# Patient Record
Sex: Female | Born: 1950
Health system: Southern US, Community
[De-identification: ages and names within clinical notes are randomized; demographics above are authoritative.]

## PROBLEM LIST (undated history)

## (undated) DIAGNOSIS — K5730 Diverticulosis of large intestine without perforation or abscess without bleeding: Secondary | ICD-10-CM

## (undated) DIAGNOSIS — K219 Gastro-esophageal reflux disease without esophagitis: Secondary | ICD-10-CM

## (undated) DIAGNOSIS — R739 Hyperglycemia, unspecified: Secondary | ICD-10-CM

## (undated) DIAGNOSIS — H269 Unspecified cataract: Secondary | ICD-10-CM

## (undated) DIAGNOSIS — M1990 Unspecified osteoarthritis, unspecified site: Secondary | ICD-10-CM

## (undated) DIAGNOSIS — G4730 Sleep apnea, unspecified: Secondary | ICD-10-CM

## (undated) DIAGNOSIS — J45991 Cough variant asthma: Secondary | ICD-10-CM

## (undated) DIAGNOSIS — M810 Age-related osteoporosis without current pathological fracture: Secondary | ICD-10-CM

## (undated) DIAGNOSIS — J302 Other seasonal allergic rhinitis: Secondary | ICD-10-CM

## (undated) DIAGNOSIS — I10 Essential (primary) hypertension: Secondary | ICD-10-CM

## (undated) DIAGNOSIS — E789 Disorder of lipoprotein metabolism, unspecified: Secondary | ICD-10-CM

## (undated) HISTORY — DX: Unspecified cataract: H26.9

## (undated) HISTORY — DX: Diverticulosis of large intestine without perforation or abscess without bleeding: K57.30

## (undated) HISTORY — DX: Disorder of lipoprotein metabolism, unspecified: E78.9

## (undated) HISTORY — PX: COLPOSCOPY: SHX161

## (undated) HISTORY — DX: Age-related osteoporosis without current pathological fracture: M81.0

## (undated) HISTORY — DX: Hyperglycemia, unspecified: R73.9

## (undated) HISTORY — DX: Other seasonal allergic rhinitis: J30.2

## (undated) HISTORY — DX: Gastro-esophageal reflux disease without esophagitis: K21.9

## (undated) HISTORY — DX: Unspecified osteoarthritis, unspecified site: M19.90

## (undated) HISTORY — DX: Sleep apnea, unspecified: G47.30

## (undated) HISTORY — PX: TUBAL LIGATION: SHX77

## (undated) HISTORY — PX: COLONOSCOPY: SHX174

## (undated) HISTORY — DX: Essential (primary) hypertension: I10

## (undated) HISTORY — PX: ROTATOR CUFF REPAIR: SHX139

## (undated) HISTORY — DX: Cough variant asthma: J45.991

---

## 1998-05-12 ENCOUNTER — Ambulatory Visit (HOSPITAL_BASED_OUTPATIENT_CLINIC_OR_DEPARTMENT_OTHER): Admission: RE | Admit: 1998-05-12 | Discharge: 1998-05-12 | Payer: Self-pay | Admitting: Orthopaedic Surgery

## 1998-09-02 ENCOUNTER — Other Ambulatory Visit: Admission: RE | Admit: 1998-09-02 | Discharge: 1998-09-02 | Payer: Self-pay | Admitting: Family Medicine

## 1999-10-13 ENCOUNTER — Other Ambulatory Visit: Admission: RE | Admit: 1999-10-13 | Discharge: 1999-10-13 | Payer: Self-pay | Admitting: Family Medicine

## 1999-10-19 ENCOUNTER — Encounter: Admission: RE | Admit: 1999-10-19 | Discharge: 1999-10-19 | Payer: Self-pay | Admitting: Family Medicine

## 1999-10-19 ENCOUNTER — Encounter: Payer: Self-pay | Admitting: Family Medicine

## 1999-11-15 HISTORY — PX: ESOPHAGOGASTRODUODENOSCOPY: SHX1529

## 1999-11-30 LAB — HM COLONOSCOPY

## 2001-01-09 ENCOUNTER — Encounter: Payer: Self-pay | Admitting: Family Medicine

## 2001-01-09 ENCOUNTER — Other Ambulatory Visit: Admission: RE | Admit: 2001-01-09 | Discharge: 2001-01-09 | Payer: Self-pay | Admitting: Family Medicine

## 2001-01-09 ENCOUNTER — Encounter: Admission: RE | Admit: 2001-01-09 | Discharge: 2001-01-09 | Payer: Self-pay | Admitting: Family Medicine

## 2001-06-02 ENCOUNTER — Emergency Department (HOSPITAL_COMMUNITY): Admission: EM | Admit: 2001-06-02 | Discharge: 2001-06-02 | Payer: Self-pay

## 2002-01-11 ENCOUNTER — Other Ambulatory Visit: Admission: RE | Admit: 2002-01-11 | Discharge: 2002-01-11 | Payer: Self-pay | Admitting: Family Medicine

## 2002-09-26 ENCOUNTER — Encounter: Admission: RE | Admit: 2002-09-26 | Discharge: 2002-09-26 | Payer: Self-pay | Admitting: Family Medicine

## 2002-09-26 ENCOUNTER — Encounter: Payer: Self-pay | Admitting: Family Medicine

## 2003-09-18 ENCOUNTER — Encounter: Payer: Self-pay | Admitting: Family Medicine

## 2003-09-18 ENCOUNTER — Other Ambulatory Visit: Admission: RE | Admit: 2003-09-18 | Discharge: 2003-09-18 | Payer: Self-pay | Admitting: Family Medicine

## 2004-02-23 ENCOUNTER — Ambulatory Visit: Payer: Self-pay | Admitting: Family Medicine

## 2005-02-14 LAB — HM COLONOSCOPY: HM Colonoscopy: NORMAL

## 2005-02-16 ENCOUNTER — Encounter: Admission: RE | Admit: 2005-02-16 | Discharge: 2005-02-16 | Payer: Self-pay | Admitting: Internal Medicine

## 2005-03-03 ENCOUNTER — Ambulatory Visit: Payer: Self-pay | Admitting: Gastroenterology

## 2005-03-10 ENCOUNTER — Encounter: Admission: RE | Admit: 2005-03-10 | Discharge: 2005-03-10 | Payer: Self-pay | Admitting: Internal Medicine

## 2005-03-15 ENCOUNTER — Ambulatory Visit: Payer: Self-pay | Admitting: Gastroenterology

## 2006-02-28 ENCOUNTER — Encounter: Admission: RE | Admit: 2006-02-28 | Discharge: 2006-02-28 | Payer: Self-pay | Admitting: Internal Medicine

## 2006-04-08 ENCOUNTER — Emergency Department (HOSPITAL_COMMUNITY): Admission: EM | Admit: 2006-04-08 | Discharge: 2006-04-08 | Payer: Self-pay | Admitting: Emergency Medicine

## 2006-05-04 ENCOUNTER — Encounter: Admission: RE | Admit: 2006-05-04 | Discharge: 2006-05-04 | Payer: Self-pay | Admitting: Hematology and Oncology

## 2006-05-09 ENCOUNTER — Emergency Department (HOSPITAL_COMMUNITY): Admission: EM | Admit: 2006-05-09 | Discharge: 2006-05-09 | Payer: Self-pay | Admitting: Emergency Medicine

## 2006-06-10 ENCOUNTER — Emergency Department (HOSPITAL_COMMUNITY): Admission: EM | Admit: 2006-06-10 | Discharge: 2006-06-10 | Payer: Self-pay | Admitting: Emergency Medicine

## 2006-12-12 ENCOUNTER — Emergency Department (HOSPITAL_COMMUNITY): Admission: EM | Admit: 2006-12-12 | Discharge: 2006-12-12 | Payer: Self-pay | Admitting: Emergency Medicine

## 2007-02-28 ENCOUNTER — Ambulatory Visit (HOSPITAL_BASED_OUTPATIENT_CLINIC_OR_DEPARTMENT_OTHER): Admission: RE | Admit: 2007-02-28 | Discharge: 2007-02-28 | Payer: Self-pay | Admitting: Otolaryngology

## 2007-03-09 ENCOUNTER — Ambulatory Visit: Payer: Self-pay | Admitting: Internal Medicine

## 2007-03-21 ENCOUNTER — Encounter: Payer: Self-pay | Admitting: Family Medicine

## 2007-03-21 DIAGNOSIS — R945 Abnormal results of liver function studies: Secondary | ICD-10-CM | POA: Insufficient documentation

## 2007-03-21 DIAGNOSIS — I10 Essential (primary) hypertension: Secondary | ICD-10-CM | POA: Insufficient documentation

## 2007-03-21 DIAGNOSIS — K5730 Diverticulosis of large intestine without perforation or abscess without bleeding: Secondary | ICD-10-CM | POA: Insufficient documentation

## 2007-03-21 DIAGNOSIS — E7800 Pure hypercholesterolemia, unspecified: Secondary | ICD-10-CM

## 2007-03-21 DIAGNOSIS — J309 Allergic rhinitis, unspecified: Secondary | ICD-10-CM

## 2007-03-21 DIAGNOSIS — J329 Chronic sinusitis, unspecified: Secondary | ICD-10-CM

## 2007-03-21 DIAGNOSIS — E1169 Type 2 diabetes mellitus with other specified complication: Secondary | ICD-10-CM | POA: Insufficient documentation

## 2007-03-22 ENCOUNTER — Ambulatory Visit: Payer: Self-pay | Admitting: Family Medicine

## 2007-03-23 ENCOUNTER — Encounter: Payer: Self-pay | Admitting: Family Medicine

## 2007-03-28 ENCOUNTER — Encounter: Admission: RE | Admit: 2007-03-28 | Discharge: 2007-03-28 | Payer: Self-pay | Admitting: Internal Medicine

## 2007-03-28 ENCOUNTER — Encounter: Payer: Self-pay | Admitting: Family Medicine

## 2007-04-03 ENCOUNTER — Encounter: Payer: Self-pay | Admitting: Family Medicine

## 2007-05-08 ENCOUNTER — Telehealth: Payer: Self-pay | Admitting: Family Medicine

## 2007-05-16 ENCOUNTER — Encounter: Payer: Self-pay | Admitting: Family Medicine

## 2007-07-03 ENCOUNTER — Ambulatory Visit: Payer: Self-pay | Admitting: Family Medicine

## 2007-07-16 ENCOUNTER — Ambulatory Visit: Payer: Self-pay | Admitting: Family Medicine

## 2007-07-18 LAB — CONVERTED CEMR LAB
Basophils Absolute: 0 10*3/uL (ref 0.0–0.1)
Basophils Relative: 0 % (ref 0.0–1.0)
HCT: 37.6 % (ref 36.0–46.0)
Monocytes Absolute: 0.3 10*3/uL (ref 0.1–1.0)
Neutro Abs: 2.8 10*3/uL (ref 1.4–7.7)
RBC: 3.98 M/uL (ref 3.87–5.11)

## 2007-07-26 ENCOUNTER — Encounter: Payer: Self-pay | Admitting: Family Medicine

## 2007-08-06 ENCOUNTER — Encounter: Payer: Self-pay | Admitting: Family Medicine

## 2007-08-20 ENCOUNTER — Telehealth: Payer: Self-pay | Admitting: Family Medicine

## 2007-11-27 ENCOUNTER — Encounter: Payer: Self-pay | Admitting: Family Medicine

## 2007-11-27 ENCOUNTER — Ambulatory Visit: Payer: Self-pay | Admitting: Family Medicine

## 2007-11-27 ENCOUNTER — Other Ambulatory Visit: Admission: RE | Admit: 2007-11-27 | Discharge: 2007-11-27 | Payer: Self-pay | Admitting: Family Medicine

## 2007-11-27 DIAGNOSIS — R7303 Prediabetes: Secondary | ICD-10-CM

## 2007-11-27 DIAGNOSIS — E119 Type 2 diabetes mellitus without complications: Secondary | ICD-10-CM | POA: Insufficient documentation

## 2007-11-28 LAB — CONVERTED CEMR LAB
Albumin: 4.3 g/dL (ref 3.5–5.2)
Alkaline Phosphatase: 70 units/L (ref 39–117)
BUN: 5 mg/dL — ABNORMAL LOW (ref 6–23)
Basophils Absolute: 0 10*3/uL (ref 0.0–0.1)
Basophils Relative: 0.6 % (ref 0.0–3.0)
Cholesterol: 203 mg/dL (ref 0–200)
Creatinine, Ser: 0.7 mg/dL (ref 0.4–1.2)
Direct LDL: 86.7 mg/dL
Eosinophils Absolute: 0.1 10*3/uL (ref 0.0–0.7)
Eosinophils Relative: 3.2 % (ref 0.0–5.0)
Glucose, Bld: 85 mg/dL (ref 70–99)
HDL: 82.7 mg/dL (ref >39.0)
Hemoglobin: 13.6 g/dL (ref 12.0–15.0)
MCHC: 33.9 g/dL (ref 30.0–36.0)
Monocytes Absolute: 0.3 10*3/uL (ref 0.1–1.0)
Platelets: 266 10*3/uL (ref 150–400)
RBC: 4.19 M/uL (ref 3.87–5.11)
RDW: 12.1 % (ref 11.5–14.6)
TSH: 2.78 microintl units/mL (ref 0.35–5.50)
Total Bilirubin: 1.3 mg/dL — ABNORMAL HIGH (ref 0.3–1.2)
Total Protein: 7.7 g/dL (ref 6.0–8.3)
VLDL: 19 mg/dL (ref 0–40)
WBC: 3.7 10*3/uL — ABNORMAL LOW (ref 4.5–10.5)

## 2007-12-27 ENCOUNTER — Telehealth: Payer: Self-pay | Admitting: Family Medicine

## 2008-01-22 ENCOUNTER — Emergency Department (HOSPITAL_COMMUNITY): Admission: EM | Admit: 2008-01-22 | Discharge: 2008-01-22 | Payer: Self-pay | Admitting: Emergency Medicine

## 2008-01-29 ENCOUNTER — Ambulatory Visit: Payer: Self-pay | Admitting: Family Medicine

## 2008-01-29 DIAGNOSIS — D72819 Decreased white blood cell count, unspecified: Secondary | ICD-10-CM

## 2008-02-01 ENCOUNTER — Encounter (INDEPENDENT_AMBULATORY_CARE_PROVIDER_SITE_OTHER): Payer: Self-pay | Admitting: *Deleted

## 2008-02-04 LAB — CONVERTED CEMR LAB
Basophils Relative: 0.1 % (ref 0.0–3.0)
Eosinophils Relative: 3.9 % (ref 0.0–5.0)
HCT: 38.9 % (ref 36.0–46.0)
Lymphocytes Relative: 47.4 % — ABNORMAL HIGH (ref 12.0–46.0)
MCHC: 34.7 g/dL (ref 30.0–36.0)
Monocytes Absolute: 0.3 10*3/uL (ref 0.1–1.0)
Neutrophils Relative %: 41.1 % — ABNORMAL LOW (ref 43.0–77.0)
Platelets: 232 10*3/uL (ref 150–400)
RBC: 4.13 M/uL (ref 3.87–5.11)
WBC: 3.5 10*3/uL — ABNORMAL LOW (ref 4.5–10.5)

## 2008-02-29 ENCOUNTER — Ambulatory Visit: Payer: Self-pay | Admitting: Family Medicine

## 2008-03-18 ENCOUNTER — Ambulatory Visit: Payer: Self-pay | Admitting: Family Medicine

## 2008-03-26 ENCOUNTER — Ambulatory Visit: Payer: Self-pay | Admitting: Family Medicine

## 2008-04-03 ENCOUNTER — Encounter: Admission: RE | Admit: 2008-04-03 | Discharge: 2008-04-03 | Payer: Self-pay | Admitting: Family Medicine

## 2008-04-08 ENCOUNTER — Encounter (INDEPENDENT_AMBULATORY_CARE_PROVIDER_SITE_OTHER): Payer: Self-pay | Admitting: *Deleted

## 2008-07-29 ENCOUNTER — Ambulatory Visit: Payer: Self-pay | Admitting: Family Medicine

## 2008-07-30 ENCOUNTER — Ambulatory Visit: Payer: Self-pay | Admitting: Family Medicine

## 2008-07-30 DIAGNOSIS — J45991 Cough variant asthma: Secondary | ICD-10-CM

## 2008-07-30 LAB — CONVERTED CEMR LAB
Bilirubin, Direct: 0.1 mg/dL (ref 0.0–0.3)
Total Bilirubin: 1.1 mg/dL (ref 0.3–1.2)

## 2008-08-08 ENCOUNTER — Ambulatory Visit: Payer: Self-pay | Admitting: Family Medicine

## 2008-08-10 ENCOUNTER — Emergency Department (HOSPITAL_COMMUNITY): Admission: EM | Admit: 2008-08-10 | Discharge: 2008-08-10 | Payer: Self-pay | Admitting: Family Medicine

## 2008-08-12 ENCOUNTER — Ambulatory Visit: Payer: Self-pay | Admitting: Family Medicine

## 2008-09-16 ENCOUNTER — Ambulatory Visit: Payer: Self-pay | Admitting: Family Medicine

## 2008-09-16 ENCOUNTER — Encounter: Admission: RE | Admit: 2008-09-16 | Discharge: 2008-09-16 | Payer: Self-pay | Admitting: Family Medicine

## 2008-09-16 DIAGNOSIS — R599 Enlarged lymph nodes, unspecified: Secondary | ICD-10-CM | POA: Insufficient documentation

## 2008-09-17 LAB — CONVERTED CEMR LAB
Basophils Absolute: 0 10*3/uL (ref 0.0–0.1)
Basophils Relative: 0.9 % (ref 0.0–3.0)
Eosinophils Absolute: 0.3 10*3/uL (ref 0.0–0.7)
Eosinophils Relative: 5.6 % — ABNORMAL HIGH (ref 0.0–5.0)
HCT: 41.2 % (ref 36.0–46.0)
Lymphs Abs: 1.4 10*3/uL (ref 0.7–4.0)
Monocytes Absolute: 0.6 10*3/uL (ref 0.1–1.0)
Monocytes Relative: 10.9 % (ref 3.0–12.0)
Neutrophils Relative %: 55.8 % (ref 43.0–77.0)
RBC: 4.35 M/uL (ref 3.87–5.11)
WBC: 5.1 10*3/uL (ref 4.5–10.5)

## 2008-09-24 ENCOUNTER — Ambulatory Visit: Payer: Self-pay | Admitting: Family Medicine

## 2008-09-29 ENCOUNTER — Telehealth: Payer: Self-pay | Admitting: Family Medicine

## 2008-10-03 ENCOUNTER — Telehealth: Payer: Self-pay | Admitting: Family Medicine

## 2008-10-13 ENCOUNTER — Telehealth: Payer: Self-pay | Admitting: Family Medicine

## 2008-10-13 ENCOUNTER — Telehealth (INDEPENDENT_AMBULATORY_CARE_PROVIDER_SITE_OTHER): Payer: Self-pay | Admitting: Internal Medicine

## 2008-10-17 ENCOUNTER — Ambulatory Visit: Payer: Self-pay | Admitting: Internal Medicine

## 2008-10-17 DIAGNOSIS — G4730 Sleep apnea, unspecified: Secondary | ICD-10-CM | POA: Insufficient documentation

## 2008-10-24 ENCOUNTER — Encounter: Payer: Self-pay | Admitting: Family Medicine

## 2008-11-05 ENCOUNTER — Ambulatory Visit: Payer: Self-pay | Admitting: Family Medicine

## 2008-11-10 ENCOUNTER — Telehealth: Payer: Self-pay | Admitting: Family Medicine

## 2008-11-12 ENCOUNTER — Ambulatory Visit: Payer: Self-pay | Admitting: Family Medicine

## 2008-11-20 ENCOUNTER — Ambulatory Visit: Payer: Self-pay | Admitting: Internal Medicine

## 2008-12-08 ENCOUNTER — Ambulatory Visit: Payer: Self-pay | Admitting: Internal Medicine

## 2009-01-26 ENCOUNTER — Ambulatory Visit: Payer: Self-pay | Admitting: Family Medicine

## 2009-01-27 LAB — CONVERTED CEMR LAB
Basophils Absolute: 0 10*3/uL (ref 0.0–0.1)
Basophils Relative: 1 % (ref 0.0–3.0)
Eosinophils Absolute: 0.2 10*3/uL (ref 0.0–0.7)
Eosinophils Relative: 5.7 % — ABNORMAL HIGH (ref 0.0–5.0)
HCT: 42.3 % (ref 36.0–46.0)
HDL: 73.1 mg/dL (ref >39.00)
Hemoglobin: 14.1 g/dL (ref 12.0–15.0)
Hgb A1c MFr Bld: 6.1 % (ref 4.6–6.5)
Lymphs Abs: 2.1 10*3/uL (ref 0.7–4.0)
MCHC: 33.4 g/dL (ref 30.0–36.0)
MCV: 96.9 fL (ref 78.0–100.0)
Monocytes Absolute: 0.3 10*3/uL (ref 0.1–1.0)
Monocytes Relative: 7.9 % (ref 3.0–12.0)
Neutro Abs: 1.1 10*3/uL — ABNORMAL LOW (ref 1.4–7.7)
Platelets: 267 10*3/uL (ref 150.0–400.0)
RDW: 11.7 % (ref 11.5–14.6)
TSH: 4.53 microintl units/mL (ref 0.35–5.50)
Triglycerides: 60 mg/dL (ref 0.0–149.0)
VLDL: 12 mg/dL (ref 0.0–40.0)

## 2009-02-02 ENCOUNTER — Ambulatory Visit: Payer: Self-pay | Admitting: Family Medicine

## 2009-02-02 DIAGNOSIS — M545 Low back pain: Secondary | ICD-10-CM

## 2009-02-03 ENCOUNTER — Encounter: Admission: RE | Admit: 2009-02-03 | Discharge: 2009-02-03 | Payer: Self-pay | Admitting: Family Medicine

## 2009-03-06 ENCOUNTER — Telehealth: Payer: Self-pay | Admitting: Family Medicine

## 2009-03-16 ENCOUNTER — Telehealth: Payer: Self-pay | Admitting: Family Medicine

## 2009-03-19 ENCOUNTER — Ambulatory Visit: Payer: Self-pay | Admitting: Family Medicine

## 2009-03-23 ENCOUNTER — Ambulatory Visit: Payer: Self-pay | Admitting: Family Medicine

## 2009-03-30 ENCOUNTER — Telehealth: Payer: Self-pay | Admitting: Family Medicine

## 2009-04-07 ENCOUNTER — Encounter: Admission: RE | Admit: 2009-04-07 | Discharge: 2009-04-07 | Payer: Self-pay | Admitting: Family Medicine

## 2009-04-07 LAB — HM MAMMOGRAPHY: HM Mammogram: NORMAL

## 2009-04-08 ENCOUNTER — Encounter (INDEPENDENT_AMBULATORY_CARE_PROVIDER_SITE_OTHER): Payer: Self-pay | Admitting: *Deleted

## 2009-04-14 ENCOUNTER — Encounter: Payer: Self-pay | Admitting: Family Medicine

## 2009-05-20 ENCOUNTER — Ambulatory Visit: Payer: Self-pay | Admitting: Family Medicine

## 2009-05-20 DIAGNOSIS — M810 Age-related osteoporosis without current pathological fracture: Secondary | ICD-10-CM

## 2009-06-03 ENCOUNTER — Encounter: Payer: Self-pay | Admitting: Family Medicine

## 2009-07-01 ENCOUNTER — Encounter: Payer: Self-pay | Admitting: Family Medicine

## 2009-11-05 ENCOUNTER — Telehealth: Payer: Self-pay | Admitting: Family Medicine

## 2009-11-05 ENCOUNTER — Ambulatory Visit: Payer: Self-pay | Admitting: Family Medicine

## 2010-03-16 NOTE — Assessment & Plan Note (Signed)
Summary: FLU VACCINE  Nurse Visit   Allergies: 1)  ! Sulfa  Immunizations Administered:  Influenza Vaccine # 1:    Vaccine Type: Fluvax 3+    Site: left deltoid    Mfr: GlaxoSmithKline    Dose: 0.5 ml    Route: IM    Given by: Mervin Hack CMA (AAMA)    Exp. Date: 08/14/2010    Lot #: [id number]    VIS given: 09/08/09 version given November 05, 2009.  Flu Vaccine Consent Questions:    Do you have a history of severe allergic reactions to this vaccine? no    Any prior history of allergic reactions to egg and/or gelatin? no    Do you have a sensitivity to the preservative Thimersol? no    Do you have a past history of Guillan-Barre Syndrome? no    Do you currently have an acute febrile illness? no    Have you ever had a severe reaction to latex? no    Vaccine information given and explained to patient? yes    Are you currently pregnant? no  Orders Added: 1)  Flu Vaccine 85yrs + [90658] 2)  Admin 1st Vaccine [09811]

## 2010-03-16 NOTE — Assessment & Plan Note (Signed)
Summary: CPX/MK   Vital Signs:  Patient profile:   60 year old female Height:      62.25 inches Weight:      131 pounds BMI:     23.85 Temp:     98.1 degrees F oral Pulse rate:   76 / minute Pulse rhythm:   regular BP sitting:   130 / 85  (left arm) Cuff size:   regular  Vitals Entered By: Lowella Petties CMA April 03, 2009 10:37 AM) CC: 30 minute check up   History of Present Illness: here for health mt exam  is feeling fair in general   back pain still bothers her on and off mobic did help  tried some glucosamine - is helping some / is better when she walks  not exercising as much because of her back    wt is down 2 lb with bmi 23  lipids good last check with trig 60/ HDL 73 and LDL 97  bp is running a bit high today- unusual -- had a little headache this am  was off bp med for a few days-that could be why   hyperglycemia stable wtih AIC 6.1 is doing very well with her diet - really watches her sugar - uses some art sweetners  no sweets   colonosc due 11/11  pap was 09-- no abn ones since she was  very young  no gyn symptoms   mam 2/10 self exam   Td 03  flu shot is up to date  never had pneumovax   is having trouble with gas  knows what foods make her worse - beano does not help some carbonated beverages   a little blood in nose in am in the winter   never had dexa  is petite  one sister with osteopenia  Allergies: 1)  ! Sulfa 2)  * Allegra  Past History:  Past Medical History: Last updated: 10/17/2008 Allergic rhinitis Diverticulosis, colon Hypertension ?cough variant asthma borderline chol hyperglycemia -borderline DM GERD allergist--- Dr. Sharyn Lull ENT--Dr.  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 Eyes:  Denies blurring and eye irritation. CV:  Denies chest pain or discomfort, palpitations, shortness of breath with exertion, and swelling of feet. Resp:  Denies cough and wheezing. GI:  Complains of gas; denies abdominal pain, change in bowel habits, and indigestion. GU:  Denies abnormal vaginal bleeding, discharge, dysuria, and hematuria. MS:  Complains of low back pain and stiffness; denies cramps and muscle weakness. Derm:  Denies lesion(s), poor wound  healing, and rash. Neuro:  Denies headaches, numbness, and tingling. Psych:  Denies anxiety and depression. Endo:  Denies cold intolerance, excessive thirst, excessive urination, and heat intolerance. Heme:  Denies abnormal bruising and bleeding.  Physical Exam  General:  Well-developed,well-nourished,in no acute distress; alert,appropriate and cooperative throughout examination Head:  normocephalic, atraumatic, and no abnormalities observed.   Eyes:  vision grossly intact, pupils equal, pupils round, and pupils reactive to light.  no conjunctival pallor, injection or  icterus  Ears:  R ear normal and L ear normal.   Nose:  no nasal discharge.   Mouth:  pharynx pink and moist.   Neck:  1 cm mobile nt LN palp R posterior / L submandibular nl rom/ no thyromegally or JVD or bruits  Chest Wall:  No deformities, masses, or tenderness noted. Breasts:  No mass, nodules, thickening, tenderness, bulging, retraction, inflamation, nipple discharge or skin changes noted.   Lungs:  Normal respiratory effort, chest expands symmetrically. Lungs are clear to auscultation, no crackles or wheezes. Heart:  Normal rate and regular rhythm. S1 and S2 normal without gallop, murmur, click, rub or other extra sounds. Abdomen:  Bowel sounds positive,abdomen soft and non-tender without masses, organomegaly or hernias noted. no renal bruits  Msk:  No deformity or scoliosis noted of thoracic or lumbar spine.  no spinal tenderness nl rom spine and hips Pulses:  R and L carotid,radial,femoral,dorsalis pedis and posterior tibial pulses are full and equal bilaterally Extremities:  No clubbing, cyanosis, edema, or deformity noted with normal full range of motion of all joints.   Neurologic:  sensation intact to light touch, gait normal, and DTRs symmetrical and normal.   Skin:  Intact without suspicious lesions or rashes Cervical Nodes:  few isolated LN- see neck exam Axillary Nodes:  No palpable lymphadenopathy Inguinal Nodes:  No significant adenopathy Psych:  normal affect, talkative and pleasant    Impression & Recommendations:  Problem # 1:  HEALTH MAINTENANCE EXAM (ICD-V70.0) Assessment Comment Only reviewed health habits including diet, exercise and skin cancer prevention reviewed health maintenance list and family history labs rev  Problem # 2:  BACK PAIN, LUMBAR (ICD-724.2) Assessment: Unchanged intermittent- pt req further eval  no neurol symptoms  ref to Dr Patsy Lager Her updated medication list for this problem includes:    Tylenol Extra Strength 500 Mg Tabs  (Acetaminophen) ..... Otc as directed    Mobic 7.5 Mg Tabs (Meloxicam) .Marland Kitchen... 1 by mouth with food for 1 month for back pain  Problem # 3:  CERVICAL LYMPHADENOPATHY (ICD-785.6) Assessment: Deteriorated cervical LN bigger today -- ref to ENT mildly low wbc is unchanged  no infx or other symptoms Orders: ENT Referral (ENT)  Problem # 4:  DIABETES MELLITUS, BORDERLINE (ICD-790.29) Assessment: Unchanged  AIC is good - no changes disc healthy diet (low simple sugar/ choose complex carbs/ low sat fat) diet and exercise in detail   Labs Reviewed: Creat: 0.7 (11/27/2007)     Problem # 5:  HYPERCHOLESTEROLEMIA (ICD-272.0) Assessment: Unchanged  good numbers today- disc low sat fat diet - is compliant  Labs Reviewed: SGOT: 25 (01/26/2009)   SGPT: 26 (01/26/2009)   HDL:73.10 (01/26/2009), 82.7 (11/27/2007)  LDL:97 (01/26/2009), DEL (85/46/2703)  Chol:182 (01/26/2009), 203 (11/27/2007)  Trig:60.0 (01/26/2009), 95 (11/27/2007)  Problem # 6:  HYPERTENSION (ICD-401.9) Assessment: Unchanged  stable control on benicar lab reviewed enc more exercise when able  Her updated medication list for this problem includes:    Benicar Hct 40-12.5 Mg Tabs (Olmesartan medoxomil-hctz) .Marland Kitchen... Take  one by mouth daily  BP today: 130/85 Prior BP: 132/84 (02/02/2009)  Labs Reviewed: K+: 3.9 (11/27/2007) Creat: : 0.7 (11/27/2007)   Chol: 182 (01/26/2009)   HDL: 73.10 (01/26/2009)   LDL: 97 (01/26/2009)   TG: 60.0 (01/26/2009)  Problem # 7:  COUGH VARIANT ASTHMA (ICD-493.82) Assessment: Unchanged stable overal pneumovax today Her updated medication list for this problem includes:    Proventil Hfa 108 (90 Base) Mcg/act Aers (Albuterol sulfate) .Marland Kitchen... 2 puffs up to every 4 hours as needed wheeze/cough    Asmanex 30 Metered Doses 110 Mcg/inh Aepb (Mometasone furoate) .Marland Kitchen... Take 1 inhalation daily as needed for asthma symptoms  Complete Medication List: 1)  Calcium 600 Mg Tabs (Calcium) .... Take one by  mouth twice a day 2)  Benicar Hct 40-12.5 Mg Tabs (Olmesartan medoxomil-hctz) .... Take one by mouth daily 3)  Nasonex 50 Mcg/act Susp (Mometasone furoate) .... 2 sprays in each nostril daily as needed 4)  Vitamin D 400 Unit Caps (Cholecalciferol) .... Daily 5)  Astelin 137 Mcg/spray Soln (Azelastine hcl) .... 2 sprays in each nostril two times a day as needed 6)  Nexium 40 Mg Cpdr (Esomeprazole magnesium) .Marland Kitchen.. 1 by mouth once daily in am as needed 7)  Tylenol Extra Strength 500 Mg Tabs (Acetaminophen) .... Otc as directed 8)  Mucinex Nasal Spray Full Force 0.05 % Soln (Oxymetazoline hcl) .... Otc as directed 9)  Proventil Hfa 108 (90 Base) Mcg/act Aers (Albuterol sulfate) .... 2 puffs up to every 4 hours as needed wheeze/cough 10)  Allegra 180 Mg Tabs (Fexofenadine hcl) .Marland Kitchen.. 1 once daily for nasal congestion 11)  Asmanex 30 Metered Doses 110 Mcg/inh Aepb (Mometasone furoate) .... Take 1 inhalation daily as needed for asthma symptoms 12)  Mobic 7.5 Mg Tabs (Meloxicam) .Marland Kitchen.. 1 by mouth with food for 1 month for back pain 13)  Flexa Trim  .... Take 2 by mouth daily  Other Orders: Pneumococcal Vaccine (60454) Admin 1st Vaccine (09811) Admin 1st Vaccine [facility]) [phone]) Radiology Referral (Radiology) Radiology Referral (Radiology)  Patient Instructions: 1)  we will schedule mammogram and dexa at check out  2)  please schedule appt with Dr Patsy Lager for back pain  3)  we will refer you to ENT at check out  4)  pneumonia vaccine today  5)  get back to walking when you can  Prescriptions: NEXIUM 40 MG CPDR (ESOMEPRAZOLE MAGNESIUM) 1 by mouth once daily in am as needed  #90 x 3   Entered and Authorized by:   Judith Part MD   Signed by:   Judith Part MD on 03/19/2009   Method used:   Print then Give to Patient   RxID:   [id number]   Prior Medications (reviewed today): CALCIUM 600 MG  TABS (CALCIUM) take one by mouth twice a day BENICAR HCT 40-12.5 MG  TABS (OLMESARTAN  MEDOXOMIL-HCTZ) take one by mouth daily NASONEX 50 MCG/ACT  SUSP (MOMETASONE FUROATE) 2 sprays in each nostril daily as needed VITAMIN D 400 UNIT CAPS (CHOLECALCIFEROL) daily ASTELIN 137 MCG/SPRAY SOLN (AZELASTINE HCL) 2 sprays in each nostril two times a day as needed TYLENOL EXTRA STRENGTH 500 MG TABS (ACETAMINOPHEN) OTC as directed MUCINEX NASAL SPRAY FULL FORCE 0.05 % SOLN (OXYMETAZOLINE HCL) OTC as directed PROVENTIL HFA 108 (90 BASE) MCG/ACT AERS (ALBUTEROL SULFATE) 2 puffs up to every 4 hours as needed wheeze/cough ALLEGRA 180 MG TABS (FEXOFENADINE HCL) 1 once daily for nasal congestion ASMANEX 30 METERED DOSES 110 MCG/INH AEPB (MOMETASONE FUROATE) TAKE  1 INHALATION DAILY AS NEEDED FOR ASTHMA SYMPTOMS MOBIC 7.5 MG TABS (MELOXICAM) 1 by mouth with food for 1 month for back pain FLEXA TRIM () take 2 by mouth daily Current Allergies: ! SULFA * ALLEGRA     Pneumovax Vaccine    Vaccine Type: Pneumovax    Site: right deltoid    Mfr: Merck    Dose: 0.5 ml    Route: IM    Given by: Lowella Petties CMA    Exp. Date: 06/04/2010    Lot #: [id number]    VIS given: 09/12/95 version given March 19, 2009.

## 2010-03-16 NOTE — Progress Notes (Signed)
Summary: needs new scripts for fosamax, asmanex  Phone Note Refill Request Message from:  Fax from Pharmacy  Refills Requested: Medication #1:  ASMANEX 30 METERED DOSES 110 MCG/INH AEPB TAKE 1 INHALATION DAILY AS NEEDED FOR ASTHMA SYMPTOMS  Medication #2:  FOSAMAX 70 MG TABS take once weekly by mouth as directed. Faxed forms from [facility] are on your shelf.  Initial call taken by: Lowella Petties CMA,  November 05, 2009 3:14 PM  Follow-up for Phone Call        form done and in nurse in box  Follow-up by: Judith Part MD,  November 06, 2009 8:09 AM  Additional Follow-up for Phone Call Additional follow up Details #1::        completed forms faxed to [phone] as instructed.Lewanda Rife LPN  November 06, 2009 8:45 AM     New/Updated Medications: FOSAMAX 70 MG TABS (ALENDRONATE SODIUM) take once weekly by mouth as directed Prescriptions: FOSAMAX 70 MG TABS (ALENDRONATE SODIUM) take once weekly by mouth as directed  #3 months x 3   Entered and Authorized by:   Judith Part MD   Signed by:   Lewanda Rife LPN on 29/56/2130   Method used:   Historical   RxID:   [id number] ASMANEX 30 METERED DOSES 110 MCG/INH AEPB (MOMETASONE FUROATE) TAKE 1 INHALATION DAILY AS NEEDED FOR ASTHMA SYMPTOMS  #3 months x 3   Entered and Authorized by:   Judith Part MD   Signed by:   Lewanda Rife LPN on 32/44/0102   Method used:   Historical   RxID:   [id number]

## 2010-03-16 NOTE — Consult Note (Signed)
Summary: [facility]  [facility]   Imported By: Lanelle Bal 04/22/2009 09:15:32  _____________________________________________________________________  External Attachment:    Type:   Image     Comment:   External Document

## 2010-03-16 NOTE — Letter (Signed)
Summary: [facility]  [facility]   Imported By: Lanelle Bal 06/16/2009 12:33:54  _____________________________________________________________________  External Attachment:    Type:   Image     Comment:   External Document
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
## 2010-03-16 NOTE — Progress Notes (Signed)
Summary: Request Benicar HCT 40mg /12.5mg  samples  Phone Note Call from Patient Call back at [phone]   Caller: Patient Call For: Judith Part MD Summary of Call: Pt checked with mail order pharmacy and they are reviewing her meds for possible drug interactions. Med should be mailed in 2-3 days. Pt request 2 weeks of Benicar HCT 40mg -12.5mg  samples. (we do have samples available if you OK for pt to have them).Please advise.  Initial call taken by: Lewanda Rife LPN,  March 30, 2009 9:44 AM  Follow-up for Phone Call        ok to give her samples  Follow-up by: Judith Part MD,  March 30, 2009 11:16 AM  Additional Follow-up for Phone Call Additional follow up Details #1::        Patient notified as instructed by telephone. Pt will pick up samples today. Samples are in a bag on shelf in front office.Lewanda Rife LPN  March 30, 2009 11:25 AM      Appended Document: Request Benicar HCT 40mg /12.5mg  samples Benicar HCT 40mg /12.5mg  # 14 samples were left at front desk for pt.

## 2010-03-16 NOTE — Progress Notes (Signed)
Summary: Samples of Benicar  Phone Note Call from Patient Call back at Home Phone [phone]   Caller: Patient Call For: Judith Part MD Summary of Call: Patient is out of Benicar, she is awaiting delivery from mail order pharmacy.  We do have samples.  Is this ok? Initial call taken by: Linde Gillis CMA Duncan Dull),  March 16, 2009 4:35 PM  Follow-up for Phone Call        that is fine - to get her by until mail order  Follow-up by: Judith Part MD,  March 16, 2009 4:46 PM  Additional Follow-up for Phone Call Additional follow up Details #1::        2 sample boxes of #7 each given. Additional Follow-up by: Lowella Petties CMA,  March 16, 2009 5:04 PM

## 2010-03-16 NOTE — Letter (Signed)
Summary: [facility]  [facility]   Imported By: Lanelle Bal 07/10/2009 12:12:47  _____________________________________________________________________  External Attachment:    Type:   Image     Comment:   External Document

## 2010-03-16 NOTE — Assessment & Plan Note (Signed)
Summary: BACK PAIN PER DR TOWER/[facility]   Vital Signs:  Patient profile:   60 year old female Weight:      132 pounds BMI:     24.04 Temp:     97.9 degrees F oral Pulse rate:   60 / minute Pulse rhythm:   regular BP sitting:   132 / 74  (left arm) Cuff size:   regular  Vitals Entered By: Linde Gillis CMA Duncan Dull) (March 23, 2009 11:03 AM) CC: back pain, Back Pain   History of Present Illness: 60 year old female seen at the request of Dr. Milinda Antis for evaluation of back pain:  Pleasant lady who has been active throughout her whole life, and now she has a weight of 132 pounds and a BMI of 24.  Chest and having some intermittent back pain, and she did see Dr. Milinda Antis, recently had some complaints of back pain, and suggested followup in discussion with me.  Currently she is asymptomatic with regards to her back.  X-rays the lumbar spine reviewed, AP and lateral including oblique views reviewed personally. There is no significant degenerative disc disease present. Patient does have severe facet arthropathy in multiple levels including greatest and most severe in L4-S1. Proper alignment. There is some mild SI arthropathy as well.  She denies any numbness, tingling, and she has been able to walk without problem.  Back Pain History:      The patient's back pain has been present for < 6 weeks.  She states this is not work related.  She states that she has had a prior history of back pain.  The patient has not had any recent physical therapy for her back pain.    Critical Exclusionary Diagnosis Criteria (CEDC) for Back Pain:      The patient denies a history of previous trauma.  She has no prior history of spinal surgery.  There are no symptoms to suggest infection or cauda equina.    Allergies: 1)  ! Sulfa  Past History:  Past medical, surgical, family and social histories (including risk factors) reviewed, and no changes noted (except as noted below).  Past Medical History: Reviewed  history from 10/17/2008 and no changes required. Allergic rhinitis Diverticulosis, colon Hypertension ?cough variant asthma borderline chol hyperglycemia -borderline DM GERD allergist--- Dr. Sharyn Lull ENT--Dr.  Annalee Genta Sleep Apnea  Past Surgical History: Reviewed history from 03/21/2007 and no changes required. Head CT (09/1996) Tubal ligation Colposcopy (1988) Diverticulosis (11/1999) EGD- neg (11/1999) Colonoscopy- divertics (11/1999) Rotator cuff repair- right  Family History: Reviewed history from 03/19/2009 and no changes required. Father: HTN, DM- deceased Mother: died age 36- cerebral hemorrhage Siblings: 8 brothers, 1 sister- 1 brother deceased from DM brother prostate cancer, DM brother prosate cancer  now 6 of 8 brothers with prostate cancer  sister with bone loss  Social History: Reviewed history from 10/17/2008 and no changes required. 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 Otherwise the pertinent positives of the ROS are noted above.    Physical Exam  General:  GEN: Well-developed,well-nourished,in no acute distress; alert,appropriate and cooperative throughout examination HEENT: Normocephalic and atraumatic without obvious abnormalities.  No apparent alopecia or balding. Ears, externally no deformities PULM: Breathing comfortably in no respiratory distress EXT: No clubbing, cyanosis, or edema PSYCH: Normally interactive. Cooperative during the interview. Pleasant. Friendly and conversant. Not anxious or depressed appearing. Normal, full affect.  Msk:  Normal Greater  trochanteric bursae Full hip ROM Negative Faber Negative Reverse Faber Sciatic Notches:  Sensation to Gross touch WNL Sensation to pinpricnk WNL DTR 2+ knee and ankle no clonus DP and PT pulses are normal B   Hip abduction 5/5  Low Back Pain Physical Exam:    Inspection-deformity:     No    Palpation-spinal tenderness:   No    Motor Exam/Strength:         Left Ankle Dorsiflexion (L5,L4):     normal       Left Great Toe Dorsiflexion (L5,L4):     normal       Left Heel Walk (L5,some L4):     normal       Left Single Squat & Rise-Quads (L4):   normal       Left Toe Walk-calf (S1):       normal       Right Ankle Dorsiflexion (L5,L4):     normal       Right Great Toe Dorsiflexion (L5,L4):       normal       Right Heel Walk (L5,some L4):     normal       Right Single Squat & Rise Quads (L4):   normal       Right Toe Walk-calf (S1):       normal    Sensory Exam/Pinprick:        Left Medial Foot (L4):   normal       Left Dorsal Foot (L5):   normal       Left Lateral Foot (S1):   normal       Right Medial Foot (L4):   normal       Right Dorsal Foot (L5):   normal       Right Lateral Foot (S1):   normal    Reflexes:        Left Knee Jerk (L4):     normal       Left Ankle Reflex (S1):   normal       Right Knee Jerk:     normal       Right Ankle Reflex (S1):   normal    Straight Leg Raise (SLR):       Left Straight Leg Raise (SLR):   negative       Right Straight Leg Raise (SLR):   negative   Impression & Recommendations:  Problem # 1:  BACK PAIN, LUMBAR (ICD-724.2) I suspect most of her symptoms are from lumbar facet arthropathy, and now she is doing much better.  I suspect she will have intermittent flares, and I think the Tylenol, Mobic, and alteration of her activities including heat and massage and other conservative measures is most appropriate.  I spent majority of my visit going over her physical activity, and reviewed the set of core conditioning and hip stability  program for overall back maintenance from [place] state. She is fit and strong, and I believe that she can do a relatively advanced core program.  cc: Dr. Milinda Antis  Her updated medication list for this problem includes:    Tylenol Extra Strength 500 Mg Tabs (Acetaminophen) ..... Otc as directed    Mobic 7.5  Mg Tabs (Meloxicam) .Marland Kitchen... 1 by mouth with food for 1 month for back pain  Complete Medication List: 1)  Calcium 600 Mg Tabs (Calcium) .... Take one by mouth twice a day 2)  Benicar Hct 40-12.5 Mg Tabs (Olmesartan medoxomil-hctz) .... Take one by mouth daily 3)  Nasonex 50 Mcg/act Susp (Mometasone furoate) .... 2 sprays in each nostril daily as needed 4)  Vitamin D 400 Unit Caps (Cholecalciferol) .... Daily 5)  Astelin 137 Mcg/spray Soln (Azelastine hcl) .... 2 sprays in each nostril two times a day as needed 6)  Nexium 40 Mg Cpdr (Esomeprazole magnesium) .Marland Kitchen.. 1 by mouth once daily in am as needed 7)  Tylenol Extra Strength 500 Mg Tabs (Acetaminophen) .... Otc as directed 8)  Mucinex Nasal Spray Full Force 0.05 % Soln (Oxymetazoline hcl) .... Otc as directed 9)  Proventil Hfa 108 (90 Base) Mcg/act Aers (Albuterol sulfate) .... 2 puffs up to every 4 hours as needed wheeze/cough 10)  Allegra 180 Mg Tabs (Fexofenadine hcl) .Marland Kitchen.. 1 once daily for nasal congestion 11)  Asmanex 30 Metered Doses 110 Mcg/inh Aepb (Mometasone furoate) .... Take 1 inhalation daily as needed for asthma symptoms 12)  Mobic 7.5 Mg Tabs (Meloxicam) .Marland Kitchen.. 1 by mouth with food for 1 month for back pain 13)  Flexa Min  .... Take one tablet by mouth daily  Current Allergies (reviewed today): ! SULFA

## 2010-03-16 NOTE — Progress Notes (Signed)
Summary: Rx Benicar HCT  Phone Note Refill Request Message from:  [facility] on March 06, 2009 10:07 AM  Refills Requested: Medication #1:  BENICAR HCT 40-12.5 MG  TABS take one by mouth daily [facility] is requesting a new Rx for Benicar HCT.  Form in your IN box   Method Requested: Fax to Mail Away Pharmacy Initial call taken by: Linde Gillis CMA Duncan Dull),  March 06, 2009 10:08 AM  Follow-up for Phone Call        form done and in nurse in box  Follow-up by: Judith Part MD,  March 06, 2009 1:18 PM  Additional Follow-up for Phone Call Additional follow up Details #1::        Form faxed. Additional Follow-up by: Lowella Petties CMA,  March 06, 2009 2:49 PM    New/Updated Medications: BENICAR HCT 40-12.5 MG  TABS (OLMESARTAN MEDOXOMIL-HCTZ) take one by mouth daily Prescriptions: BENICAR HCT 40-12.5 MG  TABS (OLMESARTAN MEDOXOMIL-HCTZ) take one by mouth daily  #90 x 3   Entered and Authorized by:   Judith Part MD   Signed by:   Lowella Petties CMA on 03/06/2009   Method used:   Historical   RxID:   [id number]

## 2010-03-16 NOTE — Letter (Signed)
Summary: Results Follow up Letter  [place] [facility]  [address] [place], [place] [address]   Phone: [phone]  Fax: [phone]    04/08/2009 MRN: [id number]    Jasmine Rollins [address] [place], [place]  [address]    Dear Ms. Merriweather,  The following are the results of your recent test(s):  Test         Result    Pap Smear:        Normal _____  Not Normal _____ Comments: ______________________________________________________ Cholesterol: LDL(Bad cholesterol):         Your goal is less than:         HDL (Good cholesterol):       Your goal is more than: Comments:  ______________________________________________________ Mammogram:        Normal _____  Not Normal _____ Comments:  ___________________________________________________________________ Hemoccult:        Normal _____  Not normal _______ Comments:    _____________________________________________________________________ Other Tests:   Bone Density Test:  Dexa shows osteoporosis with low score in the spine.     Please follow up  this spring when able to discuss treatment options.        We routinely do not discuss normal results over the telephone.  If you desire a copy of the results, or you have any questions about this information we can discuss them at your next office visit.   Sincerely,   Marne A. Milinda Antis, M.D.  MAT:lsf

## 2010-04-16 ENCOUNTER — Other Ambulatory Visit: Payer: Self-pay | Admitting: Family Medicine

## 2010-04-16 DIAGNOSIS — Z1231 Encounter for screening mammogram for malignant neoplasm of breast: Secondary | ICD-10-CM

## 2010-04-21 ENCOUNTER — Ambulatory Visit
Admission: RE | Admit: 2010-04-21 | Discharge: 2010-04-21 | Disposition: A | Discharge status: Home / Self Care | Source: Ambulatory Visit | Attending: Family Medicine | Admitting: Family Medicine

## 2010-04-21 DIAGNOSIS — Z1231 Encounter for screening mammogram for malignant neoplasm of breast: Secondary | ICD-10-CM

## 2010-04-23 ENCOUNTER — Encounter (INDEPENDENT_AMBULATORY_CARE_PROVIDER_SITE_OTHER): Payer: Self-pay | Admitting: *Deleted

## 2010-04-24 ENCOUNTER — Encounter: Payer: Self-pay | Admitting: Family Medicine

## 2010-04-24 DIAGNOSIS — M810 Age-related osteoporosis without current pathological fracture: Secondary | ICD-10-CM | POA: Insufficient documentation

## 2010-04-24 DIAGNOSIS — G4730 Sleep apnea, unspecified: Secondary | ICD-10-CM | POA: Insufficient documentation

## 2010-04-24 DIAGNOSIS — I10 Essential (primary) hypertension: Secondary | ICD-10-CM | POA: Insufficient documentation

## 2010-04-24 DIAGNOSIS — K219 Gastro-esophageal reflux disease without esophagitis: Secondary | ICD-10-CM | POA: Insufficient documentation

## 2010-04-24 DIAGNOSIS — R739 Hyperglycemia, unspecified: Secondary | ICD-10-CM | POA: Insufficient documentation

## 2010-04-24 DIAGNOSIS — E789 Disorder of lipoprotein metabolism, unspecified: Secondary | ICD-10-CM | POA: Insufficient documentation

## 2010-04-24 DIAGNOSIS — K5730 Diverticulosis of large intestine without perforation or abscess without bleeding: Secondary | ICD-10-CM | POA: Insufficient documentation

## 2010-04-24 DIAGNOSIS — J309 Allergic rhinitis, unspecified: Secondary | ICD-10-CM | POA: Insufficient documentation

## 2010-04-27 NOTE — Letter (Signed)
Summary: Results Follow up Letter  [place] [facility]  [address] [place], [place] [address]   Phone: [phone]  Fax: [phone]    04/23/2010 MRN: [id number]    Jasmine Rollins [address] [place], [place]  [address]    Dear Ms. Kurek,  The following are the results of your recent test(s):  Test         Result    Pap Smear:        Normal _____  Not Normal _____ Comments: ______________________________________________________ Cholesterol: LDL(Bad cholesterol):         Your goal is less than:         HDL (Good cholesterol):       Your goal is more than: Comments:  ______________________________________________________ Mammogram:        Normal __X___  Not Normal _____ Comments:  Yearly follow up is recommended.   ___________________________________________________________________ Hemoccult:        Normal _____  Not normal _______ Comments:    _____________________________________________________________________ Other Tests:    We routinely do not discuss normal results over the telephone.  If you desire a copy of the results, or you have any questions about this information we can discuss them at your next office visit.   Sincerely,    Marne A. Milinda Antis, M.D.  MAT:lsf

## 2010-06-03 ENCOUNTER — Telehealth: Payer: Self-pay | Admitting: Family Medicine

## 2010-06-03 DIAGNOSIS — M810 Age-related osteoporosis without current pathological fracture: Secondary | ICD-10-CM

## 2010-06-03 DIAGNOSIS — Z0000 Encounter for general adult medical examination without abnormal findings: Secondary | ICD-10-CM | POA: Insufficient documentation

## 2010-06-03 DIAGNOSIS — E7800 Pure hypercholesterolemia, unspecified: Secondary | ICD-10-CM

## 2010-06-03 DIAGNOSIS — I10 Essential (primary) hypertension: Secondary | ICD-10-CM

## 2010-06-03 DIAGNOSIS — R7309 Other abnormal glucose: Secondary | ICD-10-CM

## 2010-06-03 NOTE — Telephone Encounter (Signed)
Message copied by Roxy Manns on Thu Jun 03, 2010  4:40 PM ------      Message from: Liane Comber      Created: Thu Jun 03, 2010 11:56 AM      Regarding: Cpx labs tues 5/1       Please order  future cpx labs for pt's upcomming lab appt.      Thanks      Rodney Booze

## 2010-06-15 ENCOUNTER — Other Ambulatory Visit: Payer: Self-pay

## 2010-06-17 ENCOUNTER — Other Ambulatory Visit (INDEPENDENT_AMBULATORY_CARE_PROVIDER_SITE_OTHER)

## 2010-06-17 DIAGNOSIS — M810 Age-related osteoporosis without current pathological fracture: Secondary | ICD-10-CM

## 2010-06-17 DIAGNOSIS — Z0000 Encounter for general adult medical examination without abnormal findings: Secondary | ICD-10-CM

## 2010-06-17 DIAGNOSIS — E7800 Pure hypercholesterolemia, unspecified: Secondary | ICD-10-CM

## 2010-06-17 DIAGNOSIS — R7309 Other abnormal glucose: Secondary | ICD-10-CM

## 2010-06-17 DIAGNOSIS — I10 Essential (primary) hypertension: Secondary | ICD-10-CM

## 2010-06-17 LAB — COMPREHENSIVE METABOLIC PANEL
AST: 23 U/L (ref 0–37)
Albumin: 4.1 g/dL (ref 3.5–5.2)
BUN: 10 mg/dL (ref 6–23)
Calcium: 9.9 mg/dL (ref 8.4–10.5)
Chloride: 104 mEq/L (ref 96–112)
Potassium: 4.5 mEq/L (ref 3.5–5.1)
Sodium: 140 mEq/L (ref 135–145)
Total Protein: 7.1 g/dL (ref 6.0–8.3)

## 2010-06-17 LAB — CBC WITH DIFFERENTIAL/PLATELET
Basophils Relative: 0.6 % (ref 0.0–3.0)
Eosinophils Absolute: 0.1 10*3/uL (ref 0.0–0.7)
Eosinophils Relative: 3.9 % (ref 0.0–5.0)
Lymphocytes Relative: 51.1 % — ABNORMAL HIGH (ref 12.0–46.0)
MCHC: 33.8 g/dL (ref 30.0–36.0)
Neutrophils Relative %: 37.4 % — ABNORMAL LOW (ref 43.0–77.0)
RBC: 4.23 Mil/uL (ref 3.87–5.11)
WBC: 3.5 10*3/uL — ABNORMAL LOW (ref 4.5–10.5)

## 2010-06-17 LAB — LIPID PANEL
LDL Cholesterol: 110 mg/dL — ABNORMAL HIGH (ref 0–99)
Total CHOL/HDL Ratio: 3
VLDL: 14.8 mg/dL (ref 0.0–40.0)

## 2010-06-17 LAB — HEMOGLOBIN A1C: Hgb A1c MFr Bld: 6.4 % (ref 4.6–6.5)

## 2010-06-18 LAB — VITAMIN D 25 HYDROXY (VIT D DEFICIENCY, FRACTURES): Vit D, 25-Hydroxy: 48 ng/mL (ref 30–89)

## 2010-06-22 ENCOUNTER — Ambulatory Visit (INDEPENDENT_AMBULATORY_CARE_PROVIDER_SITE_OTHER): Admitting: Family Medicine

## 2010-06-22 ENCOUNTER — Other Ambulatory Visit (HOSPITAL_COMMUNITY)
Admission: RE | Admit: 2010-06-22 | Discharge: 2010-06-22 | Disposition: A | Discharge status: Home / Self Care | Source: Ambulatory Visit | Attending: Family Medicine | Admitting: Family Medicine

## 2010-06-22 ENCOUNTER — Encounter: Payer: Self-pay | Admitting: Family Medicine

## 2010-06-22 DIAGNOSIS — E7800 Pure hypercholesterolemia, unspecified: Secondary | ICD-10-CM

## 2010-06-22 DIAGNOSIS — H40009 Preglaucoma, unspecified, unspecified eye: Secondary | ICD-10-CM | POA: Insufficient documentation

## 2010-06-22 DIAGNOSIS — I10 Essential (primary) hypertension: Secondary | ICD-10-CM

## 2010-06-22 DIAGNOSIS — Z0000 Encounter for general adult medical examination without abnormal findings: Secondary | ICD-10-CM

## 2010-06-22 DIAGNOSIS — Z1159 Encounter for screening for other viral diseases: Secondary | ICD-10-CM | POA: Insufficient documentation

## 2010-06-22 DIAGNOSIS — Z01419 Encounter for gynecological examination (general) (routine) without abnormal findings: Secondary | ICD-10-CM | POA: Insufficient documentation

## 2010-06-22 DIAGNOSIS — Z23 Encounter for immunization: Secondary | ICD-10-CM

## 2010-06-22 DIAGNOSIS — Z1211 Encounter for screening for malignant neoplasm of colon: Secondary | ICD-10-CM | POA: Insufficient documentation

## 2010-06-22 DIAGNOSIS — R7309 Other abnormal glucose: Secondary | ICD-10-CM

## 2010-06-22 DIAGNOSIS — M810 Age-related osteoporosis without current pathological fracture: Secondary | ICD-10-CM

## 2010-06-22 DIAGNOSIS — D72819 Decreased white blood cell count, unspecified: Secondary | ICD-10-CM

## 2010-06-22 MED ORDER — OLMESARTAN MEDOXOMIL-HCTZ 40-12.5 MG PO TABS: 1.0000 | ORAL_TABLET | Freq: Every day | ORAL | Status: DC

## 2010-06-22 NOTE — Assessment & Plan Note (Signed)
This is very well controlled with benicar hct No changes  Labs reviewed

## 2010-06-22 NOTE — Progress Notes (Signed)
Subjective:    Patient ID: Jasmine Rollins, female    DOB: [birth year], 60 y.o.   MRN: [id number]  HPI Here for health mt exam and to review chronic medical problems  Is feeling good overall  Has had to use inhaler with pollen lately     Wt is up 2 lb with good bmi of 24 Works to maintain her weight   Zoster status-- has not --is interested in that - will call her insurance  colonosc 01 -is due for screening colonoscopy - will go ahead and refer   Pap 09 - due for 3 year pap  No gyn problems -- no bleeding or d/c    Td was 03 Wants to update that today    Mam 3/12- normal  Self exam -- no lumps or problems   dexa -- OP  Last year -- so not due yet  Vit D level 48 Ca and d intake -- is good about that , does not miss doses  Also exercises - walking   HTn is in good control at 112/78 today  Lipids great with HDL of 70 and LDL 110 Lab Results  Component Value Date   CHOL 195 06/17/2010   CHOL 182 01/26/2009   CHOL 203* 11/27/2007   Lab Results  Component Value Date   HDL 70.30 06/17/2010   HDL 73.10 01/26/2009   HDL 82.7 11/27/2007   Lab Results  Component Value Date   LDLCALC 110* 06/17/2010   LDLCALC 97 01/26/2009   Lab Results  Component Value Date   TRIG 74.0 06/17/2010   TRIG 60.0 01/26/2009   TRIG 95 11/27/2007   Lab Results  Component Value Date   CHOLHDL 3 06/17/2010   CHOLHDL 2 01/26/2009   CHOLHDL 2.5 CALC 11/27/2007   Lab Results  Component Value Date   LDLDIRECT 86.7 11/27/2007   lip   Hyperglycemia a is worse  a1c is 6.4 up from 6.1 Diet -- has not been as good - , but does use stevia -- will do that more  Loves sweets and sugar  Last eye exam was ok - uses drops for glaucoma suspect   Wbc is stable at 3.5-- is intermittently low for years   Past Medical History  Diagnosis Date  . Allergic rhinitis   . Diverticulosis of colon   . HTN (hypertension)   . Cough variant asthma   . Borderline high cholesterol   . Hyperglycemia    borderline DM  . GERD (gastroesophageal reflux disease)   . OP (osteoporosis)   . Sleep apnea     History   Social History  . Marital Status: Married    Spouse Name: N/A    Number of Children: 1  . Years of Education: N/A   Occupational History  . Customer Service    Social History Main Topics  . Smoking status: Never Smoker   . Smokeless tobacco: Not on file   Comment: Remote 2nd hand exposure  . Alcohol Use: Yes     Rare  . Drug Use: Not on file  . Sexually Active: Not on file   Other Topics Concern  . Not on file   Social History Narrative  . No narrative on file    Past Surgical History  Procedure Date  . Tubal ligation   . Colposcopy   . Rotator cuff repair     right  . Esophagogastroduodenoscopy 10/01    Negative    Allergies  Allergen Reactions  .  Sulfonamide Derivatives     Family History  Problem Relation Age of Onset  . Hypertension Father   . Diabetes Father   . Diabetes Brother   . Diabetes Brother   . Cancer Brother     prostate  . Cancer Brother     prostate  . Cancer Brother     prostate  . Cancer Brother     prostate  . Cancer Brother     prostate  . Cancer Brother     prostate         Review of Systems Review of Systems  Constitutional: Negative for fever, appetite change, fatigue and unexpected weight change.  Eyes: Negative for pain and visual disturbance.  Respiratory: Negative for cough and shortness of breath.  , pos for occas wheeze  Cardiovascular: Negative for cp  Gastrointestinal: Negative for nausea, diarrhea and constipation.  Genitourinary: Negative for urgency and frequency.  Skin: Negative for pallor.  Neurological: Negative for weakness, light-headedness, numbness and headaches.  Hematological: Negative for adenopathy. Does not bruise/bleed easily.  Psychiatric/Behavioral: Negative for dysphoric mood. The patient is not nervous/anxious.          Objective:   Physical Exam  Constitutional: She appears  well-developed and well-nourished. No distress.  HENT:  Head: Normocephalic and atraumatic.  Right Ear: External ear normal.  Left Ear: External ear normal.  Nose: Nose normal.  Eyes: Conjunctivae and EOM are normal. Pupils are equal, round, and reactive to light.  Neck: Normal range of motion. Neck supple. No JVD present. Carotid bruit is not present. No thyromegaly present.  Cardiovascular: Normal rate, regular rhythm and normal heart sounds.   Pulmonary/Chest: Effort normal and breath sounds normal. No respiratory distress. She has no wheezes. She has no rales.  Abdominal: Soft. Bowel sounds are normal. She exhibits no abdominal bruit and no mass. There is no tenderness.  Genitourinary: Vagina normal and uterus normal. No breast swelling, tenderness, discharge or bleeding. No vaginal discharge found.  Musculoskeletal: Normal range of motion. She exhibits no edema and no tenderness.  Lymphadenopathy:    She has no cervical adenopathy.  Neurological: She is alert. She has normal reflexes. Coordination normal.  Skin: Skin is warm and dry. No rash noted. No erythema. No pallor.  Psychiatric: She has a normal mood and affect.          Assessment & Plan:

## 2010-06-22 NOTE — Assessment & Plan Note (Signed)
3 year exam with pap No problems

## 2010-06-22 NOTE — Assessment & Plan Note (Signed)
This is stable and asymptomatic /will continue to monitor with wbc 3.5

## 2010-06-22 NOTE — Assessment & Plan Note (Signed)
utd dexa for another year  On fosamax Good D level Urged to keep up exercise

## 2010-06-22 NOTE — Assessment & Plan Note (Signed)
Reviewed health habits including diet and exercise and skin cancer prevention Also reviewed health mt list, fam hx and immunizations   tdap today Considering her zostavax with ins  Ref for colonosc

## 2010-06-22 NOTE — Patient Instructions (Signed)
If you are interested in shingles vaccine in future - call your insurance company to see how coverage is and call us to schedule Work hard on low sugar diet  We will refer you for colonoscopy at check out  Pap smear today Tdap vaccine today  Schedule non fasting lab and follow up in 3 months for sugar control

## 2010-06-22 NOTE — Assessment & Plan Note (Signed)
aic is up so need to watch Long disc of low glycemic diet  re check and f/u in 3 mo

## 2010-06-22 NOTE — Assessment & Plan Note (Signed)
Good control with diet  Rev low sat fat diet  And goals for cholesterol

## 2010-06-29 NOTE — Procedures (Signed)
NAME:  Jasmine Rollins, Jasmine Rollins NO.:  [id number]   MEDICAL RECORD NO.:  [id number]          PATIENT TYPE:  OUT   LOCATION:  SLEEP CENTER                 FACILITY:  [facility]   PHYSICIAN:  Clinton D. Maple Hudson, MD, FCCP, FACPDATE OF BIRTH:  1950/01/01   DATE OF STUDY:  02/28/2007                            NOCTURNAL POLYSOMNOGRAM   REFERRING PHYSICIAN:  Onalee Hua L. Annalee Genta, M.D.   INDICATION FOR STUDY:  Hypersomnia with sleep apnea.  Epworth sleepiness  sore 16/24, BMI 23.4, weight 132 pounds, height 63 inches.  Neck 13  inches.   HOME MEDICATIONS:  Charter and reviewed.   SLEEP ARCHITECTURE:  The sleep is split study protocol.  During the  diagnostic phase, total sleep time was 136 minutes with sleep efficiency  66.3%.  Stage 1 was 9.2%.  Stage 2, 90.8%.  Stage 3 and REM were absent.  Sleep latency 45 minutes.  Awake after sleep onset 24.5 minutes.  Arousal index 24.7.  No bedtime medication was taken.   RESPIRATORY DATA:  Split study protocol.  Apnea/hypopnea index (AHI)  15.9 obstructive events per hour indicating mild to moderate obstructive  sleep apnea/hypopnea syndrome before CPAP.  There were 36 total events  including 20 obstructive apneas, and 16 hypopneas.  All events were  reported as being while sleeping supine.  CPAP was titrated to 10 CWP  (AHI 1.8 per hour).  A small Mirage Quattro mask was used with heated  humidifier.   OXYGEN DATA:  Moderate snoring subsequently prevented by CPAP.  Oxygen  desaturation to a nadir of 84% before CPAP.  After CPAP control oxygen  saturation of 96.5% on room air.   CARDIAC DATA:  Sinus rhythm.   MOVEMENT/PARASOMNIA:  During the diagnostic phase there were frequent  limb jerks with an index of 14.1 per hour.  After CPAP titration, this  stopped indicating most limb jerks were related to respiratory arousals.  No bathroom trips.   IMPRESSION/RECOMMENDATION:  1. Mild to moderate sleep apnea/hypopnea syndrome, AHI 15.9 per  hour      with most events recorded while supine.  Moderate snoring with      oxygen desaturation to a nadir of 84%.  2. Successful CPAP titration to 10 CWP, AHI 1.8 per hour.  She chose a      small Mirage Quattro mask with heated humidifier.  3. Frequent limb jerks averaging 14.1 per hour over 4 CPAP titration      were associated with respiratory arousals and stopped after      application of CPAP.      Clinton D. Maple Hudson, MD, [facility], FACP  Diplomate, Biomedical engineer of Sleep Medicine  Electronically Signed     CDY/MEDQ  D:  03/10/2007 09:23:20  T:  03/10/2007 19:29:03  Job:  [id number]

## 2010-07-02 ENCOUNTER — Telehealth: Payer: Self-pay | Admitting: *Deleted

## 2010-07-02 NOTE — Telephone Encounter (Signed)
Pt states she checked with her insurance company and they will cover zostavax.  She would like to get this, ok to schedule?

## 2010-07-02 NOTE — Telephone Encounter (Signed)
Yes- after 6/8 since her tdap was 5/8--- I like to give zostavax at least 1 mo after other shots thanks

## 2010-07-06 ENCOUNTER — Encounter: Payer: Self-pay | Admitting: *Deleted

## 2010-07-06 NOTE — Telephone Encounter (Signed)
Appointment made for a nurse visit on 6/14.

## 2010-07-29 ENCOUNTER — Ambulatory Visit

## 2010-08-05 ENCOUNTER — Ambulatory Visit (INDEPENDENT_AMBULATORY_CARE_PROVIDER_SITE_OTHER): Admitting: Family Medicine

## 2010-08-05 DIAGNOSIS — Z23 Encounter for immunization: Secondary | ICD-10-CM

## 2010-08-05 DIAGNOSIS — Z2911 Encounter for prophylactic immunotherapy for respiratory syncytial virus (RSV): Secondary | ICD-10-CM

## 2010-08-05 NOTE — Progress Notes (Signed)
Shingles shot given

## 2010-09-16 ENCOUNTER — Other Ambulatory Visit (INDEPENDENT_AMBULATORY_CARE_PROVIDER_SITE_OTHER)

## 2010-09-16 DIAGNOSIS — R7309 Other abnormal glucose: Secondary | ICD-10-CM

## 2010-09-16 LAB — HEMOGLOBIN A1C: Hgb A1c MFr Bld: 6.2 % (ref 4.6–6.5)

## 2010-09-22 ENCOUNTER — Ambulatory Visit (INDEPENDENT_AMBULATORY_CARE_PROVIDER_SITE_OTHER): Admitting: Family Medicine

## 2010-09-22 ENCOUNTER — Encounter: Payer: Self-pay | Admitting: Family Medicine

## 2010-09-22 VITALS — BP 128/80 | HR 80 | Temp 98.0°F | Ht 62.25 in | Wt 133.8 lb

## 2010-09-22 DIAGNOSIS — J309 Allergic rhinitis, unspecified: Secondary | ICD-10-CM

## 2010-09-22 DIAGNOSIS — J45991 Cough variant asthma: Secondary | ICD-10-CM

## 2010-09-22 DIAGNOSIS — R7309 Other abnormal glucose: Secondary | ICD-10-CM

## 2010-09-22 MED ORDER — ALENDRONATE SODIUM 70 MG PO TABS: 70.0000 mg | ORAL_TABLET | ORAL | Status: DC

## 2010-09-22 MED ORDER — MOMETASONE FUROATE 50 MCG/ACT NA SUSP: 2.0000 | Freq: Every day | NASAL | Status: DC

## 2010-09-22 MED ORDER — ALBUTEROL SULFATE HFA 108 (90 BASE) MCG/ACT IN AERS: 2.0000 | INHALATION_SPRAY | RESPIRATORY_TRACT | Status: DC | PRN

## 2010-09-22 MED ORDER — MOMETASONE FUROATE 220 MCG/INH IN AEPB: 1.0000 | INHALATION_SPRAY | Freq: Every day | RESPIRATORY_TRACT | Status: DC | PRN

## 2010-09-22 NOTE — Progress Notes (Signed)
Subjective:    Patient ID: Jasmine Rollins, female    DOB: 03/11/1950, 60 y.o.   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 HYPERCHOLESTEROLEMIA  . LEUKOCYTOPENIA UNSPECIFIED  . HYPERTENSION  . SINUSITIS, RECURRENT  . ALLERGIC RHINITIS  . COUGH VARIANT ASTHMA  . DIVERTICULOSIS, COLON  . BACK PAIN, LUMBAR  . OSTEOPOROSIS  . SLEEP APNEA  . CERVICAL LYMPHADENOPATHY  . DIABETES MELLITUS, BORDERLINE  . LIVER FUNCTION TESTS, ABNORMAL  . Sleep apnea  . Routine general medical examination at a health care facility  . Gynecological examination  . Special screening for malignant neoplasms, colon  . Glaucoma suspect   Past Medical History  Diagnosis Date  . Allergic rhinitis   . Diverticulosis of colon   . HTN (hypertension)   . Cough variant asthma   . Borderline high cholesterol   . Hyperglycemia     borderline DM  . GERD (gastroesophageal reflux disease)   . OP (osteoporosis)   . Sleep apnea    Past Surgical History  Procedure Date  . Tubal ligation   . Colposcopy   . Rotator cuff repair     right  . Esophagogastroduodenoscopy 10/01    Negative   History  Substance Use Topics  . Smoking status: Never Smoker   . Smokeless tobacco: Not on file   Comment: Remote 2nd hand exposure  . Alcohol Use: Yes     Rare   Family  History  Problem Relation Age of Onset  . Hypertension Father   . Diabetes Father   . Diabetes Brother   . Diabetes Brother   . Cancer Brother     prostate  . Cancer Brother     prostate  . Cancer Brother     prostate  . Cancer Brother     prostate  . Cancer Brother     prostate  . Cancer Brother     prostate   Allergies  Allergen Reactions  . Sulfonamide Derivatives    Current Outpatient Prescriptions on File Prior to Visit  Medication Sig Dispense Refill  . calcium carbonate (OS-CAL) 600 MG TABS Take 600 mg by mouth 2 (two) times daily with a meal.        . Cholecalciferol (VITAMIN D) 400 UNITS capsule Take 400 Units by mouth daily.        Marland Kitchen olmesartan-hydrochlorothiazide (BENICAR HCT) 40-12.5 MG per tablet Take 1 tablet by mouth daily.  90 tablet  3  . acetaminophen (TYLENOL) 500 MG tablet Take 500 mg by mouth as directed.        Marland Kitchen esomeprazole (NEXIUM) 40 MG capsule Take 40 mg by mouth daily before breakfast.        .  fexofenadine (ALLEGRA) 180 MG tablet Take 180 mg by mouth daily.            Review of Systems Review of Systems  Constitutional: Negative for fever, appetite change, fatigue and unexpected weight change.  Eyes: Negative for pain and visual disturbance.  ENT pos for runny and stuffy nose/neg for sinus pain  Respiratory: pos for cough without wheeze/ neg for production or sob   Cardiovascular: Negative. For cp or sob   Gastrointestinal: Negative for nausea, diarrhea and constipation.  Genitourinary: Negative for urgency and frequency.  Skin: Negative for pallor. or rash Neurological: Negative for weakness, light-headedness, numbness and headaches.  Hematological: Negative for adenopathy. Does not bruise/bleed easily.  Psychiatric/Behavioral: Negative for dysphoric mood. The patient is not nervous/anxious.          Objective:   Physical Exam  Constitutional: She appears well-developed and well-nourished. No distress.  HENT:  Head: Normocephalic  and atraumatic.  Right Ear: External ear normal.  Left Ear: External ear normal.  Nose: Nose normal.  Mouth/Throat: Oropharynx is clear and moist.  Eyes: Conjunctivae and EOM are normal. Pupils are equal, round, and reactive to light.  Neck: Normal range of motion. Neck supple. No JVD present. Carotid bruit is not present. No thyromegaly present.  Cardiovascular: Normal rate, regular rhythm and normal heart sounds.   Pulmonary/Chest: Effort normal and breath sounds normal. No respiratory distress. She has no wheezes. She has no rales. She exhibits no tenderness.  Abdominal: Soft. Bowel sounds are normal. She exhibits no distension and no mass. There is no tenderness.  Musculoskeletal: She exhibits no edema and no tenderness.  Lymphadenopathy:    She has no cervical adenopathy.  Neurological: She is alert. She has normal reflexes. No cranial nerve deficit. Coordination normal.  Skin: Skin is warm and dry. No rash noted. No erythema. No pallor.  Psychiatric: She has a normal mood and affect.          Assessment & Plan:

## 2010-09-22 NOTE — Assessment & Plan Note (Signed)
Made plan for fall season  Will take allegra otc and nasonex (sent 1 to pharmacy and printed for mail order) Update if this does not control symptoms (has also used astelin in past )

## 2010-09-22 NOTE — Assessment & Plan Note (Signed)
Her allergy season is starting Will get back on regular asmanex for mt Also proventil for rescue  meds px for mail in  Update if not in good control

## 2010-09-22 NOTE — Assessment & Plan Note (Signed)
a1c coming down with diet and exercise  Pt is doing well  Rev low glycemic diet and goals or activity Re check a1c in 6 mo No DM symptoms DM does run in family

## 2010-09-22 NOTE — Patient Instructions (Signed)
Keep watching sugar in diet and exercising  Use asmanex daily for asthma and proventil when you need it  Use allegra (buy that over the counter) for allergies along with nasonex daily  Schedule labs in 6 months for a1c

## 2011-03-25 ENCOUNTER — Other Ambulatory Visit: Payer: Self-pay | Admitting: *Deleted

## 2011-03-25 MED ORDER — MOMETASONE FUROATE 50 MCG/ACT NA SUSP: 2.0000 | Freq: Every day | NASAL | Status: DC

## 2011-03-28 ENCOUNTER — Other Ambulatory Visit (INDEPENDENT_AMBULATORY_CARE_PROVIDER_SITE_OTHER)

## 2011-03-28 DIAGNOSIS — R7309 Other abnormal glucose: Secondary | ICD-10-CM

## 2011-04-07 ENCOUNTER — Encounter (HOSPITAL_COMMUNITY): Payer: Self-pay | Admitting: Emergency Medicine

## 2011-04-07 ENCOUNTER — Emergency Department (INDEPENDENT_AMBULATORY_CARE_PROVIDER_SITE_OTHER)
Admission: EM | Admit: 2011-04-07 | Discharge: 2011-04-07 | Disposition: A | Discharge status: Home Health | Source: Home / Self Care | Attending: Family Medicine | Admitting: Family Medicine

## 2011-04-07 DIAGNOSIS — J329 Chronic sinusitis, unspecified: Secondary | ICD-10-CM

## 2011-04-07 MED ORDER — GUAIFENESIN-CODEINE 100-10 MG/5ML PO SYRP: 5.0000 mL | ORAL_SOLUTION | Freq: Four times a day (QID) | ORAL | Status: AC | PRN

## 2011-04-07 MED ORDER — AZITHROMYCIN 250 MG PO TABS: 250.0000 mg | ORAL_TABLET | Freq: Every day | ORAL | Status: AC

## 2011-04-07 NOTE — Discharge Instructions (Signed)
Tylenol or motrin as needed avoid caffeine and milk products. mucinex recommmended. Sinusitis Sinuses are air pockets within the bones of your face. The growth of bacteria within a sinus leads to infection. The infection prevents the sinuses from draining. This infection is called sinusitis. SYMPTOMS  There will be different areas of pain depending on which sinuses have become infected.  The maxillary sinuses often produce pain beneath the eyes.   Frontal sinusitis may cause pain in the middle of the forehead and above the eyes.  Other problems (symptoms) include:  Toothaches.   Colored, pus-like (purulent) drainage from the nose.   Swelling, warmth, and tenderness over the sinus areas may be signs of infection.  TREATMENT  Sinusitis is most often determined by an exam.X-rays may be taken. If x-rays have been taken, make sure you obtain your results or find out how you are to obtain them. Your caregiver may give you medications (antibiotics). These are medications that will help kill the bacteria causing the infection. You may also be given a medication (decongestant) that helps to reduce sinus swelling.  HOME CARE INSTRUCTIONS   Only take over-the-counter or prescription medicines for pain, discomfort, or fever as directed by your caregiver.   Drink extra fluids. Fluids help thin the mucus so your sinuses can drain more easily.   Applying either moist heat or ice packs to the sinus areas may help relieve discomfort.   Use saline nasal sprays to help moisten your sinuses. The sprays can be found at your local drugstore.  SEEK IMMEDIATE MEDICAL CARE IF:  You have a fever.   You have increasing pain, severe headaches, or toothache.   You have nausea, vomiting, or drowsiness.   You develop unusual swelling around the face or trouble seeing.  MAKE SURE YOU:   Understand these instructions.   Will watch your condition.   Will get help right away if you are not doing well or get  worse.  Document Released: 01/31/2005 Document Revised: 10/13/2010 Document Reviewed: 08/30/2006 [facility] Patient Information 2012 [place], [place].

## 2011-04-07 NOTE — ED Provider Notes (Signed)
History     CSN: [id number]  Arrival date & time 04/07/11  1032   First MD Initiated Contact with Patient 04/07/11 1135      Chief Complaint  Patient presents with  . Sinusitis    (Consider location/radiation/quality/duration/timing/severity/associated sxs/prior treatment) HPI Comments: Sinus congestion. Onset approx a wk. Taking mucinex. States no fever. Thought she was getting better then developed a headache and facial pain. Has some left ear pain and her teeth hurt. No cough. Admits to some post nasal drainage.   The history is provided by the patient.    Past Medical History  Diagnosis Date  . Allergic rhinitis   . Diverticulosis of colon   . HTN (hypertension)   . Cough variant asthma   . Borderline high cholesterol   . Hyperglycemia     borderline DM  . GERD (gastroesophageal reflux disease)   . OP (osteoporosis)   . Sleep apnea     Past Surgical History  Procedure Date  . Tubal ligation   . Colposcopy   . Rotator cuff repair     right  . Esophagogastroduodenoscopy 10/01    Negative    Family History  Problem Relation Age of Onset  . Hypertension Father   . Diabetes Father   . Diabetes Brother   . Diabetes Brother   . Cancer Brother     prostate  . Cancer Brother     prostate  . Cancer Brother     prostate  . Cancer Brother     prostate  . Cancer Brother     prostate  . Cancer Brother     prostate    History  Substance Use Topics  . Smoking status: Never Smoker   . Smokeless tobacco: Not on file   Comment: Remote 2nd hand exposure  . Alcohol Use: No     Rare    OB History    Grav Para Term Preterm Abortions TAB SAB Ect Mult Living                  Review of Systems  Constitutional: Negative.   HENT: Positive for ear pain, congestion and postnasal drip. Negative for hearing loss.   Respiratory: Negative.   Cardiovascular: Negative.   Gastrointestinal: Negative.   Genitourinary: Negative.   Neurological: Positive for  headaches.    Allergies  Sulfonamide derivatives  Home Medications   Current Outpatient Rx  Name Route Sig Dispense Refill  . MOMETASONE FUROATE 220 MCG/INH IN AEPB Inhalation Inhale 1 puff into the lungs daily as needed. 3 Inhaler 3  . MOMETASONE FUROATE 50 MCG/ACT NA SUSP Nasal Place 2 sprays into the nose daily. 51 g 2  . OLMESARTAN MEDOXOMIL-HCTZ 40-12.5 MG PO TABS Oral Take 1 tablet by mouth daily. 90 tablet 3  . ACETAMINOPHEN 500 MG PO TABS Oral Take 500 mg by mouth as directed.      . ALBUTEROL SULFATE HFA 108 (90 BASE) MCG/ACT IN AERS Inhalation Inhale 2 puffs into the lungs every 4 (four) hours as needed. 3 Inhaler 3  . ALENDRONATE SODIUM 70 MG PO TABS Oral Take 1 tablet (70 mg total) by mouth every 7 (seven) days. Take with a full glass of water on an empty stomach. 12 tablet 3  . AZITHROMYCIN 250 MG PO TABS Oral Take 1 tablet (250 mg total) by mouth daily. Take first 2 tablets together, then 1 every day until finished. 6 tablet 0  . CALCIUM CARBONATE 600 MG PO TABS Oral Take  600 mg by mouth 2 (two) times daily with a meal.      . VITAMIN D 400 UNITS PO CAPS Oral Take 400 Units by mouth daily.      Marland Kitchen ESOMEPRAZOLE MAGNESIUM 40 MG PO CPDR Oral Take 40 mg by mouth daily before breakfast.      . FEXOFENADINE HCL 180 MG PO TABS Oral Take 180 mg by mouth daily.      . GUAIFENESIN-CODEINE 100-10 MG/5ML PO SYRP Oral Take 5 mLs by mouth every 6 (six) hours as needed for cough. 120 mL 0    BP 150/92  Pulse 80  Temp(Src) 98.2 F (36.8 C) (Oral)  Resp 18  SpO2 100%  Physical Exam  Constitutional: She appears well-developed and well-nourished. No distress.  HENT:  Head: Normocephalic and atraumatic.       Frontal and maxillary tenderness. Ears clear, nose congested, throat mildly red with post nasal drainage  Neck: Normal range of motion. Neck supple.  Cardiovascular: Normal rate and regular rhythm.   Pulmonary/Chest: Effort normal and breath sounds normal.  Lymphadenopathy:     She has no cervical adenopathy.    ED Course  Procedures (including critical care time)  Labs Reviewed - No data to display No results found.   1. Sinusitis       MDM          Randa Spike, MD 04/07/11 1302

## 2011-04-07 NOTE — ED Notes (Signed)
HERE WITH SINUS SX THAT STARTED WITH COLD X 3WEEKS AGO WHICH CLEARED UP WITH MUCI NEX AND COUGH MEDS.C/O SINUS PRESSURE/POST NASAL DRAINAGE RADIATING PAIN TO EARS AND TEETH AND SOB.VSS

## 2011-05-18 ENCOUNTER — Other Ambulatory Visit: Payer: Self-pay | Admitting: Family Medicine

## 2011-05-18 DIAGNOSIS — Z1231 Encounter for screening mammogram for malignant neoplasm of breast: Secondary | ICD-10-CM

## 2011-06-09 ENCOUNTER — Ambulatory Visit
Admission: RE | Admit: 2011-06-09 | Discharge: 2011-06-09 | Disposition: A | Discharge status: Home / Self Care | Source: Ambulatory Visit | Attending: Family Medicine | Admitting: Family Medicine

## 2011-06-09 DIAGNOSIS — Z1231 Encounter for screening mammogram for malignant neoplasm of breast: Secondary | ICD-10-CM

## 2011-06-14 ENCOUNTER — Encounter: Payer: Self-pay | Admitting: *Deleted

## 2011-07-12 ENCOUNTER — Other Ambulatory Visit: Payer: Self-pay

## 2011-07-12 MED ORDER — OLMESARTAN MEDOXOMIL-HCTZ 40-12.5 MG PO TABS: 1.0000 | ORAL_TABLET | Freq: Every day | ORAL | Status: DC

## 2011-07-12 NOTE — Telephone Encounter (Signed)
Pt request Benicar HCT #30 x 0 to [facility] (pt out of med) and # [facility] x 0 [facility]. Pt has CPX scheduled in 09/2011.Pt notified while on phone.

## 2011-09-26 ENCOUNTER — Telehealth: Payer: Self-pay | Admitting: Family Medicine

## 2011-09-26 DIAGNOSIS — Z0000 Encounter for general adult medical examination without abnormal findings: Secondary | ICD-10-CM

## 2011-09-26 DIAGNOSIS — R7309 Other abnormal glucose: Secondary | ICD-10-CM

## 2011-09-26 DIAGNOSIS — E7800 Pure hypercholesterolemia, unspecified: Secondary | ICD-10-CM

## 2011-09-26 DIAGNOSIS — M810 Age-related osteoporosis without current pathological fracture: Secondary | ICD-10-CM

## 2011-09-26 NOTE — Telephone Encounter (Signed)
Message copied by Judy Pimple on Mon Sep 26, 2011 10:30 PM ------      Message from: Alvina Chou      Created: Wed Sep 21, 2011 12:38 PM      Regarding: Lab orders for Tuesday, 8.13.13       Patient is scheduled for CPX labs, please order future labs, Thanks , Camelia Eng

## 2011-09-27 ENCOUNTER — Other Ambulatory Visit (INDEPENDENT_AMBULATORY_CARE_PROVIDER_SITE_OTHER)

## 2011-09-27 DIAGNOSIS — M810 Age-related osteoporosis without current pathological fracture: Secondary | ICD-10-CM

## 2011-09-27 DIAGNOSIS — E7800 Pure hypercholesterolemia, unspecified: Secondary | ICD-10-CM

## 2011-09-27 DIAGNOSIS — R7309 Other abnormal glucose: Secondary | ICD-10-CM

## 2011-09-27 DIAGNOSIS — Z0000 Encounter for general adult medical examination without abnormal findings: Secondary | ICD-10-CM

## 2011-09-27 LAB — CBC WITH DIFFERENTIAL/PLATELET
Basophils Absolute: 0 10*3/uL (ref 0.0–0.1)
Eosinophils Absolute: 0.1 10*3/uL (ref 0.0–0.7)
HCT: 42.1 % (ref 36.0–46.0)
Lymphs Abs: 1.9 10*3/uL (ref 0.7–4.0)
MCHC: 32.8 g/dL (ref 30.0–36.0)
MCV: 96 fl (ref 78.0–100.0)
Monocytes Absolute: 0.3 10*3/uL (ref 0.1–1.0)
Platelets: 246 10*3/uL (ref 150.0–400.0)
RDW: 13.1 % (ref 11.5–14.6)

## 2011-09-27 LAB — LIPID PANEL
Cholesterol: 213 mg/dL — ABNORMAL HIGH (ref 0–200)
Triglycerides: 61 mg/dL (ref 0.0–149.0)

## 2011-09-27 LAB — COMPREHENSIVE METABOLIC PANEL
ALT: 65 U/L — ABNORMAL HIGH (ref 0–35)
Alkaline Phosphatase: 82 U/L (ref 39–117)
Creatinine, Ser: 0.7 mg/dL (ref 0.4–1.2)
Sodium: 141 mEq/L (ref 135–145)
Total Bilirubin: 1.3 mg/dL — ABNORMAL HIGH (ref 0.3–1.2)
Total Protein: 7.8 g/dL (ref 6.0–8.3)

## 2011-09-28 LAB — LDL CHOLESTEROL, DIRECT: Direct LDL: 104.2 mg/dL

## 2011-10-04 ENCOUNTER — Encounter: Payer: Self-pay | Admitting: Family Medicine

## 2011-10-04 ENCOUNTER — Ambulatory Visit (INDEPENDENT_AMBULATORY_CARE_PROVIDER_SITE_OTHER): Admitting: Family Medicine

## 2011-10-04 VITALS — BP 122/76 | HR 74 | Temp 98.4°F | Ht 63.0 in | Wt 129.0 lb

## 2011-10-04 DIAGNOSIS — M810 Age-related osteoporosis without current pathological fracture: Secondary | ICD-10-CM

## 2011-10-04 DIAGNOSIS — R7309 Other abnormal glucose: Secondary | ICD-10-CM

## 2011-10-04 DIAGNOSIS — Z0000 Encounter for general adult medical examination without abnormal findings: Secondary | ICD-10-CM

## 2011-10-04 DIAGNOSIS — E7800 Pure hypercholesterolemia, unspecified: Secondary | ICD-10-CM

## 2011-10-04 DIAGNOSIS — R945 Abnormal results of liver function studies: Secondary | ICD-10-CM

## 2011-10-04 DIAGNOSIS — I10 Essential (primary) hypertension: Secondary | ICD-10-CM

## 2011-10-04 MED ORDER — MOMETASONE FUROATE 220 MCG/INH IN AEPB: 1.0000 | INHALATION_SPRAY | Freq: Every day | RESPIRATORY_TRACT | Status: DC | PRN

## 2011-10-04 MED ORDER — MOMETASONE FUROATE 50 MCG/ACT NA SUSP: 2.0000 | Freq: Every day | NASAL | Status: DC

## 2011-10-04 MED ORDER — ALENDRONATE SODIUM 70 MG PO TABS: 70.0000 mg | ORAL_TABLET | ORAL | Status: DC

## 2011-10-04 MED ORDER — OLMESARTAN MEDOXOMIL-HCTZ 40-12.5 MG PO TABS: 1.0000 | ORAL_TABLET | Freq: Every day | ORAL | Status: DC

## 2011-10-04 NOTE — Assessment & Plan Note (Signed)
bp in fair control at this time  No changes needed  Disc lifstyle change with low sodium diet and exercise   Rev labs with pt  

## 2011-10-04 NOTE — Patient Instructions (Signed)
We will schedule bone density test at check out  Keep up the great work taking care of yourself  Schedule non fasting lab in 3 months

## 2011-10-04 NOTE — Assessment & Plan Note (Signed)
Imp a1c of 6 with diet and exercise Commended Will continue to follow

## 2011-10-04 NOTE — Assessment & Plan Note (Signed)
Disc goals for lipids and reasons to control them Rev labs with pt Rev low sat fat diet in detail  HDL improved with exercise

## 2011-10-04 NOTE — Assessment & Plan Note (Signed)
Schedule dexa  Rev ca and D Good D level Good exercise No fx Tolerating fosamax

## 2011-10-04 NOTE — Assessment & Plan Note (Signed)
Reviewed health habits including diet and exercise and skin cancer prevention Also reviewed health mt list, fam hx and immunizations   Rev wellness labs in detail Breast exam done

## 2011-10-04 NOTE — Assessment & Plan Note (Signed)
?   etiol - has been intermittent No tylenol or etoh Re check 3 mo

## 2011-10-04 NOTE — Progress Notes (Signed)
Subjective:    Patient ID: Jasmine Rollins, female    DOB: April 26, 1950, 60 y.o.   MRN: [id number]  HPI Here for health maintenance exam and to review chronic medical problems    Is feeling good overall  Aches and pains occ- better if she keeps exercising  Wt is down 4 lb with bmi of 22 She does really well with that -eating right ! - is habit now  Cut way back on sugar and bread   mammo 4/13 Self exam- no lumps or changes   Gyn pap 5/12 Years and years from abn pap -- last ones normal   colonosc 1/07- 10 year f/u No bowel changes   dexa 2/11- [place] Is due  OP On fosamax-no problems at all - very compliant  Ca and D D level is good at 50  bp is stable today  No cp or palpitations or headaches or edema  No side effects to medicines  BP Readings from Last 3 Encounters:  10/04/11 122/76  04/07/11 150/92  09/22/10 128/80      Ast/alt was up  Lab Results  Component Value Date   ALT 65* 09/27/2011   AST 46* 09/27/2011   ALKPHOS 82 09/27/2011   BILITOT 1.3* 09/27/2011   does not take tylenol often, occ advil No alcohol Drinks Typhoo tea -- orders it from [place]   Lipid Diet controlled Lab Results  Component Value Date   CHOL 213* 09/27/2011   CHOL 195 06/17/2010   CHOL 182 01/26/2009   Lab Results  Component Value Date   HDL 92.30 09/27/2011   HDL 01.02 06/17/2010   HDL 73.10 01/26/2009   Lab Results  Component Value Date   LDLCALC 110* 06/17/2010   LDLCALC 97 01/26/2009   Lab Results  Component Value Date   TRIG 61.0 09/27/2011   TRIG 74.0 06/17/2010   TRIG 60.0 01/26/2009   Lab Results  Component Value Date   CHOLHDL 2 09/27/2011   CHOLHDL 3 06/17/2010   CHOLHDL 2 01/26/2009   Lab Results  Component Value Date   LDLDIRECT 104.2 09/27/2011   LDLDIRECT 86.7 11/27/2007   diet and exercise help Great HDL!!!  Hyperglycemia a1c 6 down from 6.2-good Is proud of that   Patient Active Problem List  Diagnosis  . HYPERCHOLESTEROLEMIA  . LEUKOCYTOPENIA  UNSPECIFIED  . HYPERTENSION  . SINUSITIS, RECURRENT  . ALLERGIC RHINITIS  . COUGH VARIANT ASTHMA  . DIVERTICULOSIS, COLON  . BACK PAIN, LUMBAR  . OSTEOPOROSIS  . SLEEP APNEA  . CERVICAL LYMPHADENOPATHY  . Hyperglycemia  . LIVER FUNCTION TESTS, ABNORMAL  . Sleep apnea  . Routine general medical examination at a health care facility  . Gynecological examination  . Special screening for malignant neoplasms, colon  . Glaucoma suspect   Past Medical History  Diagnosis Date  . Allergic rhinitis   . Diverticulosis of colon   . HTN (hypertension)   . Cough variant asthma   . Borderline high cholesterol   . Hyperglycemia     borderline DM  . GERD (gastroesophageal reflux disease)   . OP (osteoporosis)   . Sleep apnea    Past Surgical History  Procedure Date  . Tubal ligation   . Colposcopy   . Rotator cuff repair     right  . Esophagogastroduodenoscopy 10/01    Negative   History  Substance Use Topics  . Smoking status: Never Smoker   . Smokeless tobacco: Not on file   Comment: Remote 2nd hand  exposure  . Alcohol Use: No     Rare   Family History  Problem Relation Age of Onset  . Hypertension Father   . Diabetes Father   . Diabetes Brother   . Diabetes Brother   . Cancer Brother     prostate  . Cancer Brother     prostate  . Cancer Brother     prostate  . Cancer Brother     prostate  . Cancer Brother     prostate  . Cancer Brother     prostate   Allergies  Allergen Reactions  . Sulfonamide Derivatives    Current Outpatient Prescriptions on File Prior to Visit  Medication Sig Dispense Refill  . albuterol (PROVENTIL HFA) 108 (90 BASE) MCG/ACT inhaler Inhale 2 puffs into the lungs every 4 (four) hours as needed.  3 Inhaler  3  . alendronate (FOSAMAX) 70 MG tablet Take 1 tablet (70 mg total) by mouth every 7 (seven) days. Take with a full glass of water on an empty stomach.  12 tablet  3  . calcium carbonate (OS-CAL) 600 MG TABS Take 600 mg by mouth 2  (two) times daily with a meal.        . Cholecalciferol (VITAMIN D) 400 UNITS capsule Take 400 Units by mouth daily.        . fexofenadine (ALLEGRA) 180 MG tablet Take 180 mg by mouth daily.        . mometasone (ASMANEX) 220 MCG/INH inhaler Inhale 1 puff into the lungs daily as needed.  3 Inhaler  3  . mometasone (NASONEX) 50 MCG/ACT nasal spray Place 2 sprays into the nose daily.  51 g  2  . olmesartan-hydrochlorothiazide (BENICAR HCT) 40-12.5 MG per tablet Take 1 tablet by mouth daily.  90 tablet  0  . acetaminophen (TYLENOL) 500 MG tablet Take 500 mg by mouth as directed.        Marland Kitchen esomeprazole (NEXIUM) 40 MG capsule Take 40 mg by mouth daily before breakfast.            Review of Systems Review of Systems  Constitutional: Negative for fever, appetite change, fatigue and unexpected weight change.  Eyes: Negative for pain and visual disturbance.  Respiratory: Negative for cough and shortness of breath.   Cardiovascular: Negative for cp or palpitations    Gastrointestinal: Negative for nausea, diarrhea and constipation.  Genitourinary: Negative for urgency and frequency.  Skin: Negative for pallor or rash   Neurological: Negative for weakness, light-headedness, numbness and headaches.  Hematological: Negative for adenopathy. Does not bruise/bleed easily.  Psychiatric/Behavioral: Negative for dysphoric mood. The patient is not nervous/anxious.         Objective:   Physical Exam  Constitutional: She appears well-developed and well-nourished. No distress.  HENT:  Head: Normocephalic and atraumatic.  Right Ear: External ear normal.  Left Ear: External ear normal.  Nose: Nose normal.  Mouth/Throat: No oropharyngeal exudate.  Eyes: Conjunctivae and EOM are normal. Pupils are equal, round, and reactive to light. Right eye exhibits no discharge. Left eye exhibits no discharge.  Neck: Normal range of motion. Neck supple. No JVD present. Carotid bruit is not present. No thyromegaly present.   Cardiovascular: Normal rate, regular rhythm, normal heart sounds and intact distal pulses.  Exam reveals no gallop.   Pulmonary/Chest: Effort normal and breath sounds normal. No respiratory distress. She has no wheezes.  Abdominal: Soft. Bowel sounds are normal. She exhibits no distension, no abdominal bruit and no mass. There  is no tenderness.  Genitourinary: No breast swelling, tenderness, discharge or bleeding.       Breast exam: No mass, nodules, thickening, tenderness, bulging, retraction, inflamation, nipple discharge or skin changes noted.  No axillary or clavicular LA.  Chaperoned exam.    Musculoskeletal: Normal range of motion. She exhibits no edema and no tenderness.  Lymphadenopathy:    She has no cervical adenopathy.  Neurological: She is alert. She has normal reflexes. No cranial nerve deficit. She exhibits normal muscle tone. Coordination normal.  Skin: Skin is warm and dry. No rash noted. No erythema.  Psychiatric: She has a normal mood and affect.          Assessment & Plan:

## 2011-10-07 ENCOUNTER — Ambulatory Visit (INDEPENDENT_AMBULATORY_CARE_PROVIDER_SITE_OTHER)
Admission: RE | Admit: 2011-10-07 | Discharge: 2011-10-07 | Disposition: A | Discharge status: Home / Self Care | Source: Ambulatory Visit

## 2011-10-07 DIAGNOSIS — M810 Age-related osteoporosis without current pathological fracture: Secondary | ICD-10-CM

## 2011-10-26 ENCOUNTER — Telehealth: Payer: Self-pay

## 2011-10-26 NOTE — Telephone Encounter (Signed)
Pt request bone density report when available.

## 2011-11-04 ENCOUNTER — Encounter: Payer: Self-pay | Admitting: *Deleted

## 2011-11-04 NOTE — Telephone Encounter (Signed)
Pt requesting bone density report.Please advise.

## 2011-11-04 NOTE — Telephone Encounter (Signed)
I looked at it- she has osteopenia of hip and spine (no longer osteoporosis so she has improved!) Continue ca and D and current medicines Will continue to follow every 2 years Please note dexa with osteopenia in quick abstract improved- for 10/07/11-thanks

## 2011-11-04 NOTE — Telephone Encounter (Signed)
Notified pt of results and updated it in quick abstract

## 2011-11-25 ENCOUNTER — Encounter: Payer: Self-pay | Admitting: Family Medicine

## 2011-12-05 ENCOUNTER — Other Ambulatory Visit: Payer: Self-pay | Admitting: Family Medicine

## 2012-01-04 ENCOUNTER — Other Ambulatory Visit (INDEPENDENT_AMBULATORY_CARE_PROVIDER_SITE_OTHER)

## 2012-01-04 DIAGNOSIS — R945 Abnormal results of liver function studies: Secondary | ICD-10-CM

## 2012-01-04 LAB — HEPATIC FUNCTION PANEL
ALT: 30 U/L (ref 0–35)
Albumin: 4.1 g/dL (ref 3.5–5.2)
Total Protein: 7.8 g/dL (ref 6.0–8.3)

## 2012-01-05 ENCOUNTER — Encounter: Payer: Self-pay | Admitting: *Deleted

## 2012-03-07 ENCOUNTER — Ambulatory Visit (INDEPENDENT_AMBULATORY_CARE_PROVIDER_SITE_OTHER): Admitting: Family Medicine

## 2012-03-07 ENCOUNTER — Encounter: Payer: Self-pay | Admitting: Family Medicine

## 2012-03-07 VITALS — BP 134/92 | HR 74 | Temp 98.4°F | Ht 63.0 in | Wt 133.5 lb

## 2012-03-07 DIAGNOSIS — J069 Acute upper respiratory infection, unspecified: Secondary | ICD-10-CM

## 2012-03-07 NOTE — Progress Notes (Signed)
Subjective:    Patient ID: Jasmine Rollins, female    DOB: February 27, 1950, 62 y.o.   MRN: [id number]  HPI Here with uri symptoms  Her husband had a cold  She started to feel back on Sunday- with nasal/ head congestion- no fever   Started on mucinex bid and advil  Also drinking lots of water and getting rest  Has steroid nasal spray   Chills at night  Has not taken temp  No aches at all  Took a flu shot   Ears are fine No st Some cough -not bad  Patient Active Problem List  Diagnosis  . HYPERCHOLESTEROLEMIA  . LEUKOCYTOPENIA UNSPECIFIED  . HYPERTENSION  . SINUSITIS, RECURRENT  . ALLERGIC RHINITIS  . COUGH VARIANT ASTHMA  . DIVERTICULOSIS, COLON  . BACK PAIN, LUMBAR  . OSTEOPOROSIS  . SLEEP APNEA  . CERVICAL LYMPHADENOPATHY  . Hyperglycemia  . LIVER FUNCTION TESTS, ABNORMAL  . Sleep apnea  . Routine general medical examination at a health care facility  . Gynecological examination  . Special screening for malignant neoplasms, colon  . Glaucoma suspect   Past Medical History  Diagnosis Date  . Allergic rhinitis   . Diverticulosis of colon   . HTN (hypertension)   . Cough variant asthma   . Borderline high cholesterol   . Hyperglycemia     borderline DM  . GERD (gastroesophageal reflux disease)   . OP (osteoporosis)   . Sleep apnea    Past Surgical History  Procedure Date  . Tubal ligation   . Colposcopy   . Rotator cuff repair     right  . Esophagogastroduodenoscopy 10/01    Negative   History  Substance Use Topics  . Smoking status: Never Smoker   . Smokeless tobacco: Not on file     Comment: Remote 2nd hand exposure  . Alcohol Use: No     Comment: Rare   Family History  Problem Relation Age of Onset  . Hypertension Father   . Diabetes Father   . Diabetes Brother   . Diabetes Brother   . Cancer Brother     prostate  . Cancer Brother     prostate  . Cancer Brother     prostate  . Cancer Brother     prostate  . Cancer Brother     prostate    . Cancer Brother     prostate   Allergies  Allergen Reactions  . Sulfonamide Derivatives    Current Outpatient Prescriptions on File Prior to Visit  Medication Sig Dispense Refill  . alendronate (FOSAMAX) 70 MG tablet Take 1 tablet (70 mg total) by mouth every 7 (seven) days. Take with a full glass of water on an empty stomach.  12 tablet  3  . calcium carbonate (OS-CAL) 600 MG TABS Take 600 mg by mouth 2 (two) times daily with a meal.        . Cholecalciferol (VITAMIN D) 400 UNITS capsule Take 400 Units by mouth daily.        . fexofenadine (ALLEGRA) 180 MG tablet Take 180 mg by mouth daily.        . mometasone (ASMANEX) 220 MCG/INH inhaler Inhale 1 puff into the lungs daily as needed.  3 Inhaler  3  . mometasone (NASONEX) 50 MCG/ACT nasal spray Place 2 sprays into the nose daily.  51 g  3  . olmesartan-hydrochlorothiazide (BENICAR HCT) 40-12.5 MG per tablet Take 1 tablet by mouth daily.  90 tablet  3  . PROVENTIL HFA 108 (90 BASE) MCG/ACT inhaler INHALE 2 PUFFS EVERY 4 HOURS AS NEEDED  6 each  2  . acetaminophen (TYLENOL) 500 MG tablet Take 500 mg by mouth as directed.        Marland Kitchen esomeprazole (NEXIUM) 40 MG capsule Take 40 mg by mouth daily before breakfast.            Review of Systems Review of Systems  Constitutional: Negative for fever, appetite change, and unexpected weight change. pos for fatigue  Eyes: Negative for pain and visual disturbance. ENT pos for cong and rhinorrhea and scratchy throat/ neg for sinus tenderness  Respiratory: Negative for wheeze and shortness of breath.   Cardiovascular: Negative for cp or palpitations    Gastrointestinal: Negative for nausea, diarrhea and constipation.  Genitourinary: Negative for urgency and frequency.  Skin: Negative for pallor or rash   Neurological: Negative for weakness, light-headedness, numbness and headaches.  Hematological: Negative for adenopathy. Does not bruise/bleed easily.  Psychiatric/Behavioral: Negative for  dysphoric mood. The patient is not nervous/anxious.         Objective:   Physical Exam  Constitutional: She appears well-developed and well-nourished. No distress.  HENT:  Head: Normocephalic and atraumatic.  Right Ear: External ear normal.  Left Ear: External ear normal.  Mouth/Throat: Oropharynx is clear and moist. No oropharyngeal exudate.       Nares are injected and congested   No sinus tenderness Clear rhinorrhea noted   Eyes: Conjunctivae normal and EOM are normal. Pupils are equal, round, and reactive to light. Right eye exhibits no discharge. No scleral icterus.  Neck: Normal range of motion. Neck supple.  Cardiovascular: Normal rate, regular rhythm and intact distal pulses.   Pulmonary/Chest: Effort normal and breath sounds normal. No respiratory distress. She has no wheezes. She has no rales. She exhibits no tenderness.  Lymphadenopathy:    She has no cervical adenopathy.  Neurological: She is alert.  Skin: Skin is warm and dry. No rash noted.  Psychiatric: She has a normal mood and affect.          Assessment & Plan:

## 2012-03-07 NOTE — Patient Instructions (Addendum)
I think you have a viral cold  It will likely get worse before it gets better Drink lots of fluids and get rest mucinex and ibuprofen are helpful  Also breathe steam and try nasal saline spray  Continue nasonex  Update if not starting to improve in a week or if worsening

## 2012-03-07 NOTE — Assessment & Plan Note (Signed)
With nasal congestion  Disc symptomatic care - see instructions on AVS  Update if not starting to improve in a week or if worsening   Disc s/s of sinusitis to watch for if not improving

## 2012-03-16 ENCOUNTER — Telehealth: Payer: Self-pay | Admitting: Family Medicine

## 2012-03-16 MED ORDER — AMOXICILLIN-POT CLAVULANATE 875-125 MG PO TABS: 1.0000 | ORAL_TABLET | Freq: Two times a day (BID) | ORAL | Status: DC

## 2012-03-16 NOTE — Telephone Encounter (Signed)
Pt notified Rx sent to pharm. And advise to f/u if no improvement

## 2012-03-16 NOTE — Telephone Encounter (Signed)
Please send in px for augmentin F/u if not improved

## 2012-03-16 NOTE — Telephone Encounter (Signed)
Patient Information:  Caller Name: Kathrine  Phone: [phone]  Patient: Jasmine Rollins, Jasmine Rollins  Gender: Female  DOB: June 01, 1950  Age: 62 Years  PCP: Roxy Manns [facility])  Office Follow Up:  Does the office need to follow up with this patient?: Yes  Instructions For The Office: Pharmacy [facility].  REQUEST PRESCRIPTION FOR MEDICATION FOR SINUS INFECTION. PATIENT SEEN 03/07/12, TOLD TO CALL BACK IF NOT IMPROVING.  RN Note:  Pharmacy [facility].  REQUEST PRESCRIPTION FOR MEDICATION FOR SINUS INFECTION. PATIENT SEEN 03/07/12, TOLD TO CALL BACK IF NOT IMPROVING.  Symptoms  Reason For Call & Symptoms: Patient was in office 03/07/12 with Viral URI seen by Dr. Milinda Antis. She was told to call back if not better. Despirte using saliene flushes, nasonxe , mucinex and asthmanex she is not better.  "i feel yucky".  +headache , congestion, productive yellow drainage, ears feel full.  Calling back for medication.  Reviewed Health History In EMR: Yes  Reviewed Medications In EMR: Yes  Reviewed Allergies In EMR: Yes  Reviewed Surgeries / Procedures: No  Date of Onset of Symptoms: 03/07/2012  Treatments Tried: saliene flush, mucinex, nasonex, asthmanex. She is drinking fluids  Treatments Tried Worked: No  Guideline(s) Used:  Sinus Pain and Congestion  Disposition Per Guideline:   See Today or Tomorrow in Office  Reason For Disposition Reached:   Sinus congestion (pressure, fullness) present > 10 days  Advice Given:  For a Runny Nose With Profuse Discharge:  Nasal mucus and discharge helps to wash viruses and bacteria out of the nose and sinuses.  Blowing the nose is all that is needed.  If the skin around your nostrils gets irritated, apply a tiny amount of petroleum ointment to the nasal openings once or twice a day.  Hydration:  Drink plenty of liquids (6-8 glasses of water daily). If the air in your home is dry, use a cool mist humidifier  Call Back If:   Severe pain lasts  longer than 2 hours after pain medicine  Sinus pain lasts longer than 1 day after starting treatment using nasal washes  Sinus congestion (fullness) lasts longer than 10 days  Fever lasts longer than 3 days  You become worse.  RN Overrode Recommendation:  Patient Requests Prescription  Pharmacy [facility].  REQUEST PRESCRIPTION FOR MEDICATION FOR SINUS INFECTION. PATIENT SEEN 03/07/12, TOLD TO CALL BACK IF NOT IMPROVING.

## 2012-03-31 ENCOUNTER — Other Ambulatory Visit: Payer: Self-pay

## 2012-07-24 ENCOUNTER — Other Ambulatory Visit: Payer: Self-pay | Admitting: Family Medicine

## 2012-07-24 NOTE — Telephone Encounter (Signed)
No recent appt (f/u or CPE) and no future appt, please advise

## 2012-07-24 NOTE — Telephone Encounter (Signed)
Please schedule PE in the fall and refill until then, thanks

## 2012-09-19 ENCOUNTER — Ambulatory Visit (INDEPENDENT_AMBULATORY_CARE_PROVIDER_SITE_OTHER): Admitting: Family Medicine

## 2012-09-19 ENCOUNTER — Encounter: Payer: Self-pay | Admitting: Family Medicine

## 2012-09-19 VITALS — BP 132/92 | HR 62 | Temp 98.1°F | Ht 63.0 in | Wt 134.8 lb

## 2012-09-19 DIAGNOSIS — J019 Acute sinusitis, unspecified: Secondary | ICD-10-CM

## 2012-09-19 DIAGNOSIS — B9689 Other specified bacterial agents as the cause of diseases classified elsewhere: Secondary | ICD-10-CM

## 2012-09-19 MED ORDER — AMOXICILLIN-POT CLAVULANATE 875-125 MG PO TABS: 1.0000 | ORAL_TABLET | Freq: Two times a day (BID) | ORAL | Status: DC

## 2012-09-19 NOTE — Patient Instructions (Addendum)
Continue allergy medicines  Saline nasal spray for congestion or netti pot  Do not use afrin for more than 2 days in a row-it is habit forming  Drink fluids and get rest  Take augmentin as directed

## 2012-09-19 NOTE — Progress Notes (Signed)
Subjective:    Patient ID: Jasmine Rollins, female    DOB: 09-14-1950, 62 y.o.   MRN: [id number]  HPI Here for sinus symptoms Began last week with weather change - congestion (cannot breathe well out of nose) Dizziness began early this week when she got up Uses nasonex and allegra and then added some afrin   Not wheezing - but feels the need to take a deep breath every once in a while  A lot of pressure in face/head and teeth  All on the left side  Coughing up mucous - a little color to it / very thick  Most of nasal discharge is clear-not a lot of it   Does not feel good  ? Fever -husband thought she felt hot   Patient Active Problem List   Diagnosis Date Noted  . Gynecological examination 06/22/2010  . Special screening for malignant neoplasms, colon 06/22/2010  . Glaucoma suspect 06/22/2010  . Routine general medical examination at a health care facility 06/03/2010  . Sleep apnea   . OSTEOPOROSIS 05/20/2009  . BACK PAIN, LUMBAR 02/02/2009  . SLEEP APNEA 10/17/2008  . CERVICAL LYMPHADENOPATHY 09/16/2008  . COUGH VARIANT ASTHMA 07/30/2008  . LEUKOCYTOPENIA UNSPECIFIED 01/29/2008  . Hyperglycemia 11/27/2007  . HYPERCHOLESTEROLEMIA 03/21/2007  . HYPERTENSION 03/21/2007  . SINUSITIS, RECURRENT 03/21/2007  . ALLERGIC RHINITIS 03/21/2007  . DIVERTICULOSIS, COLON 03/21/2007  . LIVER FUNCTION TESTS, ABNORMAL 03/21/2007   Past Medical History  Diagnosis Date  . Allergic rhinitis   . Diverticulosis of colon   . HTN (hypertension)   . Cough variant asthma   . Borderline high cholesterol   . Hyperglycemia     borderline DM  . GERD (gastroesophageal reflux disease)   . OP (osteoporosis)   . Sleep apnea    Past Surgical History  Procedure Laterality Date  . Tubal ligation    . Colposcopy    . Rotator cuff repair      right  . Esophagogastroduodenoscopy  10/01    Negative   History  Substance Use Topics  . Smoking status: Never Smoker   . Smokeless tobacco: Not on  file     Comment: Remote 2nd hand exposure  . Alcohol Use: No   Family History  Problem Relation Age of Onset  . Hypertension Father   . Diabetes Father   . Diabetes Brother   . Diabetes Brother   . Cancer Brother     prostate  . Cancer Brother     prostate  . Cancer Brother     prostate  . Cancer Brother     prostate  . Cancer Brother     prostate  . Cancer Brother     prostate   Allergies  Allergen Reactions  . Sulfonamide Derivatives    Current Outpatient Prescriptions on File Prior to Visit  Medication Sig Dispense Refill  . acetaminophen (TYLENOL) 500 MG tablet Take 500 mg by mouth as directed.        Marland Kitchen BENICAR HCT 40-12.5 MG per tablet TAKE 1 TABLET DAILY  90 tablet  2  . calcium carbonate (OS-CAL) 600 MG TABS Take 600 mg by mouth 2 (two) times daily with a meal.        . Cholecalciferol (VITAMIN D) 400 UNITS capsule Take 400 Units by mouth daily.        . fexofenadine (ALLEGRA) 180 MG tablet Take 180 mg by mouth daily.        . mometasone (ASMANEX) 220 MCG/INH inhaler  Inhale 1 puff into the lungs daily as needed.  3 Inhaler  3  . PROVENTIL HFA 108 (90 BASE) MCG/ACT inhaler INHALE 2 PUFFS EVERY 4 HOURS AS NEEDED  6 each  2   No current facility-administered medications on file prior to visit.        Review of Systems Review of Systems  Constitutional: Negative for fever, appetite change, fatigue and unexpected weight change.  Eyes: Negative for pain and visual disturbance.  ENT pos for congestion and facial pain / neg for ear pain  Respiratory: Negative for wheeze and shortness of breath.   Cardiovascular: Negative for cp or palpitations    Gastrointestinal: Negative for nausea, diarrhea and constipation.  Genitourinary: Negative for urgency and frequency.  Skin: Negative for pallor or rash   Neurological: Negative for weakness, light-headedness, numbness and headaches.  Hematological: Negative for adenopathy. Does not bruise/bleed easily.   Psychiatric/Behavioral: Negative for dysphoric mood. The patient is not nervous/anxious.         Objective:   Physical Exam  Constitutional: She appears well-developed and well-nourished. No distress.  HENT:  Head: Normocephalic and atraumatic.  Right Ear: External ear normal.  Left Ear: External ear normal.  Mouth/Throat: Oropharynx is clear and moist. No oropharyngeal exudate.  Nares are injected and congested  L maxillary and ethmoid sinus tenderness  Post nasal drip  Eyes: Conjunctivae and EOM are normal. Pupils are equal, round, and reactive to light. Right eye exhibits no discharge. Left eye exhibits no discharge. No scleral icterus.  Neck: Normal range of motion. Neck supple. No JVD present. Carotid bruit is not present. No thyromegaly present.  Cardiovascular: Normal rate and regular rhythm.   Pulmonary/Chest: Effort normal and breath sounds normal. No respiratory distress. She has no wheezes. She has no rales.  Lymphadenopathy:    She has no cervical adenopathy.  Neurological: She is alert. No cranial nerve deficit.  Skin: Skin is warm and dry. No pallor.  Psychiatric: She has a normal mood and affect.          Assessment & Plan:

## 2012-09-20 NOTE — Assessment & Plan Note (Signed)
augmentin px Disc symptomatic care - see instructions on AVS  Update if not starting to improve in a week or if worsening

## 2012-09-27 ENCOUNTER — Other Ambulatory Visit: Payer: Self-pay

## 2012-09-27 MED ORDER — AMOXICILLIN-POT CLAVULANATE 875-125 MG PO TABS: 1.0000 | ORAL_TABLET | Freq: Two times a day (BID) | ORAL | Status: DC

## 2012-09-27 NOTE — Telephone Encounter (Signed)
Pt seen 09/19/12; pt finished augmentin on 09/25/12 with symptoms cleared. Today pt has h/a back of head, forehead,temple areas and teeth hurt, pt has head congestion and earsache. ? Fever. Pt request refill augmentin [facility].Please advise.

## 2012-09-27 NOTE — Telephone Encounter (Signed)
Repeat augmentin course- please refill and let me know if not improved

## 2012-09-27 NOTE — Telephone Encounter (Signed)
Rx sent to pharmacy and pt notified to update [place] if no improvement after finishing this round of abx

## 2012-09-30 ENCOUNTER — Other Ambulatory Visit: Payer: Self-pay | Admitting: Family Medicine

## 2013-02-20 ENCOUNTER — Telehealth: Payer: Self-pay | Admitting: Family Medicine

## 2013-02-20 NOTE — Telephone Encounter (Signed)
Noted  

## 2013-02-20 NOTE — Telephone Encounter (Signed)
Patient Information:  Caller Name: [name]  Phone: [phone]  Patient: Jasmine RoyaltyDick, Jasmine E  Gender: Female  DOB: [birth year]  Age: 63 Years  PCP: Roxy Mannsower, [place] [facility](Family Practice)  Office Follow Up:  Does the office need to follow up with this patient?: No  Instructions For The Office: N/A   Symptoms  Reason For Call & Symptoms: Patient calling about vertigo onset 02/19/13 while she was brushing her teeth. She also c/o nausea and sinus pressure.  Emergent symptoms ruled out. See Today in Office per Dizziness guideline due to Patient wants to be seen.  Reviewed Health History In EMR: Yes  Reviewed Medications In EMR: Yes  Reviewed Allergies In EMR: Yes  Reviewed Surgeries / Procedures: Yes  Date of Onset of Symptoms: 02/19/2013  Guideline(s) Used:  Dizziness  Disposition Per Guideline:   See Today in Office  Reason For Disposition Reached:   Patient wants to be seen  Advice Given:  Some Causes of Temporary Dizziness:  Standing Up Suddenly - Standing up suddenly (especially getting out of bed) or prolonged standing in one place are common causes of temporary dizziness. Not drinking enough fluids always makes it worse. Certain medications can cause or increase this type of dizziness (e.g., blood pressure medications).  Drink Fluids:  Drink several glasses of fruit juice, other clear fluids, or water. This will improve hydration and blood glucose. If you have a fever or have had heat exposure, make sure the fluids are cold.  Rest for 1-2 Hours:  Lie down with feet elevated for 1 hour. This will improve blood flow and increase blood flow to the brain.  Call Back If:  You become worse.  RN Overrode Recommendation:  Document Patient  Patient requests appointment on 02/20/13 or 02/21/13.  She declined to go to another [facility] office.  Appointment with Dr. Patsy Lageropland at 17:30 on 02/21/13.

## 2013-02-21 ENCOUNTER — Ambulatory Visit (INDEPENDENT_AMBULATORY_CARE_PROVIDER_SITE_OTHER): Admitting: Family Medicine

## 2013-02-21 ENCOUNTER — Encounter: Payer: Self-pay | Admitting: Family Medicine

## 2013-02-21 VITALS — BP 140/90 | HR 73 | Temp 97.8°F | Ht 63.0 in | Wt 141.5 lb

## 2013-02-21 DIAGNOSIS — R42 Dizziness and giddiness: Secondary | ICD-10-CM

## 2013-02-21 DIAGNOSIS — J0100 Acute maxillary sinusitis, unspecified: Secondary | ICD-10-CM

## 2013-02-21 MED ORDER — MECLIZINE HCL 25 MG PO TABS: 25.0000 mg | ORAL_TABLET | Freq: Three times a day (TID) | ORAL | Status: DC | PRN

## 2013-02-21 MED ORDER — AMOXICILLIN-POT CLAVULANATE 875-125 MG PO TABS: 1.0000 | ORAL_TABLET | Freq: Two times a day (BID) | ORAL | Status: DC

## 2013-02-21 NOTE — Progress Notes (Signed)
Patient Name: Jasmine Rollins Date of Birth: 1950/02/25 Medical Record Number: [id number]  History of Present Illness:  Patent presents with runny nose, sneezing, cough, sore throat, malaise and minimal / low-grade fever for > 1 week. Now the primary complaint has become sinus pressure and pain behind the eyes and in the upper, anterior face.  Having some bad vertigo and sinuses are hurting a whole lot. Lightheaded and congested. Congested.   Did have some vertigo.   The patent denies sore throat as the primary complaint. Denies sthortness of breath/wheezing, high fever, chest pain, significant myalgia, otalgia, abdominal pain, changes in bowel or bladder.  PMH, PHS, Allergies, Problem List, Medications, Family History, and Social History have all been reviewed.  Patient Active Problem List   Diagnosis Date Noted  . Acute bacterial sinusitis 09/19/2012  . Gynecological examination 06/22/2010  . Special screening for malignant neoplasms, colon 06/22/2010  . Glaucoma suspect 06/22/2010  . Routine general medical examination at a health care facility 06/03/2010  . Sleep apnea   . OSTEOPOROSIS 05/20/2009  . BACK PAIN, LUMBAR 02/02/2009  . SLEEP APNEA 10/17/2008  . CERVICAL LYMPHADENOPATHY 09/16/2008  . COUGH VARIANT ASTHMA 07/30/2008  . LEUKOCYTOPENIA UNSPECIFIED 01/29/2008  . Hyperglycemia 11/27/2007  . HYPERCHOLESTEROLEMIA 03/21/2007  . HYPERTENSION 03/21/2007  . SINUSITIS, RECURRENT 03/21/2007  . ALLERGIC RHINITIS 03/21/2007  . DIVERTICULOSIS, COLON 03/21/2007  . LIVER FUNCTION TESTS, ABNORMAL 03/21/2007    Past Medical History  Diagnosis Date  . Allergic rhinitis   . Diverticulosis of colon   . HTN (hypertension)   . Cough variant asthma   . Borderline high cholesterol   . Hyperglycemia     borderline DM  . GERD (gastroesophageal reflux disease)   . OP (osteoporosis)   . Sleep apnea     Past Surgical History  Procedure Laterality Date  . Tubal ligation    .  Colposcopy    . Rotator cuff repair      right  . Esophagogastroduodenoscopy  10/01    Negative    History   Social History  . Marital Status: Married    Spouse Name: N/A    Number of Children: 1  . Years of Education: N/A   Occupational History  . Customer Service    Social History Main Topics  . Smoking status: Never Smoker   . Smokeless tobacco: Never Used     Comment: Remote 2nd hand exposure  . Alcohol Use: No  . Drug Use: No  . Sexual Activity: Not on file   Other Topics Concern  . Not on file   Social History Narrative  . No narrative on file    Family History  Problem Relation Age of Onset  . Hypertension Father   . Diabetes Father   . Diabetes Brother   . Diabetes Brother   . Cancer Brother     prostate  . Cancer Brother     prostate  . Cancer Brother     prostate  . Cancer Brother     prostate  . Cancer Brother     prostate  . Cancer Brother     prostate    Allergies  Allergen Reactions  . Sulfonamide Derivatives     Medication list reviewed and updated in full in [place] [facility].  Review of Systems: as above, eating and drinking - tolerating PO. Urinating normally. No excessive vomitting or diarrhea. O/w as above.  Physical Exam:  Filed Vitals:   02/21/13 1729  BP: 140/90  Pulse: 73  Temp: 97.8 F (36.6 C)  TempSrc: Oral  Height: 5\' 3"  (1.6 m)  Weight: 141 lb 8 oz (64.184 kg)    GEN: WDWN, Non-toxic, Atraumatic, normocephalic. A and O x 3. HEENT: Oropharynx clear without exudate, MMM, no significant LAD, mild rhinnorhea Sinuses: Right Frontal, ethmoid, and maxillary: Tender Left Frontal, Ethmoid, and maxillary: Tender Ears: TM clear, COL visualized with good landmarks CV: RRR, no m/g/r. Pulm: CTA B, no wheezes, rhonchi, or crackles, normal respiratory effort. EXT: no c/c/e Psych: well oriented, neither depressed nor anxious in appearance  Maxillary sinusitis, acute  Vertigo    Acute sinusitis: ABX as below.     Reviewed symptomatic care as well as ABX in this case.  Supportive care for vertigo  Meds ordered this encounter  Medications  . amoxicillin-clavulanate (AUGMENTIN) 875-125 MG per tablet    Sig: Take 1 tablet by mouth 2 (two) times daily.    Dispense:  20 tablet    Refill:  0  . meclizine (ANTIVERT) 25 MG tablet    Sig: Take 1 tablet (25 mg total) by mouth 3 (three) times daily as needed for dizziness.    Dispense:  30 tablet    Refill:  0    Patient Instructions Reviewed: SINUSITIS Sinuses are cavities in facial skeleton that drain to nose. Impaired drainage and obstruction of sinus passages main cause.  Treatment: 1. Take all Antibiotics 2. Open nasal and sinus canals: Oral decongestant: Sudafed. (CAUTION IF HIGH BLOOD PRESSURE) 3. Steam inhalation 4. Humidifier in room 5. Frequent nasal saline irrigation 6. Moist heat compresses to face 7. Tylenol or Ibuprofen for pain and fever, follow directions on bottle.   Signed,  Elpidio GaleaSpencer T. Rinaldo Macqueen, MD, CAQ Sports Medicine  [facility] [address] [place] [address] Phone: [phone] Fax: [phone]

## 2013-02-21 NOTE — Progress Notes (Signed)
Pre-visit discussion using our clinic review tool. No additional management support is needed unless otherwise documented below in the visit note.  

## 2013-02-25 ENCOUNTER — Other Ambulatory Visit

## 2013-02-26 ENCOUNTER — Telehealth (INDEPENDENT_AMBULATORY_CARE_PROVIDER_SITE_OTHER): Admitting: Family Medicine

## 2013-02-26 ENCOUNTER — Other Ambulatory Visit (INDEPENDENT_AMBULATORY_CARE_PROVIDER_SITE_OTHER)

## 2013-02-26 DIAGNOSIS — E7800 Pure hypercholesterolemia, unspecified: Secondary | ICD-10-CM

## 2013-02-26 DIAGNOSIS — M810 Age-related osteoporosis without current pathological fracture: Secondary | ICD-10-CM

## 2013-02-26 DIAGNOSIS — R945 Abnormal results of liver function studies: Secondary | ICD-10-CM

## 2013-02-26 DIAGNOSIS — Z0000 Encounter for general adult medical examination without abnormal findings: Secondary | ICD-10-CM

## 2013-02-26 DIAGNOSIS — D72819 Decreased white blood cell count, unspecified: Secondary | ICD-10-CM

## 2013-02-26 DIAGNOSIS — R739 Hyperglycemia, unspecified: Secondary | ICD-10-CM

## 2013-02-26 DIAGNOSIS — R7309 Other abnormal glucose: Secondary | ICD-10-CM

## 2013-02-26 LAB — CBC WITH DIFFERENTIAL/PLATELET
BASOS ABS: 0 10*3/uL (ref 0.0–0.1)
Basophils Relative: 0.6 % (ref 0.0–3.0)
EOS PCT: 2.6 % (ref 0.0–5.0)
Eosinophils Absolute: 0.1 10*3/uL (ref 0.0–0.7)
HEMATOCRIT: 42.4 % (ref 36.0–46.0)
HEMOGLOBIN: 14.2 g/dL (ref 12.0–15.0)
LYMPHS ABS: 2 10*3/uL (ref 0.7–4.0)
Lymphocytes Relative: 50.7 % — ABNORMAL HIGH (ref 12.0–46.0)
MCHC: 33.5 g/dL (ref 30.0–36.0)
MCV: 93.1 fl (ref 78.0–100.0)
MONO ABS: 0.3 10*3/uL (ref 0.1–1.0)
MONOS PCT: 7.7 % (ref 3.0–12.0)
NEUTROS ABS: 1.5 10*3/uL (ref 1.4–7.7)
Neutrophils Relative %: 38.4 % — ABNORMAL LOW (ref 43.0–77.0)
PLATELETS: 243 10*3/uL (ref 150.0–400.0)
RBC: 4.56 Mil/uL (ref 3.87–5.11)
RDW: 13.1 % (ref 11.5–14.6)
WBC: 4 10*3/uL — ABNORMAL LOW (ref 4.5–10.5)

## 2013-02-26 LAB — COMPREHENSIVE METABOLIC PANEL
ALK PHOS: 106 U/L (ref 39–117)
ALT: 71 U/L — ABNORMAL HIGH (ref 0–35)
AST: 62 U/L — ABNORMAL HIGH (ref 0–37)
Albumin: 4.1 g/dL (ref 3.5–5.2)
BILIRUBIN TOTAL: 0.8 mg/dL (ref 0.3–1.2)
BUN: 10 mg/dL (ref 6–23)
CO2: 30 meq/L (ref 19–32)
CREATININE: 0.8 mg/dL (ref 0.4–1.2)
Calcium: 9.7 mg/dL (ref 8.4–10.5)
Chloride: 106 mEq/L (ref 96–112)
GFR: 94.35 mL/min (ref >60.00)
GLUCOSE: 108 mg/dL — AB (ref 70–99)
Potassium: 4.4 mEq/L (ref 3.5–5.1)
SODIUM: 143 meq/L (ref 135–145)
TOTAL PROTEIN: 7.8 g/dL (ref 6.0–8.3)

## 2013-02-26 LAB — LIPID PANEL
CHOLESTEROL: 204 mg/dL — AB (ref 0–200)
HDL: 67.9 mg/dL (ref >39.00)
TRIGLYCERIDES: 104 mg/dL (ref 0.0–149.0)
Total CHOL/HDL Ratio: 3
VLDL: 20.8 mg/dL (ref 0.0–40.0)

## 2013-02-26 LAB — HEMOGLOBIN A1C: Hgb A1c MFr Bld: 6.3 % (ref 4.6–6.5)

## 2013-02-26 LAB — LDL CHOLESTEROL, DIRECT: Direct LDL: 106.4 mg/dL

## 2013-02-26 LAB — TSH: TSH: 3.92 u[IU]/mL (ref 0.35–5.50)

## 2013-02-26 NOTE — Telephone Encounter (Signed)
Lab orders cpx

## 2013-02-27 LAB — VITAMIN D 25 HYDROXY (VIT D DEFICIENCY, FRACTURES): Vit D, 25-Hydroxy: 47 ng/mL (ref 30–89)

## 2013-03-01 ENCOUNTER — Ambulatory Visit (INDEPENDENT_AMBULATORY_CARE_PROVIDER_SITE_OTHER): Admitting: Family Medicine

## 2013-03-01 ENCOUNTER — Encounter: Payer: Self-pay | Admitting: Family Medicine

## 2013-03-01 VITALS — BP 130/86 | HR 89 | Temp 98.2°F | Ht 62.25 in | Wt 135.2 lb

## 2013-03-01 DIAGNOSIS — R7309 Other abnormal glucose: Secondary | ICD-10-CM

## 2013-03-01 DIAGNOSIS — Z0000 Encounter for general adult medical examination without abnormal findings: Secondary | ICD-10-CM

## 2013-03-01 DIAGNOSIS — E7800 Pure hypercholesterolemia, unspecified: Secondary | ICD-10-CM

## 2013-03-01 DIAGNOSIS — R748 Abnormal levels of other serum enzymes: Secondary | ICD-10-CM

## 2013-03-01 DIAGNOSIS — R12 Heartburn: Secondary | ICD-10-CM

## 2013-03-01 DIAGNOSIS — R739 Hyperglycemia, unspecified: Secondary | ICD-10-CM

## 2013-03-01 DIAGNOSIS — M810 Age-related osteoporosis without current pathological fracture: Secondary | ICD-10-CM

## 2013-03-01 DIAGNOSIS — I10 Essential (primary) hypertension: Secondary | ICD-10-CM

## 2013-03-01 MED ORDER — MOMETASONE FUROATE 220 MCG/INH IN AEPB: 1.0000 | INHALATION_SPRAY | Freq: Every day | RESPIRATORY_TRACT | Status: DC | PRN

## 2013-03-01 MED ORDER — RANITIDINE HCL 150 MG PO TABS
150.0000 mg | ORAL_TABLET | Freq: Two times a day (BID) | ORAL | Status: DC
Start: 2013-03-01 — End: 2013-12-06

## 2013-03-01 MED ORDER — OLMESARTAN MEDOXOMIL-HCTZ 40-12.5 MG PO TABS: ORAL_TABLET | ORAL | Status: DC

## 2013-03-01 NOTE — Progress Notes (Signed)
Pre-visit discussion using our clinic review tool. No additional management support is needed unless otherwise documented below in the visit note.  

## 2013-03-01 NOTE — Patient Instructions (Signed)
Keep working on a low sugar diet and low fat also Try zantac 150 mg twice daily for acid reflux/ heartburn  A probiotic is fine  Cut out dairy if it bothers you  We will watch liver labs Follow up in 6 months with labs prior

## 2013-03-01 NOTE — Progress Notes (Signed)
Subjective:    Patient ID: Jasmine Rollins, female    DOB: 08/29/1950, 63 y.o.   MRN: [id number]  HPI Here for health maintenance exam and to review chronic medical problems    Wt is down 6 lb  bmi is 24  Getting over a sinus infection - still a little light headed - she takes meclizine   More indigestion at night - she takes alka selzer - it helps Also more gas - both at night  Has not tried zantac  No imp with gas ex  No nausea  ? Perhaps worse from yogurt -- (lactose intol) - eating too much of it lately   mammo 4/13- overdue for her mammogram - will make her own appt at [facility]  Self exam- no lumps    Pap 5/12 - is up to date  No gyn  symptoms or new partners   Flu shot 10/14   colonosc 1/07 - 10 year recall   Td 5/12   zostervaccine 6/12   bp is stable today  No cp or palpitations or headaches or edema  No side effects to medicines  BP Readings from Last 3 Encounters:  03/01/13 130/86  02/21/13 140/90  09/19/12 132/92    Some white coat component   Still getting over a sinus infection   Hyperlipidemia  Lab Results  Component Value Date   CHOL 204* 02/26/2013   CHOL 213* 09/27/2011   CHOL 195 06/17/2010   Lab Results  Component Value Date   HDL 67.90 02/26/2013   HDL 40.98 09/27/2011   HDL 11.91 06/17/2010   Lab Results  Component Value Date   LDLCALC 110* 06/17/2010   LDLCALC 97 01/26/2009   Lab Results  Component Value Date   TRIG 104.0 02/26/2013   TRIG 61.0 09/27/2011   TRIG 74.0 06/17/2010   Lab Results  Component Value Date   CHOLHDL 3 02/26/2013   CHOLHDL 2 09/27/2011   CHOLHDL 3 06/17/2010   Lab Results  Component Value Date   LDLDIRECT 106.4 02/26/2013   LDLDIRECT 104.2 09/27/2011   LDLDIRECT 86.7 11/27/2007   cholesterol is in fairly good control - and lower HDL - will go back to exercise    Chemistry      Component Value Date/Time   NA 143 02/26/2013 1021   K 4.4 02/26/2013 1021   CL 106 02/26/2013 1021   CO2 30 02/26/2013 1021   BUN  10 02/26/2013 1021   CREATININE 0.8 02/26/2013 1021      Component Value Date/Time   CALCIUM 9.7 02/26/2013 1021   ALKPHOS 106 02/26/2013 1021   AST 62* 02/26/2013 1021   ALT 71* 02/26/2013 1021   BILITOT 0.8 02/26/2013 1021     taking some advil lately - no tylenol  No alcohol  Been up and down in the past   Sugar was 108 Lab Results  Component Value Date   HGBA1C 6.3 02/26/2013   stable - pre diabetic  Does eat too many sweets - but cut back on adding sugar to things   Lab Results  Component Value Date   TSH 3.92 02/26/2013      Patient Active Problem List   Diagnosis Date Noted  . Acute bacterial sinusitis 09/19/2012  . Gynecological examination 06/22/2010  . Special screening for malignant neoplasms, colon 06/22/2010  . Glaucoma suspect 06/22/2010  . Routine general medical examination at a health care facility 06/03/2010  . Sleep apnea   . OSTEOPOROSIS 05/20/2009  .  BACK PAIN, LUMBAR 02/02/2009  . SLEEP APNEA 10/17/2008  . CERVICAL LYMPHADENOPATHY 09/16/2008  . COUGH VARIANT ASTHMA 07/30/2008  . LEUKOCYTOPENIA UNSPECIFIED 01/29/2008  . Hyperglycemia 11/27/2007  . HYPERCHOLESTEROLEMIA 03/21/2007  . HYPERTENSION 03/21/2007  . SINUSITIS, RECURRENT 03/21/2007  . ALLERGIC RHINITIS 03/21/2007  . DIVERTICULOSIS, COLON 03/21/2007  . LIVER FUNCTION TESTS, ABNORMAL 03/21/2007   Past Medical History  Diagnosis Date  . Allergic rhinitis   . Diverticulosis of colon   . HTN (hypertension)   . Cough variant asthma   . Borderline high cholesterol   . Hyperglycemia     borderline DM  . GERD (gastroesophageal reflux disease)   . OP (osteoporosis)   . Sleep apnea    Past Surgical History  Procedure Laterality Date  . Tubal ligation    . Colposcopy    . Rotator cuff repair      right  . Esophagogastroduodenoscopy  10/01    Negative   History  Substance Use Topics  . Smoking status: Never Smoker   . Smokeless tobacco: Never Used     Comment: Remote 2nd hand exposure    . Alcohol Use: No   Family History  Problem Relation Age of Onset  . Hypertension Father   . Diabetes Father   . Diabetes Brother   . Diabetes Brother   . Cancer Brother     prostate  . Cancer Brother     prostate  . Cancer Brother     prostate  . Cancer Brother     prostate  . Cancer Brother     prostate  . Cancer Brother     prostate   Allergies  Allergen Reactions  . Sulfonamide Derivatives    Current Outpatient Prescriptions on File Prior to Visit  Medication Sig Dispense Refill  . amoxicillin-clavulanate (AUGMENTIN) 875-125 MG per tablet Take 1 tablet by mouth 2 (two) times daily.  20 tablet  0  . BENICAR HCT 40-12.5 MG per tablet TAKE 1 TABLET DAILY  90 tablet  2  . calcium carbonate (OS-CAL) 600 MG TABS Take 600 mg by mouth 2 (two) times daily with a meal.        . Cholecalciferol (VITAMIN D) 400 UNITS capsule Take 400 Units by mouth daily.        . fexofenadine (ALLEGRA) 180 MG tablet Take 180 mg by mouth daily.        . meclizine (ANTIVERT) 25 MG tablet Take 1 tablet (25 mg total) by mouth 3 (three) times daily as needed for dizziness.  30 tablet  0  . mometasone (ASMANEX) 220 MCG/INH inhaler Inhale 1 puff into the lungs daily as needed.  3 Inhaler  3  . NASONEX 50 MCG/ACT nasal spray PLACE 2 SPRAYS IN THE NOSE DAILY  17 g  3  . PROVENTIL HFA 108 (90 BASE) MCG/ACT inhaler INHALE 2 PUFFS EVERY 4 HOURS AS NEEDED  6 each  2   No current facility-administered medications on file prior to visit.    Review of Systems Review of Systems  Constitutional: Negative for fever, appetite change, fatigue and unexpected weight change.  Eyes: Negative for pain and visual disturbance.  Respiratory: Negative for cough and shortness of breath.   Cardiovascular: Negative for cp or palpitations    Gastrointestinal: Negative for nausea, diarrhea and constipation. pos for heartburn  Genitourinary: Negative for urgency and frequency.  Skin: Negative for pallor or rash    Neurological: Negative for weakness, light-headedness, numbness and headaches.  Hematological: Negative for  adenopathy. Does not bruise/bleed easily.  Psychiatric/Behavioral: Negative for dysphoric mood. The patient is not nervous/anxious.         Objective:   Physical Exam  Constitutional: She appears well-developed and well-nourished. No distress.  HENT:  Head: Normocephalic and atraumatic.  Right Ear: External ear normal.  Left Ear: External ear normal.  Mouth/Throat: Oropharynx is clear and moist.  Eyes: Conjunctivae and EOM are normal. Pupils are equal, round, and reactive to light. No scleral icterus.  Neck: Normal range of motion. Neck supple. No JVD present. Carotid bruit is not present. No thyromegaly present.  Cardiovascular: Normal rate, regular rhythm, normal heart sounds and intact distal pulses.  Exam reveals no gallop.   Pulmonary/Chest: Effort normal and breath sounds normal. No respiratory distress. She has no wheezes. She exhibits no tenderness.  Abdominal: Soft. Bowel sounds are normal. She exhibits no distension, no abdominal bruit and no mass. There is no tenderness.  Genitourinary: No breast swelling, tenderness, discharge or bleeding.  Breast exam: No mass, nodules, thickening, tenderness, bulging, retraction, inflamation, nipple discharge or skin changes noted.  No axillary or clavicular LA.    Musculoskeletal: Normal range of motion. She exhibits no edema and no tenderness.  Lymphadenopathy:    She has no cervical adenopathy.  Neurological: She is alert. She has normal reflexes. No cranial nerve deficit. She exhibits normal muscle tone. Coordination normal.  Skin: Skin is warm and dry. No rash noted. No erythema. No pallor.  Psychiatric: She has a normal mood and affect.          Assessment & Plan:

## 2013-03-02 ENCOUNTER — Telehealth: Payer: Self-pay | Admitting: Family Medicine

## 2013-03-02 NOTE — Telephone Encounter (Signed)
Relevant patient education assigned to patient using Emmi. ° °

## 2013-03-03 NOTE — Assessment & Plan Note (Signed)
Stable Lab Results  Component Value Date   HGBA1C 6.3 02/26/2013   Disc low glycemic diet and exercise

## 2013-03-03 NOTE — Assessment & Plan Note (Signed)
Disc goals for lipids and reasons to control them Rev labs with pt Rev low sat fat diet in detail  Overall stable  Watching LFTs

## 2013-03-03 NOTE — Assessment & Plan Note (Signed)
Reviewed health habits including diet and exercise and skin cancer prevention Reviewed appropriate screening tests for age  Also reviewed health mt list, fam hx and immunization status , as well as social and family history   Lab reviewed  

## 2013-03-03 NOTE — Assessment & Plan Note (Signed)
BP: 130/86 mmHg  bp in fair control at this time  No changes needed Disc lifstyle change with low sodium diet and exercise   Labs reviewed

## 2013-03-03 NOTE — Assessment & Plan Note (Signed)
Disc need for calcium/ vitamin D/ wt bearing exercise and bone density test every 2 y to monitor Disc safety/ fracture risk in detail    

## 2013-03-03 NOTE — Assessment & Plan Note (Signed)
This is new Primarily at night  Will start zantac 150 bid  Rev reflux diet and need to not eat late/ before bed  Will update if not imp in 1-2 weeks

## 2013-03-03 NOTE — Assessment & Plan Note (Signed)
This persists Disc poss of fatty liver-though pt is not obese  Will continue to follow inst to avoid tylenol/ and alcohol 6 mo f/u

## 2013-03-07 ENCOUNTER — Telehealth: Payer: Self-pay | Admitting: *Deleted

## 2013-03-07 ENCOUNTER — Other Ambulatory Visit: Payer: Self-pay

## 2013-03-07 DIAGNOSIS — Z1231 Encounter for screening mammogram for malignant neoplasm of breast: Secondary | ICD-10-CM

## 2013-03-07 NOTE — Telephone Encounter (Signed)
Received prior auth request on Asmanex. Auth paperwork obtained and placed in your inbox.

## 2013-03-08 NOTE — Telephone Encounter (Signed)
In IN box- they may deny it and make her try flovvent - we will see

## 2013-03-11 NOTE — Telephone Encounter (Signed)
Paperwork faxed to [employer], pending response.

## 2013-03-15 ENCOUNTER — Other Ambulatory Visit: Payer: Self-pay | Admitting: Family Medicine

## 2013-03-15 MED ORDER — ALBUTEROL SULFATE HFA 108 (90 BASE) MCG/ACT IN AERS: INHALATION_SPRAY | RESPIRATORY_TRACT | Status: DC

## 2013-03-15 NOTE — Telephone Encounter (Signed)
Pt had years supply of Rx sent to local pharmacy, Rx should have went to mail order pharmacy. I called and cancelled Rx at local pharmacy and sent Rx to mail order pharmacy, per pt

## 2013-03-18 NOTE — Telephone Encounter (Signed)
Still pending response from insurance, I will resubmit forms.

## 2013-03-20 NOTE — Telephone Encounter (Signed)
Approval form received by insurance company. Will send to provider for signature, then to be scanned into patients medical record. Pharmacy notified via fax.  

## 2013-03-20 NOTE — Telephone Encounter (Signed)
approval

## 2013-04-03 ENCOUNTER — Ambulatory Visit

## 2013-04-19 ENCOUNTER — Ambulatory Visit
Admission: RE | Admit: 2013-04-19 | Discharge: 2013-04-19 | Disposition: A | Discharge status: Home / Self Care | Payer: Self-pay | Source: Ambulatory Visit

## 2013-04-19 DIAGNOSIS — Z1231 Encounter for screening mammogram for malignant neoplasm of breast: Secondary | ICD-10-CM

## 2013-08-23 ENCOUNTER — Other Ambulatory Visit (INDEPENDENT_AMBULATORY_CARE_PROVIDER_SITE_OTHER)

## 2013-08-23 DIAGNOSIS — R748 Abnormal levels of other serum enzymes: Secondary | ICD-10-CM

## 2013-08-23 DIAGNOSIS — R739 Hyperglycemia, unspecified: Secondary | ICD-10-CM

## 2013-08-23 DIAGNOSIS — R7309 Other abnormal glucose: Secondary | ICD-10-CM

## 2013-08-23 LAB — HEPATIC FUNCTION PANEL
ALK PHOS: 95 U/L (ref 39–117)
ALT: 26 U/L (ref 0–35)
AST: 22 U/L (ref 0–37)
Albumin: 4.2 g/dL (ref 3.5–5.2)
BILIRUBIN DIRECT: 0.1 mg/dL (ref 0.0–0.3)
BILIRUBIN TOTAL: 1.1 mg/dL (ref 0.2–1.2)
TOTAL PROTEIN: 7.8 g/dL (ref 6.0–8.3)

## 2013-08-23 LAB — HEMOGLOBIN A1C: Hgb A1c MFr Bld: 6.4 % (ref 4.6–6.5)

## 2013-08-30 ENCOUNTER — Ambulatory Visit (INDEPENDENT_AMBULATORY_CARE_PROVIDER_SITE_OTHER): Admitting: Family Medicine

## 2013-08-30 ENCOUNTER — Encounter: Payer: Self-pay | Admitting: Family Medicine

## 2013-08-30 VITALS — BP 135/85 | HR 69 | Temp 97.6°F | Ht 62.5 in | Wt 133.2 lb

## 2013-08-30 DIAGNOSIS — I10 Essential (primary) hypertension: Secondary | ICD-10-CM

## 2013-08-30 DIAGNOSIS — R739 Hyperglycemia, unspecified: Secondary | ICD-10-CM

## 2013-08-30 DIAGNOSIS — R7309 Other abnormal glucose: Secondary | ICD-10-CM

## 2013-08-30 DIAGNOSIS — R945 Abnormal results of liver function studies: Secondary | ICD-10-CM

## 2013-08-30 NOTE — Progress Notes (Signed)
Pre visit review using our clinic review tool, if applicable. No additional management support is needed unless otherwise documented below in the visit note. 

## 2013-08-30 NOTE — Progress Notes (Signed)
Subjective:    Patient ID: Jasmine Rollins, female    DOB: Sep 05, 1950, 63 y.o.   MRN: [id number]  HPI Here for f/u of multiple issues   Last visit her LFT s were elevated  Now they are normal  She wonders if it was from the herbal tea she ordered on line ? Unsure   Hyperglycemia Lab Results  Component Value Date   HGBA1C 6.4 08/23/2013   This is up from 6.3  She thought she was being careful with sugar  ? Too much starch She started exercising on the treadmill   Wt is down 2 lb   bp is stable today  No cp or palpitations or headaches or edema  No side effects to medicines  BP Readings from Last 3 Encounters:  08/30/13 135/85  03/01/13 130/86  02/21/13 140/90     Re check 135/85 Did not sleep last night well  Had an energy drink   Patient Active Problem List   Diagnosis Date Noted  . Heartburn 03/01/2013  . Elevated liver enzymes 03/01/2013  . Acute bacterial sinusitis 09/19/2012  . Gynecological examination 06/22/2010  . Special screening for malignant neoplasms, colon 06/22/2010  . Glaucoma suspect 06/22/2010  . Routine general medical examination at a health care facility 06/03/2010  . Sleep apnea   . OSTEOPOROSIS 05/20/2009  . BACK PAIN, LUMBAR 02/02/2009  . SLEEP APNEA 10/17/2008  . CERVICAL LYMPHADENOPATHY 09/16/2008  . COUGH VARIANT ASTHMA 07/30/2008  . LEUKOCYTOPENIA UNSPECIFIED 01/29/2008  . Hyperglycemia 11/27/2007  . HYPERCHOLESTEROLEMIA 03/21/2007  . HYPERTENSION 03/21/2007  . SINUSITIS, RECURRENT 03/21/2007  . ALLERGIC RHINITIS 03/21/2007  . DIVERTICULOSIS, COLON 03/21/2007   Past Medical History  Diagnosis Date  . Allergic rhinitis   . Diverticulosis of colon   . HTN (hypertension)   . Cough variant asthma   . Borderline high cholesterol   . Hyperglycemia     borderline DM  . GERD (gastroesophageal reflux disease)   . OP (osteoporosis)   . Sleep apnea    Past Surgical History  Procedure Laterality Date  . Tubal ligation    .  Colposcopy    . Rotator cuff repair      right  . Esophagogastroduodenoscopy  10/01    Negative   History  Substance Use Topics  . Smoking status: Never Smoker   . Smokeless tobacco: Never Used     Comment: Remote 2nd hand exposure  . Alcohol Use: No   Family History  Problem Relation Age of Onset  . Hypertension Father   . Diabetes Father   . Diabetes Brother   . Diabetes Brother   . Cancer Brother     prostate  . Cancer Brother     prostate  . Cancer Brother     prostate  . Cancer Brother     prostate  . Cancer Brother     prostate  . Cancer Brother     prostate   Allergies  Allergen Reactions  . Sulfonamide Derivatives    Current Outpatient Prescriptions on File Prior to Visit  Medication Sig Dispense Refill  . albuterol (PROVENTIL HFA) 108 (90 BASE) MCG/ACT inhaler INHALE 2 PUFFS EVERY 4 HOURS AS NEEDED  3 Inhaler  3  . BENICAR HCT 40-12.5 MG per tablet TAKE 1 TABLET DAILY  90 tablet  3  . calcium carbonate (OS-CAL) 600 MG TABS Take 600 mg by mouth 2 (two) times daily with a meal.        . Cholecalciferol (  VITAMIN D) 400 UNITS capsule Take 400 Units by mouth daily.        . fexofenadine (ALLEGRA) 180 MG tablet Take 180 mg by mouth daily.        . meclizine (ANTIVERT) 25 MG tablet Take 1 tablet (25 mg total) by mouth 3 (three) times daily as needed for dizziness.  30 tablet  0  . NASONEX 50 MCG/ACT nasal spray PLACE 2 SPRAYS IN THE NOSE DAILY  17 g  3  . ranitidine (ZANTAC) 150 MG tablet Take 1 tablet (150 mg total) by mouth 2 (two) times daily.  60 tablet  11  . mometasone (ASMANEX) 220 MCG/INH inhaler Inhale 1 puff into the lungs daily as needed.  3 Inhaler  3   No current facility-administered medications on file prior to visit.    Review of Systems     Objective:   Physical Exam  Constitutional: She appears well-developed and well-nourished. No distress.  HENT:  Head: Normocephalic and atraumatic.  Mouth/Throat: Oropharynx is clear and moist.  Eyes:  Conjunctivae and EOM are normal. Pupils are equal, round, and reactive to light. No scleral icterus.  Neck: Normal range of motion. Neck supple. No JVD present. Carotid bruit is not present. No thyromegaly present.  Cardiovascular: Normal rate, regular rhythm, normal heart sounds and intact distal pulses.  Exam reveals no gallop.   Pulmonary/Chest: Breath sounds normal. No respiratory distress. She has no wheezes. She has no rales.  Abdominal: Soft. Bowel sounds are normal. She exhibits no distension and no mass. There is no tenderness.  Musculoskeletal: She exhibits no edema.  Lymphadenopathy:    She has no cervical adenopathy.  Neurological: She is alert. She has normal reflexes. No cranial nerve deficit. She exhibits normal muscle tone. Coordination normal.  Skin: Skin is warm and dry. No rash noted. No pallor.  Psychiatric: She has a normal mood and affect.          Assessment & Plan:   Problem List Items Addressed This Visit     Cardiovascular and Mediastinum   HYPERTENSION - Primary     Re check bp was improved 135/85 Will continue to watch  Enc exercise       Other   Hyperglycemia      Lab Results  Component Value Date   HGBA1C 6.4 08/23/2013   Watching for DM Is prediabetic -disc low glycemic diet and exercise in detail Working on that  Lab in 6 mo and f/u     Other Visit Diagnoses   LIVER FUNCTION TESTS, ABNORMAL

## 2013-08-30 NOTE — Assessment & Plan Note (Signed)
This resolved after stopping her herbal tea   Will continue to watch

## 2013-08-30 NOTE — Patient Instructions (Addendum)
Liver tests normal today  Work on healthy low sugar and low fat diet and exercise to avoid diabetes  Blood pressure was better on 2nd check  Schedule annual exam in 6 months with labs prior

## 2013-08-30 NOTE — Assessment & Plan Note (Signed)
Re check bp was improved 135/85 Will continue to watch  Enc exercise

## 2013-08-30 NOTE — Assessment & Plan Note (Signed)
Lab Results  Component Value Date   HGBA1C 6.4 08/23/2013   Watching for DM Is prediabetic -disc low glycemic diet and exercise in detail Working on that  Lab in 6 mo and f/u

## 2013-12-06 ENCOUNTER — Ambulatory Visit (INDEPENDENT_AMBULATORY_CARE_PROVIDER_SITE_OTHER): Admitting: Family Medicine

## 2013-12-06 ENCOUNTER — Encounter: Payer: Self-pay | Admitting: Family Medicine

## 2013-12-06 VITALS — BP 130/80 | HR 69 | Temp 98.3°F | Ht 62.5 in | Wt 134.8 lb

## 2013-12-06 DIAGNOSIS — R21 Rash and other nonspecific skin eruption: Secondary | ICD-10-CM

## 2013-12-06 DIAGNOSIS — J45991 Cough variant asthma: Secondary | ICD-10-CM

## 2013-12-06 MED ORDER — MOMETASONE FUROATE 0.1 % EX CREA: 1.0000 "application " | TOPICAL_CREAM | Freq: Every day | CUTANEOUS | Status: DC

## 2013-12-06 MED ORDER — RANITIDINE HCL 150 MG PO TABS: 150.0000 mg | ORAL_TABLET | Freq: Two times a day (BID) | ORAL | Status: DC

## 2013-12-06 NOTE — Patient Instructions (Signed)
Try the elocon cream on affected area (rash) daily until better If no improvement in 2 weeks - please let me know  Get a list of covered alternatives (for asmanex)- to me so I can switch

## 2013-12-06 NOTE — Progress Notes (Signed)
Pre visit review using our clinic review tool, if applicable. No additional management support is needed unless otherwise documented below in the visit note. 

## 2013-12-06 NOTE — Assessment & Plan Note (Signed)
On nose - small areas of hyperpigmentation- flat to slt raised but smooth Some itch No scale or crust or drainage Trial of elocon cream Ref to derm if no imp

## 2013-12-06 NOTE — Progress Notes (Signed)
Subjective:    Patient ID: Jasmine Rollins, female    DOB: 1950-07-25, 63 y.o.   MRN: [id number]  HPI Here for a rash on her nose  It just appeared recently - within the past month and not going away  It itched and her face itched also  She does eat tomatoes -even though she is allergic to them - thought that was the cause   No rash or itching on body   Nose itches occasionally  Used her husband's cream ? What it was - px --no idea what it was - helped itch but not rash   Insurance  does not pay for asmanex  Has not tried anything else  Seldom needs to use rescue inhaler -once every 2-3 mo   Her symptoms are seasonal   Patient Active Problem List   Diagnosis Date Noted  . Rash and nonspecific skin eruption 12/06/2013  . Heartburn 03/01/2013  . Elevated liver enzymes 03/01/2013  . Gynecological examination 06/22/2010  . Special screening for malignant neoplasms, colon 06/22/2010  . Glaucoma suspect 06/22/2010  . Routine general medical examination at a health care facility 06/03/2010  . Sleep apnea   . OSTEOPOROSIS 05/20/2009  . BACK PAIN, LUMBAR 02/02/2009  . SLEEP APNEA 10/17/2008  . CERVICAL LYMPHADENOPATHY 09/16/2008  . COUGH VARIANT ASTHMA 07/30/2008  . LEUKOCYTOPENIA UNSPECIFIED 01/29/2008  . Hyperglycemia 11/27/2007  . HYPERCHOLESTEROLEMIA 03/21/2007  . HYPERTENSION 03/21/2007  . SINUSITIS, RECURRENT 03/21/2007  . ALLERGIC RHINITIS 03/21/2007  . DIVERTICULOSIS, COLON 03/21/2007   Past Medical History  Diagnosis Date  . Allergic rhinitis   . Diverticulosis of colon   . HTN (hypertension)   . Cough variant asthma   . Borderline high cholesterol   . Hyperglycemia     borderline DM  . GERD (gastroesophageal reflux disease)   . OP (osteoporosis)   . Sleep apnea    Past Surgical History  Procedure Laterality Date  . Tubal ligation    . Colposcopy    . Rotator cuff repair      right  . Esophagogastroduodenoscopy  10/01    Negative   History  Substance  Use Topics  . Smoking status: Never Smoker   . Smokeless tobacco: Never Used     Comment: Remote 2nd hand exposure  . Alcohol Use: No   Family History  Problem Relation Age of Onset  . Hypertension Father   . Diabetes Father   . Diabetes Brother   . Diabetes Brother   . Cancer Brother     prostate  . Cancer Brother     prostate  . Cancer Brother     prostate  . Cancer Brother     prostate  . Cancer Brother     prostate  . Cancer Brother     prostate   Allergies  Allergen Reactions  . Sulfonamide Derivatives    Current Outpatient Prescriptions on File Prior to Visit  Medication Sig Dispense Refill  . albuterol (PROVENTIL HFA) 108 (90 BASE) MCG/ACT inhaler INHALE 2 PUFFS EVERY 4 HOURS AS NEEDED  3 Inhaler  3  . BENICAR HCT 40-12.5 MG per tablet TAKE 1 TABLET DAILY  90 tablet  3  . calcium carbonate (OS-CAL) 600 MG TABS Take 600 mg by mouth 2 (two) times daily with a meal.        . Cholecalciferol (VITAMIN D) 400 UNITS capsule Take 400 Units by mouth daily.        . fexofenadine (ALLEGRA) 180 MG  tablet Take 180 mg by mouth daily.        . meclizine (ANTIVERT) 25 MG tablet Take 1 tablet (25 mg total) by mouth 3 (three) times daily as needed for dizziness.  30 tablet  0  . NASONEX 50 MCG/ACT nasal spray PLACE 2 SPRAYS IN THE NOSE DAILY  17 g  3   No current facility-administered medications on file prior to visit.     Review of Systems Review of Systems  Constitutional: Negative for fever, appetite change, fatigue and unexpected weight change.  Eyes: Negative for pain and visual disturbance.  Respiratory: Negative for cough and shortness of breath.   Cardiovascular: Negative for cp or palpitations    Gastrointestinal: Negative for nausea, diarrhea and constipation.  Genitourinary: Negative for urgency and frequency.  Skin: Negative for pallor and pos for itchy rash just on her nose (neg for new exp or products) Neurological: Negative for weakness, light-headedness,  numbness and headaches.  Hematological: Negative for adenopathy. Does not bruise/bleed easily.  Psychiatric/Behavioral: Negative for dysphoric mood. The patient is not nervous/anxious.         Objective:   Physical Exam  Constitutional: She appears well-developed and well-nourished. No distress.  HENT:  Head: Normocephalic and atraumatic.  Right Ear: External ear normal.  Left Ear: External ear normal.  Nose: Nose normal.  Mouth/Throat: Oropharynx is clear and moist.  Eyes: Conjunctivae and EOM are normal. Pupils are equal, round, and reactive to light. Right eye exhibits no discharge. Left eye exhibits no discharge.  Neck: Normal range of motion. Neck supple. No thyromegaly present.  Cardiovascular: Normal rate, regular rhythm and normal heart sounds.   Pulmonary/Chest: Effort normal and breath sounds normal. No respiratory distress. She has no wheezes. She has no rales.  No wheeze even on forced exp  Musculoskeletal: She exhibits no edema.  Lymphadenopathy:    She has no cervical adenopathy.  Neurological: She is alert.  Skin: Skin is warm and dry. No pallor.  Several small slt raised hyperpigmented areas on nose No vesicles Non tender  No excoriations  No drainage or crust or swelling   Psychiatric: She has a normal mood and affect.          Assessment & Plan:   Problem List Items Addressed This Visit     Respiratory   COUGH VARIANT ASTHMA     Ins no longer covers her asmanex which works well for her  She will find out what covered alt are and we will pick Her season is here        Musculoskeletal and Integument   Rash and nonspecific skin eruption - Primary     On nose - small areas of hyperpigmentation- flat to slt raised but smooth Some itch No scale or crust or drainage Trial of elocon cream Ref to derm if no imp

## 2013-12-06 NOTE — Assessment & Plan Note (Signed)
Ins no longer covers her asmanex which works well for her  She will find out what covered alt are and we will pick Her season is here

## 2014-02-01 ENCOUNTER — Other Ambulatory Visit: Payer: Self-pay | Admitting: Family Medicine

## 2014-02-23 ENCOUNTER — Telehealth: Payer: Self-pay | Admitting: Family Medicine

## 2014-02-23 DIAGNOSIS — M810 Age-related osteoporosis without current pathological fracture: Secondary | ICD-10-CM

## 2014-02-23 DIAGNOSIS — R739 Hyperglycemia, unspecified: Secondary | ICD-10-CM

## 2014-02-23 DIAGNOSIS — Z0000 Encounter for general adult medical examination without abnormal findings: Secondary | ICD-10-CM

## 2014-02-23 NOTE — Telephone Encounter (Signed)
-----   Message from Terri J Walsh sent at 02/19/2014  2:22 PM EST ----- Regarding: Lab orders for Monday, 1.11.16 Patient is scheduled for CPX labs, please order future labs, Thanks , Terri  

## 2014-02-24 ENCOUNTER — Encounter: Payer: Self-pay | Admitting: Family Medicine

## 2014-02-24 ENCOUNTER — Ambulatory Visit (INDEPENDENT_AMBULATORY_CARE_PROVIDER_SITE_OTHER): Admitting: Family Medicine

## 2014-02-24 ENCOUNTER — Other Ambulatory Visit (INDEPENDENT_AMBULATORY_CARE_PROVIDER_SITE_OTHER)

## 2014-02-24 VITALS — BP 136/88 | HR 76 | Temp 98.3°F | Wt 134.5 lb

## 2014-02-24 DIAGNOSIS — R739 Hyperglycemia, unspecified: Secondary | ICD-10-CM

## 2014-02-24 DIAGNOSIS — J069 Acute upper respiratory infection, unspecified: Secondary | ICD-10-CM

## 2014-02-24 DIAGNOSIS — M810 Age-related osteoporosis without current pathological fracture: Secondary | ICD-10-CM

## 2014-02-24 DIAGNOSIS — B9789 Other viral agents as the cause of diseases classified elsewhere: Principal | ICD-10-CM

## 2014-02-24 DIAGNOSIS — Z0000 Encounter for general adult medical examination without abnormal findings: Secondary | ICD-10-CM

## 2014-02-24 LAB — CBC WITH DIFFERENTIAL/PLATELET
BASOS PCT: 0.5 % (ref 0.0–3.0)
Basophils Absolute: 0 10*3/uL (ref 0.0–0.1)
EOS ABS: 0.2 10*3/uL (ref 0.0–0.7)
EOS PCT: 5.3 % — AB (ref 0.0–5.0)
HEMATOCRIT: 43.4 % (ref 36.0–46.0)
Hemoglobin: 14.4 g/dL (ref 12.0–15.0)
LYMPHS ABS: 1.2 10*3/uL (ref 0.7–4.0)
Lymphocytes Relative: 33.8 % (ref 12.0–46.0)
MCHC: 33.1 g/dL (ref 30.0–36.0)
MCV: 93.6 fl (ref 78.0–100.0)
Monocytes Absolute: 0.3 10*3/uL (ref 0.1–1.0)
Monocytes Relative: 8.5 % (ref 3.0–12.0)
NEUTROS ABS: 1.8 10*3/uL (ref 1.4–7.7)
Neutrophils Relative %: 51.9 % (ref 43.0–77.0)
PLATELETS: 274 10*3/uL (ref 150.0–400.0)
RBC: 4.64 Mil/uL (ref 3.87–5.11)
RDW: 13.3 % (ref 11.5–15.5)
WBC: 3.5 10*3/uL — ABNORMAL LOW (ref 4.0–10.5)

## 2014-02-24 LAB — LIPID PANEL
CHOL/HDL RATIO: 3
CHOLESTEROL: 206 mg/dL — AB (ref 0–200)
HDL: 66.2 mg/dL (ref >39.00)
LDL Cholesterol: 119 mg/dL — ABNORMAL HIGH (ref 0–99)
NONHDL: 139.8
Triglycerides: 103 mg/dL (ref 0.0–149.0)
VLDL: 20.6 mg/dL (ref 0.0–40.0)

## 2014-02-24 LAB — HEMOGLOBIN A1C: Hgb A1c MFr Bld: 6.6 % — ABNORMAL HIGH (ref 4.6–6.5)

## 2014-02-24 LAB — VITAMIN D 25 HYDROXY (VIT D DEFICIENCY, FRACTURES): VITD: 24.55 ng/mL — ABNORMAL LOW (ref 30.00–100.00)

## 2014-02-24 LAB — TSH: TSH: 3.52 u[IU]/mL (ref 0.35–4.50)

## 2014-02-24 MED ORDER — GUAIFENESIN-CODEINE 100-10 MG/5ML PO SYRP: 5.0000 mL | ORAL_SOLUTION | Freq: Every evening | ORAL | Status: DC | PRN

## 2014-02-24 NOTE — Progress Notes (Signed)
BP 136/88 mmHg  Pulse 76  Temp(Src) 98.3 F (36.8 C) (Oral)  Wt 134 lb 8 oz (61.009 kg)  SpO2 99%   CC: cold sxs  Subjective:    Patient ID: Jasmine Rollins, female    DOB: 09/24/1950, 64 y.o.   MRN: [id number]  HPI: LYLLIAN GAUSE is a 64 y.o. female presenting on 02/24/2014 for URI   Patient of Dr Royden Purl new to me with cough variant asthma, allergic rhinitis, hypertension and prediabetes presents with 4d h/o cough, congestion. + R earache. Mildly productive cough. Sleeping ok. No coughing fits.  Recently at 4 funerals. Thinks picked up a bug at one of the funerals.    No fever, tooth pain, headache. No dyspnea or wheezing.  So far has tried theraflu.  No smokers at home. + around sick contacts.  Relevant past medical, surgical, family and social history reviewed and updated as indicated. Interim medical history since our last visit reviewed. Allergies and medications reviewed and updated. Current Outpatient Prescriptions on File Prior to Visit  Medication Sig  . albuterol (PROVENTIL HFA) 108 (90 BASE) MCG/ACT inhaler INHALE 2 PUFFS EVERY 4 HOURS AS NEEDED  . BENICAR HCT 40-12.5 MG per tablet TAKE 1 TABLET DAILY  . calcium carbonate (OS-CAL) 600 MG TABS Take 600 mg by mouth 2 (two) times daily with a meal.    . Cholecalciferol (VITAMIN D) 400 UNITS capsule Take 400 Units by mouth daily.    . fexofenadine (ALLEGRA) 180 MG tablet Take 180 mg by mouth daily.    . meclizine (ANTIVERT) 25 MG tablet Take 1 tablet (25 mg total) by mouth 3 (three) times daily as needed for dizziness.  . mometasone (ELOCON) 0.1 % cream Apply 1 application topically daily. To affected area  . NASONEX 50 MCG/ACT nasal spray PLACE 2 SPRAYS IN THE NOSE DAILY  . ranitidine (ZANTAC) 150 MG tablet Take 1 tablet (150 mg total) by mouth 2 (two) times daily.   No current facility-administered medications on file prior to visit.    Review of Systems Per HPI unless specifically indicated above       Objective:    BP 136/88 mmHg  Pulse 76  Temp(Src) 98.3 F (36.8 C) (Oral)  Wt 134 lb 8 oz (61.009 kg)  SpO2 99%  Wt Readings from Last 3 Encounters:  02/24/14 134 lb 8 oz (61.009 kg)  12/06/13 134 lb 12 oz (61.122 kg)  08/30/13 133 lb 4 oz (60.442 kg)    Physical Exam  Constitutional: She appears well-developed and well-nourished. No distress.  congested  HENT:  Head: Normocephalic and atraumatic.  Right Ear: Hearing, tympanic membrane, external ear and ear canal normal.  Left Ear: Hearing, tympanic membrane, external ear and ear canal normal.  Nose: No mucosal edema or rhinorrhea. Right sinus exhibits no maxillary sinus tenderness and no frontal sinus tenderness. Left sinus exhibits no maxillary sinus tenderness and no frontal sinus tenderness.  Mouth/Throat: Uvula is midline, oropharynx is clear and moist and mucous membranes are normal. No oropharyngeal exudate, posterior oropharyngeal edema, posterior oropharyngeal erythema or tonsillar abscesses.  Pale nasal mucosa  Eyes: Conjunctivae and EOM are normal. Pupils are equal, round, and reactive to light. No scleral icterus.  Neck: Normal range of motion. Neck supple.  Cardiovascular: Normal rate, regular rhythm, normal heart sounds and intact distal pulses.   No murmur heard. Pulmonary/Chest: Effort normal and breath sounds normal. No respiratory distress. She has no wheezes. She has no rales.  Lymphadenopathy:  She has no cervical adenopathy.  Skin: Skin is warm and dry. No rash noted.  Nursing note and vitals reviewed.      Assessment & Plan:   Problem List Items Addressed This Visit    Viral URI with cough - Primary    Of short duration - anticipate viral and discussed with patient. Supportive care discussed, consider echinacea, zinc, vitamin C, push fluids and rest. Discussed NSAIDs and mucinex. Provided cheratussin per patient request. Red flags for concern for bacterial superinfection discussed.         Follow up plan: Return if symptoms worsen or fail to improve.

## 2014-02-24 NOTE — Patient Instructions (Signed)
Sounds like you have a viral upper respiratory infection. Antibiotics are not needed for this.  Viral infections usually take 7-10 days to resolve.  The cough can last a few weeks to go away. Push fluids and plenty of rest. Please return if you are not improving as expected, or if you have high fevers (>101.5) or worsening productive cough. Call clinic with questions.  Good to see you today.

## 2014-02-24 NOTE — Assessment & Plan Note (Signed)
Of short duration - anticipate viral and discussed with patient. Supportive care discussed, consider echinacea, zinc, vitamin C, push fluids and rest. Discussed NSAIDs and mucinex. Provided cheratussin per patient request. Red flags for concern for bacterial superinfection discussed.

## 2014-02-24 NOTE — Progress Notes (Signed)
Pre visit review using our clinic review tool, if applicable. No additional management support is needed unless otherwise documented below in the visit note. 

## 2014-03-03 ENCOUNTER — Other Ambulatory Visit (HOSPITAL_COMMUNITY)
Admission: RE | Admit: 2014-03-03 | Discharge: 2014-03-03 | Disposition: A | Discharge status: Home / Self Care | Source: Ambulatory Visit | Attending: Family Medicine | Admitting: Family Medicine

## 2014-03-03 ENCOUNTER — Encounter: Payer: Self-pay | Admitting: Family Medicine

## 2014-03-03 ENCOUNTER — Ambulatory Visit (INDEPENDENT_AMBULATORY_CARE_PROVIDER_SITE_OTHER): Admitting: Family Medicine

## 2014-03-03 VITALS — BP 132/86 | HR 74 | Temp 98.1°F | Ht 62.25 in | Wt 135.0 lb

## 2014-03-03 DIAGNOSIS — Z0000 Encounter for general adult medical examination without abnormal findings: Secondary | ICD-10-CM

## 2014-03-03 DIAGNOSIS — E7800 Pure hypercholesterolemia, unspecified: Secondary | ICD-10-CM

## 2014-03-03 DIAGNOSIS — E2839 Other primary ovarian failure: Secondary | ICD-10-CM | POA: Insufficient documentation

## 2014-03-03 DIAGNOSIS — Z01419 Encounter for gynecological examination (general) (routine) without abnormal findings: Secondary | ICD-10-CM | POA: Diagnosis present

## 2014-03-03 DIAGNOSIS — R739 Hyperglycemia, unspecified: Secondary | ICD-10-CM

## 2014-03-03 DIAGNOSIS — I10 Essential (primary) hypertension: Secondary | ICD-10-CM

## 2014-03-03 DIAGNOSIS — M810 Age-related osteoporosis without current pathological fracture: Secondary | ICD-10-CM

## 2014-03-03 DIAGNOSIS — Z1151 Encounter for screening for human papillomavirus (HPV): Secondary | ICD-10-CM | POA: Insufficient documentation

## 2014-03-03 MED ORDER — RANITIDINE HCL 150 MG PO TABS: 150.0000 mg | ORAL_TABLET | Freq: Two times a day (BID) | ORAL | Status: DC

## 2014-03-03 MED ORDER — ALBUTEROL SULFATE HFA 108 (90 BASE) MCG/ACT IN AERS: INHALATION_SPRAY | RESPIRATORY_TRACT | Status: DC

## 2014-03-03 MED ORDER — OLMESARTAN MEDOXOMIL-HCTZ 40-12.5 MG PO TABS: 1.0000 | ORAL_TABLET | Freq: Every day | ORAL | Status: DC

## 2014-03-03 MED ORDER — MOMETASONE FUROATE 50 MCG/ACT NA SUSP: NASAL | Status: DC

## 2014-03-03 NOTE — Progress Notes (Signed)
Pre visit review using our clinic review tool, if applicable. No additional management support is needed unless otherwise documented below in the visit note. 

## 2014-03-03 NOTE — Assessment & Plan Note (Signed)
Routine exam and pap done today  No complaints

## 2014-03-03 NOTE — Assessment & Plan Note (Signed)
Lab Results  Component Value Date   HGBA1C 6.6* 02/24/2014   This is up from prev Disc risks of DM  Rev low glycemic diet and exercise  Re check in 3 mo and f/u

## 2014-03-03 NOTE — Assessment & Plan Note (Signed)
Ref for dexa 

## 2014-03-03 NOTE — Progress Notes (Signed)
Subjective:    Patient ID: Jasmine Rollins, female    DOB: [birth year], 64 y.o.   MRN: [id number]  HPI Here for health maintenance exam and to review chronic medical problems    Had a bad cold last week - is feeling better now  Saw Dr Reece Agar  Otherwise doing really well   Wt is stable with bmi of 24  HIV screen - does not want STD screening - not high risk  Flu vaccine had that in the fall  colonosc 1/07 0- normal    Mammogram 3/15 nl -[facility]  Self exam -no lumps or changes   Td 5/12 Zoster vaccine 6/12 Pneumovax was 2011 -will get next year   Pap 5/12 nl   dexa 8/13 - osteopenia  D level is low at 24  Hyperglycemia Lab Results  Component Value Date   HGBA1C 6.6* 02/24/2014   this is up from 6.4  Not eating differently - except for the holidays  Wants to get back on track with low sugar eating  In general she walks for exercise when not sick    bp is stable today  No cp or palpitations or headaches or edema  No side effects to medicines  BP Readings from Last 3 Encounters:  03/03/14 132/86  02/24/14 136/88  12/06/13 130/80       Chemistry      Component Value Date/Time   NA 143 02/26/2013 1021   K 4.4 02/26/2013 1021   CL 106 02/26/2013 1021   CO2 30 02/26/2013 1021   BUN 10 02/26/2013 1021   CREATININE 0.8 02/26/2013 1021      Component Value Date/Time   CALCIUM 9.7 02/26/2013 1021   ALKPHOS 95 08/23/2013 0825   AST 22 08/23/2013 0825   ALT 26 08/23/2013 0825   BILITOT 1.1 08/23/2013 0825      Lab Results  Component Value Date   TSH 3.52 02/24/2014    Lab Results  Component Value Date   WBC 3.5* 02/24/2014   HGB 14.4 02/24/2014   HCT 43.4 02/24/2014   MCV 93.6 02/24/2014   PLT 274.0 02/24/2014    Hyperlipidemia  Lab Results  Component Value Date   CHOL 206* 02/24/2014   CHOL 204* 02/26/2013   CHOL 213* 09/27/2011   Lab Results  Component Value Date   HDL 66.20 02/24/2014   HDL 67.90 02/26/2013   HDL 92.30 09/27/2011   Lab Results  Component Value Date   LDLCALC 119* 02/24/2014   LDLCALC 110* 06/17/2010   LDLCALC 97 01/26/2009   Lab Results  Component Value Date   TRIG 103.0 02/24/2014   TRIG 104.0 02/26/2013   TRIG 61.0 09/27/2011   Lab Results  Component Value Date   CHOLHDL 3 02/24/2014   CHOLHDL 3 02/26/2013   CHOLHDL 2 09/27/2011   Lab Results  Component Value Date   LDLDIRECT 106.4 02/26/2013   LDLDIRECT 104.2 09/27/2011   LDLDIRECT 86.7 11/27/2007    LDL is up from 106 to 119 Wants to work on that    No gyn problems or issues  Nl pap 5/12  Has not had a hysterectomy   D level is 24  Takes 1000 iu daily  Due for 2 y dexa -was 2013    Patient Active Problem List   Diagnosis Date Noted  . Encounter for routine gynecological examination 03/03/2014  . Estrogen deficiency 03/03/2014  . Viral URI with cough 02/24/2014  .  Rash and nonspecific skin eruption 12/06/2013  . Heartburn 03/01/2013  . Elevated liver enzymes 03/01/2013  . Gynecological examination 06/22/2010  . Special screening for malignant neoplasms, colon 06/22/2010  . Glaucoma suspect 06/22/2010  . Routine general medical examination at a health care facility 06/03/2010  . Sleep apnea   . Osteoporosis 05/20/2009  . BACK PAIN, LUMBAR 02/02/2009  . SLEEP APNEA 10/17/2008  . CERVICAL LYMPHADENOPATHY 09/16/2008  . COUGH VARIANT ASTHMA 07/30/2008  . LEUKOCYTOPENIA UNSPECIFIED 01/29/2008  . Hyperglycemia 11/27/2007  . HYPERCHOLESTEROLEMIA 03/21/2007  . Essential hypertension 03/21/2007  . SINUSITIS, RECURRENT 03/21/2007  . ALLERGIC RHINITIS 03/21/2007  . DIVERTICULOSIS, COLON 03/21/2007   Past Medical History  Diagnosis Date  . Allergic rhinitis   . Diverticulosis of colon   . HTN (hypertension)   . Cough variant asthma   . Borderline high cholesterol   . Hyperglycemia     borderline DM  . GERD (gastroesophageal reflux disease)   . OP (osteoporosis)   . Sleep apnea    Past  Surgical History  Procedure Laterality Date  . Tubal ligation    . Colposcopy    . Rotator cuff repair      right  . Esophagogastroduodenoscopy  10/01    Negative   History  Substance Use Topics  . Smoking status: Never Smoker   . Smokeless tobacco: Never Used     Comment: Remote 2nd hand exposure  . Alcohol Use: No   Family History  Problem Relation Age of Onset  . Hypertension Father   . Diabetes Father   . Diabetes Brother   . Diabetes Brother   . Cancer Brother     prostate  . Cancer Brother     prostate  . Cancer Brother     prostate  . Cancer Brother     prostate  . Cancer Brother     prostate  . Cancer Brother     prostate   Allergies  Allergen Reactions  . Sulfonamide Derivatives    Current Outpatient Prescriptions on File Prior to Visit  Medication Sig Dispense Refill  . calcium carbonate (OS-CAL) 600 MG TABS Take 600 mg by mouth 2 (two) times daily with a meal.      . Cholecalciferol (VITAMIN D) 400 UNITS capsule Take 400 Units by mouth daily.      . fexofenadine (ALLEGRA) 180 MG tablet Take 180 mg by mouth daily.      . mometasone (ELOCON) 0.1 % cream Apply 1 application topically daily. To affected area 15 g 0   No current facility-administered medications on file prior to visit.    Review of Systems Review of Systems  Constitutional: Negative for fever, appetite change, fatigue and unexpected weight change.  Eyes: Negative for pain and visual disturbance.  Respiratory: Negative for cough and shortness of breath.   Cardiovascular: Negative for cp or palpitations    Gastrointestinal: Negative for nausea, diarrhea and constipation.  Genitourinary: Negative for urgency and frequency.  Skin: Negative for pallor or rash   Neurological: Negative for weakness, light-headedness, numbness and headaches.  Hematological: Negative for adenopathy. Does not bruise/bleed easily.  Psychiatric/Behavioral: Negative for dysphoric mood. The patient is not  nervous/anxious.         Objective:   Physical Exam  Constitutional: She appears well-developed and well-nourished. No distress.  HENT:  Head: Normocephalic and atraumatic.  Right Ear: External ear normal.  Left Ear: External ear normal.  Mouth/Throat: Oropharynx is clear and moist.  Eyes: Conjunctivae and EOM  are normal. Pupils are equal, round, and reactive to light. No scleral icterus.  Neck: Normal range of motion. Neck supple. No JVD present. Carotid bruit is not present. No thyromegaly present.  Cardiovascular: Normal rate, regular rhythm, normal heart sounds and intact distal pulses.  Exam reveals no gallop.   Pulmonary/Chest: Effort normal and breath sounds normal. No respiratory distress. She has no wheezes. She exhibits no tenderness.  Abdominal: Soft. Bowel sounds are normal. She exhibits no distension, no abdominal bruit and no mass. There is no tenderness.  Genitourinary: Vagina normal and uterus normal. No breast swelling, tenderness, discharge or bleeding. There is no rash, tenderness or lesion on the right labia. There is no rash, tenderness or lesion on the left labia. Uterus is not enlarged and not tender. Cervix exhibits no motion tenderness and no friability. Right adnexum displays no mass, no tenderness and no fullness. Left adnexum displays no mass, no tenderness and no fullness. No bleeding in the vagina. No vaginal discharge found.  Breast exam: No mass, nodules, thickening, tenderness, bulging, retraction, inflamation, nipple discharge or skin changes noted.  No axillary or clavicular LA.      Vaginal atrophy noted   Musculoskeletal: Normal range of motion. She exhibits no edema or tenderness.  Lymphadenopathy:    She has no cervical adenopathy.  Neurological: She is alert. She has normal reflexes. No cranial nerve deficit. She exhibits normal muscle tone. Coordination normal.  Skin: Skin is warm and dry. No rash noted. No erythema. No pallor.  Psychiatric: She has  a normal mood and affect.          Assessment & Plan:   Problem List Items Addressed This Visit      Cardiovascular and Mediastinum   Essential hypertension    bp in fair control at this time  BP Readings from Last 1 Encounters:  03/03/14 132/86   No changes needed Disc lifstyle change with low sodium diet and exercise  Labs reviewed       Relevant Medications   olmesartan-hydrochlorothiazide (BENICAR HCT) 40-12.5 MG per tablet     Musculoskeletal and Integument   Osteoporosis    Disc need for calcium/ vitamin D/ wt bearing exercise and bone density test every 2 y to monitor Disc safety/ fracture risk in detail   Will schedule dexa  Add 2000 iu vit D daily  Disc safety/fall prev         Other   Encounter for routine gynecological examination    Routine exam and pap done today  No complaints       Relevant Orders   Cytology - PAP   Estrogen deficiency    Ref for dexa       Relevant Orders   DG Bone Density   HYPERCHOLESTEROLEMIA    Lipids are up  Disc goals for lipids and reasons to control them Rev labs with pt Rev low sat fat diet in detail Re check 3 mo with improved diet       Relevant Medications   olmesartan-hydrochlorothiazide (BENICAR HCT) 40-12.5 MG per tablet   Hyperglycemia    Lab Results  Component Value Date   HGBA1C 6.6* 02/24/2014   This is up from prev Disc risks of DM  Rev low glycemic diet and exercise  Re check in 3 mo and f/u      Routine general medical examination at a health care facility - Primary    Reviewed health habits including diet and exercise and skin cancer prevention Reviewed  appropriate screening tests for age  Also reviewed health mt list, fam hx and immunization status , as well as social and family history   See HPI Labs reviewed  Pt will schedule her mammogram in March

## 2014-03-03 NOTE — Assessment & Plan Note (Signed)
Reviewed health habits including diet and exercise and skin cancer prevention Reviewed appropriate screening tests for age  Also reviewed health mt list, fam hx and immunization status , as well as social and family history   See HPI Labs reviewed  Pt will schedule her mammogram in March

## 2014-03-03 NOTE — Assessment & Plan Note (Signed)
Lipids are up  Disc goals for lipids and reasons to control them Rev labs with pt Rev low sat fat diet in detail Re check 3 mo with improved diet

## 2014-03-03 NOTE — Assessment & Plan Note (Signed)
Disc need for calcium/ vitamin D/ wt bearing exercise and bone density test every 2 y to monitor Disc safety/ fracture risk in detail   Will schedule dexa  Add 2000 iu vit D daily  Disc safety/fall prev

## 2014-03-03 NOTE — Patient Instructions (Signed)
Don't forget to schedule your mammogram in March  Stop at check out to schedule your bone density test Cholesterol and blood sugar are up a bit  Work on healthy diet and exercise  Schedule follow up in 3 months with labs prior  Take a calcium plus D supplement twice daily  Increase your vitamin D to 2000 iu daily   Take care of yourself

## 2014-03-03 NOTE — Assessment & Plan Note (Signed)
bp in fair control at this time  BP Readings from Last 1 Encounters:  03/03/14 132/86   No changes needed Disc lifstyle change with low sodium diet and exercise  Labs reviewed

## 2014-03-05 ENCOUNTER — Telehealth: Payer: Self-pay

## 2014-03-05 LAB — CYTOLOGY - PAP

## 2014-03-05 MED ORDER — FLUTICASONE PROPIONATE 50 MCG/ACT NA SUSP: 2.0000 | Freq: Every day | NASAL | Status: DC

## 2014-03-05 NOTE — Telephone Encounter (Signed)
Pt received notice from [facility] that nasonex is not covered and pt request substitute fluticasone. Pt request cb.

## 2014-03-05 NOTE — Telephone Encounter (Signed)
I [facility] it to [facility]

## 2014-03-20 ENCOUNTER — Ambulatory Visit (INDEPENDENT_AMBULATORY_CARE_PROVIDER_SITE_OTHER)
Admission: RE | Admit: 2014-03-20 | Discharge: 2014-03-20 | Disposition: A | Discharge status: Home / Self Care | Source: Ambulatory Visit | Attending: Family Medicine | Admitting: Family Medicine

## 2014-03-20 DIAGNOSIS — E2839 Other primary ovarian failure: Secondary | ICD-10-CM

## 2014-04-10 ENCOUNTER — Telehealth: Payer: Self-pay

## 2014-04-10 NOTE — Telephone Encounter (Signed)
Pt left v/m requesting pap smear results from 03/03/14. Spoke with Mr Louanne SkyeDick pt not at home and advised Mr Louanne SkyeDick results were in [facility] and the pap smear results were normal. Mr Louanne SkyeDick voiced understanding and will let pt know.

## 2014-04-28 ENCOUNTER — Other Ambulatory Visit: Payer: Self-pay

## 2014-04-28 DIAGNOSIS — Z1231 Encounter for screening mammogram for malignant neoplasm of breast: Secondary | ICD-10-CM

## 2014-05-03 ENCOUNTER — Encounter: Payer: Self-pay | Admitting: Nurse Practitioner

## 2014-05-03 ENCOUNTER — Ambulatory Visit (INDEPENDENT_AMBULATORY_CARE_PROVIDER_SITE_OTHER): Admitting: Nurse Practitioner

## 2014-05-03 VITALS — BP 140/92 | Temp 98.3°F | Wt 136.0 lb

## 2014-05-03 DIAGNOSIS — J329 Chronic sinusitis, unspecified: Secondary | ICD-10-CM

## 2014-05-03 MED ORDER — AMOXICILLIN-POT CLAVULANATE 875-125 MG PO TABS: 1.0000 | ORAL_TABLET | Freq: Two times a day (BID) | ORAL | Status: DC

## 2014-05-03 NOTE — Progress Notes (Signed)
Subjective:    Patient ID: Jasmine Rollins, female    DOB: Jul 02, 1950, 64 y.o.   MRN: [id number]  HPI  Jasmine Rollins is a 64 yo female with a CC of sinusitis x 7 days.  1) Jasmine Rollins reports starting with a  sore throat, cough, congestion, dizzy for a short period of time last night with headache, and fatigued. Yellow nasal drainage and coughing up yellow phlegm. She also reports frontal pressure/headache that is equal bilaterally.  Treatment to date:  Theraflu- helpful Afrin x 3 nights- helpful for short period of time  Allegra and flonase- somewhat helpful   No antibiotic treatment in last 3 months and does not have ENT doctor currently despite chronic nature of sinusitis.   Review of Systems  Constitutional: Negative for fever, chills, diaphoresis and fatigue.  HENT: Positive for congestion, postnasal drip, rhinorrhea, sinus pressure and sore throat. Negative for ear discharge, ear pain, facial swelling and sneezing.   Eyes: Negative for visual disturbance.  Respiratory: Positive for cough. Negative for chest tightness, shortness of breath and wheezing.   Cardiovascular: Negative for chest pain, palpitations and leg swelling.  Gastrointestinal: Negative for nausea, vomiting and diarrhea.  Skin: Negative for rash.  Neurological: Negative for dizziness, weakness, numbness and headaches.   Past Medical History  Diagnosis Date  . Allergic rhinitis   . Diverticulosis of colon   . HTN (hypertension)   . Cough variant asthma   . Borderline high cholesterol   . Hyperglycemia     borderline DM  . GERD (gastroesophageal reflux disease)   . OP (osteoporosis)   . Sleep apnea     History   Social History  . Marital Status: Married    Spouse Name: N/A  . Number of Children: 1  . Years of Education: N/A   Occupational History  . Customer Service    Social History Main Topics  . Smoking status: Never Smoker   . Smokeless tobacco: Never Used     Comment: Remote 2nd hand exposure  .  Alcohol Use: No  . Drug Use: No  . Sexual Activity: Not on file   Other Topics Concern  . Not on file   Social History Narrative    Past Surgical History  Procedure Laterality Date  . Tubal ligation    . Colposcopy    . Rotator cuff repair      right  . Esophagogastroduodenoscopy  10/01    Negative    Family History  Problem Relation Age of Onset  . Hypertension Father   . Diabetes Father   . Diabetes Brother   . Diabetes Brother   . Cancer Brother     prostate  . Cancer Brother     prostate  . Cancer Brother     prostate  . Cancer Brother     prostate  . Cancer Brother     prostate  . Cancer Brother     prostate    Allergies  Allergen Reactions  . Sulfonamide Derivatives     Current Outpatient Prescriptions on File Prior to Visit  Medication Sig Dispense Refill  . albuterol (PROVENTIL HFA) 108 (90 BASE) MCG/ACT inhaler INHALE 2 PUFFS EVERY 4 HOURS AS NEEDED 3 Inhaler 3  . calcium carbonate (OS-CAL) 600 MG TABS Take 600 mg by mouth 2 (two) times daily with a meal.      . Cholecalciferol (VITAMIN D) 400 UNITS capsule Take 400 Units by mouth daily.      .Marland Kitchen  fexofenadine (ALLEGRA) 180 MG tablet Take 180 mg by mouth daily.      . fluticasone (FLONASE) 50 MCG/ACT nasal spray Place 2 sprays into both nostrils daily. 48 g 3  . mometasone (ELOCON) 0.1 % cream Apply 1 application topically daily. To affected area 15 g 0  . olmesartan-hydrochlorothiazide (BENICAR HCT) 40-12.5 MG per tablet Take 1 tablet by mouth daily. 90 tablet 3  . ranitidine (ZANTAC) 150 MG tablet Take 1 tablet (150 mg total) by mouth 2 (two) times daily. 180 tablet 3   No current facility-administered medications on file prior to visit.      Objective:   Physical Exam  Constitutional: She is oriented to person, place, and time. She appears well-developed and well-nourished. No distress.  BP 140/92 mmHg  Temp(Src) 98.3 F (36.8 C) (Oral)  Wt 136 lb (61.689 kg)   HENT:  Head: Normocephalic  and atraumatic.  Right Ear: External ear normal.  Left Ear: External ear normal.  Mouth/Throat: No oropharyngeal exudate.  TM's clear bilaterally Frontal pain with palpation Negative for maxillary pain with palpation  Eyes: Right eye exhibits no discharge. Left eye exhibits no discharge. No scleral icterus.  Neck: Normal range of motion. Neck supple. No thyromegaly present.  Cardiovascular: Normal rate, regular rhythm and normal heart sounds.  Exam reveals no gallop and no friction rub.   No murmur heard. Pulmonary/Chest: Effort normal and breath sounds normal. No respiratory distress. She has no wheezes. She has no rales. She exhibits no tenderness.  Lymphadenopathy:    She has no cervical adenopathy.  Neurological: She is alert and oriented to person, place, and time.  Skin: Skin is warm and dry. No rash noted. She is not diaphoretic.      Assessment & Plan:

## 2014-05-03 NOTE — Patient Instructions (Signed)
Finish antibiotics as prescribed.   Please take a probiotic ( Align, Floraque or Culturelle) while you are on the antibiotic to prevent a serious antibiotic associated diarrhea  Called clostirudium dificile colitis and a vaginal yeast infection.

## 2014-05-03 NOTE — Progress Notes (Signed)
Pre visit review using our clinic review tool, if applicable. No additional management support is needed unless otherwise documented below in the visit note. 

## 2014-05-04 NOTE — Assessment & Plan Note (Signed)
7 days of worsening symptoms despite conservative therapy. Augmentin 875-125 mg twice daily for 7 days. Asked pt to take a probiotic daily during and a few days after treatment to help prevent yeast infection and c. Diff colitis. FU with PCP prn worsening/failure to improve.

## 2014-05-07 ENCOUNTER — Ambulatory Visit

## 2014-05-08 ENCOUNTER — Ambulatory Visit
Admission: RE | Admit: 2014-05-08 | Discharge: 2014-05-08 | Disposition: A | Discharge status: Home / Self Care | Source: Ambulatory Visit

## 2014-05-08 DIAGNOSIS — Z1231 Encounter for screening mammogram for malignant neoplasm of breast: Secondary | ICD-10-CM

## 2014-06-03 ENCOUNTER — Telehealth: Payer: Self-pay | Admitting: Family Medicine

## 2014-06-03 DIAGNOSIS — E7800 Pure hypercholesterolemia, unspecified: Secondary | ICD-10-CM

## 2014-06-03 DIAGNOSIS — R739 Hyperglycemia, unspecified: Secondary | ICD-10-CM

## 2014-06-03 NOTE — Telephone Encounter (Signed)
-----   Message from Alvina Chouerri J Walsh sent at 06/02/2014 11:30 AM EDT ----- Regarding: Lab orders for Wednesday, 4.20.16 Lab orders for 3 month f/u

## 2014-06-04 ENCOUNTER — Other Ambulatory Visit (INDEPENDENT_AMBULATORY_CARE_PROVIDER_SITE_OTHER)

## 2014-06-04 DIAGNOSIS — E7800 Pure hypercholesterolemia, unspecified: Secondary | ICD-10-CM

## 2014-06-04 DIAGNOSIS — R739 Hyperglycemia, unspecified: Secondary | ICD-10-CM

## 2014-06-04 LAB — LIPID PANEL
CHOLESTEROL: 210 mg/dL — AB (ref 0–200)
HDL: 77.1 mg/dL (ref >39.00)
LDL CALC: 117 mg/dL — AB (ref 0–99)
NonHDL: 132.9
Total CHOL/HDL Ratio: 3
Triglycerides: 79 mg/dL (ref 0.0–149.0)
VLDL: 15.8 mg/dL (ref 0.0–40.0)

## 2014-06-04 LAB — HEMOGLOBIN A1C: Hgb A1c MFr Bld: 6.2 % (ref 4.6–6.5)

## 2014-06-09 ENCOUNTER — Ambulatory Visit (INDEPENDENT_AMBULATORY_CARE_PROVIDER_SITE_OTHER): Admitting: Family Medicine

## 2014-06-09 ENCOUNTER — Encounter: Payer: Self-pay | Admitting: Family Medicine

## 2014-06-09 VITALS — BP 138/84 | HR 72 | Temp 97.7°F | Ht 62.25 in | Wt 133.0 lb

## 2014-06-09 DIAGNOSIS — K219 Gastro-esophageal reflux disease without esophagitis: Secondary | ICD-10-CM

## 2014-06-09 DIAGNOSIS — R739 Hyperglycemia, unspecified: Secondary | ICD-10-CM | POA: Diagnosis not present

## 2014-06-09 DIAGNOSIS — I10 Essential (primary) hypertension: Secondary | ICD-10-CM

## 2014-06-09 DIAGNOSIS — E7800 Pure hypercholesterolemia, unspecified: Secondary | ICD-10-CM

## 2014-06-09 MED ORDER — OMEPRAZOLE 20 MG PO CPDR: 20.0000 mg | DELAYED_RELEASE_CAPSULE | Freq: Every day | ORAL | Status: DC

## 2014-06-09 NOTE — Progress Notes (Signed)
Pre visit review using our clinic review tool, if applicable. No additional management support is needed unless otherwise documented below in the visit note. 

## 2014-06-09 NOTE — Assessment & Plan Note (Signed)
Improved Lab Results  Component Value Date   HGBA1C 6.2 06/04/2014   Commended on better diet /exercise  F/u 6 mo

## 2014-06-09 NOTE — Assessment & Plan Note (Signed)
Ranitidine no longer works [employer] diet /habits Handout given  Change tx to PPI- omeprazole 20 mg daily Update 2 wk

## 2014-06-09 NOTE — Assessment & Plan Note (Signed)
Disc goals for lipids and reasons to control them Rev labs with pt Rev low sat fat diet in detail This is improved slt - HDL up 10 pt  Enc to keep up walking

## 2014-06-09 NOTE — Assessment & Plan Note (Signed)
bp in fair control at this time  BP Readings from Last 1 Encounters:  06/09/14 138/84   No changes needed Disc lifstyle change with low sodium diet and exercise  Lab rev

## 2014-06-09 NOTE — Progress Notes (Signed)
Subjective:    Patient ID: Jasmine RoyaltyMattie E Pinkerton, female    DOB: 19-Aug-1950, 64 y.o.   MRN: [id number]  HPI Here for f/u of chronic medical problems   Wt is down 3 lb with bmi of 24 She is doing well / with diet and exercise  Walks at least 3 times per week   bp is stable today  No cp or palpitations or headaches or edema  No side effects to medicines  BP Readings from Last 3 Encounters:  06/09/14 138/84  05/03/14 140/92  03/03/14 132/86     Hyperlipidemia Lab Results  Component Value Date   CHOL 210* 06/04/2014   CHOL 206* 02/24/2014   CHOL 204* 02/26/2013   Lab Results  Component Value Date   HDL 77.10 06/04/2014   HDL 66.20 02/24/2014   HDL 67.90 02/26/2013   Lab Results  Component Value Date   LDLCALC 117* 06/04/2014   LDLCALC 119* 02/24/2014   LDLCALC 110* 06/17/2010   Lab Results  Component Value Date   TRIG 79.0 06/04/2014   TRIG 103.0 02/24/2014   TRIG 104.0 02/26/2013   Lab Results  Component Value Date   CHOLHDL 3 06/04/2014   CHOLHDL 3 02/24/2014   CHOLHDL 3 02/26/2013   Lab Results  Component Value Date   LDLDIRECT 106.4 02/26/2013   LDLDIRECT 104.2 09/27/2011   LDLDIRECT 86.7 11/27/2007   good ratio overall   Hyperglycemia Lab Results  Component Value Date   HGBA1C 6.2 06/04/2014   This is down from 6.6  Pt is very pleased with this - she is working hard on limiting sugar in diet  Stopped all sugary drinks   Takes ranitidine 1 pill bid  Still gets indigestion/ heartburn  She occ takes advil  Has not been on PPI    Patient Active Problem List   Diagnosis Date Noted  . Encounter for routine gynecological examination 03/03/2014  . Estrogen deficiency 03/03/2014  . Viral URI with cough 02/24/2014  . Rash and nonspecific skin eruption 12/06/2013  . Heartburn 03/01/2013  . Elevated liver enzymes 03/01/2013  . Gynecological examination 06/22/2010  . Special screening for malignant neoplasms, colon 06/22/2010  . Glaucoma suspect  06/22/2010  . Routine general medical examination at a health care facility 06/03/2010  . Sleep apnea   . Osteoporosis 05/20/2009  . BACK PAIN, LUMBAR 02/02/2009  . SLEEP APNEA 10/17/2008  . CERVICAL LYMPHADENOPATHY 09/16/2008  . COUGH VARIANT ASTHMA 07/30/2008  . LEUKOCYTOPENIA UNSPECIFIED 01/29/2008  . Hyperglycemia 11/27/2007  . HYPERCHOLESTEROLEMIA 03/21/2007  . Essential hypertension 03/21/2007  . Sinusitis, chronic 03/21/2007  . ALLERGIC RHINITIS 03/21/2007  . DIVERTICULOSIS, COLON 03/21/2007   Past Medical History  Diagnosis Date  . Allergic rhinitis   . Diverticulosis of colon   . HTN (hypertension)   . Cough variant asthma   . Borderline high cholesterol   . Hyperglycemia     borderline DM  . GERD (gastroesophageal reflux disease)   . OP (osteoporosis)   . Sleep apnea    Past Surgical History  Procedure Laterality Date  . Tubal ligation    . Colposcopy    . Rotator cuff repair      right  . Esophagogastroduodenoscopy  10/01    Negative   History  Substance Use Topics  . Smoking status: Never Smoker   . Smokeless tobacco: Never Used     Comment: Remote 2nd hand exposure  . Alcohol Use: No   Family History  Problem Relation Age of Onset  .  Hypertension Father   . Diabetes Father   . Diabetes Brother   . Diabetes Brother   . Cancer Brother     prostate  . Cancer Brother     prostate  . Cancer Brother     prostate  . Cancer Brother     prostate  . Cancer Brother     prostate  . Cancer Brother     prostate   Allergies  Allergen Reactions  . Sulfonamide Derivatives    Current Outpatient Prescriptions on File Prior to Visit  Medication Sig Dispense Refill  . albuterol (PROVENTIL HFA) 108 (90 BASE) MCG/ACT inhaler INHALE 2 PUFFS EVERY 4 HOURS AS NEEDED 3 Inhaler 3  . amoxicillin-clavulanate (AUGMENTIN) 875-125 MG per tablet Take 1 tablet by mouth 2 (two) times daily. 14 tablet 0  . calcium carbonate (OS-CAL) 600 MG TABS Take 600 mg by mouth 2  (two) times daily with a meal.      . Cholecalciferol (VITAMIN D) 400 UNITS capsule Take 400 Units by mouth daily.      . fexofenadine (ALLEGRA) 180 MG tablet Take 180 mg by mouth daily.      . fluticasone (FLONASE) 50 MCG/ACT nasal spray Place 2 sprays into both nostrils daily. 48 g 3  . mometasone (ELOCON) 0.1 % cream Apply 1 application topically daily. To affected area 15 g 0  . olmesartan-hydrochlorothiazide (BENICAR HCT) 40-12.5 MG per tablet Take 1 tablet by mouth daily. 90 tablet 3  . ranitidine (ZANTAC) 150 MG tablet Take 1 tablet (150 mg total) by mouth 2 (two) times daily. 180 tablet 3   No current facility-administered medications on file prior to visit.    Review of Systems Review of Systems  Constitutional: Negative for fever, appetite change, fatigue and unexpected weight change.  Eyes: Negative for pain and visual disturbance.  Respiratory: Negative for cough and shortness of breath.   Cardiovascular: Negative for cp or palpitations    Gastrointestinal: Negative for nausea, diarrhea and constipation. pos for heartburn/indigestion  Genitourinary: Negative for urgency and frequency.  Skin: Negative for pallor or rash   Neurological: Negative for weakness, light-headedness, numbness and headaches.  Hematological: Negative for adenopathy. Does not bruise/bleed easily.  Psychiatric/Behavioral: Negative for dysphoric mood. The patient is not nervous/anxious.         Objective:   Physical Exam  Constitutional: She appears well-developed and well-nourished. No distress.  HENT:  Head: Normocephalic and atraumatic.  Mouth/Throat: Oropharynx is clear and moist.  Eyes: Conjunctivae and EOM are normal. Pupils are equal, round, and reactive to light.  Neck: Normal range of motion. Neck supple. No JVD present. Carotid bruit is not present. No thyromegaly present.  Cardiovascular: Normal rate, regular rhythm, normal heart sounds and intact distal pulses.  Exam reveals no gallop.     Pulmonary/Chest: Effort normal and breath sounds normal. No respiratory distress. She has no wheezes. She has no rales.  No crackles  Abdominal: Soft. Bowel sounds are normal. She exhibits no distension, no abdominal bruit and no mass. There is no tenderness. There is no rebound and no guarding.  Musculoskeletal: She exhibits no edema.  Lymphadenopathy:    She has no cervical adenopathy.  Neurological: She is alert. She has normal reflexes.  Skin: Skin is warm and dry. No rash noted.  Psychiatric: She has a normal mood and affect.          Assessment & Plan:   Problem List Items Addressed This Visit      Cardiovascular  and Mediastinum   Essential hypertension - Primary    bp in fair control at this time  BP Readings from Last 1 Encounters:  06/09/14 138/84   No changes needed Disc lifstyle change with low sodium diet and exercise  Lab rev        Digestive   GERD (gastroesophageal reflux disease)    Ranitidine no longer works [employer] /habits Handout given  Change tx to PPI- omeprazole 20 mg daily Update 2 wk        Relevant Medications   omeprazole (PRILOSEC) 20 MG capsule     Other   HYPERCHOLESTEROLEMIA    Disc goals for lipids and reasons to control them Rev labs with pt Rev low sat fat diet in detail This is improved slt - HDL up 10 pt  Enc to keep up walking       Hyperglycemia    Improved Lab Results  Component Value Date   HGBA1C 6.2 06/04/2014   Commended on better diet /exercise  F/u 6 mo

## 2014-06-09 NOTE — Patient Instructions (Signed)
Replace ranitidine with omeprazole 20 mg once daily in am  If symptoms are not better in 2 weeks let me know  Labs are improved  Follow up in 6 months with labs prior

## 2014-09-18 DIAGNOSIS — H4011X1 Primary open-angle glaucoma, mild stage: Secondary | ICD-10-CM | POA: Diagnosis not present

## 2014-09-18 DIAGNOSIS — Z8669 Personal history of other diseases of the nervous system and sense organs: Secondary | ICD-10-CM | POA: Diagnosis not present

## 2014-09-18 DIAGNOSIS — H524 Presbyopia: Secondary | ICD-10-CM | POA: Diagnosis not present

## 2014-09-18 DIAGNOSIS — H409 Unspecified glaucoma: Secondary | ICD-10-CM | POA: Diagnosis not present

## 2014-10-15 DIAGNOSIS — H4011X1 Primary open-angle glaucoma, mild stage: Secondary | ICD-10-CM | POA: Diagnosis not present

## 2014-10-15 DIAGNOSIS — H40033 Anatomical narrow angle, bilateral: Secondary | ICD-10-CM | POA: Diagnosis not present

## 2014-12-02 ENCOUNTER — Telehealth: Payer: Self-pay | Admitting: Family Medicine

## 2014-12-02 DIAGNOSIS — R739 Hyperglycemia, unspecified: Secondary | ICD-10-CM

## 2014-12-02 DIAGNOSIS — I10 Essential (primary) hypertension: Secondary | ICD-10-CM

## 2014-12-02 DIAGNOSIS — E7800 Pure hypercholesterolemia, unspecified: Secondary | ICD-10-CM

## 2014-12-02 NOTE — Telephone Encounter (Signed)
-----   Message from Alvina Chouerri J Walsh sent at 11/27/2014  4:08 PM EDT ----- Regarding: lab orders for Wednesday, 10.19.16 Lab orders for a 6 month follow up appt

## 2014-12-03 ENCOUNTER — Other Ambulatory Visit (INDEPENDENT_AMBULATORY_CARE_PROVIDER_SITE_OTHER): Payer: Medicare Other

## 2014-12-03 DIAGNOSIS — E7800 Pure hypercholesterolemia, unspecified: Secondary | ICD-10-CM | POA: Diagnosis not present

## 2014-12-03 DIAGNOSIS — R739 Hyperglycemia, unspecified: Secondary | ICD-10-CM

## 2014-12-03 DIAGNOSIS — I10 Essential (primary) hypertension: Secondary | ICD-10-CM | POA: Diagnosis not present

## 2014-12-03 LAB — COMPREHENSIVE METABOLIC PANEL
ALBUMIN: 4.3 g/dL (ref 3.5–5.2)
ALK PHOS: 105 U/L (ref 39–117)
ALT: 53 U/L — ABNORMAL HIGH (ref 0–35)
AST: 32 U/L (ref 0–37)
BILIRUBIN TOTAL: 1.2 mg/dL (ref 0.2–1.2)
BUN: 12 mg/dL (ref 6–23)
CALCIUM: 10.5 mg/dL (ref 8.4–10.5)
CHLORIDE: 104 meq/L (ref 96–112)
CO2: 32 mEq/L (ref 19–32)
Creatinine, Ser: 0.71 mg/dL (ref 0.40–1.20)
GFR: 106.13 mL/min (ref >60.00)
GLUCOSE: 121 mg/dL — AB (ref 70–99)
POTASSIUM: 4.6 meq/L (ref 3.5–5.1)
Sodium: 142 mEq/L (ref 135–145)
TOTAL PROTEIN: 7.7 g/dL (ref 6.0–8.3)

## 2014-12-03 LAB — LIPID PANEL
CHOL/HDL RATIO: 3
Cholesterol: 202 mg/dL — ABNORMAL HIGH (ref 0–200)
HDL: 79.9 mg/dL (ref >39.00)
LDL Cholesterol: 109 mg/dL — ABNORMAL HIGH (ref 0–99)
NONHDL: 122.1
TRIGLYCERIDES: 68 mg/dL (ref 0.0–149.0)
VLDL: 13.6 mg/dL (ref 0.0–40.0)

## 2014-12-03 LAB — HEMOGLOBIN A1C: Hgb A1c MFr Bld: 6.1 % (ref 4.6–6.5)

## 2014-12-04 ENCOUNTER — Other Ambulatory Visit

## 2014-12-09 ENCOUNTER — Encounter: Payer: Self-pay | Admitting: Family Medicine

## 2014-12-09 ENCOUNTER — Ambulatory Visit (INDEPENDENT_AMBULATORY_CARE_PROVIDER_SITE_OTHER): Payer: Medicare Other | Admitting: Family Medicine

## 2014-12-09 VITALS — BP 124/78 | HR 64 | Temp 97.8°F | Ht 62.25 in | Wt 134.0 lb

## 2014-12-09 DIAGNOSIS — I10 Essential (primary) hypertension: Secondary | ICD-10-CM | POA: Diagnosis not present

## 2014-12-09 DIAGNOSIS — E7800 Pure hypercholesterolemia, unspecified: Secondary | ICD-10-CM | POA: Diagnosis not present

## 2014-12-09 DIAGNOSIS — R739 Hyperglycemia, unspecified: Secondary | ICD-10-CM

## 2014-12-09 DIAGNOSIS — S29012A Strain of muscle and tendon of back wall of thorax, initial encounter: Secondary | ICD-10-CM | POA: Insufficient documentation

## 2014-12-09 NOTE — Patient Instructions (Signed)
Try to get a foam pillow the right height Do the Rhomboid muscle stretch I taught you  Also use heat  Add some yoga  Keep walking  Labs look good  Continue working on low fat and low sugar diet   Follow up in 6 months with labs prior

## 2014-12-09 NOTE — Progress Notes (Signed)
Subjective:    Patient ID: Jasmine Rollins, female    DOB: [birth year], 64 y.o.   MRN: [id number]  HPI Here for f/u of chronic medical problems   Also a muscle spasm in L upper back 3-4 wk Thinks her pillow is too tall  Hurts to move neck  No numbness/tingling  Not stretching    Dealing with seasonal allergies   Had her flu shot   Wt is stable with bmi of 24  bp is stable today  No cp or palpitations or headaches or edema  No side effects to medicines  BP Readings from Last 3 Encounters:  12/09/14 124/78  06/09/14 138/84  05/03/14 140/92     Hyperlipidemia Lab Results  Component Value Date   CHOL 202* 12/03/2014   CHOL 210* 06/04/2014   CHOL 206* 02/24/2014   Lab Results  Component Value Date   HDL 79.90 12/03/2014   HDL 77.10 06/04/2014   HDL 66.20 02/24/2014   Lab Results  Component Value Date   LDLCALC 109* 12/03/2014   LDLCALC 117* 06/04/2014   LDLCALC 119* 02/24/2014   Lab Results  Component Value Date   TRIG 68.0 12/03/2014   TRIG 79.0 06/04/2014   TRIG 103.0 02/24/2014   Lab Results  Component Value Date   CHOLHDL 3 12/03/2014   CHOLHDL 3 06/04/2014   CHOLHDL 3 02/24/2014   Lab Results  Component Value Date   LDLDIRECT 106.4 02/26/2013   LDLDIRECT 104.2 09/27/2011   LDLDIRECT 86.7 11/27/2007   diet controlled She is trying to eat a lot fat diet   Hyperglycemia Stable Lab Results  Component Value Date   HGBA1C 6.1 12/03/2014   Down from 6.2 Holding steady   Avoiding sugar and fat when she can   Walks for exercise 3 times per week    Patient Active Problem List   Diagnosis Date Noted  . Rhomboid muscle strain 12/09/2014  . GERD (gastroesophageal reflux disease) 06/09/2014  . Encounter for routine gynecological examination 03/03/2014  . Estrogen deficiency 03/03/2014  . Viral URI with cough 02/24/2014  . Rash and nonspecific skin eruption 12/06/2013  . Heartburn 03/01/2013  . Elevated liver enzymes 03/01/2013  .  Gynecological examination 06/22/2010  . Special screening for malignant neoplasms, colon 06/22/2010  . Glaucoma suspect 06/22/2010  . Routine general medical examination at a health care facility 06/03/2010  . Sleep apnea   . Osteoporosis 05/20/2009  . BACK PAIN, LUMBAR 02/02/2009  . SLEEP APNEA 10/17/2008  . CERVICAL LYMPHADENOPATHY 09/16/2008  . COUGH VARIANT ASTHMA 07/30/2008  . LEUKOCYTOPENIA UNSPECIFIED 01/29/2008  . Hyperglycemia 11/27/2007  . HYPERCHOLESTEROLEMIA 03/21/2007  . Essential hypertension 03/21/2007  . Sinusitis, chronic 03/21/2007  . ALLERGIC RHINITIS 03/21/2007  . DIVERTICULOSIS, COLON 03/21/2007   Past Medical History  Diagnosis Date  . Allergic rhinitis   . Diverticulosis of colon   . HTN (hypertension)   . Cough variant asthma   . Borderline high cholesterol   . Hyperglycemia     borderline DM  . GERD (gastroesophageal reflux disease)   . OP (osteoporosis)   . Sleep apnea    Past Surgical History  Procedure Laterality Date  . Tubal ligation    . Colposcopy    . Rotator cuff repair      right  . Esophagogastroduodenoscopy  10/01    Negative   Social History  Substance Use Topics  . Smoking status: Never Smoker   . Smokeless tobacco: Never Used     Comment: Remote  2nd hand exposure  . Alcohol Use: No   Family History  Problem Relation Age of Onset  . Hypertension Father   . Diabetes Father   . Diabetes Brother   . Diabetes Brother   . Cancer Brother     prostate  . Cancer Brother     prostate  . Cancer Brother     prostate  . Cancer Brother     prostate  . Cancer Brother     prostate  . Cancer Brother     prostate   Allergies  Allergen Reactions  . Sulfonamide Derivatives    Current Outpatient Prescriptions on File Prior to Visit  Medication Sig Dispense Refill  . albuterol (PROVENTIL HFA) 108 (90 BASE) MCG/ACT inhaler INHALE 2 PUFFS EVERY 4 HOURS AS NEEDED 3 Inhaler 3  . amoxicillin-clavulanate (AUGMENTIN) 875-125 MG per  tablet Take 1 tablet by mouth 2 (two) times daily. 14 tablet 0  . calcium carbonate (OS-CAL) 600 MG TABS Take 600 mg by mouth 2 (two) times daily with a meal.      . Cholecalciferol (VITAMIN D) 400 UNITS capsule Take 400 Units by mouth daily.      . fexofenadine (ALLEGRA) 180 MG tablet Take 180 mg by mouth daily.      . fluticasone (FLONASE) 50 MCG/ACT nasal spray Place 2 sprays into both nostrils daily. 48 g 3  . mometasone (ELOCON) 0.1 % cream Apply 1 application topically daily. To affected area 15 g 0  . olmesartan-hydrochlorothiazide (BENICAR HCT) 40-12.5 MG per tablet Take 1 tablet by mouth daily. 90 tablet 3   No current facility-administered medications on file prior to visit.     Review of Systems Review of Systems  Constitutional: Negative for fever, appetite change, fatigue and unexpected weight change.  Eyes: Negative for pain and visual disturbance.  Respiratory: Negative for cough and shortness of breath.   Cardiovascular: Negative for cp or palpitations    Gastrointestinal: Negative for nausea, diarrhea and constipation.  Genitourinary: Negative for urgency and frequency.  Skin: Negative for pallor or rash   MSK pos for L sided mid back pain/spasm Neurological: Negative for weakness, light-headedness, numbness and headaches.  Hematological: Negative for adenopathy. Does not bruise/bleed easily.  Psychiatric/Behavioral: Negative for dysphoric mood. The patient is not nervous/anxious.         Objective:   Physical Exam  Constitutional: She appears well-developed and well-nourished. No distress.  Well appearing   HENT:  Head: Normocephalic and atraumatic.  Mouth/Throat: Oropharynx is clear and moist.  Eyes: Conjunctivae and EOM are normal. Pupils are equal, round, and reactive to light.  Neck: Normal range of motion. Neck supple. No JVD present. Carotid bruit is not present. No thyromegaly present.  Cardiovascular: Normal rate, regular rhythm, normal heart sounds and  intact distal pulses.  Exam reveals no gallop.   Pulmonary/Chest: Effort normal and breath sounds normal. No respiratory distress. She has no wheezes. She has no rales.  No crackles  Abdominal: Soft. Bowel sounds are normal. She exhibits no distension, no abdominal bruit and no mass. There is no tenderness.  Musculoskeletal: She exhibits tenderness. She exhibits no edema.  Tender L peri lumbar tenderness in rhomboid area that resp to rhomboid stretch Nl rom neck with some dicomfort  No neuro signs   Lymphadenopathy:    She has no cervical adenopathy.  Neurological: She is alert. She has normal strength and normal reflexes. She displays no atrophy. No cranial nerve deficit or sensory deficit. She exhibits normal  muscle tone. Coordination normal.  Skin: Skin is warm and dry. No rash noted.  Psychiatric: She has a normal mood and affect.          Assessment & Plan:   Problem List Items Addressed This Visit      Cardiovascular and Mediastinum   Essential hypertension - Primary    bp in fair control at this time  BP Readings from Last 1 Encounters:  12/09/14 124/78   No changes needed Disc lifstyle change with low sodium diet and exercise  Labs reviewed         Musculoskeletal and Integument   Rhomboid muscle strain    Taught rhomboid stretch which seemed helpful  Disc way to lift and move  Recommend heat  Recommend foam cervical supp pillow of the correct ht  Update if no improvement         Other   HYPERCHOLESTEROLEMIA    Disc goals for lipids and reasons to control them Rev labs with pt Rev low sat fat diet in detail  Goal LDL of 100 or lower - getting closer      Hyperglycemia    Lab Results  Component Value Date   HGBA1C 6.1 12/03/2014   Stable and well controlled Disc low glycemic diet and will keep working on that

## 2014-12-10 NOTE — Assessment & Plan Note (Signed)
Taught rhomboid stretch which seemed helpful  Disc way to lift and move  Recommend heat  Recommend foam cervical supp pillow of the correct ht  Update if no improvement

## 2014-12-10 NOTE — Assessment & Plan Note (Signed)
Disc goals for lipids and reasons to control them Rev labs with pt Rev low sat fat diet in detail  Goal LDL of 100 or lower - getting closer

## 2014-12-10 NOTE — Assessment & Plan Note (Signed)
Lab Results  Component Value Date   HGBA1C 6.1 12/03/2014   Stable and well controlled Disc low glycemic diet and will keep working on that

## 2014-12-10 NOTE — Assessment & Plan Note (Signed)
bp in fair control at this time  BP Readings from Last 1 Encounters:  12/09/14 124/78   No changes needed Disc lifstyle change with low sodium diet and exercise  Labs reviewed

## 2015-02-09 ENCOUNTER — Other Ambulatory Visit: Payer: Self-pay | Admitting: Family Medicine

## 2015-03-28 ENCOUNTER — Other Ambulatory Visit: Payer: Self-pay | Admitting: Family Medicine

## 2015-04-09 ENCOUNTER — Other Ambulatory Visit: Payer: Self-pay | Admitting: Family Medicine

## 2015-04-10 ENCOUNTER — Encounter: Payer: Self-pay | Admitting: Gastroenterology

## 2015-04-15 DIAGNOSIS — H40033 Anatomical narrow angle, bilateral: Secondary | ICD-10-CM | POA: Diagnosis not present

## 2015-04-15 DIAGNOSIS — H401131 Primary open-angle glaucoma, bilateral, mild stage: Secondary | ICD-10-CM | POA: Diagnosis not present

## 2015-04-17 ENCOUNTER — Encounter: Payer: Self-pay | Admitting: Gastroenterology

## 2015-04-29 ENCOUNTER — Ambulatory Visit (INDEPENDENT_AMBULATORY_CARE_PROVIDER_SITE_OTHER): Payer: Medicare Other | Admitting: Family Medicine

## 2015-04-29 ENCOUNTER — Encounter: Payer: Self-pay | Admitting: Family Medicine

## 2015-04-29 VITALS — BP 128/84 | HR 88 | Temp 98.5°F | Ht 62.25 in | Wt 136.0 lb

## 2015-04-29 DIAGNOSIS — J069 Acute upper respiratory infection, unspecified: Secondary | ICD-10-CM | POA: Diagnosis not present

## 2015-04-29 DIAGNOSIS — R059 Cough, unspecified: Secondary | ICD-10-CM

## 2015-04-29 DIAGNOSIS — B9789 Other viral agents as the cause of diseases classified elsewhere: Principal | ICD-10-CM

## 2015-04-29 DIAGNOSIS — R05 Cough: Secondary | ICD-10-CM | POA: Diagnosis not present

## 2015-04-29 LAB — POC INFLUENZA A&B (BINAX/QUICKVUE)
INFLUENZA A, POC: NEGATIVE
INFLUENZA B, POC: NEGATIVE

## 2015-04-29 MED ORDER — GUAIFENESIN-CODEINE 100-10 MG/5ML PO SYRP: 5.0000 mL | ORAL_SOLUTION | Freq: Four times a day (QID) | ORAL | Status: DC | PRN

## 2015-04-29 MED ORDER — ALBUTEROL SULFATE HFA 108 (90 BASE) MCG/ACT IN AERS: INHALATION_SPRAY | RESPIRATORY_TRACT | Status: DC

## 2015-04-29 NOTE — Progress Notes (Signed)
Pre visit review using our clinic review tool, if applicable. No additional management support is needed unless otherwise documented below in the visit note. 

## 2015-04-29 NOTE — Progress Notes (Signed)
Subjective:    Patient ID: Jasmine RoyaltyMattie E Tokunaga, female    DOB: 1950/03/06, 65 y.o.   MRN: [id number]  HPI Here with cold symptoms   Several days ago - had a slight cough so she used her inhaler Now worse  Productive of clear mucous   Stuffy nose -occ sees blood in mucous  She does use flonase every night and allegra -for allergies   Tried chlorcedin this am for congestion   No fever  No body aches or chills  Was exp to the flu from her brother   She took a flu shot   Results for orders placed or performed in visit on 04/29/15  POC Influenza A&B(BINAX/QUICKVUE)  Result Value Ref Range   Influenza A, POC Negative Negative   Influenza B, POC Negative Negative     Patient Active Problem List   Diagnosis Date Noted  . Rhomboid muscle strain 12/09/2014  . GERD (gastroesophageal reflux disease) 06/09/2014  . Encounter for routine gynecological examination 03/03/2014  . Estrogen deficiency 03/03/2014  . Viral URI with cough 02/24/2014  . Rash and nonspecific skin eruption 12/06/2013  . Heartburn 03/01/2013  . Elevated liver enzymes 03/01/2013  . Gynecological examination 06/22/2010  . Special screening for malignant neoplasms, colon 06/22/2010  . Glaucoma suspect 06/22/2010  . Routine general medical examination at a health care facility 06/03/2010  . Sleep apnea   . Osteoporosis 05/20/2009  . BACK PAIN, LUMBAR 02/02/2009  . SLEEP APNEA 10/17/2008  . CERVICAL LYMPHADENOPATHY 09/16/2008  . COUGH VARIANT ASTHMA 07/30/2008  . LEUKOCYTOPENIA UNSPECIFIED 01/29/2008  . Hyperglycemia 11/27/2007  . HYPERCHOLESTEROLEMIA 03/21/2007  . Essential hypertension 03/21/2007  . Sinusitis, chronic 03/21/2007  . ALLERGIC RHINITIS 03/21/2007  . DIVERTICULOSIS, COLON 03/21/2007   Past Medical History  Diagnosis Date  . Allergic rhinitis   . Diverticulosis of colon   . HTN (hypertension)   . Cough variant asthma   . Borderline high cholesterol   . Hyperglycemia     borderline DM  .  GERD (gastroesophageal reflux disease)   . OP (osteoporosis)   . Sleep apnea    Past Surgical History  Procedure Laterality Date  . Tubal ligation    . Colposcopy    . Rotator cuff repair      right  . Esophagogastroduodenoscopy  10/01    Negative   Social History  Substance Use Topics  . Smoking status: Never Smoker   . Smokeless tobacco: Never Used     Comment: Remote 2nd hand exposure  . Alcohol Use: No   Family History  Problem Relation Age of Onset  . Hypertension Father   . Diabetes Father   . Diabetes Brother   . Diabetes Brother   . Cancer Brother     prostate  . Cancer Brother     prostate  . Cancer Brother     prostate  . Cancer Brother     prostate  . Cancer Brother     prostate  . Cancer Brother     prostate   Allergies  Allergen Reactions  . Sulfonamide Derivatives    Current Outpatient Prescriptions on File Prior to Visit  Medication Sig Dispense Refill  . albuterol (PROVENTIL HFA) 108 (90 BASE) MCG/ACT inhaler INHALE 2 PUFFS EVERY 4 HOURS AS NEEDED 3 Inhaler 3  . BENICAR HCT 40-12.5 MG tablet TAKE 1 TABLET DAILY 90 tablet 0  . calcium carbonate (OS-CAL) 600 MG TABS Take 600 mg by mouth 2 (two) times daily with a  meal.      . Cholecalciferol (VITAMIN D) 400 UNITS capsule Take 400 Units by mouth daily.      . fexofenadine (ALLEGRA) 180 MG tablet Take 180 mg by mouth daily.      . fluticasone (FLONASE) 50 MCG/ACT nasal spray USE 2 SPRAYS IN EACH NOSTRIL DAILY 48 g 1  . mometasone (ELOCON) 0.1 % cream Apply 1 application topically daily. To affected area 15 g 0  . ranitidine (ZANTAC) 150 MG capsule Take 150 mg by mouth 2 (two) times daily.     No current facility-administered medications on file prior to visit.     Review of Systems  Constitutional: Positive for appetite change and fatigue. Negative for fever.  HENT: Positive for congestion, postnasal drip, rhinorrhea, sinus pressure, sneezing and sore throat. Negative for ear pain.   Eyes:  Negative for pain and discharge.  Respiratory: Positive for cough. Negative for shortness of breath, wheezing and stridor.   Cardiovascular: Negative for chest pain.  Gastrointestinal: Negative for nausea, vomiting and diarrhea.  Genitourinary: Negative for urgency, frequency and hematuria.  Musculoskeletal: Negative for myalgias and arthralgias.  Skin: Negative for rash.  Neurological: Positive for headaches. Negative for dizziness, weakness and light-headedness.  Psychiatric/Behavioral: Negative for confusion and dysphoric mood.       Objective:   Physical Exam  Constitutional: She appears well-developed and well-nourished. No distress.  HENT:  Head: Normocephalic and atraumatic.  Right Ear: External ear normal.  Left Ear: External ear normal.  Mouth/Throat: Oropharynx is clear and moist.  Nares are injected and congested  No sinus tenderness Clear rhinorrhea and post nasal drip   Eyes: Conjunctivae and EOM are normal. Pupils are equal, round, and reactive to light. Right eye exhibits no discharge. Left eye exhibits no discharge.  Neck: Normal range of motion. Neck supple.  Cardiovascular: Normal rate and normal heart sounds.   Pulmonary/Chest: Effort normal and breath sounds normal. No respiratory distress. She has no wheezes. She has no rales. She exhibits no tenderness.  Lymphadenopathy:    She has no cervical adenopathy.  Neurological: She is alert.  Skin: Skin is warm and dry. No rash noted.  Psychiatric: She has a normal mood and affect.          Assessment & Plan:   Problem List Items Addressed This Visit      Respiratory   Viral URI with cough    Reassuring exam  I think you have a head and chest cold  Drink lots of fluids and rest  Try the netti pot to help clear your sinuses and prevent a sinus infection  Continue allergy medicines Breathe steam for congestion also  Use your inhaler if you need it for wheezing or cough  Robitussin AC for cough with  caution of sedation   Update if not starting to improve in a week or if worsening         Other Visit Diagnoses    Cough    -  Primary    Relevant Orders    POC Influenza A&B(BINAX/QUICKVUE) (Completed)

## 2015-04-29 NOTE — Patient Instructions (Addendum)
I think you have a head and chest cold  Drink lots of fluids and rest  Try the netti pot to help clear your sinuses and prevent a sinus infection  Continue allergy medicines Breathe steam for congestion also  Use your inhaler if you need it for wheezing or cough  Robitussin AC for cough with caution of sedation   Update if not starting to improve in a week or if worsening

## 2015-04-30 ENCOUNTER — Ambulatory Visit: Payer: TRICARE For Life (TFL) | Admitting: Primary Care

## 2015-04-30 NOTE — Assessment & Plan Note (Signed)
Reassuring exam  I think you have a head and chest cold  Drink lots of fluids and rest  Try the netti pot to help clear your sinuses and prevent a sinus infection  Continue allergy medicines Breathe steam for congestion also  Use your inhaler if you need it for wheezing or cough  Robitussin AC for cough with caution of sedation   Update if not starting to improve in a week or if worsening

## 2015-05-27 ENCOUNTER — Ambulatory Visit (AMBULATORY_SURGERY_CENTER): Payer: Self-pay | Admitting: *Deleted

## 2015-05-27 VITALS — Ht 62.0 in | Wt 133.0 lb

## 2015-05-27 DIAGNOSIS — Z1211 Encounter for screening for malignant neoplasm of colon: Secondary | ICD-10-CM

## 2015-05-27 MED ORDER — NA SULFATE-K SULFATE-MG SULF 17.5-3.13-1.6 GM/177ML PO SOLN: 1.0000 | Freq: Once | ORAL | Status: DC

## 2015-05-27 NOTE — Progress Notes (Signed)
No egg or soy allergy. No anesthesia problems.  No home O2.  No diet meds.  No emmi.  

## 2015-06-07 ENCOUNTER — Telehealth: Payer: Self-pay | Admitting: Family Medicine

## 2015-06-07 DIAGNOSIS — E7800 Pure hypercholesterolemia, unspecified: Secondary | ICD-10-CM

## 2015-06-07 DIAGNOSIS — R748 Abnormal levels of other serum enzymes: Secondary | ICD-10-CM

## 2015-06-07 DIAGNOSIS — R739 Hyperglycemia, unspecified: Secondary | ICD-10-CM

## 2015-06-07 DIAGNOSIS — I10 Essential (primary) hypertension: Secondary | ICD-10-CM

## 2015-06-07 NOTE — Telephone Encounter (Signed)
-----   Message from Baldomero LamyNatasha C Chavers sent at 06/02/2015 10:45 AM EDT ----- Regarding: 6 mo f/u labs Mon 4/24, need orders. Thanks! :-) Please order future 6 mo f/u labs for pt's upcoming lab appt. Thanks Rodney Boozeasha

## 2015-06-08 ENCOUNTER — Other Ambulatory Visit: Payer: Self-pay | Admitting: Family Medicine

## 2015-06-08 ENCOUNTER — Other Ambulatory Visit (INDEPENDENT_AMBULATORY_CARE_PROVIDER_SITE_OTHER): Payer: Medicare Other

## 2015-06-08 DIAGNOSIS — Z1159 Encounter for screening for other viral diseases: Secondary | ICD-10-CM | POA: Diagnosis not present

## 2015-06-08 DIAGNOSIS — R739 Hyperglycemia, unspecified: Secondary | ICD-10-CM | POA: Diagnosis not present

## 2015-06-08 DIAGNOSIS — Z7289 Other problems related to lifestyle: Secondary | ICD-10-CM

## 2015-06-08 DIAGNOSIS — E7800 Pure hypercholesterolemia, unspecified: Secondary | ICD-10-CM

## 2015-06-08 DIAGNOSIS — R748 Abnormal levels of other serum enzymes: Secondary | ICD-10-CM | POA: Diagnosis not present

## 2015-06-08 DIAGNOSIS — I10 Essential (primary) hypertension: Secondary | ICD-10-CM | POA: Diagnosis not present

## 2015-06-08 LAB — COMPREHENSIVE METABOLIC PANEL
ALBUMIN: 4.3 g/dL (ref 3.5–5.2)
ALK PHOS: 88 U/L (ref 39–117)
ALT: 19 U/L (ref 0–35)
AST: 20 U/L (ref 0–37)
BILIRUBIN TOTAL: 0.8 mg/dL (ref 0.2–1.2)
BUN: 10 mg/dL (ref 6–23)
CALCIUM: 10 mg/dL (ref 8.4–10.5)
CO2: 33 mEq/L — ABNORMAL HIGH (ref 19–32)
Chloride: 103 mEq/L (ref 96–112)
Creatinine, Ser: 0.71 mg/dL (ref 0.40–1.20)
GFR: 105.97 mL/min (ref >60.00)
GLUCOSE: 115 mg/dL — AB (ref 70–99)
Potassium: 3.9 mEq/L (ref 3.5–5.1)
Sodium: 141 mEq/L (ref 135–145)
TOTAL PROTEIN: 7.5 g/dL (ref 6.0–8.3)

## 2015-06-08 LAB — LIPID PANEL
CHOL/HDL RATIO: 3
Cholesterol: 212 mg/dL — ABNORMAL HIGH (ref 0–200)
HDL: 80.3 mg/dL (ref >39.00)
LDL Cholesterol: 112 mg/dL — ABNORMAL HIGH (ref 0–99)
NONHDL: 131.38
TRIGLYCERIDES: 96 mg/dL (ref 0.0–149.0)
VLDL: 19.2 mg/dL (ref 0.0–40.0)

## 2015-06-08 LAB — HEMOGLOBIN A1C: HEMOGLOBIN A1C: 6.4 % (ref 4.6–6.5)

## 2015-06-09 LAB — HEPATITIS C ANTIBODY: HCV Ab: NEGATIVE

## 2015-06-11 ENCOUNTER — Encounter: Payer: TRICARE For Life (TFL) | Admitting: Gastroenterology

## 2015-06-11 ENCOUNTER — Encounter: Payer: Self-pay | Admitting: Gastroenterology

## 2015-06-11 ENCOUNTER — Ambulatory Visit (AMBULATORY_SURGERY_CENTER): Payer: Medicare Other | Admitting: Gastroenterology

## 2015-06-11 VITALS — BP 137/81 | HR 70 | Temp 98.0°F | Resp 15 | Ht 62.0 in | Wt 133.0 lb

## 2015-06-11 DIAGNOSIS — Z1211 Encounter for screening for malignant neoplasm of colon: Secondary | ICD-10-CM

## 2015-06-11 DIAGNOSIS — G4733 Obstructive sleep apnea (adult) (pediatric): Secondary | ICD-10-CM | POA: Diagnosis not present

## 2015-06-11 DIAGNOSIS — K219 Gastro-esophageal reflux disease without esophagitis: Secondary | ICD-10-CM | POA: Diagnosis not present

## 2015-06-11 DIAGNOSIS — K649 Unspecified hemorrhoids: Secondary | ICD-10-CM | POA: Diagnosis not present

## 2015-06-11 DIAGNOSIS — I10 Essential (primary) hypertension: Secondary | ICD-10-CM | POA: Diagnosis not present

## 2015-06-11 MED ORDER — SODIUM CHLORIDE 0.9 % IV SOLN: 500.0000 mL | INTRAVENOUS | Status: DC

## 2015-06-11 NOTE — Op Note (Signed)
[facility] Patient Name: Jasmine Rollins Procedure Date: 06/11/2015 7:34 AM MRN: [id number] Endoscopist: Napoleon Form , MD Age: 65 Date of Birth: 13-Aug-1950 Gender: Female Procedure:                Colonoscopy Indications:              Screening for colorectal malignant neoplasm, Last                            colonoscopy 10 years ago Medicines:                Monitored Anesthesia Care Procedure:                Pre-Anesthesia Assessment:                           - Prior to the procedure, a History and Physical                            was performed, and patient medications and                            allergies were reviewed. The patient's tolerance of                            previous anesthesia was also reviewed. The risks                            and benefits of the procedure and the sedation                            options and risks were discussed with the patient.                            All questions were answered, and informed consent                            was obtained. Prior Anticoagulants: The patient has                            taken no previous anticoagulant or antiplatelet                            agents. ASA Grade Assessment: II - A patient with                            mild systemic disease. After reviewing the risks                            and benefits, the patient was deemed in                            satisfactory condition to undergo the procedure.  After obtaining informed consent, the colonoscope                            was passed under direct vision. Throughout the                            procedure, the patient's blood pressure, pulse, and                            oxygen saturations were monitored continuously. The                            Model CF-HQ190L [id number]) scope was introduced                            through the anus and advanced to the the terminal          ileum, with identification of the appendiceal                            orifice and IC valve. The colonoscopy was performed                            without difficulty. The patient tolerated the                            procedure well. The quality of the bowel                            preparation was adequate to identify polyps 6 mm                            and larger in size. The terminal ileum, ileocecal                            valve, appendiceal orifice, and rectum were                            photographed. Scope In: 8:39:42 AM Scope Out: 8:55:35 AM Scope Withdrawal Time: 0 hours 7 minutes 53 seconds  Total Procedure Duration: 0 hours 15 minutes 53 seconds  Findings:                 The perianal and digital rectal examinations were                            normal.                           Multiple small and large-mouthed diverticula were                            found in the sigmoid colon and descending colon.                            There was  narrowing of the colon in association                            with the diverticular opening. There was evidence                            of diverticular spasm. Erythema was seen in                            association with the diverticular opening. There                            was no evidence of diverticular bleeding.                           Non-bleeding internal hemorrhoids were found during                            retroflexion. The hemorrhoids were small. Complications:            No immediate complications. Estimated Blood Loss:     Estimated blood loss: none. Impression:               - Moderate diverticulosis in the sigmoid colon and                            in the descending colon. There was narrowing of the                            colon in association with the diverticular opening.                            There was evidence of diverticular spasm. Erythema                            was  seen in association with the diverticular                            opening. There was no evidence of diverticular                            bleeding.                           - Non-bleeding internal hemorrhoids.                           - No specimens collected. Recommendation:           - Resume previous diet.                           - Continue present medications.                           - Repeat colonoscopy in 10 years for screening  purposes.                           - Return to GI clinic PRN. Napoleon Form, MD 06/11/2015 9:03:58 AM This report has been signed electronically.

## 2015-06-11 NOTE — Patient Instructions (Signed)
Discharge instructions given. Handouts on diverticulosis and hemorrhoids. Resume previous medications. YOU HAD AN ENDOSCOPIC PROCEDURE TODAY AT THE [facility]:   Refer to the procedure report that was given to you for any specific questions about what was found during the examination.  If the procedure report does not answer your questions, please call your gastroenterologist to clarify.  If you requested that your care partner not be given the details of your procedure findings, then the procedure report has been included in a sealed envelope for you to review at your convenience later.  YOU SHOULD EXPECT: Some feelings of bloating in the abdomen. Passage of more gas than usual.  Walking can help get rid of the air that was put into your GI tract during the procedure and reduce the bloating. If you had a lower endoscopy (such as a colonoscopy or flexible sigmoidoscopy) you may notice spotting of blood in your stool or on the toilet paper. If you underwent a bowel prep for your procedure, you may not have a normal bowel movement for a few days.  Please Note:  You might notice some irritation and congestion in your nose or some drainage.  This is from the oxygen used during your procedure.  There is no need for concern and it should clear up in a day or so.  SYMPTOMS TO REPORT IMMEDIATELY:   Following lower endoscopy (colonoscopy or flexible sigmoidoscopy):  Excessive amounts of blood in the stool  Significant tenderness or worsening of abdominal pains  Swelling of the abdomen that is new, acute  Fever of 100F or higher   For urgent or emergent issues, a gastroenterologist can be reached at any hour by calling [phone].   DIET: Your first meal following the procedure should be a small meal and then it is ok to progress to your normal diet. Heavy or fried foods are harder to digest and may make you feel nauseous or bloated.  Likewise, meals heavy in dairy and vegetables can  increase bloating.  Drink plenty of fluids but you should avoid alcoholic beverages for 24 hours.  ACTIVITY:  You should plan to take it easy for the rest of today and you should NOT DRIVE or use heavy machinery until tomorrow (because of the sedation medicines used during the test).    FOLLOW UP: Our staff will call the number listed on your records the next business day following your procedure to check on you and address any questions or concerns that you may have regarding the information given to you following your procedure. If we do not reach you, we will leave a message.  However, if you are feeling well and you are not experiencing any problems, there is no need to return our call.  We will assume that you have returned to your regular daily activities without incident.  If any biopsies were taken you will be contacted by phone or by letter within the next 1-3 weeks.  Please call us at [phone] if you have not heard about the biopsies in 3 weeks.    SIGNATURES/CONFIDENTIALITY: You and/or your care partner have signed paperwork which will be entered into your electronic medical record.  These signatures attest to the fact that that the information above on your After Visit Summary has been reviewed and is understood.  Full responsibility of the confidentiality of this discharge information lies with you and/or your care-partner. 

## 2015-06-11 NOTE — Progress Notes (Signed)
Stable to RR 

## 2015-06-12 ENCOUNTER — Telehealth: Payer: Self-pay

## 2015-06-12 ENCOUNTER — Encounter: Payer: Self-pay | Admitting: Family Medicine

## 2015-06-12 ENCOUNTER — Ambulatory Visit (INDEPENDENT_AMBULATORY_CARE_PROVIDER_SITE_OTHER): Payer: Medicare Other | Admitting: Family Medicine

## 2015-06-12 VITALS — BP 128/80 | HR 84 | Temp 98.1°F | Ht 62.25 in | Wt 133.0 lb

## 2015-06-12 DIAGNOSIS — E7800 Pure hypercholesterolemia, unspecified: Secondary | ICD-10-CM | POA: Diagnosis not present

## 2015-06-12 DIAGNOSIS — R739 Hyperglycemia, unspecified: Secondary | ICD-10-CM

## 2015-06-12 DIAGNOSIS — I10 Essential (primary) hypertension: Secondary | ICD-10-CM

## 2015-06-12 NOTE — Progress Notes (Signed)
Subjective:    Patient ID: Jasmine Rollins, female    DOB: 16-Nov-1950, 65 y.o.   MRN: [id number]  HPI Here for f/u of chronic medical problems  Taking care of herself  Had a colonoscopy and it came out ok - 10 year recall   Wt is stable with bmi of 24  bp is stable today  No cp or palpitations or headaches or edema  No side effects to medicines  BP Readings from Last 3 Encounters:  06/12/15 132/80  06/11/15 137/81  04/29/15 128/84      Had hep C screen which was negative  Hyperglycemia Lab Results  Component Value Date   HGBA1C 6.4 06/08/2015   This is up from 6.1 She was sick for a while and did not exercise at all  Is back to regular exercise now - uses a treadmill and also has DVD program for exercise/walking  Goes 2-4 miles  Diet is good overall  She eats small portions  Her husband eats very unhealthy - so it is hard on her  She likes to cook  Eats a lot more salads Does eat sweets "too much" - is trying to cut back on sugar - for instance in coffee (adding sugar to things)  Loves bread (ate too much carbs when she was sick)  Has fam hx of DM2   Cholesterol Lab Results  Component Value Date   CHOL 212* 06/08/2015   CHOL 202* 12/03/2014   CHOL 210* 06/04/2014   Lab Results  Component Value Date   HDL 80.30 06/08/2015   HDL 79.90 12/03/2014   HDL 77.10 06/04/2014   Lab Results  Component Value Date   LDLCALC 112* 06/08/2015   LDLCALC 109* 12/03/2014   LDLCALC 117* 06/04/2014   Lab Results  Component Value Date   TRIG 96.0 06/08/2015   TRIG 68.0 12/03/2014   TRIG 79.0 06/04/2014   Lab Results  Component Value Date   CHOLHDL 3 06/08/2015   CHOLHDL 3 12/03/2014   CHOLHDL 3 06/04/2014   Lab Results  Component Value Date   LDLDIRECT 106.4 02/26/2013   LDLDIRECT 104.2 09/27/2011   LDLDIRECT 86.7 11/27/2007  diet controlled  Stable overall  HDL up a bit and LDL is up a bit    Patient Active Problem List   Diagnosis Date Noted  .  Rhomboid muscle strain 12/09/2014  . GERD (gastroesophageal reflux disease) 06/09/2014  . Encounter for routine gynecological examination 03/03/2014  . Estrogen deficiency 03/03/2014  . Rash and nonspecific skin eruption 12/06/2013  . Heartburn 03/01/2013  . Elevated liver enzymes 03/01/2013  . Gynecological examination 06/22/2010  . Special screening for malignant neoplasms, colon 06/22/2010  . Glaucoma suspect 06/22/2010  . Routine general medical examination at a health care facility 06/03/2010  . Sleep apnea   . Osteoporosis 05/20/2009  . BACK PAIN, LUMBAR 02/02/2009  . SLEEP APNEA 10/17/2008  . CERVICAL LYMPHADENOPATHY 09/16/2008  . COUGH VARIANT ASTHMA 07/30/2008  . LEUKOCYTOPENIA UNSPECIFIED 01/29/2008  . Hyperglycemia 11/27/2007  . HYPERCHOLESTEROLEMIA 03/21/2007  . Essential hypertension 03/21/2007  . Sinusitis, chronic 03/21/2007  . ALLERGIC RHINITIS 03/21/2007  . DIVERTICULOSIS, COLON 03/21/2007   Past Medical History  Diagnosis Date  . Allergic rhinitis   . Diverticulosis of colon   . HTN (hypertension)   . Cough variant asthma   . Borderline high cholesterol   . Hyperglycemia     borderline DM  . GERD (gastroesophageal reflux disease)   . OP (osteoporosis)   .  Seasonal allergies   . Arthritis     hip  . Cataract   . Sleep apnea     no cpap   Past Surgical History  Procedure Laterality Date  . Tubal ligation    . Colposcopy    . Rotator cuff repair      right  . Esophagogastroduodenoscopy  10/01    Negative  . Colonoscopy     Social History  Substance Use Topics  . Smoking status: Never Smoker   . Smokeless tobacco: Never Used     Comment: Remote 2nd hand exposure  . Alcohol Use: No   Family History  Problem Relation Age of Onset  . Hypertension Father   . Diabetes Father   . Diabetes Brother   . Diabetes Brother   . Cancer Brother     prostate  . Cancer Brother     prostate  . Cancer Brother     prostate  . Cancer Brother      prostate  . Cancer Brother     prostate  . Cancer Brother     prostate  . Colon cancer Neg Hx    Allergies  Allergen Reactions  . Sulfonamide Derivatives    Current Outpatient Prescriptions on File Prior to Visit  Medication Sig Dispense Refill  . albuterol (PROVENTIL HFA) 108 (90 Base) MCG/ACT inhaler INHALE 2 PUFFS EVERY 4 HOURS AS NEEDED 3 Inhaler 3  . BENICAR HCT 40-12.5 MG tablet TAKE 1 TABLET DAILY 90 tablet 0  . Calcium Carb-Cholecalciferol (CALCIUM 600+D3) 600-800 MG-UNIT TABS Take by mouth.    . fexofenadine (ALLEGRA) 180 MG tablet Take 180 mg by mouth daily.      . fluticasone (FLONASE) 50 MCG/ACT nasal spray USE 2 SPRAYS IN EACH NOSTRIL DAILY 48 g 1  . latanoprost (XALATAN) 0.005 % ophthalmic solution     . Multiple Vitamins-Minerals (MULTIVITAMIN & MINERAL PO) Take by mouth.    . ranitidine (ZANTAC) 150 MG capsule Take 150 mg by mouth 2 (two) times daily.     No current facility-administered medications on file prior to visit.    Review of Systems Review of Systems  Constitutional: Negative for fever, appetite change, fatigue and unexpected weight change. neg for polydipsia  Eyes: Negative for pain and visual disturbance.  Respiratory: Negative for cough and shortness of breath.   Cardiovascular: Negative for cp or palpitations    Gastrointestinal: Negative for nausea, diarrhea and constipation.  Genitourinary: Negative for urgency and frequency.  Skin: Negative for pallor or rash   Neurological: Negative for weakness, light-headedness, numbness and headaches.  Hematological: Negative for adenopathy. Does not bruise/bleed easily.  Psychiatric/Behavioral: Negative for dysphoric mood. The patient is not nervous/anxious.         Objective:   Physical Exam  Constitutional: She appears well-developed and well-nourished. No distress.  Well app  HENT:  Head: Normocephalic and atraumatic.  Mouth/Throat: Oropharynx is clear and moist.  Eyes: Conjunctivae and EOM are  normal. Pupils are equal, round, and reactive to light.  Neck: Normal range of motion. Neck supple. No JVD present. Carotid bruit is not present. No thyromegaly present.  Cardiovascular: Normal rate, regular rhythm, normal heart sounds and intact distal pulses.  Exam reveals no gallop.   Pulmonary/Chest: Effort normal and breath sounds normal. No respiratory distress. She has no wheezes. She has no rales.  No crackles  Abdominal: Soft. Bowel sounds are normal. She exhibits no distension, no abdominal bruit and no mass. There is no tenderness.  Musculoskeletal: She exhibits no edema.  Lymphadenopathy:    She has no cervical adenopathy.  Neurological: She is alert. She has normal reflexes.  Skin: Skin is warm and dry. No rash noted.  Psychiatric: She has a normal mood and affect.          Assessment & Plan:   Problem List Items Addressed This Visit      Cardiovascular and Mediastinum   Essential hypertension - Primary    bp in fair control at this time  BP Readings from Last 1 Encounters:  06/12/15 128/80   No changes needed Disc lifstyle change with low sodium diet and exercise  Labs reviewed         Other   HYPERCHOLESTEROLEMIA    LDL is up slt Disc goals for lipids and reasons to control them Rev labs with pt Rev low sat fat diet in detail       Hyperglycemia    Lab Results  Component Value Date   HGBA1C 6.4 06/08/2015   This is up  Stressed imp of low glycemic diet and wt control to prevent DM Pt has a plan for diet and exercise

## 2015-06-12 NOTE — Patient Instructions (Addendum)
Your blood sugar is more elevated this time I want to prevent diabetes  Decrease sweets Also carbs  Keep exercising  Follow up in 6 months for your annual exam with labs prior  Take care of yourself

## 2015-06-12 NOTE — Telephone Encounter (Signed)
  Follow up Call-  Call back number 06/11/2015  Post procedure Call Back phone  # [phone]  Permission to leave phone message Yes     Patient questions:  Do you have a fever, pain , or abdominal swelling? No. Pain Score  0 *  Have you tolerated food without any problems? Yes.    Have you been able to return to your normal activities? Yes.    Do you have any questions about your discharge instructions: Diet   No. Medications  No. Follow up visit  No.  Do you have questions or concerns about your Care? No.  Actions: * If pain score is 4 or above: No action needed, pain <4.

## 2015-06-12 NOTE — Progress Notes (Signed)
Pre visit review using our clinic review tool, if applicable. No additional management support is needed unless otherwise documented below in the visit note. 

## 2015-06-14 NOTE — Assessment & Plan Note (Signed)
Lab Results  Component Value Date   HGBA1C 6.4 06/08/2015   This is up  Stressed imp of low glycemic diet and wt control to prevent DM Pt has a plan for diet and exercise

## 2015-06-14 NOTE — Assessment & Plan Note (Signed)
bp in fair control at this time  BP Readings from Last 1 Encounters:  06/12/15 128/80   No changes needed Disc lifstyle change with low sodium diet and exercise  Labs reviewed  

## 2015-06-14 NOTE — Assessment & Plan Note (Signed)
LDL is up slt Disc goals for lipids and reasons to control them Rev labs with pt Rev low sat fat diet in detail

## 2015-06-17 ENCOUNTER — Ambulatory Visit (INDEPENDENT_AMBULATORY_CARE_PROVIDER_SITE_OTHER): Payer: Medicare Other | Admitting: Family Medicine

## 2015-06-17 ENCOUNTER — Encounter: Payer: Self-pay | Admitting: Family Medicine

## 2015-06-17 VITALS — BP 142/84 | HR 92 | Temp 98.1°F | Ht 62.25 in | Wt 134.5 lb

## 2015-06-17 DIAGNOSIS — J302 Other seasonal allergic rhinitis: Secondary | ICD-10-CM | POA: Diagnosis not present

## 2015-06-17 DIAGNOSIS — J45991 Cough variant asthma: Secondary | ICD-10-CM

## 2015-06-17 DIAGNOSIS — J069 Acute upper respiratory infection, unspecified: Secondary | ICD-10-CM | POA: Diagnosis not present

## 2015-06-17 DIAGNOSIS — B9789 Other viral agents as the cause of diseases classified elsewhere: Principal | ICD-10-CM

## 2015-06-17 MED ORDER — MONTELUKAST SODIUM 10 MG PO TABS: 10.0000 mg | ORAL_TABLET | Freq: Every day | ORAL | Status: DC

## 2015-06-17 NOTE — Assessment & Plan Note (Signed)
Allergen avoidance discussed Continue current medicines (including trial of claritin) singulair added for cough variant asthma - this may also help nasal symptoms a bit  Will update

## 2015-06-17 NOTE — Patient Instructions (Addendum)
Try singulair in season for allergies and cough variant asthma  mucinex is fine to loosen congestion congestion if needed  I think you have a head and chest cold also  Use the Cheratussin for cough as needed- watch out for sedation  It takes a sinus infection 10-14 days to develop out of a cold -watch for facial pain or getting better and then worse again - keep me posted  Drink lots of fluids Continue present medicines   Update if not starting to improve in a week or if worsening

## 2015-06-17 NOTE — Progress Notes (Signed)
Pre visit review using our clinic review tool, if applicable. No additional management support is needed unless otherwise documented below in the visit note. 

## 2015-06-17 NOTE — Assessment & Plan Note (Signed)
Day 2 - may get worse before better Disc sinusitis symptoms to watch for Disc symptomatic care - see instructions on AVS  Update if not starting to improve in a week or if worsening  Handout given on viral uri care

## 2015-06-17 NOTE — Assessment & Plan Note (Signed)
During her worse season  Albuterol mdi helps some  In past - steroid inhaler was minimally affective Will try singulair 10 mg at bedtime - update if not helpful  This may also help her nasal symptoms somewhat

## 2015-06-17 NOTE — Progress Notes (Signed)
Subjective:    Patient ID: Jasmine RoyaltyMattie E Bohlen, female    DOB: 02-28-1950, 65 y.o.   MRN: [id number]  HPI  Here with uri symptoms for several days   Elevated temp 99 - a little chills/no aches  Stuffy and runny nose  (did miss allergy med for 2 days) -of note she changed to claritin recently to see if it helps  Not a lot of sneezing  Also clear nasal discharge - starting to turn yellow  No facial pain   Much cough- very deep  Productive - clear to a little yellow  This feels like her asthma cough (has cough variant asthma)  No wheezing    Using her albuterol - inhaler- about evey 6-8 hours -helps just a little  No longer on a steroid inhaler    Used some old cough syrup last night   Patient Active Problem List   Diagnosis Date Noted  . Rhomboid muscle strain 12/09/2014  . GERD (gastroesophageal reflux disease) 06/09/2014  . Encounter for routine gynecological examination 03/03/2014  . Estrogen deficiency 03/03/2014  . Rash and nonspecific skin eruption 12/06/2013  . Heartburn 03/01/2013  . Elevated liver enzymes 03/01/2013  . Gynecological examination 06/22/2010  . Special screening for malignant neoplasms, colon 06/22/2010  . Glaucoma suspect 06/22/2010  . Routine general medical examination at a health care facility 06/03/2010  . Sleep apnea   . Osteoporosis 05/20/2009  . BACK PAIN, LUMBAR 02/02/2009  . SLEEP APNEA 10/17/2008  . CERVICAL LYMPHADENOPATHY 09/16/2008  . COUGH VARIANT ASTHMA 07/30/2008  . LEUKOCYTOPENIA UNSPECIFIED 01/29/2008  . Hyperglycemia 11/27/2007  . HYPERCHOLESTEROLEMIA 03/21/2007  . Essential hypertension 03/21/2007  . Sinusitis, chronic 03/21/2007  . ALLERGIC RHINITIS 03/21/2007  . DIVERTICULOSIS, COLON 03/21/2007   Past Medical History  Diagnosis Date  . Allergic rhinitis   . Diverticulosis of colon   . HTN (hypertension)   . Cough variant asthma   . Borderline high cholesterol   . Hyperglycemia     borderline DM  . GERD  (gastroesophageal reflux disease)   . OP (osteoporosis)   . Seasonal allergies   . Arthritis     hip  . Cataract   . Sleep apnea     no cpap   Past Surgical History  Procedure Laterality Date  . Tubal ligation    . Colposcopy    . Rotator cuff repair      right  . Esophagogastroduodenoscopy  10/01    Negative  . Colonoscopy     Social History  Substance Use Topics  . Smoking status: Never Smoker   . Smokeless tobacco: Never Used     Comment: Remote 2nd hand exposure  . Alcohol Use: No   Family History  Problem Relation Age of Onset  . Hypertension Father   . Diabetes Father   . Diabetes Brother   . Diabetes Brother   . Cancer Brother     prostate  . Cancer Brother     prostate  . Cancer Brother     prostate  . Cancer Brother     prostate  . Cancer Brother     prostate  . Cancer Brother     prostate  . Colon cancer Neg Hx    Allergies  Allergen Reactions  . Sulfonamide Derivatives    Current Outpatient Prescriptions on File Prior to Visit  Medication Sig Dispense Refill  . albuterol (PROVENTIL HFA) 108 (90 Base) MCG/ACT inhaler INHALE 2 PUFFS EVERY 4 HOURS AS NEEDED 3 Inhaler  3  . BENICAR HCT 40-12.5 MG tablet TAKE 1 TABLET DAILY 90 tablet 0  . Calcium Carb-Cholecalciferol (CALCIUM 600+D3) 600-800 MG-UNIT TABS Take by mouth.    . fexofenadine (ALLEGRA) 180 MG tablet Take 180 mg by mouth daily.      . fluticasone (FLONASE) 50 MCG/ACT nasal spray USE 2 SPRAYS IN EACH NOSTRIL DAILY 48 g 1  . latanoprost (XALATAN) 0.005 % ophthalmic solution     . Multiple Vitamins-Minerals (MULTIVITAMIN & MINERAL PO) Take by mouth.    . ranitidine (ZANTAC) 150 MG capsule Take 150 mg by mouth 2 (two) times daily.     No current facility-administered medications on file prior to visit.     Review of Systems  Constitutional: Positive for chills, appetite change and fatigue. Negative for fever.  HENT: Positive for congestion, ear discharge, postnasal drip, rhinorrhea, sinus  pressure, sneezing and sore throat. Negative for ear pain.   Eyes: Negative for pain and discharge.  Respiratory: Positive for cough. Negative for shortness of breath, wheezing and stridor.   Cardiovascular: Negative for chest pain.  Gastrointestinal: Negative for nausea, vomiting and diarrhea.  Genitourinary: Negative for urgency, frequency and hematuria.  Musculoskeletal: Negative for myalgias and arthralgias.  Skin: Negative for rash.  Neurological: Positive for headaches. Negative for dizziness, weakness and light-headedness.  Psychiatric/Behavioral: Negative for confusion and dysphoric mood.       Objective:   Physical Exam  Constitutional: She appears well-developed and well-nourished. No distress.  Well appearing with hacking cough  HENT:  Head: Normocephalic and atraumatic.  Right Ear: External ear normal.  Left Ear: External ear normal.  Mouth/Throat: Oropharynx is clear and moist.  Nares are injected and congested  No sinus tenderness Clear rhinorrhea and post nasal drip   Eyes: Conjunctivae and EOM are normal. Pupils are equal, round, and reactive to light. Right eye exhibits no discharge. Left eye exhibits no discharge.  Neck: Normal range of motion. Neck supple.  Cardiovascular: Normal rate and normal heart sounds.   Pulmonary/Chest: Effort normal and breath sounds normal. No respiratory distress. She has no wheezes. She has no rales. She exhibits no tenderness.  Hacking cough Upper airway sounds heard No wheeze or prolonged exp phase   Musculoskeletal: She exhibits no edema.  Lymphadenopathy:    She has no cervical adenopathy.  Neurological: She is alert.  Skin: Skin is warm and dry. No rash noted.  Psychiatric: She has a normal mood and affect.          Assessment & Plan:   Problem List Items Addressed This Visit      Respiratory   Allergic rhinitis    Allergen avoidance discussed Continue current medicines (including trial of claritin) singulair  added for cough variant asthma - this may also help nasal symptoms a bit  Will update       COUGH VARIANT ASTHMA - Primary    During her worse season  Albuterol mdi helps some  In past - steroid inhaler was minimally affective Will try singulair 10 mg at bedtime - update if not helpful  This may also help her nasal symptoms somewhat       Relevant Medications   montelukast (SINGULAIR) 10 MG tablet   Viral URI with cough    Day 2 - may get worse before better Disc sinusitis symptoms to watch for Disc symptomatic care - see instructions on AVS  Update if not starting to improve in a week or if worsening  Handout given on viral uri  care

## 2015-06-23 ENCOUNTER — Telehealth: Payer: Self-pay

## 2015-06-23 MED ORDER — AMOXICILLIN-POT CLAVULANATE 875-125 MG PO TABS: 1.0000 | ORAL_TABLET | Freq: Two times a day (BID) | ORAL | Status: DC

## 2015-06-23 MED ORDER — PREDNISONE 10 MG PO TABS: ORAL_TABLET | ORAL | Status: DC

## 2015-06-23 NOTE — Telephone Encounter (Signed)
Patient advised.

## 2015-06-23 NOTE — Telephone Encounter (Signed)
I sent augmentin and pred[facility]one [facility] -please let her know  F/u if no improvement

## 2015-06-23 NOTE — Telephone Encounter (Signed)
Pt left /vm; pt was seen 06/17/15 and pt is not any better; prod cough has not improved, still run low grade fever; pt request abx and steroid; pt taking singulair and cough med. [facility]. Pt request cb.

## 2015-06-24 ENCOUNTER — Other Ambulatory Visit: Payer: Self-pay | Admitting: Family Medicine

## 2015-06-29 ENCOUNTER — Other Ambulatory Visit: Payer: Self-pay

## 2015-06-29 DIAGNOSIS — Z1231 Encounter for screening mammogram for malignant neoplasm of breast: Secondary | ICD-10-CM

## 2015-08-09 ENCOUNTER — Other Ambulatory Visit: Payer: Self-pay | Admitting: Family Medicine

## 2015-08-12 ENCOUNTER — Ambulatory Visit
Admission: RE | Admit: 2015-08-12 | Discharge: 2015-08-12 | Disposition: A | Discharge status: Home / Self Care | Payer: Medicare Other | Source: Ambulatory Visit

## 2015-08-12 DIAGNOSIS — Z1231 Encounter for screening mammogram for malignant neoplasm of breast: Secondary | ICD-10-CM | POA: Diagnosis not present

## 2015-10-20 ENCOUNTER — Emergency Department (HOSPITAL_COMMUNITY)
Admission: EM | Admit: 2015-10-20 | Discharge: 2015-10-21 | Disposition: A | Discharge status: Home / Self Care | Payer: Medicare Other | Attending: Emergency Medicine | Admitting: Emergency Medicine

## 2015-10-20 ENCOUNTER — Encounter (HOSPITAL_COMMUNITY): Payer: Self-pay

## 2015-10-20 DIAGNOSIS — N3001 Acute cystitis with hematuria: Secondary | ICD-10-CM | POA: Diagnosis not present

## 2015-10-20 DIAGNOSIS — I10 Essential (primary) hypertension: Secondary | ICD-10-CM | POA: Diagnosis not present

## 2015-10-20 DIAGNOSIS — Z79899 Other long term (current) drug therapy: Secondary | ICD-10-CM | POA: Diagnosis not present

## 2015-10-20 DIAGNOSIS — R319 Hematuria, unspecified: Secondary | ICD-10-CM | POA: Diagnosis present

## 2015-10-20 NOTE — ED Triage Notes (Signed)
Pt presents with vaginal bleeding when she wipes and also noting some blood in her urine. Pt also c/o pain in her pelvic area and in her back. Pt has an appointment with her doctor tomorrow but reports that the pain is becoming worse.

## 2015-10-20 NOTE — ED Provider Notes (Addendum)
[facility]-EMERGENCY DEPT Provider Note   CSN: [id number] Arrival date & time: 10/20/15  2125  By signing my name below, I, Jasmine Rollins, attest that this documentation has been prepared under the direction and in the presence of physician practitioner, Maximiano Lott, MD. Electronically Signed: Linna Rollins, Scribe. 10/21/2015. 12:26 AM.  History   Chief Complaint Chief Complaint  Patient presents with  . Hematuria    The history is provided by the patient. No language interpreter was used.  Hematuria  This is a new problem. The current episode started 12 to 24 hours ago. The problem occurs constantly. The problem has not changed since onset.Pertinent negatives include no chest pain, no abdominal pain, no headaches and no shortness of breath. Nothing aggravates the symptoms. Nothing relieves the symptoms. She has tried nothing for the symptoms. The treatment provided no relief.     HPI Comments: Jasmine Rollins is a 65 y.o. female who presents to the Emergency Department complaining of sudden onset, constant, hematuria beginning yesterday morning. She endorses associated dysuria, frequency, and urgency. She reports she recently took a bubble bath prior to onset of her symptoms. Pt notes h/o bladder infection but states she has not had one in years. She notes an allergy to septra. She denies wearing new underwear or going swimming lately. She further denies difficulty urinating or any other associated symptoms.  Abdomen soft Hyperactive bowel sounds RRR lungs clear Intact DP and DTR's EOM PERRL Moist mucouis membraens no exudate    Past Medical History:  Diagnosis Date  . Allergic rhinitis   . Arthritis    hip  . Borderline high cholesterol   . Cataract   . Cough variant asthma   . Diverticulosis of colon   . GERD (gastroesophageal reflux disease)   . HTN (hypertension)   . Hyperglycemia    borderline DM  . OP (osteoporosis)   . Seasonal allergies   . Sleep apnea    no cpap     Patient Active Problem List   Diagnosis Date Noted  . Viral URI with cough 06/17/2015  . Rhomboid muscle strain 12/09/2014  . GERD (gastroesophageal reflux disease) 06/09/2014  . Encounter for routine gynecological examination 03/03/2014  . Estrogen deficiency 03/03/2014  . Rash and nonspecific skin eruption 12/06/2013  . Heartburn 03/01/2013  . Elevated liver enzymes 03/01/2013  . Gynecological examination 06/22/2010  . Special screening for malignant neoplasms, colon 06/22/2010  . Glaucoma suspect 06/22/2010  . Routine general medical examination at a health care facility 06/03/2010  . Sleep apnea   . Osteoporosis 05/20/2009  . BACK PAIN, LUMBAR 02/02/2009  . SLEEP APNEA 10/17/2008  . CERVICAL LYMPHADENOPATHY 09/16/2008  . COUGH VARIANT ASTHMA 07/30/2008  . LEUKOCYTOPENIA UNSPECIFIED 01/29/2008  . Hyperglycemia 11/27/2007  . HYPERCHOLESTEROLEMIA 03/21/2007  . Essential hypertension 03/21/2007  . Sinusitis, chronic 03/21/2007  . Allergic rhinitis 03/21/2007  . DIVERTICULOSIS, COLON 03/21/2007    Past Surgical History:  Procedure Laterality Date  . COLONOSCOPY    . COLPOSCOPY    . ESOPHAGOGASTRODUODENOSCOPY  10/01   Negative  . ROTATOR CUFF REPAIR     right  . TUBAL LIGATION      OB History    No data available       Home Medications    Prior to Admission medications   Medication Sig Start Date End Date Taking? Authorizing Provider  albuterol (PROVENTIL HFA) 108 (90 Base) MCG/ACT inhaler INHALE 2 PUFFS EVERY 4 HOURS AS NEEDED 04/29/15   Judy Pimple, MD  amoxicillin-clavulanate (AUGMENTIN) 875-125 MG tablet Take 1 tablet by mouth 2 (two) times daily. 06/23/15   Judy PimpleMarne A Tower, MD  BENICAR HCT 40-12.5 MG tablet TAKE 1 TABLET DAILY 06/24/15   Judy PimpleMarne A Tower, MD  Calcium Carb-Cholecalciferol (CALCIUM 600+D3) 600-800 MG-UNIT TABS Take by mouth.    Historical Provider, MD  fexofenadine (ALLEGRA) 180 MG tablet Take 180 mg by mouth daily.      Historical Provider, MD   fluticasone (FLONASE) 50 MCG/ACT nasal spray USE 2 SPRAYS IN EACH NOSTRIL DAILY 08/10/15   Judy PimpleMarne A Tower, MD  latanoprost (XALATAN) 0.005 % ophthalmic solution  04/16/15   Historical Provider, MD  montelukast (SINGULAIR) 10 MG tablet Take 1 tablet (10 mg total) by mouth at bedtime. 06/17/15   Judy PimpleMarne A Tower, MD  Multiple Vitamins-Minerals (MULTIVITAMIN & MINERAL PO) Take by mouth.    Historical Provider, MD  predniSONE (DELTASONE) 10 MG tablet Take 3 pills once daily by mouth for 3 days, then 2 pills once daily for 3 days, then 1 pill once daily for 3 days and then stop 06/23/15   Judy PimpleMarne A Tower, MD  ranitidine (ZANTAC) 150 MG capsule Take 150 mg by mouth 2 (two) times daily.    Historical Provider, MD    Family History Family History  Problem Relation Age of Onset  . Hypertension Father   . Diabetes Father   . Diabetes Brother   . Diabetes Brother   . Cancer Brother     prostate  . Cancer Brother     prostate  . Cancer Brother     prostate  . Cancer Brother     prostate  . Cancer Brother     prostate  . Cancer Brother     prostate  . Colon cancer Neg Hx     Social History Social History  Substance Use Topics  . Smoking status: Never Smoker  . Smokeless tobacco: Never Used     Comment: Remote 2nd hand exposure  . Alcohol use No     Allergies   Sulfonamide derivatives   Review of Systems Review of Systems  Constitutional: Negative for fever.  Respiratory: Negative for shortness of breath.   Cardiovascular: Negative for chest pain.  Gastrointestinal: Negative for abdominal pain and vomiting.  Genitourinary: Positive for dysuria, frequency, hematuria and urgency.  Neurological: Negative for headaches.  All other systems reviewed and are negative.    Physical Exam Updated Vital Signs BP 152/93 (BP Location: Right Arm)   Pulse 75   Temp 98.4 F (36.9 C) (Oral)   Resp 20   Ht 5\' 3"  (1.6 m)   Wt 133 lb (60.3 kg)   SpO2 100%   BMI 23.56 kg/m   Physical Exam   Constitutional: She appears well-developed and well-nourished.  HENT:  Head: Normocephalic.  Mouth/Throat: Oropharynx is clear and moist. No oropharyngeal exudate.  Eyes: Conjunctivae and EOM are normal. Pupils are equal, round, and reactive to light. Right eye exhibits no discharge. Left eye exhibits no discharge. No scleral icterus.  Neck: Normal range of motion. Neck supple. No JVD present. No tracheal deviation present.  Trachea is midline. No stridor or carotid bruits.   Cardiovascular: Normal rate, regular rhythm, normal heart sounds and intact distal pulses.   No murmur heard. Pulmonary/Chest: Effort normal and breath sounds normal. No stridor. No respiratory distress. She has no wheezes. She has no rales.  Lungs CTA bilaterally.  Abdominal: Soft. Bowel sounds are normal. She exhibits no distension and no mass. There  is no tenderness. There is no rebound and no guarding.  Musculoskeletal: Normal range of motion. She exhibits no edema or tenderness.  Lymphadenopathy:    She has no cervical adenopathy.  Neurological: She is alert. She has normal reflexes. She displays normal reflexes. She exhibits normal muscle tone.  Skin: Skin is warm and dry. Capillary refill takes less than 2 seconds.  Psychiatric: She has a normal mood and affect. Her behavior is normal.  Nursing note and vitals reviewed.   Vitals:   10/20/15 2229  BP: 152/93  Pulse: 75  Resp: 20  Temp: 98.4 F (36.9 C)    ED Treatments / Results  Labs (all labs ordered are listed, but only abnormal results are displayed) Labs Reviewed  URINALYSIS, ROUTINE W REFLEX MICROSCOPIC (NOT AT [facility]) - Abnormal; Notable for the following:       Result Value   Color, Urine RED (*)    APPearance TURBID (*)    Hgb urine dipstick LARGE (*)    Bilirubin Urine SMALL (*)    Protein, ur 100 (*)    Nitrite POSITIVE (*)    Leukocytes, UA LARGE (*)    All other components within normal limits  URINE MICROSCOPIC-ADD ON - Abnormal;  Notable for the following:    Squamous Epithelial / LPF 0-5 (*)    Bacteria, UA MANY (*)    All other components within normal limits   Medications  cephALEXin (KEFLEX) capsule 500 mg (not administered)  pyridOXINE (B-6) injection 100 mg (not administered)    Radiology No results found.  Procedures Procedures (including critical care time)  Medications Ordered in ED Medications - No data to display   Initial Impression / Assessment and Plan / ED Course  I have reviewed the triage vital signs and the nursing notes.  Pertinent labs & imaging results that were available during my care of the patient were reviewed by me and considered in my medical decision making (see chart for details).  Clinical Course   Medications  phenazopyridine (PYRIDIUM) tablet 200 mg (200 mg Oral Given 10/21/15 0107)  cephALEXin (KEFLEX) capsule 500 mg (500 mg Oral Given 10/21/15 0107)     Final Clinical Impressions(s) / ED Diagnoses   Final diagnoses:  None   All questions answered to patient's satisfaction. Based on history and exam patient has been appropriately medically screened and emergency conditions excluded. Patient is stable for discharge at this time. Follow up with your PMD for recheck in 2 days and strict return precautions given.    I personally performed the services described in this documentation, which was scribed in my presence. The recorded information has been reviewed and is accurate.    New Prescriptions New Prescriptions   No medications on file     Abigale Dorow, MD 10/21/15 0049    Caliope Ruppert, MD 10/21/15 [phone]

## 2015-10-21 ENCOUNTER — Ambulatory Visit: Payer: Medicare Other | Admitting: Family Medicine

## 2015-10-21 ENCOUNTER — Encounter (HOSPITAL_COMMUNITY): Payer: Self-pay | Admitting: Emergency Medicine

## 2015-10-21 DIAGNOSIS — N3001 Acute cystitis with hematuria: Secondary | ICD-10-CM | POA: Diagnosis not present

## 2015-10-21 LAB — URINALYSIS, ROUTINE W REFLEX MICROSCOPIC
GLUCOSE, UA: NEGATIVE mg/dL
KETONES UR: NEGATIVE mg/dL
Nitrite: POSITIVE — AB
PH: 6 (ref 5.0–8.0)
PROTEIN: 100 mg/dL — AB
Specific Gravity, Urine: 1.024 (ref 1.005–1.030)

## 2015-10-21 LAB — URINE MICROSCOPIC-ADD ON

## 2015-10-21 MED ORDER — CEPHALEXIN 500 MG PO CAPS
500.0000 mg | ORAL_CAPSULE | Freq: Once | ORAL | Status: AC
  Administered 2015-10-21: 500 mg via ORAL
  Filled 2015-10-21: qty 1

## 2015-10-21 MED ORDER — CEPHALEXIN 500 MG PO CAPS: 500.0000 mg | ORAL_CAPSULE | Freq: Four times a day (QID) | ORAL | 0 refills | Status: DC

## 2015-10-21 MED ORDER — PHENAZOPYRIDINE HCL 200 MG PO TABS
200.0000 mg | ORAL_TABLET | Freq: Three times a day (TID) | ORAL | Status: DC
  Administered 2015-10-21: 200 mg via ORAL
  Filled 2015-10-21: qty 1

## 2015-10-21 MED ORDER — PYRIDOXINE HCL 100 MG/ML IJ SOLN
100.0000 mg | Freq: Once | INTRAMUSCULAR | Status: DC
Start: 2015-10-21 — End: 2015-10-21
  Filled 2015-10-21: qty 1

## 2015-10-21 MED ORDER — PHENAZOPYRIDINE HCL 200 MG PO TABS: 200.0000 mg | ORAL_TABLET | Freq: Three times a day (TID) | ORAL | 0 refills | Status: DC

## 2015-10-21 NOTE — ED Notes (Signed)
Patient was alert, oriented and stable upon discharge. RN went over AVS and patient had no further questions.  

## 2015-12-06 ENCOUNTER — Telehealth: Payer: Self-pay | Admitting: Family Medicine

## 2015-12-06 DIAGNOSIS — I10 Essential (primary) hypertension: Secondary | ICD-10-CM

## 2015-12-06 DIAGNOSIS — R739 Hyperglycemia, unspecified: Secondary | ICD-10-CM

## 2015-12-06 DIAGNOSIS — E7800 Pure hypercholesterolemia, unspecified: Secondary | ICD-10-CM

## 2015-12-06 DIAGNOSIS — E559 Vitamin D deficiency, unspecified: Secondary | ICD-10-CM

## 2015-12-06 NOTE — Telephone Encounter (Signed)
-----   Message from Alvina Chouerri J Walsh sent at 12/02/2015 12:01 PM EDT ----- Regarding: Lab orders for Tuesday, 10.24.17 Patient is scheduled for CPX labs, please order future labs, Thanks , Camelia Engerri

## 2015-12-09 ENCOUNTER — Ambulatory Visit (INDEPENDENT_AMBULATORY_CARE_PROVIDER_SITE_OTHER): Payer: Medicare Other

## 2015-12-09 ENCOUNTER — Other Ambulatory Visit (INDEPENDENT_AMBULATORY_CARE_PROVIDER_SITE_OTHER): Payer: Medicare Other

## 2015-12-09 VITALS — BP 112/80 | HR 72 | Temp 98.5°F | Ht 62.5 in | Wt 133.8 lb

## 2015-12-09 DIAGNOSIS — E559 Vitamin D deficiency, unspecified: Secondary | ICD-10-CM

## 2015-12-09 DIAGNOSIS — Z23 Encounter for immunization: Secondary | ICD-10-CM

## 2015-12-09 DIAGNOSIS — E7800 Pure hypercholesterolemia, unspecified: Secondary | ICD-10-CM

## 2015-12-09 DIAGNOSIS — R739 Hyperglycemia, unspecified: Secondary | ICD-10-CM | POA: Diagnosis not present

## 2015-12-09 DIAGNOSIS — I10 Essential (primary) hypertension: Secondary | ICD-10-CM

## 2015-12-09 DIAGNOSIS — Z0000 Encounter for general adult medical examination without abnormal findings: Secondary | ICD-10-CM

## 2015-12-09 LAB — CBC WITH DIFFERENTIAL/PLATELET
BASOS ABS: 0 10*3/uL (ref 0.0–0.1)
Basophils Relative: 0.7 % (ref 0.0–3.0)
EOS PCT: 6.1 % — AB (ref 0.0–5.0)
Eosinophils Absolute: 0.2 10*3/uL (ref 0.0–0.7)
HCT: 42.6 % (ref 36.0–46.0)
Hemoglobin: 14.3 g/dL (ref 12.0–15.0)
LYMPHS ABS: 1.7 10*3/uL (ref 0.7–4.0)
Lymphocytes Relative: 45.8 % (ref 12.0–46.0)
MCHC: 33.5 g/dL (ref 30.0–36.0)
MCV: 93.3 fl (ref 78.0–100.0)
MONO ABS: 0.3 10*3/uL (ref 0.1–1.0)
MONOS PCT: 6.9 % (ref 3.0–12.0)
NEUTROS ABS: 1.5 10*3/uL (ref 1.4–7.7)
NEUTROS PCT: 40.5 % — AB (ref 43.0–77.0)
PLATELETS: 264 10*3/uL (ref 150.0–400.0)
RBC: 4.57 Mil/uL (ref 3.87–5.11)
RDW: 13.4 % (ref 11.5–15.5)
WBC: 3.8 10*3/uL — ABNORMAL LOW (ref 4.0–10.5)

## 2015-12-09 LAB — COMPREHENSIVE METABOLIC PANEL
ALT: 28 U/L (ref 0–35)
AST: 21 U/L (ref 0–37)
Albumin: 4.4 g/dL (ref 3.5–5.2)
Alkaline Phosphatase: 90 U/L (ref 39–117)
BUN: 12 mg/dL (ref 6–23)
CHLORIDE: 105 meq/L (ref 96–112)
CO2: 28 meq/L (ref 19–32)
Calcium: 10 mg/dL (ref 8.4–10.5)
Creatinine, Ser: 0.76 mg/dL (ref 0.40–1.20)
GFR: 97.81 mL/min (ref >60.00)
GLUCOSE: 118 mg/dL — AB (ref 70–99)
POTASSIUM: 4.1 meq/L (ref 3.5–5.1)
SODIUM: 142 meq/L (ref 135–145)
Total Bilirubin: 0.7 mg/dL (ref 0.2–1.2)
Total Protein: 7.6 g/dL (ref 6.0–8.3)

## 2015-12-09 LAB — LIPID PANEL
CHOL/HDL RATIO: 3
Cholesterol: 205 mg/dL — ABNORMAL HIGH (ref 0–200)
HDL: 79.9 mg/dL (ref >39.00)
LDL CALC: 107 mg/dL — AB (ref 0–99)
NonHDL: 124.84
Triglycerides: 87 mg/dL (ref 0.0–149.0)
VLDL: 17.4 mg/dL (ref 0.0–40.0)

## 2015-12-09 LAB — HEMOGLOBIN A1C: Hgb A1c MFr Bld: 6.2 % (ref 4.6–6.5)

## 2015-12-09 LAB — TSH: TSH: 5.63 u[IU]/mL — ABNORMAL HIGH (ref 0.35–4.50)

## 2015-12-09 LAB — VITAMIN D 25 HYDROXY (VIT D DEFICIENCY, FRACTURES): VITD: 50.51 ng/mL (ref 30.00–100.00)

## 2015-12-09 NOTE — Patient Instructions (Signed)
[name] , Thank you for taking time to come for your Medicare Wellness Visit. I appreciate your ongoing commitment to your health goals. Please review the following plan we discussed and let me know if I can assist you in the future.   These are the goals we discussed: Goals    . Increase physical activity          Starting 12/09/2015, I will continue to walk at least 30 min 3 days per week.        This is a list of the screening recommended for you and due dates:  Health Maintenance  Topic Date Due  . Pneumonia vaccines (2 of 2 - PPSV23) 12/08/2016  . Mammogram  08/11/2017  . Tetanus Vaccine  06/21/2020  . Colon Cancer Screening  06/10/2025  . Flu Shot  Addressed  . DEXA scan (bone density measurement)  Completed  . Shingles Vaccine  Completed  .  Hepatitis C: One time screening is recommended by Center for Disease Control  (CDC) for  adults born from [birth year] through 1965.   Completed   Preventive Care for Adults  A healthy lifestyle and preventive care can promote health and wellness. Preventive health guidelines for adults include the following key practices.  . A routine yearly physical is a good way to check with your health care provider about your health and preventive screening. It is a chance to share any concerns and updates on your health and to receive a thorough exam.  . Visit your dentist for a routine exam and preventive care every 6 months. Brush your teeth twice a day and floss once a day. Good oral hygiene prevents tooth decay and gum disease.  . The frequency of eye exams is based on your age, health, family medical history, use  of contact lenses, and other factors. Follow your health care provider's ecommendations for frequency of eye exams.  . Eat a healthy diet. Foods like vegetables, fruits, whole grains, low-fat dairy products, and lean protein foods contain the nutrients you need without too many calories. Decrease your intake of foods high in solid fats,  added sugars, and salt. Eat the right amount of calories for you. Get information about a proper diet from your health care provider, if necessary.  . Regular physical exercise is one of the most important things you can do for your health. Most adults should get at least 150 minutes of moderate-intensity exercise (any activity that increases your heart rate and causes you to sweat) each week. In addition, most adults need muscle-strengthening exercises on 2 or more days a week.  Silver Sneakers may be a benefit available to you. To determine eligibility, you may visit the website: www.silversneakers.com or contact program at [phone] Mon-Fri between 8AM-8PM.   . Maintain a healthy weight. The body mass index (BMI) is a screening tool to identify possible weight problems. It provides an estimate of body fat based on height and weight. Your health care provider can find your BMI and can help you achieve or maintain a healthy weight.   For adults 20 years and older: ? A BMI below 18.5 is considered underweight. ? A BMI of 18.5 to 24.9 is normal. ? A BMI of 25 to 29.9 is considered overweight. ? A BMI of 30 and above is considered obese.   . Maintain normal blood lipids and cholesterol levels by exercising and minimizing your intake of saturated fat. Eat a balanced diet with plenty of fruit and vegetables. Blood  tests for lipids and cholesterol should begin at age 56 and be repeated every 5 years. If your lipid or cholesterol levels are high, you are over 50, or you are at high risk for heart disease, you may need your cholesterol levels checked more frequently. Ongoing high lipid and cholesterol levels should be treated with medicines if diet and exercise are not working.  . If you smoke, find out from your health care provider how to quit. If you do not use tobacco, please do not start.  . If you choose to drink alcohol, please do not consume more than 2 drinks per day. One drink is  considered to be 12 ounces (355 mL) of beer, 5 ounces (148 mL) of wine, or 1.5 ounces (44 mL) of liquor.  . If you are 17-31 years old, ask your health care provider if you should take aspirin to prevent strokes.  . Use sunscreen. Apply sunscreen liberally and repeatedly throughout the day. You should seek shade when your shadow is shorter than you. Protect yourself by wearing long sleeves, pants, a wide-brimmed hat, and sunglasses year round, whenever you are outdoors.  . Once a month, do a whole body skin exam, using a mirror to look at the skin on your back. Tell your health care provider of new moles, moles that have irregular borders, moles that are larger than a pencil eraser, or moles that have changed in shape or color.

## 2015-12-09 NOTE — Progress Notes (Signed)
Subjective:   Jasmine RoyaltyMattie E Rollins is a 65 y.o. female who presents for Medicare Annual (Subsequent) preventive examination.  Review of Systems:  N/A Cardiac Risk Factors include: advanced age (>[id number], 54>65 women);dyslipidemia;hypertension     Objective:     Vitals: BP 112/80 (BP Location: Right Arm, Patient Position: Sitting, Cuff Size: Normal)   Pulse 72   Temp 98.5 F (36.9 C) (Oral)   Ht 5' 2.5" (1.588 m) Comment: no shoes  Wt 133 lb 12 oz (60.7 kg)   SpO2 97%   BMI 24.07 kg/m   Body mass index is 24.07 kg/m.   Tobacco History  Smoking Status  . Never Smoker  Smokeless Tobacco  . Never Used    Comment: Remote 2nd hand exposure     Counseling given: No   Past Medical History:  Diagnosis Date  . Allergic rhinitis   . Arthritis    hip  . Borderline high cholesterol   . Cataract   . Cough variant asthma   . Diverticulosis of colon   . GERD (gastroesophageal reflux disease)   . HTN (hypertension)   . Hyperglycemia    borderline DM  . OP (osteoporosis)   . Seasonal allergies   . Sleep apnea    no cpap   Past Surgical History:  Procedure Laterality Date  . COLONOSCOPY    . COLPOSCOPY    . ESOPHAGOGASTRODUODENOSCOPY  10/01   Negative  . ROTATOR CUFF REPAIR     right  . TUBAL LIGATION     Family History  Problem Relation Age of Onset  . Hypertension Father   . Diabetes Father   . Diabetes Brother   . Diabetes Brother   . Cancer Brother     prostate  . Cancer Brother     prostate  . Cancer Brother     prostate  . Cancer Brother     prostate  . Cancer Brother     prostate  . Cancer Brother     prostate  . Colon cancer Neg Hx    History  Sexual Activity  . Sexual activity: Yes    Outpatient Encounter Prescriptions as of 12/09/2015  Medication Sig  . albuterol (PROVENTIL HFA) 108 (90 Base) MCG/ACT inhaler INHALE 2 PUFFS EVERY 4 HOURS AS NEEDED (Patient taking differently: Inhale 2 puffs into the lungs every 4 (four) hours as needed for  shortness of breath. INHALE 2 PUFFS EVERY 4 HOURS AS NEEDED)  . BENICAR HCT 40-12.5 MG tablet TAKE 1 TABLET DAILY  . Calcium Carb-Cholecalciferol (CALCIUM 600+D3) 600-800 MG-UNIT TABS Take by mouth.  . fexofenadine (ALLEGRA) 180 MG tablet Take 180 mg by mouth daily.    . fluticasone (FLONASE) 50 MCG/ACT nasal spray USE 2 SPRAYS IN EACH NOSTRIL DAILY  . latanoprost (XALATAN) 0.005 % ophthalmic solution Place 1 drop into both eyes at bedtime.   . montelukast (SINGULAIR) 10 MG tablet Take 1 tablet (10 mg total) by mouth at bedtime.  . ranitidine (ZANTAC) 150 MG capsule Take 150 mg by mouth 2 (two) times daily.  . [DISCONTINUED] amoxicillin-clavulanate (AUGMENTIN) 875-125 MG tablet Take 1 tablet by mouth 2 (two) times daily. (Patient not taking: Reported on 10/21/2015)  . [DISCONTINUED] cephALEXin (KEFLEX) 500 MG capsule Take 1 capsule (500 mg total) by mouth 4 (four) times daily.  . [DISCONTINUED] phenazopyridine (PYRIDIUM) 200 MG tablet Take 1 tablet (200 mg total) by mouth 3 (three) times daily.  . [DISCONTINUED] predniSONE (DELTASONE) 10 MG tablet Take 3 pills once daily  by mouth for 3 days, then 2 pills once daily for 3 days, then 1 pill once daily for 3 days and then stop (Patient not taking: Reported on 10/21/2015)   No facility-administered encounter medications on file as of 12/09/2015.     Activities of Daily Living In your present state of health, do you have any difficulty performing the following activities: 12/09/2015  Hearing? N  Vision? N  Difficulty concentrating or making decisions? N  Walking or climbing stairs? N  Dressing or bathing? N  Doing errands, shopping? N  Preparing Food and eating ? N  Using the Toilet? N  In the past six months, have you accidently leaked urine? Y  Do you have problems with loss of bowel control? N  Managing your Medications? N  Managing your Finances? N  Housekeeping or managing your Housekeeping? N  Some recent data might be hidden     Patient Care Team: Judy Pimple, MD as PCP - General Mateo Flow, MD as Consulting Physician (Ophthalmology)    Assessment:     Hearing Screening   125Hz  250Hz  500Hz  1000Hz  2000Hz  3000Hz  4000Hz  6000Hz  8000Hz   Right ear:   40 40 40  40    Left ear:   40 40 40  40    Vision Screening Comments: Last vision exam in March 2017 with Dr. Elmer Picker   Exercise Activities and Dietary recommendations Current Exercise Habits: Home exercise routine, Type of exercise: walking, Time (Minutes): 30, Frequency (Times/Week): 3, Weekly Exercise (Minutes/Week): 90, Intensity: Mild, Exercise limited by: None identified  Goals    . Increase physical activity          Starting 12/09/2015, I will continue to walk at least 30 min 3 days per week.       Fall Risk Fall Risk  12/09/2015  Falls in the past year? No   Depression Screen PHQ 2/9 Scores 12/09/2015  PHQ - 2 Score 0     Cognitive Function MMSE - Mini Mental State Exam 12/09/2015  Orientation to time 5  Orientation to Place 5  Registration 3  Attention/ Calculation 0  Recall 3  Language- name 2 objects 0  Language- repeat 1  Language- follow 3 step command 3  Language- read & follow direction 0  Write a sentence 0  Copy design 0  Total score 20     PLEASE NOTE: A Mini-Cog screen was completed. Maximum score is 20. A value of 0 denotes this part of Folstein MMSE was not completed or the patient failed this part of the Mini-Cog screening.   Mini-Cog Screening Orientation to Time - Max 5 pts Orientation to Place - Max 5 pts Registration - Max 3 pts Recall - Max 3 pts Language Repeat - Max 1 pts Language Follow 3 Step Command - Max 3 pts     Immunization History  Administered Date(s) Administered  . Influenza Whole 11/12/2008, 11/05/2009  . Influenza, High Dose Seasonal PF 11/13/2014  . Influenza-Unspecified 11/28/2012, 11/15/2013  . Pneumococcal Conjugate-13 12/09/2015  . Pneumococcal Polysaccharide-23 03/19/2009  .  Td 05/15/2001  . Tdap 06/22/2010  . Zoster 08/05/2010   Screening Tests Health Maintenance  Topic Date Due  . PNA vac Low Risk Adult (2 of 2 - PPSV23) 12/08/2016  . MAMMOGRAM  08/11/2017  . TETANUS/TDAP  06/21/2020  . COLONOSCOPY  06/10/2025  . INFLUENZA VACCINE  Addressed  . DEXA SCAN  Completed  . ZOSTAVAX  Completed  . Hepatitis C Screening  Completed  Plan:     I have personally reviewed and addressed the Medicare Annual Wellness questionnaire and have noted the following in the patient's chart:  A. Medical and social history B. Use of alcohol, tobacco or illicit drugs  C. Current medications and supplements D. Functional ability and status E.  Nutritional status F.  Physical activity G. Advance directives H. List of other physicians I.  Hospitalizations, surgeries, and ER visits in previous 12 months J.  [place] to include hearing, vision, cognitive, depression L. Referrals and appointments - none  In addition, I have reviewed and discussed with patient certain preventive protocols, quality metrics, and best practice recommendations. A written personalized care plan for preventive services as well as general preventive health recommendations were provided to patient.  See attached scanned questionnaire for additional information.   Signed,   Lindell Noe, MHA, BS, LPN Health Coach

## 2015-12-09 NOTE — Progress Notes (Signed)
Pre visit review using our clinic review tool, if applicable. No additional management support is needed unless otherwise documented below in the visit note. 

## 2015-12-10 NOTE — Progress Notes (Signed)
PCP notes:   Health maintenance:  PCV13 - administered Flu vaccine - per pt report, vaccine given on 11/06/15  Abnormal screenings:   None  Patient concerns:   None  Nurse concerns:  None  Next PCP appt:   12/14/15 @ 0930

## 2015-12-10 NOTE — Progress Notes (Signed)
I reviewed health advisor's note, was available for consultation, and agree with documentation and plan.  

## 2015-12-14 ENCOUNTER — Ambulatory Visit (INDEPENDENT_AMBULATORY_CARE_PROVIDER_SITE_OTHER): Payer: Medicare Other | Admitting: Family Medicine

## 2015-12-14 ENCOUNTER — Encounter: Payer: Self-pay | Admitting: Family Medicine

## 2015-12-14 VITALS — BP 125/80 | HR 85 | Temp 98.2°F | Ht 62.5 in | Wt 135.5 lb

## 2015-12-14 DIAGNOSIS — I10 Essential (primary) hypertension: Secondary | ICD-10-CM | POA: Diagnosis not present

## 2015-12-14 DIAGNOSIS — E559 Vitamin D deficiency, unspecified: Secondary | ICD-10-CM

## 2015-12-14 DIAGNOSIS — M810 Age-related osteoporosis without current pathological fracture: Secondary | ICD-10-CM

## 2015-12-14 DIAGNOSIS — R946 Abnormal results of thyroid function studies: Secondary | ICD-10-CM

## 2015-12-14 DIAGNOSIS — E7800 Pure hypercholesterolemia, unspecified: Secondary | ICD-10-CM

## 2015-12-14 DIAGNOSIS — R7989 Other specified abnormal findings of blood chemistry: Secondary | ICD-10-CM | POA: Insufficient documentation

## 2015-12-14 DIAGNOSIS — J301 Allergic rhinitis due to pollen: Secondary | ICD-10-CM

## 2015-12-14 DIAGNOSIS — D72818 Other decreased white blood cell count: Secondary | ICD-10-CM

## 2015-12-14 DIAGNOSIS — R739 Hyperglycemia, unspecified: Secondary | ICD-10-CM | POA: Diagnosis not present

## 2015-12-14 LAB — T4, FREE: FREE T4: 0.65 ng/dL (ref 0.60–1.60)

## 2015-12-14 LAB — TSH: TSH: 1.9 u[IU]/mL (ref 0.35–4.50)

## 2015-12-14 MED ORDER — FLUTICASONE PROPIONATE 50 MCG/ACT NA SUSP: 2.0000 | Freq: Every day | NASAL | 3 refills | Status: DC

## 2015-12-14 MED ORDER — OLMESARTAN MEDOXOMIL-HCTZ 40-12.5 MG PO TABS: 1.0000 | ORAL_TABLET | Freq: Every day | ORAL | 3 refills | Status: DC

## 2015-12-14 MED ORDER — MONTELUKAST SODIUM 10 MG PO TABS: 10.0000 mg | ORAL_TABLET | Freq: Every day | ORAL | 3 refills | Status: DC

## 2015-12-14 NOTE — Assessment & Plan Note (Signed)
Lab Results  Component Value Date   HGBA1C 6.2 12/09/2015   Doing well with diet and exercise Urged her to continue

## 2015-12-14 NOTE — Assessment & Plan Note (Signed)
Due for 2 y dexa in 2/18  Will call for appt Disc need for calcium/ vitamin D/ wt bearing exercise and bone density test every 2 y to monitor Disc safety/ fracture risk in detail   Has taken a course of fosamax No falls or fractures

## 2015-12-14 NOTE — Progress Notes (Signed)
Subjective:    Patient ID: Jasmine Rollins, female    DOB: March 04, 1950, 65 y.o.   MRN: [id number]  HPI Here for annual f/u of chronic medical problems   Overall she is doing really well  Struggles with allergies more than anything  She has a hard time   Had AMW visit with Virl AxeLesia  She was updated on imms    Wt Readings from Last 3 Encounters:  12/14/15 135 lb 8 oz (61.5 kg)  12/09/15 133 lb 12 oz (60.7 kg)  10/20/15 133 lb (60.3 kg)  very good- she works hard to [employer] her weight (even on vacation)  bmi is 24.3  Will be due for PNA 23 in a year -had her prevnar   Mammogram 6/17-normal Self breast exam - no lumps or changes   Colonoscopy 4/17-normal with 10 y recall   dexa 2/16- OP LS -not much change  D level is 50.5 Has been on fosmax in the past  No falls or fractures  Hep C screening done   bp is stable today  No cp or palpitations or headaches or edema  No side effects to medicines  BP Readings from Last 3 Encounters:  12/14/15 130/74  12/09/15 112/80  10/21/15 140/98      Hx of hyperglycemia Lab Results  Component Value Date   HGBA1C 6.2 12/09/2015   This is down from 6.4 Keeping up the good diet -she does not want sweets anymore    Hx of hyperlipidemia Lab Results  Component Value Date   CHOL 205 (H) 12/09/2015   CHOL 212 (H) 06/08/2015   CHOL 202 (H) 12/03/2014   Lab Results  Component Value Date   HDL 79.90 12/09/2015   HDL 80.30 06/08/2015   HDL 79.90 12/03/2014   Lab Results  Component Value Date   LDLCALC 107 (H) 12/09/2015   LDLCALC 112 (H) 06/08/2015   LDLCALC 109 (H) 12/03/2014   Lab Results  Component Value Date   TRIG 87.0 12/09/2015   TRIG 96.0 06/08/2015   TRIG 68.0 12/03/2014   Lab Results  Component Value Date   CHOLHDL 3 12/09/2015   CHOLHDL 3 06/08/2015   CHOLHDL 3 12/03/2014   Lab Results  Component Value Date   LDLDIRECT 106.4 02/26/2013   LDLDIRECT 104.2 09/27/2011   LDLDIRECT 86.7 11/27/2007    Doing really  well  Eating well  Exercises when she is not traveling     Chemistry      Component Value Date/Time   NA 142 12/09/2015 0927   K 4.1 12/09/2015 0927   CL 105 12/09/2015 0927   CO2 28 12/09/2015 0927   BUN 12 12/09/2015 0927   CREATININE 0.76 12/09/2015 0927      Component Value Date/Time   CALCIUM 10.0 12/09/2015 0927   ALKPHOS 90 12/09/2015 0927   AST 21 12/09/2015 0927   ALT 28 12/09/2015 0927   BILITOT 0.7 12/09/2015 0927      Lab Results  Component Value Date   WBC 3.8 (L) 12/09/2015   HGB 14.3 12/09/2015   HCT 42.6 12/09/2015   MCV 93.3 12/09/2015   PLT 264.0 12/09/2015    TSH a little off Lab Results  Component Value Date   TSH 5.63 (H) 12/09/2015   no clinical symptoms Not tired   fam hx of goiter-brother   Patient Active Problem List   Diagnosis Date Noted  . Elevated TSH 12/14/2015  . Vitamin D deficiency 12/06/2015  . GERD (gastroesophageal reflux  disease) 06/09/2014  . Encounter for routine gynecological examination 03/03/2014  . Estrogen deficiency 03/03/2014  . Heartburn 03/01/2013  . Elevated liver enzymes 03/01/2013  . Gynecological examination 06/22/2010  . Special screening for malignant neoplasms, colon 06/22/2010  . Glaucoma suspect 06/22/2010  . Routine general medical examination at a health care facility 06/03/2010  . Sleep apnea   . Osteoporosis 05/20/2009  . BACK PAIN, LUMBAR 02/02/2009  . SLEEP APNEA 10/17/2008  . COUGH VARIANT ASTHMA 07/30/2008  . Leukocytopenia 01/29/2008  . Hyperglycemia 11/27/2007  . HYPERCHOLESTEROLEMIA 03/21/2007  . Essential hypertension 03/21/2007  . Sinusitis, chronic 03/21/2007  . Allergic rhinitis 03/21/2007  . DIVERTICULOSIS, COLON 03/21/2007   Past Medical History:  Diagnosis Date  . Allergic rhinitis   . Arthritis    hip  . Borderline high cholesterol   . Cataract   . Cough variant asthma   . Diverticulosis of colon   . GERD (gastroesophageal reflux disease)   . HTN (hypertension)   .  Hyperglycemia    borderline DM  . OP (osteoporosis)   . Seasonal allergies   . Sleep apnea    no cpap   Past Surgical History:  Procedure Laterality Date  . COLONOSCOPY    . COLPOSCOPY    . ESOPHAGOGASTRODUODENOSCOPY  10/01   Negative  . ROTATOR CUFF REPAIR     right  . TUBAL LIGATION     Social History  Substance Use Topics  . Smoking status: Never Smoker  . Smokeless tobacco: Never Used     Comment: Remote 2nd hand exposure  . Alcohol use No   Family History  Problem Relation Age of Onset  . Hypertension Father   . Diabetes Father   . Diabetes Brother   . Diabetes Brother   . Cancer Brother     prostate  . Cancer Brother     prostate  . Cancer Brother     prostate  . Cancer Brother     prostate  . Cancer Brother     prostate  . Cancer Brother     prostate  . Colon cancer Neg Hx    Allergies  Allergen Reactions  . Sulfonamide Derivatives Rash   Current Outpatient Prescriptions on File Prior to Visit  Medication Sig Dispense Refill  . albuterol (PROVENTIL HFA) 108 (90 Base) MCG/ACT inhaler INHALE 2 PUFFS EVERY 4 HOURS AS NEEDED (Patient taking differently: Inhale 2 puffs into the lungs every 4 (four) hours as needed for shortness of breath. INHALE 2 PUFFS EVERY 4 HOURS AS NEEDED) 3 Inhaler 3  . Calcium Carb-Cholecalciferol (CALCIUM 600+D3) 600-800 MG-UNIT TABS Take by mouth.    . fexofenadine (ALLEGRA) 180 MG tablet Take 180 mg by mouth daily.      Marland Kitchen latanoprost (XALATAN) 0.005 % ophthalmic solution Place 1 drop into both eyes at bedtime.     . ranitidine (ZANTAC) 150 MG capsule Take 150 mg by mouth 2 (two) times daily.     No current facility-administered medications on file prior to visit.      Review of Systems Review of Systems  Constitutional: Negative for fever, appetite change, fatigue and unexpected weight change.  Eyes: Negative for pain and visual disturbance.  ENT pos for congestion and rhinorrhea and pnd  Respiratory: Negative for cough and  shortness of breath.   Cardiovascular: Negative for cp or palpitations    Gastrointestinal: Negative for nausea, diarrhea and constipation.  Genitourinary: Negative for urgency and frequency.  Skin: Negative for pallor or rash  Neurological: Negative for weakness, light-headedness, numbness and headaches.  Hematological: Negative for adenopathy. Does not bruise/bleed easily.  Psychiatric/Behavioral: Negative for dysphoric mood. The patient is not nervous/anxious.         Objective:   Physical Exam  Constitutional: She appears well-developed and well-nourished. No distress.  Well appearing   HENT:  Head: Normocephalic and atraumatic.  Right Ear: External ear normal.  Left Ear: External ear normal.  Mouth/Throat: Oropharynx is clear and moist.  Eyes: Conjunctivae and EOM are normal. Pupils are equal, round, and reactive to light. No scleral icterus.  Neck: Normal range of motion. Neck supple. No JVD present. Carotid bruit is not present. No thyromegaly present.  Cardiovascular: Normal rate, regular rhythm, normal heart sounds and intact distal pulses.  Exam reveals no gallop.   Pulmonary/Chest: Effort normal and breath sounds normal. No respiratory distress. She has no wheezes. She exhibits no tenderness.  Abdominal: Soft. Bowel sounds are normal. She exhibits no distension, no abdominal bruit and no mass. There is no tenderness.  Genitourinary: No breast swelling, tenderness, discharge or bleeding.  Genitourinary Comments: Breast exam: No mass, nodules, thickening, tenderness, bulging, retraction, inflamation, nipple discharge or skin changes noted.  No axillary or clavicular LA.      Musculoskeletal: Normal range of motion. She exhibits no edema or tenderness.  Lymphadenopathy:    She has no cervical adenopathy.  Neurological: She is alert. She has normal reflexes. No cranial nerve deficit. She exhibits normal muscle tone. Coordination normal.  Skin: Skin is warm and dry. No rash  noted. No erythema. No pallor.  Stable brown nevi and skin tags-trunk and neck  Psychiatric: She has a normal mood and affect.          Assessment & Plan:   Problem List Items Addressed This Visit      Cardiovascular and Mediastinum   Essential hypertension - Primary    bp in fair control at this time  BP Readings from Last 1 Encounters:  12/14/15 125/80   No changes needed Disc lifstyle change with low sodium diet and exercise  Labs reviewed        Relevant Medications   olmesartan-hydrochlorothiazide (BENICAR HCT) 40-12.5 MG tablet     Respiratory   Allergic rhinitis    Enc her to change her antihistamine to zyrtec or claritin Continue other meds Consider return to allergist if no improvement         Musculoskeletal and Integument   Osteoporosis    Due for 2 y dexa in 2/18  Will call for appt Disc need for calcium/ vitamin D/ wt bearing exercise and bone density test every 2 y to monitor Disc safety/ fracture risk in detail   Has taken a course of fosamax No falls or fractures         Other   Elevated TSH    Check TSH again- slt elevated with free T4 Clinically stable  No goiter         Relevant Orders   TSH (Completed)   T4, Free (Completed)   HYPERCHOLESTEROLEMIA    Disc goals for lipids and reasons to control them Rev labs with pt Rev low sat fat diet in detail       Relevant Medications   olmesartan-hydrochlorothiazide (BENICAR HCT) 40-12.5 MG tablet   Hyperglycemia    Lab Results  Component Value Date   HGBA1C 6.2 12/09/2015   Doing well with diet and exercise Urged her to continue       Leukocytopenia  No significant change- continue to follow       Vitamin D deficiency    Vitamin D level is therapeutic with current supplementation Disc importance of this to bone and overall health        Other Visit Diagnoses   None.

## 2015-12-14 NOTE — Assessment & Plan Note (Signed)
Enc her to change her antihistamine to zyrtec or claritin Continue other meds Consider return to allergist if no improvement

## 2015-12-14 NOTE — Assessment & Plan Note (Signed)
Vitamin D level is therapeutic with current supplementation Disc importance of this to bone and overall health  

## 2015-12-14 NOTE — Assessment & Plan Note (Signed)
No significant change- continue to follow

## 2015-12-14 NOTE — Assessment & Plan Note (Signed)
Disc goals for lipids and reasons to control them Rev labs with pt Rev low sat fat diet in detail   

## 2015-12-14 NOTE — Progress Notes (Signed)
Pre visit review using our clinic review tool, if applicable. No additional management support is needed unless otherwise documented below in the visit note. 

## 2015-12-14 NOTE — Assessment & Plan Note (Addendum)
Check TSH again- slt elevated with free T4 Clinically stable  No goiter

## 2015-12-14 NOTE — Patient Instructions (Addendum)
Change to a different antihistamine (zyrtec/claritin/xysal)  You can have a bone density test after 2/18 - let us know when you want to do it  Re checking thyroid tests today - we will update you with result and plan

## 2015-12-14 NOTE — Assessment & Plan Note (Signed)
bp in fair control at this time  BP Readings from Last 1 Encounters:  12/14/15 125/80   No changes needed Disc lifstyle change with low sodium diet and exercise  Labs reviewed

## 2015-12-22 ENCOUNTER — Telehealth: Payer: Self-pay | Admitting: Family Medicine

## 2015-12-22 NOTE — Telephone Encounter (Signed)
Pt requesting call back regarding labs  Please call [phone] Thanks

## 2015-12-22 NOTE — Telephone Encounter (Signed)
Pt notified lab results were released on mychart, I gave her their # so she can reset her account. Pt notified of Dr. Royden Purlower's comments office of mychart and verbalized understanding

## 2016-01-04 ENCOUNTER — Other Ambulatory Visit: Payer: Self-pay | Admitting: Family Medicine

## 2016-02-17 DIAGNOSIS — H40033 Anatomical narrow angle, bilateral: Secondary | ICD-10-CM | POA: Diagnosis not present

## 2016-02-17 DIAGNOSIS — H401131 Primary open-angle glaucoma, bilateral, mild stage: Secondary | ICD-10-CM | POA: Diagnosis not present

## 2016-02-17 DIAGNOSIS — H2513 Age-related nuclear cataract, bilateral: Secondary | ICD-10-CM | POA: Diagnosis not present

## 2016-02-17 DIAGNOSIS — H25013 Cortical age-related cataract, bilateral: Secondary | ICD-10-CM | POA: Diagnosis not present

## 2016-04-11 ENCOUNTER — Telehealth: Payer: Self-pay | Admitting: Family Medicine

## 2016-04-11 ENCOUNTER — Encounter (HOSPITAL_COMMUNITY): Payer: Self-pay | Admitting: Family Medicine

## 2016-04-11 ENCOUNTER — Ambulatory Visit (INDEPENDENT_AMBULATORY_CARE_PROVIDER_SITE_OTHER): Payer: Medicare Other

## 2016-04-11 ENCOUNTER — Ambulatory Visit (HOSPITAL_COMMUNITY)
Admission: EM | Admit: 2016-04-11 | Discharge: 2016-04-11 | Disposition: A | Discharge status: Home / Self Care | Payer: Medicare Other | Attending: Family Medicine | Admitting: Family Medicine

## 2016-04-11 DIAGNOSIS — R0603 Acute respiratory distress: Secondary | ICD-10-CM

## 2016-04-11 DIAGNOSIS — J00 Acute nasopharyngitis [common cold]: Secondary | ICD-10-CM | POA: Diagnosis not present

## 2016-04-11 DIAGNOSIS — R0982 Postnasal drip: Secondary | ICD-10-CM

## 2016-04-11 DIAGNOSIS — R938 Abnormal findings on diagnostic imaging of other specified body structures: Secondary | ICD-10-CM | POA: Diagnosis not present

## 2016-04-11 DIAGNOSIS — R9389 Abnormal findings on diagnostic imaging of other specified body structures: Secondary | ICD-10-CM

## 2016-04-11 DIAGNOSIS — R05 Cough: Secondary | ICD-10-CM | POA: Diagnosis not present

## 2016-04-11 MED ORDER — CEFDINIR 300 MG PO CAPS: 300.0000 mg | ORAL_CAPSULE | Freq: Two times a day (BID) | ORAL | 0 refills | Status: DC

## 2016-04-11 MED ORDER — AZITHROMYCIN 250 MG PO TABS: ORAL_TABLET | ORAL | 0 refills | Status: DC

## 2016-04-11 NOTE — Telephone Encounter (Signed)
Team healthcalled Py was given an ed disposition for sob, chest pains and refused to go. She wants to be worked into an appt today.   Team health will be sending over report

## 2016-04-11 NOTE — Telephone Encounter (Signed)
Patient Name: Jasmine DeterMATTIE Bruski  DOB: 01/15/1951    Initial Comment Caller states she's short of breath, pain in her hands and arms. Headache, weakness.   Nurse Assessment  Nurse: Scarlette ArStandifer, RN, Heather Date/Time (Eastern Time): 04/11/2016 8:59:07 AM  Confirm and document reason for call. If symptomatic, describe symptoms. ---Caller states she's short of breath, pain in her hands and arms on Saturday night that is gone now. Headache, weakness. cough congestion  Does the patient have any new or worsening symptoms? ---Yes  Will a triage be completed? ---Yes  Related visit to physician within the last 2 weeks? ---No  Does the PT have any chronic conditions? (i.e. diabetes, asthma, etc.) ---Yes  List chronic conditions. ---See MR  Is this a behavioral health or substance abuse call? ---No     Guidelines    Guideline Title Affirmed Question Affirmed Notes  Cough - Acute Productive Difficulty breathing    Final Disposition User   Go to ED Now Standifer, RN, [employer]Heather    Comments  Caller states that she does not want to go to the ED, she wants to be seen at the office.  Called the office, [phone] and information given.   Referrals  GO TO FACILITY REFUSED   Disagree/Comply: Disagree  Disagree/Comply Reason: Disagree with instructions

## 2016-04-11 NOTE — Telephone Encounter (Signed)
I think all the slots I opened are filled? If not- first avail please

## 2016-04-11 NOTE — Discharge Instructions (Signed)
Allegra or Zyrtec daily as needed for drainage and runny nose. Saline nasal spray used frequently. Ibuprofen 200 mg every 6 hours as needed for pain, discomfort or fever. Drink plenty of fluids and stay well-hydrated. Take your antibiotics as directed See your doctor in 2 to 3 weeks, sooner for problems

## 2016-04-11 NOTE — ED Provider Notes (Signed)
CSN: [id number]     Arrival date & time 04/11/16  1022 History   First MD Initiated Contact with Patient 04/11/16 1244     Chief Complaint  Patient presents with  . Cough  . Nasal Congestion   (Consider location/radiation/quality/duration/timing/severity/associated sxs/prior Treatment) 66 year old female complaining of shortness of breath for the past 3-4 days. Also complaining of head congestion and decreased energy. She has a history of reactive airway disease where occasionally she will have wheezing when she gets sick. Also complaining of PND. Not taking any medications for symptoms. She states 2 days ago she had a fever of 100. None since.      Past Medical History:  Diagnosis Date  . Allergic rhinitis   . Arthritis    hip  . Borderline high cholesterol   . Cataract   . Cough variant asthma   . Diverticulosis of colon   . GERD (gastroesophageal reflux disease)   . HTN (hypertension)   . Hyperglycemia    borderline DM  . OP (osteoporosis)   . Seasonal allergies   . Sleep apnea    no cpap   Past Surgical History:  Procedure Laterality Date  . COLONOSCOPY    . COLPOSCOPY    . ESOPHAGOGASTRODUODENOSCOPY  10/01   Negative  . ROTATOR CUFF REPAIR     right  . TUBAL LIGATION     Family History  Problem Relation Age of Onset  . Hypertension Father   . Diabetes Father   . Diabetes Brother   . Diabetes Brother   . Cancer Brother     prostate  . Cancer Brother     prostate  . Cancer Brother     prostate  . Cancer Brother     prostate  . Cancer Brother     prostate  . Cancer Brother     prostate  . Colon cancer Neg Hx    Social History  Substance Use Topics  . Smoking status: Never Smoker  . Smokeless tobacco: Never Used     Comment: Remote 2nd hand exposure  . Alcohol use No   OB History    No data available     Review of Systems  Constitutional: Positive for activity change, fatigue and fever. Negative for appetite change and chills.  HENT:  Positive for congestion and postnasal drip. Negative for facial swelling and rhinorrhea.   Eyes: Negative.   Respiratory: Positive for cough and shortness of breath.   Cardiovascular: Negative.   Gastrointestinal: Negative.   Musculoskeletal: Negative for neck pain and neck stiffness.  Skin: Negative for pallor and rash.  Neurological: Negative.     Allergies  Sulfonamide derivatives  Home Medications   Prior to Admission medications   Medication Sig Start Date End Date Taking? Authorizing Provider  albuterol (PROVENTIL HFA) 108 (90 Base) MCG/ACT inhaler INHALE 2 PUFFS EVERY 4 HOURS AS NEEDED Patient taking differently: Inhale 2 puffs into the lungs every 4 (four) hours as needed for shortness of breath. INHALE 2 PUFFS EVERY 4 HOURS AS NEEDED 04/29/15   Judy Pimple, MD  azithromycin (ZITHROMAX) 250 MG tablet 2 tabs po on day one, then one tablet po once daily on days 2-5. 04/11/16   Hayden Rasmussen, NP  Calcium Carb-Cholecalciferol (CALCIUM 600+D3) 600-800 MG-UNIT TABS Take by mouth.    Historical Provider, MD  cefdinir (OMNICEF) 300 MG capsule Take 1 capsule (300 mg total) by mouth 2 (two) times daily. 04/11/16   Hayden Rasmussen, NP  fexofenadine [facility]) 180  MG tablet Take 180 mg by mouth daily.      Historical Provider, MD  fluticasone (FLONASE) 50 MCG/ACT nasal spray Place 2 sprays into both nostrils daily. 12/14/15   Audrie GallusMarne A Tower, MD  latanoprost (XALATAN) 0.005 % ophthalmic solution Place 1 drop into both eyes at bedtime.  04/16/15   Historical Provider, MD  montelukast (SINGULAIR) 10 MG tablet Take 1 tablet (10 mg total) by mouth at bedtime. 12/14/15   Judy PimpleMarne A Tower, MD  olmesartan-hydrochlorothiazide (BENICAR HCT) 40-12.5 MG tablet Take 1 tablet by mouth daily. 12/14/15   Judy PimpleMarne A Tower, MD  ranitidine (ZANTAC) 150 MG tablet TAKE 1 TABLET TWICE A DAY 01/04/16   Judy PimpleMarne A Tower, MD   Meds Ordered and Administered this Visit  Medications - No data to display  BP 131/78   Pulse 91   Temp  98.6 F (37 C) (Oral)   Resp 18   SpO2 95%  No data found.   Physical Exam  Constitutional: She is oriented to person, place, and time. She appears well-developed and well-nourished. No distress.  HENT:  Head: Normocephalic and atraumatic.  Mouth/Throat: No oropharyngeal exudate.  Bilateral TMs are normal. Oropharynx with minor erythema and small amount of clear PND. No swelling or exudate.  Eyes: EOM are normal.  Neck: Normal range of motion. Neck supple.  Cardiovascular: Normal rate, regular rhythm and normal heart sounds.   Pulmonary/Chest: Effort normal and breath sounds normal. No respiratory distress. She has no wheezes. She has no rales. She exhibits no tenderness.  Musculoskeletal: Normal range of motion. She exhibits no edema.  Lymphadenopathy:    She has no cervical adenopathy.  Neurological: She is alert and oriented to person, place, and time.  Skin: Skin is warm and dry. No rash noted.  Psychiatric: She has a normal mood and affect.  Nursing note and vitals reviewed.   Urgent Care Course     Procedures (including critical care time)  Labs Review Labs Reviewed - No data to display  Imaging Review Dg Chest 2 View  Result Date: 04/11/2016 CLINICAL DATA:  Intermittently productive cough, dyspnea, and chest congestion for the past 4 days associated with wheezing. History of asthma EXAM: CHEST  2 VIEW COMPARISON:  Chest x-ray of September 16, 2008 FINDINGS: The lungs are mildly hypoinflated. There is subtle increased density at the left lung base. The cardiac silhouette is top-normal in size. The pulmonary vascularity is normal. There is no pleural effusion. The mediastinum is normal in width. IMPRESSION: Left basilar atelectasis or early pneumonia. The findings are accentuated by mild hypoinflation. Followup PA and lateral chest X-ray is recommended in 3-4 weeks following trial of antibiotic therapy to ensure resolution and exclude underlying malignancy. Electronically Signed    By: Navya Timmons  [place] M.D.   On: 04/11/2016 13:15     Visual Acuity Review  Right Eye Distance:   Left Eye Distance:   Bilateral Distance:    Right Eye Near:   Left Eye Near:    Bilateral Near:         MDM   1. Other acute rhinitis   2. PND (post-nasal drip)   3. Acute respiratory distress   4. Abnormal finding on chest xray    Allegra or Zyrtec daily as needed for drainage and runny nose. Saline nasal spray used frequently. Ibuprofen 200 mg every 6 hours as needed for pain, discomfort or fever. Drink plenty of fluids and stay well-hydrated. Take your antibiotics as directed See your doctor in 2  to 3 weeks, sooner for problems Meds ordered this encounter  Medications  . cefdinir (OMNICEF) 300 MG capsule    Sig: Take 1 capsule (300 mg total) by mouth 2 (two) times daily.    Dispense:  14 capsule    Refill:  0    Order Specific Question:   Supervising Provider    Answer:   Linna Hoff [phone]  . azithromycin (ZITHROMAX) 250 MG tablet    Sig: 2 tabs po on day one, then one tablet po once daily on days 2-5.    Dispense:  6 tablet    Refill:  0    Order Specific Question:   Supervising Provider    Answer:   Linna Hoff [[id number]]  Chest x-ray reading atelectasis versus early pneumonia. We will go ahead and treat with antibiotics as above. Patient also aware of 2 lesions mentioned in the x-ray which need to be reevaluated in 3 weeks with a repeat chest x-ray.     Hayden Rasmussen, NP 04/11/16 1342    Hayden Rasmussen, NP 04/11/16 1344

## 2016-04-11 NOTE — ED Triage Notes (Signed)
Pt here for cough, SOB, weakness, congestion.

## 2016-04-11 NOTE — Telephone Encounter (Signed)
Pt did go to the ER as she advised Rena earlier today

## 2016-04-11 NOTE — Telephone Encounter (Signed)
I [facility] with pt [facility] or [facility] and [facility] but pt did not want to go there and pt [facility].

## 2016-04-27 ENCOUNTER — Encounter: Payer: Self-pay | Admitting: Family Medicine

## 2016-04-27 ENCOUNTER — Ambulatory Visit (INDEPENDENT_AMBULATORY_CARE_PROVIDER_SITE_OTHER): Payer: Medicare Other | Admitting: Family Medicine

## 2016-04-27 ENCOUNTER — Ambulatory Visit (INDEPENDENT_AMBULATORY_CARE_PROVIDER_SITE_OTHER)
Admission: RE | Admit: 2016-04-27 | Discharge: 2016-04-27 | Disposition: A | Discharge status: Home / Self Care | Payer: Medicare Other | Source: Ambulatory Visit | Attending: Family Medicine | Admitting: Family Medicine

## 2016-04-27 VITALS — BP 128/82 | HR 78 | Temp 98.3°F | Ht 62.25 in | Wt 138.2 lb

## 2016-04-27 DIAGNOSIS — Z0389 Encounter for observation for other suspected diseases and conditions ruled out: Secondary | ICD-10-CM | POA: Diagnosis not present

## 2016-04-27 DIAGNOSIS — J189 Pneumonia, unspecified organism: Secondary | ICD-10-CM

## 2016-04-27 NOTE — Progress Notes (Signed)
Subjective:    Patient ID: Jasmine Rollins, female    DOB: [birth year], 66 y.o.   MRN: [id number]  HPI Here for f/u from UC visit for pneumonia  She was seen 04/11/16 for uri symptoms by Hayden Rasmussen NP Hx of temp to 100 deg tx with omnicef and azithromycin    Dg Chest 2 View  Result Date: 04/11/2016 CLINICAL DATA:  Intermittently productive cough, dyspnea, and chest congestion for the past 4 days associated with wheezing. History of asthma EXAM: CHEST  2 VIEW COMPARISON:  Chest x-ray of September 16, 2008 FINDINGS: The lungs are mildly hypoinflated. There is subtle increased density at the left lung base. The cardiac silhouette is top-normal in size. The pulmonary vascularity is normal. There is no pleural effusion. The mediastinum is normal in width. IMPRESSION: Left basilar atelectasis or early pneumonia. The findings are accentuated by mild hypoinflation. Followup PA and lateral chest X-ray is recommended in 3-4 weeks following trial of antibiotic therapy to ensure resolution and exclude underlying malignancy. Electronically Signed   By: David  [place] M.D.   On: 04/11/2016 13:15     Pulse ox is 96% on RA today Feeling much better now  Still having some cough- especially if she laughs  Some production- no longer with color  No more temp  Still a little fatigue  Some nasal symptoms- she uses a netti pot and that helps  Also flonase   Patient Active Problem List   Diagnosis Date Noted  . CAP (community acquired pneumonia) 04/27/2016  . Elevated TSH 12/14/2015  . Vitamin D deficiency 12/06/2015  . GERD (gastroesophageal reflux disease) 06/09/2014  . Encounter for routine gynecological examination 03/03/2014  . Estrogen deficiency 03/03/2014  . Heartburn 03/01/2013  . Elevated liver enzymes 03/01/2013  . Gynecological examination 06/22/2010  . Special screening for malignant neoplasms, colon 06/22/2010  . Glaucoma suspect 06/22/2010  . Routine general medical examination at a health  care facility 06/03/2010  . Sleep apnea   . Osteoporosis 05/20/2009  . BACK PAIN, LUMBAR 02/02/2009  . SLEEP APNEA 10/17/2008  . COUGH VARIANT ASTHMA 07/30/2008  . Leukocytopenia 01/29/2008  . Hyperglycemia 11/27/2007  . HYPERCHOLESTEROLEMIA 03/21/2007  . Essential hypertension 03/21/2007  . Sinusitis, chronic 03/21/2007  . Allergic rhinitis 03/21/2007  . DIVERTICULOSIS, COLON 03/21/2007   Past Medical History:  Diagnosis Date  . Allergic rhinitis   . Arthritis    hip  . Borderline high cholesterol   . Cataract   . Cough variant asthma   . Diverticulosis of colon   . GERD (gastroesophageal reflux disease)   . HTN (hypertension)   . Hyperglycemia    borderline DM  . OP (osteoporosis)   . Seasonal allergies   . Sleep apnea    no cpap   Past Surgical History:  Procedure Laterality Date  . COLONOSCOPY    . COLPOSCOPY    . ESOPHAGOGASTRODUODENOSCOPY  10/01   Negative  . ROTATOR CUFF REPAIR     right  . TUBAL LIGATION     Social History  Substance Use Topics  . Smoking status: Never Smoker  . Smokeless tobacco: Never Used     Comment: Remote 2nd hand exposure  . Alcohol use No   Family History  Problem Relation Age of Onset  . Hypertension Father   . Diabetes Father   . Diabetes Brother   . Diabetes Brother   . Cancer Brother     prostate  . Cancer Brother     prostate  .  Cancer Brother     prostate  . Cancer Brother     prostate  . Cancer Brother     prostate  . Cancer Brother     prostate  . Colon cancer Neg Hx    Allergies  Allergen Reactions  . Sulfonamide Derivatives Rash   Current Outpatient Prescriptions on File Prior to Visit  Medication Sig Dispense Refill  . albuterol (PROVENTIL HFA) 108 (90 Base) MCG/ACT inhaler INHALE 2 PUFFS EVERY 4 HOURS AS NEEDED (Patient taking differently: Inhale 2 puffs into the lungs every 4 (four) hours as needed for shortness of breath. INHALE 2 PUFFS EVERY 4 HOURS AS NEEDED) 3 Inhaler 3  . Calcium  Carb-Cholecalciferol (CALCIUM 600+D3) 600-800 MG-UNIT TABS Take by mouth.    . fexofenadine (ALLEGRA) 180 MG tablet Take 180 mg by mouth daily.      . fluticasone (FLONASE) 50 MCG/ACT nasal spray Place 2 sprays into both nostrils daily. 48 g 3  . latanoprost (XALATAN) 0.005 % ophthalmic solution Place 1 drop into both eyes at bedtime.     . montelukast (SINGULAIR) 10 MG tablet Take 1 tablet (10 mg total) by mouth at bedtime. 90 tablet 3  . olmesartan-hydrochlorothiazide (BENICAR HCT) 40-12.5 MG tablet Take 1 tablet by mouth daily. 90 tablet 3  . ranitidine (ZANTAC) 150 MG tablet TAKE 1 TABLET TWICE A DAY 180 tablet 1   No current facility-administered medications on file prior to visit.      Review of Systems Review of Systems  Constitutional: Negative for fever, appetite change, and unexpected weight change.  ENT pos for baseline nasal symptoms/ allergies  Eyes: Negative for pain and visual disturbance.  Respiratory: Negative for wheeze and shortness of breath.  pos for cough that is much imp Cardiovascular: Negative for cp or palpitations    Gastrointestinal: Negative for nausea, diarrhea and constipation.  Genitourinary: Negative for urgency and frequency.  Skin: Negative for pallor or rash   Neurological: Negative for weakness, light-headedness, numbness and headaches.  Hematological: Negative for adenopathy. Does not bruise/bleed easily.  Psychiatric/Behavioral: Negative for dysphoric mood. The patient is not nervous/anxious.         Objective:   Physical Exam  Constitutional: She appears well-developed and well-nourished. No distress.  HENT:  Head: Normocephalic and atraumatic.  Right Ear: External ear normal.  Left Ear: External ear normal.  Mouth/Throat: Oropharynx is clear and moist.  Mild clear rhinorrhea and pnd  Throat is clear   Eyes: Conjunctivae and EOM are normal. Pupils are equal, round, and reactive to light. Right eye exhibits no discharge. Left eye exhibits no  discharge.  Neck: Normal range of motion. Neck supple.  Cardiovascular: Normal rate and regular rhythm.   Pulmonary/Chest: Effort normal and breath sounds normal. No respiratory distress. She has no wheezes. She has no rales. She exhibits no tenderness.  Good air exch No rales or rhonchi  No wheeze even on forced exp  No prolonged exp phase   Lymphadenopathy:    She has no cervical adenopathy.  Skin: Skin is warm and dry.  1/2 cm brown dry appearing area of hyperpig (? Nevus) R inguinal area-pt shows this at end of visit  No central clearing or satellite lesions  Psychiatric: She has a normal mood and affect.          Assessment & Plan:   Problem List Items Addressed This Visit      Respiratory   CAP (community acquired pneumonia)    Much clinical improvement after  omnicef and azithromycin  Re check cxr today (there was ? Of atelectasis vs infiltrate) Well appearing with re assuring exam today  She will continue allergy meds as pollen season approaches  Albuterol only prn -no wheeze today  Update if symptoms worsen or do not continue to improve       Relevant Orders   DG Chest 2 View

## 2016-04-27 NOTE — Progress Notes (Signed)
Pre visit review using our clinic review tool, if applicable. No additional management support is needed unless otherwise documented below in the visit note. 

## 2016-04-27 NOTE — Patient Instructions (Signed)
I'm glad you are feeling better  Continue allergy medicines If symptoms of cough worsen or new symptoms please alert me  Take care of yourself  Chest xray today

## 2016-04-27 NOTE — Assessment & Plan Note (Addendum)
Much clinical improvement after omnicef and azithromycin  Reviewed UC records, lab results and studies in detail   Re check cxr today (there was ? Of atelectasis vs infiltrate) Well appearing with re assuring exam today  She will continue allergy meds as pollen season approaches  Albuterol only prn -no wheeze today  Update if symptoms worsen or do not continue to improve

## 2016-06-28 ENCOUNTER — Ambulatory Visit (INDEPENDENT_AMBULATORY_CARE_PROVIDER_SITE_OTHER): Payer: Medicare Other | Admitting: Internal Medicine

## 2016-06-28 ENCOUNTER — Encounter: Payer: Self-pay | Admitting: Internal Medicine

## 2016-06-28 ENCOUNTER — Ambulatory Visit (INDEPENDENT_AMBULATORY_CARE_PROVIDER_SITE_OTHER)
Admission: RE | Admit: 2016-06-28 | Discharge: 2016-06-28 | Disposition: A | Discharge status: Home / Self Care | Payer: Medicare Other | Source: Ambulatory Visit | Attending: Internal Medicine | Admitting: Internal Medicine

## 2016-06-28 DIAGNOSIS — J45991 Cough variant asthma: Secondary | ICD-10-CM

## 2016-06-28 DIAGNOSIS — R05 Cough: Secondary | ICD-10-CM | POA: Diagnosis not present

## 2016-06-28 MED ORDER — PREDNISONE 20 MG PO TABS: 40.0000 mg | ORAL_TABLET | Freq: Every day | ORAL | 0 refills | Status: DC

## 2016-06-28 NOTE — Patient Instructions (Signed)
Let me know later in the week if you are worsening.

## 2016-06-28 NOTE — Progress Notes (Signed)
Subjective:    Patient ID: Jasmine Rollins, female    DOB: 07/20/1950, 66 y.o.   MRN: [id number]  HPI Here due to persistent allergy symptoms Runny nose, congestion, headache-- over a week (after being out in the trees) Now with cough since yesterday Dizziness--but this seems better Trying Neti pot--some help  No SOB--has been using the inhaler and it helps singulair and zyrtec regularly mucinex helped at first No fever No night sweats or chills Cough sounds like bronchitis  Current Outpatient Prescriptions on File Prior to Visit  Medication Sig Dispense Refill  . albuterol (PROVENTIL HFA) 108 (90 Base) MCG/ACT inhaler INHALE 2 PUFFS EVERY 4 HOURS AS NEEDED (Patient taking differently: Inhale 2 puffs into the lungs every 4 (four) hours as needed for shortness of breath. INHALE 2 PUFFS EVERY 4 HOURS AS NEEDED) 3 Inhaler 3  . Calcium Carb-Cholecalciferol (CALCIUM 600+D3) 600-800 MG-UNIT TABS Take by mouth.    . fluticasone (FLONASE) 50 MCG/ACT nasal spray Place 2 sprays into both nostrils daily. 48 g 3  . latanoprost (XALATAN) 0.005 % ophthalmic solution Place 1 drop into both eyes at bedtime.     . montelukast (SINGULAIR) 10 MG tablet Take 1 tablet (10 mg total) by mouth at bedtime. 90 tablet 3  . olmesartan-hydrochlorothiazide (BENICAR HCT) 40-12.5 MG tablet Take 1 tablet by mouth daily. 90 tablet 3  . ranitidine (ZANTAC) 150 MG tablet TAKE 1 TABLET TWICE A DAY 180 tablet 1   No current facility-administered medications on file prior to visit.     Allergies  Allergen Reactions  . Sulfonamide Derivatives Rash    Past Medical History:  Diagnosis Date  . Allergic rhinitis   . Arthritis    hip  . Borderline high cholesterol   . Cataract   . Cough variant asthma   . Diverticulosis of colon   . GERD (gastroesophageal reflux disease)   . HTN (hypertension)   . Hyperglycemia    borderline DM  . OP (osteoporosis)   . Seasonal allergies   . Sleep apnea    no cpap    Past  Surgical History:  Procedure Laterality Date  . COLONOSCOPY    . COLPOSCOPY    . ESOPHAGOGASTRODUODENOSCOPY  10/01   Negative  . ROTATOR CUFF REPAIR     right  . TUBAL LIGATION      Family History  Problem Relation Age of Onset  . Hypertension Father   . Diabetes Father   . Diabetes Brother   . Diabetes Brother   . Cancer Brother        prostate  . Cancer Brother        prostate  . Cancer Brother        prostate  . Cancer Brother        prostate  . Cancer Brother        prostate  . Cancer Brother        prostate  . Colon cancer Neg Hx     Social History   Social History  . Marital status: Married    Spouse name: N/A  . Number of children: 1  . Years of education: N/A   Occupational History  . Customer Service    Social History Main Topics  . Smoking status: Never Smoker  . Smokeless tobacco: Never Used     Comment: Remote 2nd hand exposure  . Alcohol use No  . Drug use: No  . Sexual activity: Yes   Other Topics Concern  .  Not on file   Social History Narrative  . No narrative on file   Review of Systems  No rash No vomiting or diarrhea Appetite is okay     Objective:   Physical Exam  Constitutional: She appears well-nourished. No distress.  HENT:  Mouth/Throat: Oropharynx is clear and moist. No oropharyngeal exudate.  Mild maxillary tenderness TMs normal Mod nasal inflammation  Neck: No thyromegaly present.  Pulmonary/Chest: No respiratory distress. She has no wheezes. She has no rales.  Tight cough with deep breath  Lymphadenopathy:    She has no cervical adenopathy.          Assessment & Plan:

## 2016-06-28 NOTE — Assessment & Plan Note (Addendum)
Doesn't seem to be clear cut infection Tight sounding cough CXR looks fine--- no change from March Will treat with 5 days of prednisone Continue the current regimen---be regular with montelukast Consider antibiotic later in the week if worsening

## 2016-06-30 ENCOUNTER — Telehealth: Payer: Self-pay | Admitting: *Deleted

## 2016-06-30 MED ORDER — DOXYCYCLINE HYCLATE 100 MG PO TABS: 100.0000 mg | ORAL_TABLET | Freq: Two times a day (BID) | ORAL | 0 refills | Status: DC

## 2016-06-30 NOTE — Telephone Encounter (Signed)
Please let her know that I sent the prescription 

## 2016-06-30 NOTE — Telephone Encounter (Signed)
Patient left a voicemail stating that she was in Tuesday and diagnosed with asthma. Patient stated that she was given a steroid. Patient stated that her cough has gotten deeper and she feels that she needs an antibiotic sent in.   Pharmacy [facility]

## 2016-06-30 NOTE — Telephone Encounter (Signed)
Spoke to pt

## 2016-07-02 ENCOUNTER — Other Ambulatory Visit: Payer: Self-pay | Admitting: Family Medicine

## 2016-08-15 ENCOUNTER — Other Ambulatory Visit: Payer: Self-pay | Admitting: Family Medicine

## 2016-08-15 DIAGNOSIS — Z1231 Encounter for screening mammogram for malignant neoplasm of breast: Secondary | ICD-10-CM

## 2016-09-12 ENCOUNTER — Ambulatory Visit
Admission: RE | Admit: 2016-09-12 | Discharge: 2016-09-12 | Disposition: A | Discharge status: Home / Self Care | Payer: Medicare Other | Source: Ambulatory Visit | Attending: Family Medicine | Admitting: Family Medicine

## 2016-09-12 DIAGNOSIS — Z1231 Encounter for screening mammogram for malignant neoplasm of breast: Secondary | ICD-10-CM | POA: Diagnosis not present

## 2016-09-13 ENCOUNTER — Encounter: Payer: Self-pay | Admitting: Family Medicine

## 2016-09-13 ENCOUNTER — Ambulatory Visit (INDEPENDENT_AMBULATORY_CARE_PROVIDER_SITE_OTHER): Payer: Medicare Other | Admitting: Family Medicine

## 2016-09-13 VITALS — BP 140/80 | HR 80 | Temp 98.0°F | Ht 62.25 in | Wt 137.5 lb

## 2016-09-13 DIAGNOSIS — J45991 Cough variant asthma: Secondary | ICD-10-CM | POA: Diagnosis not present

## 2016-09-13 MED ORDER — FLUTICASONE PROPIONATE HFA 44 MCG/ACT IN AERO: 2.0000 | INHALATION_SPRAY | Freq: Two times a day (BID) | RESPIRATORY_TRACT | 11 refills | Status: DC

## 2016-09-13 NOTE — Patient Instructions (Signed)
Start flovent inhaler 2 puffs twice daily on a schedule Use albuterol inhaler only when needed   Get zyrtec 10 mg over the counter and take one daily  Continue other medicines   If no improvement in a month let us know   Fever or other symptoms- also let us know

## 2016-09-13 NOTE — Progress Notes (Signed)
Subjective:    Patient ID: Jasmine Rollins, female    DOB: [birth year], 66 y.o.   MRN: [id number]  HPI Here for cough  66 yo female with hx of allergies and cough variant asthma   Thinks her asthma is acting up  Last week she went to the park after a rain  Started coughing/hacking  Some congestion   Feels like a deep cough  Prod of lots of clear mucous   Worse at night -lying down  During the day if she laughs she coughs and also going outdoors   Albuterol mdi-using every 4 hours (helps)  On flonase On singulair   Takes zantac   In the past she used asthmanex - then ins stopped covering it  Interested in trying a different steroid inhaler   Patient Active Problem List   Diagnosis Date Noted  . Elevated TSH 12/14/2015  . Vitamin D deficiency 12/06/2015  . GERD (gastroesophageal reflux disease) 06/09/2014  . Encounter for routine gynecological examination 03/03/2014  . Estrogen deficiency 03/03/2014  . Heartburn 03/01/2013  . Elevated liver enzymes 03/01/2013  . Gynecological examination 06/22/2010  . Special screening for malignant neoplasms, colon 06/22/2010  . Glaucoma suspect 06/22/2010  . Routine general medical examination at a health care facility 06/03/2010  . Sleep apnea   . Osteoporosis 05/20/2009  . BACK PAIN, LUMBAR 02/02/2009  . SLEEP APNEA 10/17/2008  . COUGH VARIANT ASTHMA 07/30/2008  . Leukocytopenia 01/29/2008  . Hyperglycemia 11/27/2007  . HYPERCHOLESTEROLEMIA 03/21/2007  . Essential hypertension 03/21/2007  . Sinusitis, chronic 03/21/2007  . Allergic rhinitis 03/21/2007  . DIVERTICULOSIS, COLON 03/21/2007   Past Medical History:  Diagnosis Date  . Allergic rhinitis   . Arthritis    hip  . Borderline high cholesterol   . Cataract   . Cough variant asthma   . Diverticulosis of colon   . GERD (gastroesophageal reflux disease)   . HTN (hypertension)   . Hyperglycemia    borderline DM  . OP (osteoporosis)   . Seasonal allergies   .  Sleep apnea    no cpap   Past Surgical History:  Procedure Laterality Date  . COLONOSCOPY    . COLPOSCOPY    . ESOPHAGOGASTRODUODENOSCOPY  10/01   Negative  . ROTATOR CUFF REPAIR     right  . TUBAL LIGATION     Social History  Substance Use Topics  . Smoking status: Never Smoker  . Smokeless tobacco: Never Used     Comment: Remote 2nd hand exposure  . Alcohol use No   Family History  Problem Relation Age of Onset  . Hypertension Father   . Diabetes Father   . Diabetes Brother   . Diabetes Brother   . Cancer Brother        prostate  . Cancer Brother        prostate  . Cancer Brother        prostate  . Cancer Brother        prostate  . Cancer Brother        prostate  . Cancer Brother        prostate  . Colon cancer Neg Hx    Allergies  Allergen Reactions  . Sulfonamide Derivatives Rash   Current Outpatient Prescriptions on File Prior to Visit  Medication Sig Dispense Refill  . albuterol (PROVENTIL HFA) 108 (90 Base) MCG/ACT inhaler INHALE 2 PUFFS EVERY 4 HOURS AS NEEDED (Patient taking differently: Inhale 2 puffs into the lungs every  4 (four) hours as needed for shortness of breath. INHALE 2 PUFFS EVERY 4 HOURS AS NEEDED) 3 Inhaler 3  . Calcium Carb-Cholecalciferol (CALCIUM 600+D3) 600-800 MG-UNIT TABS Take by mouth.    . cetirizine (ZYRTEC) 10 MG tablet Take 10 mg by mouth daily.    . fluticasone (FLONASE) 50 MCG/ACT nasal spray Place 2 sprays into both nostrils daily. 48 g 3  . latanoprost (XALATAN) 0.005 % ophthalmic solution Place 1 drop into both eyes at bedtime.     . montelukast (SINGULAIR) 10 MG tablet Take 1 tablet (10 mg total) by mouth at bedtime. 90 tablet 3  . olmesartan-hydrochlorothiazide (BENICAR HCT) 40-12.5 MG tablet Take 1 tablet by mouth daily. 90 tablet 3  . ranitidine (ZANTAC) 150 MG tablet TAKE 1 TABLET TWICE A DAY 180 tablet 0   No current facility-administered medications on file prior to visit.     Review of Systems Review of Systems    Constitutional: Negative for fever, appetite change, fatigue and unexpected weight change.  Eyes: Negative for pain and visual disturbance.  Respiratory: Negative for wheeze  and shortness of breath.   Cardiovascular: Negative for cp or palpitations    Gastrointestinal: Negative for nausea, diarrhea and constipation.  Genitourinary: Negative for urgency and frequency.  Skin: Negative for pallor or rash   Neurological: Negative for weakness, light-headedness, numbness and headaches.  Hematological: Negative for adenopathy. Does not bruise/bleed easily.  Psychiatric/Behavioral: Negative for dysphoric mood. The patient is not nervous/anxious.         Objective:   Physical Exam  Constitutional: She appears well-developed and well-nourished. No distress.  Well appearing   HENT:  Head: Normocephalic and atraumatic.  Right Ear: External ear normal.  Left Ear: External ear normal.  Mouth/Throat: Oropharynx is clear and moist. No oropharyngeal exudate.  Nares are injected and congested  No sinus tenderness  Eyes: Pupils are equal, round, and reactive to light. Conjunctivae and EOM are normal. Right eye exhibits no discharge. Left eye exhibits no discharge. No scleral icterus.  Neck: Normal range of motion. Neck supple.  Cardiovascular: Normal rate, regular rhythm and normal heart sounds.   Pulmonary/Chest: Effort normal and breath sounds normal. No respiratory distress. She has no wheezes. She has no rales. She exhibits no tenderness.  Harsh bs with good air exch  Cough is hacking (barky) in nature  Scant wheeze only on forced exp  No prolonged exp phase No rales or rhonchi   Lymphadenopathy:    She has no cervical adenopathy.  Neurological: She is alert.  Skin: Skin is warm and dry. No rash noted. No erythema. No pallor.  Psychiatric: She has a normal mood and affect.          Assessment & Plan:   Problem List Items Addressed This Visit      Respiratory   COUGH VARIANT  ASTHMA - Primary    Worsened lately with season and weather  Did well on steroid inhaler in the past - will try flovent 44 mcg 2 puffs bid (can titrate up in a mo if needed)  Pharmacy will call if not covered  Continue flonase Add back zyrtec Continue singulaire  Albuterol prn  Allergen avoidance  Update if not starting to improve in several weeks or if worsening  (esp if fever or new symptoms)      Relevant Medications   fluticasone (FLOVENT HFA) 44 MCG/ACT inhaler

## 2016-09-13 NOTE — Assessment & Plan Note (Addendum)
Worsened lately with season and weather  Did well on steroid inhaler in the past - will try flovent 44 mcg 2 puffs bid (can titrate up in a mo if needed)  Pharmacy will call if not covered  Continue flonase Add back zyrtec Continue singulaire  Albuterol prn  Allergen avoidance  Update if not starting to improve in several weeks or if worsening  (esp if fever or new symptoms)

## 2016-09-22 ENCOUNTER — Telehealth: Payer: Self-pay

## 2016-09-22 MED ORDER — PREDNISONE 10 MG PO TABS: ORAL_TABLET | ORAL | 0 refills | Status: DC

## 2016-09-22 NOTE — Telephone Encounter (Signed)
I sent it  F/u if no improvement  

## 2016-09-22 NOTE — Telephone Encounter (Signed)
Left voicemail letting pt know Rx sent to pharmacy and to f/u if no improvement  

## 2016-09-22 NOTE — Telephone Encounter (Signed)
Pt left v/m; pt was seen on 09/13/16; pt continues with cough, no improvement with cough and at times cough seems worse;no fever and no color to mucus. Pt using flovent. Pt said Dr Milinda Antisower had mentioned prednisone at [facility]. [facility] to [facility] Pt request cb.

## 2016-09-26 DIAGNOSIS — H40033 Anatomical narrow angle, bilateral: Secondary | ICD-10-CM | POA: Diagnosis not present

## 2016-09-26 DIAGNOSIS — H401131 Primary open-angle glaucoma, bilateral, mild stage: Secondary | ICD-10-CM | POA: Diagnosis not present

## 2016-09-30 ENCOUNTER — Other Ambulatory Visit: Payer: Self-pay | Admitting: Family Medicine

## 2016-09-30 NOTE — Telephone Encounter (Signed)
Will refill electronically  

## 2016-09-30 NOTE — Telephone Encounter (Signed)
Pt's had a few recent acute appts but no recent f/u or CPE, please advise

## 2016-10-04 MED ORDER — FLUTICASONE PROPIONATE HFA 44 MCG/ACT IN AERO: 2.0000 | INHALATION_SPRAY | Freq: Two times a day (BID) | RESPIRATORY_TRACT | 1 refills | Status: DC

## 2016-10-04 NOTE — Addendum Note (Signed)
Addended by: Shon Millet on: 10/04/2016 10:42 AM   Modules accepted: Orders

## 2016-11-21 ENCOUNTER — Other Ambulatory Visit: Payer: Self-pay | Admitting: Family Medicine

## 2016-11-22 NOTE — Telephone Encounter (Signed)
Please refill for 6 mo 

## 2016-11-22 NOTE — Telephone Encounter (Signed)
done

## 2016-11-22 NOTE — Telephone Encounter (Signed)
No recent or future appts with Dr. Milinda Antis, please advise

## 2016-12-08 ENCOUNTER — Other Ambulatory Visit: Payer: Self-pay | Admitting: Family Medicine

## 2016-12-08 NOTE — Telephone Encounter (Signed)
Pt's had a recent acute appt but no recent f/u or CPE, please advise

## 2016-12-08 NOTE — Telephone Encounter (Signed)
Please schedule f/u and refill until then   Early winter is fine

## 2016-12-08 NOTE — Telephone Encounter (Signed)
appt scheduled and med refilled 

## 2017-01-11 ENCOUNTER — Encounter: Payer: Self-pay | Admitting: Internal Medicine

## 2017-01-11 ENCOUNTER — Ambulatory Visit (INDEPENDENT_AMBULATORY_CARE_PROVIDER_SITE_OTHER): Payer: Medicare Other | Admitting: Internal Medicine

## 2017-01-11 VITALS — BP 138/88 | HR 82 | Temp 98.2°F | Resp 12 | Wt 136.8 lb

## 2017-01-11 DIAGNOSIS — J329 Chronic sinusitis, unspecified: Secondary | ICD-10-CM | POA: Diagnosis not present

## 2017-01-11 DIAGNOSIS — J45991 Cough variant asthma: Secondary | ICD-10-CM | POA: Diagnosis not present

## 2017-01-11 MED ORDER — PREDNISONE 20 MG PO TABS: 40.0000 mg | ORAL_TABLET | Freq: Every day | ORAL | 0 refills | Status: DC

## 2017-01-11 MED ORDER — AMOXICILLIN 500 MG PO TABS: 1000.0000 mg | ORAL_TABLET | Freq: Two times a day (BID) | ORAL | 0 refills | Status: AC

## 2017-01-11 NOTE — Assessment & Plan Note (Signed)
Not really exacerbated with ongoing Rx Will give 3 days prednisone for sinuses and to relieve inhaler need

## 2017-01-11 NOTE — Progress Notes (Signed)
Subjective:    Patient ID: Jasmine Rollins, female    DOB: 1950-07-06, 66 y.o.   MRN: [id number]  HPI Here due to respiratory symptoms  Has reoccuring symptoms Cold, rainy weather brings on congestion and cough Happens every year Started about a week ago Head congestion, post nasal drip causing sore throat last week (better now) Uses Neti pot--no help  Uses the claritin, flonase and montelukast daily  Low grade fever at most in past few days Purulent nasal mucus in AM Some headache this morning Slight SOB--not much No wheezing  Has been using the proair regularly flovent daily also No wheezing Also using mucinex multi symptom  Current Outpatient Medications on File Prior to Visit  Medication Sig Dispense Refill  . albuterol (PROVENTIL HFA) 108 (90 Base) MCG/ACT inhaler INHALE 2 PUFFS EVERY 4 HOURS AS NEEDED (Patient taking differently: Inhale 2 puffs into the lungs every 4 (four) hours as needed for shortness of breath. INHALE 2 PUFFS EVERY 4 HOURS AS NEEDED) 3 Inhaler 3  . Calcium Carb-Cholecalciferol (CALCIUM 600+D3) 600-800 MG-UNIT TABS Take by mouth.    . fluticasone (FLONASE) 50 MCG/ACT nasal spray Place 2 sprays into both nostrils daily. 48 g 3  . fluticasone (FLOVENT HFA) 44 MCG/ACT inhaler Inhale 2 puffs into the lungs 2 (two) times daily. Rinse mouth after use 3 Inhaler 1  . latanoprost (XALATAN) 0.005 % ophthalmic solution Place 1 drop into both eyes at bedtime.     Marland Kitchen. loratadine (CLARITIN) 10 MG tablet Take 10 mg by mouth daily.    . montelukast (SINGULAIR) 10 MG tablet TAKE 1 TABLET AT BEDTIME 90 tablet 1  . olmesartan-hydrochlorothiazide (BENICAR HCT) 40-12.5 MG tablet TAKE 1 TABLET DAILY 90 tablet 0  . ranitidine (ZANTAC) 150 MG tablet TAKE 1 TABLET TWICE A DAY 180 tablet 3   No current facility-administered medications on file prior to visit.     Allergies  Allergen Reactions  . Sulfonamide Derivatives Rash    Past Medical History:  Diagnosis Date  .  Allergic rhinitis   . Arthritis    hip  . Borderline high cholesterol   . Cataract   . Cough variant asthma   . Diverticulosis of colon   . GERD (gastroesophageal reflux disease)   . HTN (hypertension)   . Hyperglycemia    borderline DM  . OP (osteoporosis)   . Seasonal allergies   . Sleep apnea    no cpap    Past Surgical History:  Procedure Laterality Date  . COLONOSCOPY    . COLPOSCOPY    . ESOPHAGOGASTRODUODENOSCOPY  10/01   Negative  . ROTATOR CUFF REPAIR     right  . TUBAL LIGATION      Family History  Problem Relation Age of Onset  . Hypertension Father   . Diabetes Father   . Diabetes Brother   . Diabetes Brother   . Cancer Brother        prostate  . Cancer Brother        prostate  . Cancer Brother        prostate  . Cancer Brother        prostate  . Cancer Brother        prostate  . Cancer Brother        prostate  . Colon cancer Neg Hx     Social History   Socioeconomic History  . Marital status: Married    Spouse name: Not on file  . Number of  children: 1  . Years of education: Not on file  . Highest education level: Not on file  Social Needs  . Financial resource strain: Not on file  . Food insecurity - worry: Not on file  . Food insecurity - inability: Not on file  . Transportation needs - medical: Not on file  . Transportation needs - non-medical: Not on file  Occupational History  . Occupation: Clinical biochemistCustomer Service  Tobacco Use  . Smoking status: Never Smoker  . Smokeless tobacco: Never Used  . Tobacco comment: Remote 2nd hand exposure  Substance and Sexual Activity  . Alcohol use: No    Alcohol/week: 0.0 oz  . Drug use: No  . Sexual activity: Yes  Other Topics Concern  . Not on file  Social History Narrative  . Not on file   Review of Systems No rash No vomiting or diarrhea Appetite okay--?off some    Objective:   Physical Exam  Constitutional: She appears well-nourished. No distress.  HENT:  Mouth/Throat: Oropharynx  is clear and moist. No oropharyngeal exudate.  Mild frontal tenderness Moderate nasal inflammation TMs normal   Neck: No thyromegaly present.  Pulmonary/Chest: Effort normal and breath sounds normal. No respiratory distress. She has no wheezes. She has no rales.  Lymphadenopathy:    She has no cervical adenopathy.          Assessment & Plan:

## 2017-01-11 NOTE — Assessment & Plan Note (Signed)
With exacerbation but not clearly bacterial Continue symptom relief If worsens, start the amoxicillin

## 2017-01-22 ENCOUNTER — Telehealth: Payer: Self-pay | Admitting: Family Medicine

## 2017-01-22 DIAGNOSIS — R7989 Other specified abnormal findings of blood chemistry: Secondary | ICD-10-CM

## 2017-01-22 DIAGNOSIS — I10 Essential (primary) hypertension: Secondary | ICD-10-CM

## 2017-01-22 DIAGNOSIS — E7800 Pure hypercholesterolemia, unspecified: Secondary | ICD-10-CM

## 2017-01-22 DIAGNOSIS — E559 Vitamin D deficiency, unspecified: Secondary | ICD-10-CM

## 2017-01-22 DIAGNOSIS — R739 Hyperglycemia, unspecified: Secondary | ICD-10-CM

## 2017-01-22 NOTE — Telephone Encounter (Signed)
-----   Message from Dixie DialsAraceli Valencia, [place] sent at 01/19/2017  8:28 AM EST ----- Regarding: LABS Patient has a lab appointment on 01/23/17, can you please put in labs. Thank you.

## 2017-01-23 ENCOUNTER — Other Ambulatory Visit: Payer: Medicare Other

## 2017-01-25 ENCOUNTER — Encounter: Payer: Self-pay | Admitting: Family Medicine

## 2017-01-25 ENCOUNTER — Ambulatory Visit (INDEPENDENT_AMBULATORY_CARE_PROVIDER_SITE_OTHER): Payer: Medicare Other | Admitting: Family Medicine

## 2017-01-25 VITALS — BP 130/84 | HR 83 | Temp 98.4°F | Ht 62.5 in | Wt 136.2 lb

## 2017-01-25 DIAGNOSIS — R7989 Other specified abnormal findings of blood chemistry: Secondary | ICD-10-CM | POA: Diagnosis not present

## 2017-01-25 DIAGNOSIS — Z23 Encounter for immunization: Secondary | ICD-10-CM

## 2017-01-25 DIAGNOSIS — R739 Hyperglycemia, unspecified: Secondary | ICD-10-CM

## 2017-01-25 DIAGNOSIS — E7800 Pure hypercholesterolemia, unspecified: Secondary | ICD-10-CM

## 2017-01-25 DIAGNOSIS — E559 Vitamin D deficiency, unspecified: Secondary | ICD-10-CM | POA: Diagnosis not present

## 2017-01-25 DIAGNOSIS — M810 Age-related osteoporosis without current pathological fracture: Secondary | ICD-10-CM

## 2017-01-25 DIAGNOSIS — D72818 Other decreased white blood cell count: Secondary | ICD-10-CM | POA: Diagnosis not present

## 2017-01-25 DIAGNOSIS — I10 Essential (primary) hypertension: Secondary | ICD-10-CM | POA: Diagnosis not present

## 2017-01-25 LAB — CBC WITH DIFFERENTIAL/PLATELET
BASOS ABS: 0 10*3/uL (ref 0.0–0.1)
Basophils Relative: 0.6 % (ref 0.0–3.0)
EOS ABS: 0.1 10*3/uL (ref 0.0–0.7)
Eosinophils Relative: 3.1 % (ref 0.0–5.0)
HCT: 40.6 % (ref 36.0–46.0)
Hemoglobin: 13.7 g/dL (ref 12.0–15.0)
Lymphocytes Relative: 27.4 % (ref 12.0–46.0)
Lymphs Abs: 1.1 10*3/uL (ref 0.7–4.0)
MCHC: 33.6 g/dL (ref 30.0–36.0)
MCV: 95.3 fl (ref 78.0–100.0)
MONOS PCT: 8.1 % (ref 3.0–12.0)
Monocytes Absolute: 0.3 10*3/uL (ref 0.1–1.0)
NEUTROS ABS: 2.5 10*3/uL (ref 1.4–7.7)
NEUTROS PCT: 60.8 % (ref 43.0–77.0)
Platelets: 260 10*3/uL (ref 150.0–400.0)
RBC: 4.26 Mil/uL (ref 3.87–5.11)
RDW: 12.9 % (ref 11.5–15.5)
WBC: 4.1 10*3/uL (ref 4.0–10.5)

## 2017-01-25 LAB — COMPREHENSIVE METABOLIC PANEL
ALBUMIN: 4.4 g/dL (ref 3.5–5.2)
ALT: 74 U/L — AB (ref 0–35)
AST: 40 U/L — ABNORMAL HIGH (ref 0–37)
Alkaline Phosphatase: 109 U/L (ref 39–117)
BILIRUBIN TOTAL: 0.7 mg/dL (ref 0.2–1.2)
BUN: 9 mg/dL (ref 6–23)
CO2: 31 meq/L (ref 19–32)
CREATININE: 0.64 mg/dL (ref 0.40–1.20)
Calcium: 9.8 mg/dL (ref 8.4–10.5)
Chloride: 103 mEq/L (ref 96–112)
GFR: 118.86 mL/min (ref >60.00)
Glucose, Bld: 106 mg/dL — ABNORMAL HIGH (ref 70–99)
Potassium: 4.5 mEq/L (ref 3.5–5.1)
Sodium: 141 mEq/L (ref 135–145)
TOTAL PROTEIN: 7.8 g/dL (ref 6.0–8.3)

## 2017-01-25 LAB — LIPID PANEL
CHOLESTEROL: 198 mg/dL (ref 0–200)
HDL: 75.5 mg/dL (ref >39.00)
LDL CALC: 107 mg/dL — AB (ref 0–99)
NonHDL: 122.16
Total CHOL/HDL Ratio: 3
Triglycerides: 77 mg/dL (ref 0.0–149.0)
VLDL: 15.4 mg/dL (ref 0.0–40.0)

## 2017-01-25 LAB — HEMOGLOBIN A1C: HEMOGLOBIN A1C: 6.6 % — AB (ref 4.6–6.5)

## 2017-01-25 LAB — T4, FREE: Free T4: 0.75 ng/dL (ref 0.60–1.60)

## 2017-01-25 LAB — VITAMIN D 25 HYDROXY (VIT D DEFICIENCY, FRACTURES): VITD: 47.1 ng/mL (ref 30.00–100.00)

## 2017-01-25 LAB — TSH: TSH: 2.14 u[IU]/mL (ref 0.35–4.50)

## 2017-01-25 MED ORDER — OLMESARTAN MEDOXOMIL-HCTZ 40-12.5 MG PO TABS: 1.0000 | ORAL_TABLET | Freq: Every day | ORAL | 3 refills | Status: DC

## 2017-01-25 MED ORDER — FLUTICASONE PROPIONATE 50 MCG/ACT NA SUSP: 2.0000 | Freq: Every day | NASAL | 3 refills | Status: DC

## 2017-01-25 MED ORDER — MONTELUKAST SODIUM 10 MG PO TABS: 10.0000 mg | ORAL_TABLET | Freq: Every day | ORAL | 3 refills | Status: DC

## 2017-01-25 MED ORDER — ALBUTEROL SULFATE HFA 108 (90 BASE) MCG/ACT IN AERS: INHALATION_SPRAY | RESPIRATORY_TRACT | 3 refills | Status: DC

## 2017-01-25 NOTE — Progress Notes (Signed)
Subjective:    Patient ID: Jasmine RoyaltyMattie E Rollins, female    DOB: [birth year], 66 y.o.   MRN: [id number]  HPI Here for annual follow up for chronic medical problems  In general feeling ok   Allergies /asthma /chronic sinusitis  Caught something around thanksgiving (saw Dr Kandra NicolasLetvak)-gave her prednisone  She had to take abx a week later  Still coughing up clear mucous   Due for pna 23 - she will get today   Mammogram 7/18 -nl  No lumps on self exam   Colonoscopy 4/17 - up to date 10 years   imms utd   Interested in shingrix    Wt Readings from Last 3 Encounters:  01/25/17 136 lb 4 oz (61.8 kg)  01/11/17 136 lb 12 oz (62 kg)  09/13/16 137 lb 8 oz (62.4 kg)  continues to mt weight  24.52 kg/m   Has not had amw visit   Has not had her labs drawn yet due to weather /closings Will have them today   bp is stable today  No cp or palpitations or headaches or edema  No side effects to medicines  BP Readings from Last 3 Encounters:  01/25/17 130/84  01/11/17 138/88  09/13/16 140/80     OP= LS  Feb 2016 dexa  She had one fall at church-twisted ankle and did not break anything Declines a dexa  Vit D level due   H/o elevated tsh Lab Results  Component Value Date   TSH 1.90 12/14/2015   Nl last check  Needs re check today  Hyperlipidemia  Lab Results  Component Value Date   CHOL 205 (H) 12/09/2015   HDL 79.90 12/09/2015   LDLCALC 107 (H) 12/09/2015   LDLDIRECT 106.4 02/26/2013   TRIG 87.0 12/09/2015   CHOLHDL 3 12/09/2015  has been watching her diet carefully  Infrequently a taste of fried foods  Lab planned today   H/o hyperglycemia Lab Results  Component Value Date   HGBA1C 6.2 12/09/2015  she has pretty much given up bread For lab today  H/o low wbc Lab Results  Component Value Date   WBC 3.8 (L) 12/09/2015   HGB 14.3 12/09/2015   HCT 42.6 12/09/2015   MCV 93.3 12/09/2015   PLT 264.0 12/09/2015  will check today   Patient Active Problem List   Diagnosis Date Noted  . Elevated TSH 12/14/2015  . Vitamin D deficiency 12/06/2015  . GERD (gastroesophageal reflux disease) 06/09/2014  . Encounter for routine gynecological examination 03/03/2014  . Estrogen deficiency 03/03/2014  . Heartburn 03/01/2013  . Elevated liver enzymes 03/01/2013  . Gynecological examination 06/22/2010  . Special screening for malignant neoplasms, colon 06/22/2010  . Glaucoma suspect 06/22/2010  . Routine general medical examination at a health care facility 06/03/2010  . Sleep apnea   . Osteoporosis 05/20/2009  . BACK PAIN, LUMBAR 02/02/2009  . SLEEP APNEA 10/17/2008  . COUGH VARIANT ASTHMA 07/30/2008  . Leukocytopenia 01/29/2008  . Hyperglycemia 11/27/2007  . HYPERCHOLESTEROLEMIA 03/21/2007  . Essential hypertension 03/21/2007  . Sinusitis, chronic 03/21/2007  . Allergic rhinitis 03/21/2007  . DIVERTICULOSIS, COLON 03/21/2007   Past Medical History:  Diagnosis Date  . Allergic rhinitis   . Arthritis    hip  . Borderline high cholesterol   . Cataract   . Cough variant asthma   . Diverticulosis of colon   . GERD (gastroesophageal reflux disease)   . HTN (hypertension)   . Hyperglycemia    borderline DM  . OP (  osteoporosis)   . Seasonal allergies   . Sleep apnea    no cpap   Past Surgical History:  Procedure Laterality Date  . COLONOSCOPY    . COLPOSCOPY    . ESOPHAGOGASTRODUODENOSCOPY  10/01   Negative  . ROTATOR CUFF REPAIR     right  . TUBAL LIGATION     Social History   Tobacco Use  . Smoking status: Never Smoker  . Smokeless tobacco: Never Used  . Tobacco comment: Remote 2nd hand exposure  Substance Use Topics  . Alcohol use: No    Alcohol/week: 0.0 oz  . Drug use: No   Family History  Problem Relation Age of Onset  . Hypertension Father   . Diabetes Father   . Diabetes Brother   . Diabetes Brother   . Cancer Brother        prostate  . Cancer Brother        prostate  . Cancer Brother        prostate  . Cancer  Brother        prostate  . Cancer Brother        prostate  . Cancer Brother        prostate  . Colon cancer Neg Hx    Allergies  Allergen Reactions  . Sulfonamide Derivatives Rash   Current Outpatient Medications on File Prior to Visit  Medication Sig Dispense Refill  . Calcium Carb-Cholecalciferol (CALCIUM 600+D3) 600-800 MG-UNIT TABS Take by mouth.    . fluticasone (FLOVENT HFA) 44 MCG/ACT inhaler Inhale 2 puffs into the lungs 2 (two) times daily. Rinse mouth after use 3 Inhaler 1  . latanoprost (XALATAN) 0.005 % ophthalmic solution Place 1 drop into both eyes at bedtime.     Marland Kitchen loratadine (CLARITIN) 10 MG tablet Take 10 mg by mouth daily.    . ranitidine (ZANTAC) 150 MG tablet TAKE 1 TABLET TWICE A DAY 180 tablet 3   No current facility-administered medications on file prior to visit.     Review of Systems  Constitutional: Negative for activity change, appetite change, fatigue, fever and unexpected weight change.  HENT: Positive for rhinorrhea. Negative for congestion, sore throat and trouble swallowing.   Eyes: Negative for pain, redness, itching and visual disturbance.  Respiratory: Positive for cough. Negative for chest tightness, shortness of breath and wheezing.        Mild cough s/p uri  Cardiovascular: Negative for chest pain and palpitations.  Gastrointestinal: Negative for abdominal pain, blood in stool, constipation, diarrhea and nausea.  Endocrine: Negative for cold intolerance, heat intolerance, polydipsia and polyuria.  Genitourinary: Negative for difficulty urinating, dysuria, frequency and urgency.  Musculoskeletal: Negative for arthralgias, joint swelling and myalgias.  Skin: Negative for pallor and rash.  Neurological: Negative for dizziness, tremors, weakness, numbness and headaches.  Hematological: Negative for adenopathy. Does not bruise/bleed easily.  Psychiatric/Behavioral: Negative for decreased concentration and dysphoric mood. The patient is not  nervous/anxious.        Objective:   Physical Exam  Constitutional: She appears well-developed and well-nourished. No distress.  Well appearing   HENT:  Head: Normocephalic and atraumatic.  Right Ear: External ear normal.  Left Ear: External ear normal.  Mouth/Throat: Oropharynx is clear and moist.  Nares are mildly congested Scant blood in L nare  No sinus tenderness  Eyes: Conjunctivae and EOM are normal. Pupils are equal, round, and reactive to light. No scleral icterus.  Neck: Normal range of motion. Neck supple. No JVD present. Carotid  bruit is not present. No thyromegaly present.  Cardiovascular: Normal rate, regular rhythm, normal heart sounds and intact distal pulses. Exam reveals no gallop.  Pulmonary/Chest: Effort normal and breath sounds normal. No respiratory distress. She has no wheezes. She has no rales. She exhibits no tenderness.  No wheeze even on forced exp  Abdominal: Soft. Bowel sounds are normal. She exhibits no distension, no abdominal bruit and no mass. There is no tenderness.  Genitourinary: No breast swelling, tenderness, discharge or bleeding.  Genitourinary Comments: Breast exam: No mass, nodules, thickening, tenderness, bulging, retraction, inflamation, nipple discharge or skin changes noted.  No axillary or clavicular LA.      Musculoskeletal: Normal range of motion. She exhibits no edema or tenderness.  Lymphadenopathy:    She has no cervical adenopathy.  Neurological: She is alert. She has normal reflexes. No cranial nerve deficit. She exhibits normal muscle tone. Coordination normal.  Skin: Skin is warm and dry. No rash noted. No erythema. No pallor.  Few brown nevi-back/arms  Psychiatric: She has a normal mood and affect.          Assessment & Plan:   Problem List Items Addressed This Visit      Cardiovascular and Mediastinum   Essential hypertension - Primary    bp in fair control at this time  BP Readings from Last 1 Encounters:    01/25/17 130/84   No changes needed Disc lifstyle change with low sodium diet and exercise  benicar hct refilled  Labs today      Relevant Medications   olmesartan-hydrochlorothiazide (BENICAR HCT) 40-12.5 MG tablet     Musculoskeletal and Integument   Osteoporosis    dexa 2/16 Declines another  One fall/ no fractures  On ca and D          Other   Elevated TSH    Last one normalized No clinical changes Labs today      HYPERCHOLESTEROLEMIA    Disc goals for lipids and reasons to control them Rev labs with pt  (last check) Labs today- watching diet  Rev low sat fat diet in detail       Relevant Medications   olmesartan-hydrochlorothiazide (BENICAR HCT) 40-12.5 MG tablet   Hyperglycemia    A1C today  Pt gave up bread /avoids excessive carbs disc imp of low glycemic diet and wt loss to prevent DM2       Leukocytopenia    Cbc today  Mild /asymptomatic in past      Vitamin D deficiency    With h/o OP Level today  Takes ca and D  Disc imp to bone and overall health       Other Visit Diagnoses    Need for 23-polyvalent pneumococcal polysaccharide vaccine       Relevant Orders   Pneumococcal polysaccharide vaccine 23-valent greater than or equal to 2yo subcutaneous/IM (Completed)

## 2017-01-25 NOTE — Assessment & Plan Note (Signed)
Disc goals for lipids and reasons to control them Rev labs with pt  (last check) Labs today- watching diet  Rev low sat fat diet in detail

## 2017-01-25 NOTE — Assessment & Plan Note (Signed)
bp in fair control at this time  BP Readings from Last 1 Encounters:  01/25/17 130/84   No changes needed Disc lifstyle change with low sodium diet and exercise  benicar hct refilled  Labs today

## 2017-01-25 NOTE — Assessment & Plan Note (Signed)
dexa 2/16 Declines another  One fall/ no fractures  On ca and D

## 2017-01-25 NOTE — Patient Instructions (Addendum)
If you are interested in Shingrix vaccine - call your pharmacy   Pneumonia vaccine today   Labs today   Take care of yourself!   Let me know when you next want a bone density test  Get back to exercise when you feel better

## 2017-01-25 NOTE — Assessment & Plan Note (Signed)
A1C today  Pt gave up bread /avoids excessive carbs disc imp of low glycemic diet and wt loss to prevent DM2

## 2017-01-25 NOTE — Assessment & Plan Note (Signed)
Last one normalized No clinical changes Labs today

## 2017-01-25 NOTE — Assessment & Plan Note (Signed)
With h/o OP Level today  Takes ca and D  Disc imp to bone and overall health

## 2017-01-25 NOTE — Assessment & Plan Note (Signed)
Cbc today  Mild /asymptomatic in past

## 2017-03-08 ENCOUNTER — Other Ambulatory Visit: Payer: Self-pay | Admitting: Family Medicine

## 2017-03-15 ENCOUNTER — Other Ambulatory Visit: Payer: Self-pay | Admitting: Family Medicine

## 2017-03-23 ENCOUNTER — Encounter: Payer: Self-pay | Admitting: Family Medicine

## 2017-03-23 DIAGNOSIS — H2513 Age-related nuclear cataract, bilateral: Secondary | ICD-10-CM | POA: Diagnosis not present

## 2017-03-23 DIAGNOSIS — H25013 Cortical age-related cataract, bilateral: Secondary | ICD-10-CM | POA: Diagnosis not present

## 2017-03-23 DIAGNOSIS — H401131 Primary open-angle glaucoma, bilateral, mild stage: Secondary | ICD-10-CM | POA: Diagnosis not present

## 2017-03-23 DIAGNOSIS — H40033 Anatomical narrow angle, bilateral: Secondary | ICD-10-CM | POA: Diagnosis not present

## 2017-04-16 ENCOUNTER — Telehealth: Payer: Self-pay | Admitting: Family Medicine

## 2017-04-16 DIAGNOSIS — I10 Essential (primary) hypertension: Secondary | ICD-10-CM

## 2017-04-16 DIAGNOSIS — E559 Vitamin D deficiency, unspecified: Secondary | ICD-10-CM

## 2017-04-16 DIAGNOSIS — M810 Age-related osteoporosis without current pathological fracture: Secondary | ICD-10-CM

## 2017-04-16 DIAGNOSIS — R739 Hyperglycemia, unspecified: Secondary | ICD-10-CM

## 2017-04-16 DIAGNOSIS — R7989 Other specified abnormal findings of blood chemistry: Secondary | ICD-10-CM

## 2017-04-16 DIAGNOSIS — E7800 Pure hypercholesterolemia, unspecified: Secondary | ICD-10-CM

## 2017-04-16 NOTE — Telephone Encounter (Signed)
-----   Message from Alvina Chouerri J Walsh sent at 04/13/2017  3:38 PM EST ----- Regarding: Lab orders for Friday, 3.8.19 Patient is scheduled for CPX labs, please order future labs, Thanks , Camelia Engerri

## 2017-04-21 ENCOUNTER — Other Ambulatory Visit (INDEPENDENT_AMBULATORY_CARE_PROVIDER_SITE_OTHER): Payer: Medicare Other

## 2017-04-21 DIAGNOSIS — I10 Essential (primary) hypertension: Secondary | ICD-10-CM

## 2017-04-21 DIAGNOSIS — M810 Age-related osteoporosis without current pathological fracture: Secondary | ICD-10-CM

## 2017-04-21 DIAGNOSIS — R739 Hyperglycemia, unspecified: Secondary | ICD-10-CM

## 2017-04-21 DIAGNOSIS — E559 Vitamin D deficiency, unspecified: Secondary | ICD-10-CM

## 2017-04-21 DIAGNOSIS — E7800 Pure hypercholesterolemia, unspecified: Secondary | ICD-10-CM

## 2017-04-21 DIAGNOSIS — R7989 Other specified abnormal findings of blood chemistry: Secondary | ICD-10-CM | POA: Diagnosis not present

## 2017-04-21 LAB — COMPREHENSIVE METABOLIC PANEL
ALBUMIN: 4.5 g/dL (ref 3.5–5.2)
ALT: 23 U/L (ref 0–35)
AST: 18 U/L (ref 0–37)
Alkaline Phosphatase: 91 U/L (ref 39–117)
BUN: 12 mg/dL (ref 6–23)
CALCIUM: 10.6 mg/dL — AB (ref 8.4–10.5)
CHLORIDE: 102 meq/L (ref 96–112)
CO2: 33 meq/L — AB (ref 19–32)
Creatinine, Ser: 0.73 mg/dL (ref 0.40–1.20)
GFR: 102.04 mL/min (ref >60.00)
Glucose, Bld: 108 mg/dL — ABNORMAL HIGH (ref 70–99)
POTASSIUM: 4.3 meq/L (ref 3.5–5.1)
SODIUM: 141 meq/L (ref 135–145)
Total Bilirubin: 0.9 mg/dL (ref 0.2–1.2)
Total Protein: 7.9 g/dL (ref 6.0–8.3)

## 2017-04-21 LAB — LIPID PANEL
Cholesterol: 207 mg/dL — ABNORMAL HIGH (ref 0–200)
HDL: 85.2 mg/dL (ref >39.00)
LDL Cholesterol: 108 mg/dL — ABNORMAL HIGH (ref 0–99)
NonHDL: 121.54
TRIGLYCERIDES: 66 mg/dL (ref 0.0–149.0)
Total CHOL/HDL Ratio: 2
VLDL: 13.2 mg/dL (ref 0.0–40.0)

## 2017-04-21 LAB — CBC WITH DIFFERENTIAL/PLATELET
Basophils Absolute: 0 10*3/uL (ref 0.0–0.1)
Basophils Relative: 0.8 % (ref 0.0–3.0)
Eosinophils Absolute: 0.1 10*3/uL (ref 0.0–0.7)
Eosinophils Relative: 3 % (ref 0.0–5.0)
HCT: 43.2 % (ref 36.0–46.0)
HEMOGLOBIN: 14.6 g/dL (ref 12.0–15.0)
LYMPHS ABS: 1.3 10*3/uL (ref 0.7–4.0)
Lymphocytes Relative: 46.2 % — ABNORMAL HIGH (ref 12.0–46.0)
MCHC: 33.7 g/dL (ref 30.0–36.0)
MCV: 93.4 fl (ref 78.0–100.0)
MONO ABS: 0.2 10*3/uL (ref 0.1–1.0)
Monocytes Relative: 8.8 % (ref 3.0–12.0)
NEUTROS PCT: 41.2 % — AB (ref 43.0–77.0)
Neutro Abs: 1.1 10*3/uL — ABNORMAL LOW (ref 1.4–7.7)
Platelets: 255 10*3/uL (ref 150.0–400.0)
RBC: 4.63 Mil/uL (ref 3.87–5.11)
RDW: 12.8 % (ref 11.5–15.5)
WBC: 2.8 10*3/uL — AB (ref 4.0–10.5)

## 2017-04-21 LAB — TSH: TSH: 2.13 u[IU]/mL (ref 0.35–4.50)

## 2017-04-21 LAB — HEMOGLOBIN A1C: HEMOGLOBIN A1C: 6.4 % (ref 4.6–6.5)

## 2017-04-21 LAB — VITAMIN D 25 HYDROXY (VIT D DEFICIENCY, FRACTURES): VITD: 51.66 ng/mL (ref 30.00–100.00)

## 2017-04-25 ENCOUNTER — Ambulatory Visit (INDEPENDENT_AMBULATORY_CARE_PROVIDER_SITE_OTHER): Payer: Medicare Other | Admitting: Family Medicine

## 2017-04-25 ENCOUNTER — Ambulatory Visit: Payer: TRICARE For Life (TFL) | Admitting: Family Medicine

## 2017-04-25 ENCOUNTER — Encounter: Payer: Self-pay | Admitting: Family Medicine

## 2017-04-25 VITALS — BP 128/76 | HR 79 | Temp 98.0°F | Ht 62.5 in | Wt 134.2 lb

## 2017-04-25 DIAGNOSIS — E7800 Pure hypercholesterolemia, unspecified: Secondary | ICD-10-CM

## 2017-04-25 DIAGNOSIS — D72818 Other decreased white blood cell count: Secondary | ICD-10-CM | POA: Diagnosis not present

## 2017-04-25 DIAGNOSIS — R739 Hyperglycemia, unspecified: Secondary | ICD-10-CM | POA: Diagnosis not present

## 2017-04-25 DIAGNOSIS — R7989 Other specified abnormal findings of blood chemistry: Secondary | ICD-10-CM | POA: Diagnosis not present

## 2017-04-25 DIAGNOSIS — E559 Vitamin D deficiency, unspecified: Secondary | ICD-10-CM

## 2017-04-25 DIAGNOSIS — I10 Essential (primary) hypertension: Secondary | ICD-10-CM | POA: Diagnosis not present

## 2017-04-25 NOTE — Patient Instructions (Signed)
You are doing better with diet/exercise effort   Labs are re assuring  Liver tests are normal   Keep working on low sugar and low fat diet  Try to get most of your carbohydrates from produce (with the exception of white potatoes)  Eat less bread/pasta/rice/snack foods/cereals/sweets and other items from the middle of the grocery store (processed carbs) Avoid red meat/ fried foods/ egg yolks/ fatty breakfast meats/ butter, cheese and high fat dairy/ and shellfish    Keep exercising

## 2017-04-25 NOTE — Progress Notes (Signed)
Subjective:    Patient ID: Jasmine Rollins, female    DOB: [birth year], 67 y.o.   MRN: [id number]  HPI Here for f/u of chronic medical problems   Feeling good  Trying to take better care of herself   Wt Readings from Last 3 Encounters:  04/25/17 134 lb 4 oz (60.9 kg)  01/25/17 136 lb 4 oz (61.8 kg)  01/11/17 136 lb 12 oz (62 kg)  wt is down 2 lb  24.16 kg/m   bp is stable today  No cp or palpitations or headaches or edema  No side effects to medicines  BP Readings from Last 3 Encounters:  04/25/17 128/76  01/25/17 130/84  01/11/17 138/88     Lab Results  Component Value Date   CREATININE 0.73 04/21/2017   BUN 12 04/21/2017   NA 141 04/21/2017   K 4.3 04/21/2017   CL 102 04/21/2017   CO2 33 (H) 04/21/2017   Lab Results  Component Value Date   ALT 23 04/21/2017   AST 18 04/21/2017   ALKPHOS 91 04/21/2017   BILITOT 0.9 04/21/2017  her ast/alt are improved, they were elevated last time  Hx of low wbc Lab Results  Component Value Date   WBC 2.8 (L) 04/21/2017   HGB 14.6 04/21/2017   HCT 43.2 04/21/2017   MCV 93.4 04/21/2017   PLT 255.0 04/21/2017   asymptomatic  Down from 4.1/ usually around mid 3s  Reassuring diff  Glucose is 108 Hx of hyperglycemia Lab Results  Component Value Date   HGBA1C 6.4 04/21/2017  this is down from 6.6  She stopped sugar drinks Less sweets and desserts  Walking more - doing great with that at least 3 times per week  She seldom eats bread or cereal / occ pasta    D level is good at 51.6  Hyperlipidemia Lab Results  Component Value Date   CHOL 207 (H) 04/21/2017   CHOL 198 01/25/2017   CHOL 205 (H) 12/09/2015   Lab Results  Component Value Date   HDL 85.20 04/21/2017   HDL 75.50 01/25/2017   HDL 79.90 12/09/2015   Lab Results  Component Value Date   LDLCALC 108 (H) 04/21/2017   LDLCALC 107 (H) 01/25/2017   LDLCALC 107 (H) 12/09/2015   Lab Results  Component Value Date   TRIG 66.0 04/21/2017   TRIG 77.0  01/25/2017   TRIG 87.0 12/09/2015   Lab Results  Component Value Date   CHOLHDL 2 04/21/2017   CHOLHDL 3 01/25/2017   CHOLHDL 3 12/09/2015   Lab Results  Component Value Date   LDLDIRECT 106.4 02/26/2013   LDLDIRECT 104.2 09/27/2011   LDLDIRECT 86.7 11/27/2007  is eating better  No more sugar drinks   Thyroid is ok  Lab Results  Component Value Date   TSH 2.13 04/21/2017     Patient Active Problem List   Diagnosis Date Noted  . Vitamin D deficiency 12/06/2015  . GERD (gastroesophageal reflux disease) 06/09/2014  . Encounter for routine gynecological examination 03/03/2014  . Estrogen deficiency 03/03/2014  . Heartburn 03/01/2013  . Gynecological examination 06/22/2010  . Special screening for malignant neoplasms, colon 06/22/2010  . Glaucoma suspect 06/22/2010  . Routine general medical examination at a health care facility 06/03/2010  . Sleep apnea   . Osteoporosis 05/20/2009  . SLEEP APNEA 10/17/2008  . COUGH VARIANT ASTHMA 07/30/2008  . Leukocytopenia 01/29/2008  . Hyperglycemia 11/27/2007  . HYPERCHOLESTEROLEMIA 03/21/2007  . Essential hypertension 03/21/2007  .  Sinusitis, chronic 03/21/2007  . Allergic rhinitis 03/21/2007  . DIVERTICULOSIS, COLON 03/21/2007   Past Medical History:  Diagnosis Date  . Allergic rhinitis   . Arthritis    hip  . Borderline high cholesterol   . Cataract   . Cough variant asthma   . Diverticulosis of colon   . GERD (gastroesophageal reflux disease)   . HTN (hypertension)   . Hyperglycemia    borderline DM  . OP (osteoporosis)   . Seasonal allergies   . Sleep apnea    no cpap   Past Surgical History:  Procedure Laterality Date  . COLONOSCOPY    . COLPOSCOPY    . ESOPHAGOGASTRODUODENOSCOPY  10/01   Negative  . ROTATOR CUFF REPAIR     right  . TUBAL LIGATION     Social History   Tobacco Use  . Smoking status: Never Smoker  . Smokeless tobacco: Never Used  . Tobacco comment: Remote 2nd hand exposure  Substance  Use Topics  . Alcohol use: No    Alcohol/week: 0.0 oz  . Drug use: No   Family History  Problem Relation Age of Onset  . Hypertension Father   . Diabetes Father   . Diabetes Brother   . Diabetes Brother   . Cancer Brother        prostate  . Cancer Brother        prostate  . Cancer Brother        prostate  . Cancer Brother        prostate  . Cancer Brother        prostate  . Cancer Brother        prostate  . Colon cancer Neg Hx    Allergies  Allergen Reactions  . Sulfonamide Derivatives Rash   Current Outpatient Medications on File Prior to Visit  Medication Sig Dispense Refill  . albuterol (PROVENTIL HFA) 108 (90 Base) MCG/ACT inhaler INHALE 2 PUFFS EVERY 4 HOURS AS NEEDED 3 Inhaler 3  . Calcium Carb-Cholecalciferol (CALCIUM 600+D3) 600-800 MG-UNIT TABS Take by mouth.    Haywood Pao HFA 44 MCG/ACT inhaler USE 2 INHALATIONS TWICE A DAY (RINSE MOUTH AFTER USE) 31.8 g 1  . fluticasone (FLONASE) 50 MCG/ACT nasal spray Place 2 sprays into both nostrils daily. 48 g 3  . latanoprost (XALATAN) 0.005 % ophthalmic solution Place 1 drop into both eyes at bedtime.     . Loratadine (CLARITIN PO) Take by mouth.    . montelukast (SINGULAIR) 10 MG tablet Take 1 tablet (10 mg total) by mouth at bedtime. 90 tablet 3  . olmesartan-hydrochlorothiazide (BENICAR HCT) 40-12.5 MG tablet TAKE 1 TABLET DAILY 90 tablet 0  . ranitidine (ZANTAC) 150 MG tablet TAKE 1 TABLET TWICE A DAY 180 tablet 3   No current facility-administered medications on file prior to visit.     Review of Systems  Constitutional: Negative for activity change, appetite change, fatigue, fever and unexpected weight change.  HENT: Negative for congestion, ear pain, rhinorrhea, sinus pressure and sore throat.   Eyes: Negative for pain, redness and visual disturbance.  Respiratory: Negative for cough, shortness of breath and wheezing.   Cardiovascular: Negative for chest pain and palpitations.  Gastrointestinal: Negative for  abdominal pain, blood in stool, constipation and diarrhea.  Endocrine: Negative for polydipsia and polyuria.  Genitourinary: Negative for dysuria, frequency and urgency.  Musculoskeletal: Negative for arthralgias, back pain and myalgias.  Skin: Negative for pallor and rash.  Allergic/Immunologic: Negative for environmental allergies.  Neurological:  Negative for dizziness, syncope and headaches.  Hematological: Negative for adenopathy. Does not bruise/bleed easily.  Psychiatric/Behavioral: Negative for decreased concentration and dysphoric mood. The patient is not nervous/anxious.        Objective:   Physical Exam  Constitutional: She appears well-developed and well-nourished. No distress.  Well appearing   HENT:  Head: Normocephalic and atraumatic.  Mouth/Throat: Oropharynx is clear and moist.  Eyes: Conjunctivae and EOM are normal. Pupils are equal, round, and reactive to light.  Neck: Normal range of motion. Neck supple. No JVD present. Carotid bruit is not present. No thyromegaly present.  Cardiovascular: Normal rate, regular rhythm, normal heart sounds and intact distal pulses. Exam reveals no gallop.  Pulmonary/Chest: Effort normal and breath sounds normal. No respiratory distress. She has no wheezes. She has no rales.  No crackles No wheezing   Abdominal: Soft. Bowel sounds are normal. She exhibits no distension, no abdominal bruit and no mass. There is no tenderness.  Musculoskeletal: She exhibits no edema.  Lymphadenopathy:    She has no cervical adenopathy.  Neurological: She is alert. She has normal reflexes. She displays no tremor. No cranial nerve deficit. She exhibits normal muscle tone. Coordination normal.  Skin: Skin is warm and dry. No rash noted. No pallor.  Psychiatric: She has a normal mood and affect.          Assessment & Plan:   Problem List Items Addressed This Visit      Cardiovascular and Mediastinum   Essential hypertension    bp in fair control  at this time  BP Readings from Last 1 Encounters:  04/25/17 128/76   No changes needed Disc lifstyle change with low sodium diet and exercise  Labs rev  Commended improved health habits         Other   RESOLVED: Elevated TSH    Normalized Lab Results  Component Value Date   TSH 2.13 04/21/2017         HYPERCHOLESTEROLEMIA    Disc goals for lipids and reasons to control them Rev labs with pt Rev low sat fat diet in detail Great HDL and ratio Enc to keep up the good work      Hyperglycemia - Primary    Lab Results  Component Value Date   HGBA1C 6.4 04/21/2017   This is improved  disc imp of low glycemic diet and wt loss to prevent DM2 Commended good effort      Leukocytopenia    Wbc 2.8 with reassuring diff No symptoms Continue to watch      Vitamin D deficiency    Vitamin D level is therapeutic with current supplementation Disc importance of this to bone and overall health Well controlled-level in the 50s

## 2017-04-25 NOTE — Assessment & Plan Note (Signed)
Vitamin D level is therapeutic with current supplementation Disc importance of this to bone and overall health Well controlled-level in the 50s

## 2017-04-25 NOTE — Assessment & Plan Note (Signed)
Wbc 2.8 with reassuring diff No symptoms Continue to watch

## 2017-04-25 NOTE — Assessment & Plan Note (Signed)
Lab Results  Component Value Date   HGBA1C 6.4 04/21/2017   This is improved  disc imp of low glycemic diet and wt loss to prevent DM2 Commended good effort

## 2017-04-25 NOTE — Assessment & Plan Note (Signed)
bp in fair control at this time  BP Readings from Last 1 Encounters:  04/25/17 128/76   No changes needed Disc lifstyle change with low sodium diet and exercise  Labs rev  Commended improved health habits

## 2017-04-25 NOTE — Assessment & Plan Note (Signed)
Disc goals for lipids and reasons to control them Rev labs with pt Rev low sat fat diet in detail Great HDL and ratio Enc to keep up the good work

## 2017-04-25 NOTE — Assessment & Plan Note (Signed)
Normalized Lab Results  Component Value Date   TSH 2.13 04/21/2017

## 2017-05-20 ENCOUNTER — Other Ambulatory Visit: Payer: Self-pay | Admitting: Family Medicine

## 2017-06-06 ENCOUNTER — Other Ambulatory Visit: Payer: Self-pay | Admitting: Family Medicine

## 2017-08-04 ENCOUNTER — Encounter: Payer: Self-pay | Admitting: Family Medicine

## 2017-08-04 ENCOUNTER — Ambulatory Visit (INDEPENDENT_AMBULATORY_CARE_PROVIDER_SITE_OTHER): Payer: Medicare Other | Admitting: Family Medicine

## 2017-08-04 VITALS — BP 140/90 | HR 80 | Temp 98.4°F | Ht 62.5 in | Wt 132.0 lb

## 2017-08-04 DIAGNOSIS — M67912 Unspecified disorder of synovium and tendon, left shoulder: Secondary | ICD-10-CM

## 2017-08-04 DIAGNOSIS — M542 Cervicalgia: Secondary | ICD-10-CM | POA: Diagnosis not present

## 2017-08-04 DIAGNOSIS — G2589 Other specified extrapyramidal and movement disorders: Secondary | ICD-10-CM | POA: Diagnosis not present

## 2017-08-04 MED ORDER — TIZANIDINE HCL 4 MG PO TABS: 4.0000 mg | ORAL_TABLET | Freq: Every evening | ORAL | 2 refills | Status: DC

## 2017-08-04 NOTE — Progress Notes (Signed)
Dr. Karleen HampshireSpencer T. Bartlett Enke, MD, CAQ [facility] [address] [place], [id number] Phone: [phone] Fax: [phone]  08/04/2017  Patient: Jasmine RoyaltyMattie E Rollins, MRN: [id number], DOB: [birth year], 67 y.o.  Primary Physician:  Tower, Audrie GallusMarne A, MD   Chief Complaint  Patient presents with  . Shoulder Pain    Left x 2 weeks   Subjective:   Jasmine Rollins is a 67 y.o. very pleasant female patient who presents with the following:  2 weeks, started with a crick in her neck. Cream seemed to get better orginally. Was cutting some grass and it started to hurt. Now some pain in the shoulder blade. ? Knot on the posterior shoulder and throbbed all night long. No numbness or tingling.   She is now having some persistent pain in her neck, as well as in the shoulder blade region, primarily in the trap area on the left.  She is also having some pain with abduction as well with terminal internal range of motion.  She has not had any trauma or any prior operative intervention in her neck or as well as her shoulders.  RTC tendinopathy and scapular dyskinesis with decreased neck rom  Past Medical History, Surgical History, Social History, Family History, Problem List, Medications, and Allergies have been reviewed and updated if relevant.  Patient Active Problem List   Diagnosis Date Noted  . Vitamin D deficiency 12/06/2015  . GERD (gastroesophageal reflux disease) 06/09/2014  . Encounter for routine gynecological examination 03/03/2014  . Estrogen deficiency 03/03/2014  . Heartburn 03/01/2013  . Gynecological examination 06/22/2010  . Special screening for malignant neoplasms, colon 06/22/2010  . Glaucoma suspect 06/22/2010  . Routine general medical examination at a health care facility 06/03/2010  . Sleep apnea   . Osteoporosis 05/20/2009  . SLEEP APNEA 10/17/2008  . COUGH VARIANT ASTHMA 07/30/2008  . Leukocytopenia 01/29/2008  . Hyperglycemia 11/27/2007  .  HYPERCHOLESTEROLEMIA 03/21/2007  . Essential hypertension 03/21/2007  . Sinusitis, chronic 03/21/2007  . Allergic rhinitis 03/21/2007  . DIVERTICULOSIS, COLON 03/21/2007    Past Medical History:  Diagnosis Date  . Allergic rhinitis   . Arthritis    hip  . Borderline high cholesterol   . Cataract   . Cough variant asthma   . Diverticulosis of colon   . GERD (gastroesophageal reflux disease)   . HTN (hypertension)   . Hyperglycemia    borderline DM  . OP (osteoporosis)   . Seasonal allergies   . Sleep apnea    no cpap    Past Surgical History:  Procedure Laterality Date  . COLONOSCOPY    . COLPOSCOPY    . ESOPHAGOGASTRODUODENOSCOPY  10/01   Negative  . ROTATOR CUFF REPAIR     right  . TUBAL LIGATION      Social History   Socioeconomic History  . Marital status: Married    Spouse name: Not on file  . Number of children: 1  . Years of education: Not on file  . Highest education level: Not on file  Occupational History  . Occupation: Clinical biochemistCustomer Service  Social Needs  . Financial resource strain: Not on file  . Food insecurity:    Worry: Not on file    Inability: Not on file  . Transportation needs:    Medical: Not on file    Non-medical: Not on file  Tobacco Use  . Smoking status: Never Smoker  . Smokeless tobacco: Never Used  . Tobacco comment:  Remote 2nd hand exposure  Substance and Sexual Activity  . Alcohol use: No    Alcohol/week: 0.0 oz  . Drug use: No  . Sexual activity: Yes  Lifestyle  . Physical activity:    Days per week: Not on file    Minutes per session: Not on file  . Stress: Not on file  Relationships  . Social connections:    Talks on phone: Not on file    Gets together: Not on file    Attends religious service: Not on file    Active member of club or organization: Not on file    Attends meetings of clubs or organizations: Not on file    Relationship status: Not on file  . Intimate partner violence:    Fear of current or ex  partner: Not on file    Emotionally abused: Not on file    Physically abused: Not on file    Forced sexual activity: Not on file  Other Topics Concern  . Not on file  Social History Narrative  . Not on file    Family History  Problem Relation Age of Onset  . Hypertension Father   . Diabetes Father   . Diabetes Brother   . Diabetes Brother   . Cancer Brother        prostate  . Cancer Brother        prostate  . Cancer Brother        prostate  . Cancer Brother        prostate  . Cancer Brother        prostate  . Cancer Brother        prostate  . Colon cancer Neg Hx     Allergies  Allergen Reactions  . Sulfonamide Derivatives Rash    Medication list reviewed and updated in full in [facility].  GEN: No fevers, chills. Nontoxic. Primarily MSK c/o today. MSK: Detailed in the HPI GI: tolerating PO intake without difficulty Neuro: No numbness, parasthesias, or tingling associated. Otherwise the pertinent positives of the ROS are noted above.   Objective:   BP 140/90   Pulse 80   Temp 98.4 F (36.9 C) (Oral)   Ht 5' 2.5" (1.588 m)   Wt 132 lb (59.9 kg)   BMI 23.76 kg/m    GEN: Well-developed,well-nourished,in no acute distress; alert,appropriate and cooperative throughout examination HEENT: Normocephalic and atraumatic without obvious abnormalities. Ears, externally no deformities PULM: Breathing comfortably in no respiratory distress EXT: No clubbing, cyanosis, or edema PSYCH: Normally interactive. Cooperative during the interview. Pleasant. Friendly and conversant. Not anxious or depressed appearing. Normal, full affect.  CERVICAL SPINE EXAM Range of motion: Flexion, extension, lateral bending, and rotation: She has an approximate 35% loss of motion in all directions, most notable in the lateral movement. Pain with terminal motion: Yes mildly tender Spinous Processes: NT SCM: NT Upper paracervical muscles: mildly tender Upper traps: left upper trap is  mildly tender C5-T1 intact, sensation and motor   Shoulder: L Inspection: No muscle wasting or winging Ecchymosis/edema: neg  AC joint, scapula, clavicle: NT Abduction: full, 5/5 painful arc of motion  Flexion: full, 5/5 IR, full, lift-off: 5/5 ER at neutral: full, 5/5 AC crossover: neg Neer: pos Hawkins: pos Drop Test: neg Empty Can: pos Supraspinatus insertion: mild-mod T Bicipital groove: NT Speed's: neg Yergason's: neg Sulcus sign: neg Scapular dyskinesis: weak scapular stabilizers C5-T1 intact  Neuro: Sensation intact Grip 5/5   Radiology: No results found.  Assessment and Plan:   Tendinopathy of left rotator cuff  Scapular dyskinesis  Cervicalgia  >25 minutes spent in face to face time with patient, >50% spent in counselling or coordination of care   No clear mechanism, but initiating event of new onset neck pain with restricted motion at the neck and altered scapular mechanics is the likely culprit.  She has developed some impingement as well as some scapular dyskinesis.  I would anticipate that she will do well with some rehab, and I gave her some home rehab for both her neck as well as a throwers 10 type program.  If symptoms persist, she is going to call me and I will set her up with formal PT, then follow-up with her in about 4 to 5 weeks.  Meds ordered this encounter  Medications  . tiZANidine (ZANAFLEX) 4 MG tablet    Sig: Take 1 tablet (4 mg total) by mouth Nightly.    Dispense:  30 tablet    Refill:  2    Signed,  Lehman Whiteley T. Moneka Mcquinn, MD   Allergies as of 08/04/2017      Reactions   Sulfonamide Derivatives Rash      Medication List        Accurate as of 08/04/17 11:59 PM. Always use your most recent med list.          albuterol 108 (90 Base) MCG/ACT inhaler Commonly known as:  PROVENTIL HFA INHALE 2 PUFFS EVERY 4 HOURS AS NEEDED   BENICAR HCT 40-12.5 MG tablet Generic drug:  olmesartan-hydrochlorothiazide TAKE 1 TABLET DAILY     CALCIUM 600+D3 600-800 MG-UNIT Tabs Generic drug:  Calcium Carb-Cholecalciferol Take by mouth.   CLARITIN PO Take by mouth.   FLOVENT HFA 44 MCG/ACT inhaler Generic drug:  fluticasone USE 2 INHALATIONS TWICE A DAY (RINSE MOUTH AFTER USE)   fluticasone 50 MCG/ACT nasal spray Commonly known as:  FLONASE Place 2 sprays into both nostrils daily.   latanoprost 0.005 % ophthalmic solution Commonly known as:  XALATAN Place 1 drop into both eyes at bedtime.   montelukast 10 MG tablet Commonly known as:  SINGULAIR TAKE 1 TABLET AT BEDTIME   ranitidine 150 MG tablet Commonly known as:  ZANTAC TAKE 1 TABLET TWICE A DAY   tiZANidine 4 MG tablet Commonly known as:  ZANAFLEX Take 1 tablet (4 mg total) by mouth Nightly.

## 2017-09-17 ENCOUNTER — Encounter (HOSPITAL_COMMUNITY): Payer: Self-pay

## 2017-09-17 ENCOUNTER — Ambulatory Visit (HOSPITAL_COMMUNITY)
Admission: EM | Admit: 2017-09-17 | Discharge: 2017-09-17 | Disposition: A | Discharge status: Home / Self Care | Payer: Medicare Other | Attending: Family Medicine | Admitting: Family Medicine

## 2017-09-17 DIAGNOSIS — H66003 Acute suppurative otitis media without spontaneous rupture of ear drum, bilateral: Secondary | ICD-10-CM

## 2017-09-17 MED ORDER — AMOXICILLIN-POT CLAVULANATE 875-125 MG PO TABS: 1.0000 | ORAL_TABLET | Freq: Two times a day (BID) | ORAL | 0 refills | Status: DC

## 2017-09-17 NOTE — ED Triage Notes (Signed)
Pt presents with fever and earache x 3 days.

## 2017-09-17 NOTE — Discharge Instructions (Addendum)
Rest and drink plenty of fluids Prescribed augmentin 875 x 10 days Take medications as directed and to completion Continue to alternate OTC advil/aleve/ibuprofen (NSAIDs) and tylenol as needed for pain control Follow up with PCP if symptoms persists Return here or go to the ER if you have any new or worsening symptoms

## 2017-09-17 NOTE — ED Provider Notes (Signed)
[facility]   [id number] 09/17/17 Arrival Time: 1123  SUBJECTIVE: History from: patient.  Jasmine Rollins is a 67 y.o. female who presents with of left ear pain x 3 days.  Denies a precipitating event, such as swimming or wearing ear plugs.  Patient states the pain is intermittent and sharp in character.  Patient has tried advil with relief.  Symptoms are made worse with bending forward.  Reports similar symptoms in the past as a child.    Reports fever with tmax of 101.8 at home, chills, fatigue, sinus pain and pressure, nasal congestion.  Denies rhinorrhea, ear discharge, sore throat, cough, SOB, wheezing, chest pain, nausea, changes in bowel or bladder habits.    ROS: As per HPI.  Past Medical History:  Diagnosis Date  . Allergic rhinitis   . Arthritis    hip  . Borderline high cholesterol   . Cataract   . Cough variant asthma   . Diverticulosis of colon   . GERD (gastroesophageal reflux disease)   . HTN (hypertension)   . Hyperglycemia    borderline DM  . OP (osteoporosis)   . Seasonal allergies   . Sleep apnea    no cpap   Past Surgical History:  Procedure Laterality Date  . COLONOSCOPY    . COLPOSCOPY    . ESOPHAGOGASTRODUODENOSCOPY  10/01   Negative  . ROTATOR CUFF REPAIR     right  . TUBAL LIGATION     Allergies  Allergen Reactions  . Sulfonamide Derivatives Rash   No current facility-administered medications on file prior to encounter.    Current Outpatient Medications on File Prior to Encounter  Medication Sig Dispense Refill  . albuterol (PROVENTIL HFA) 108 (90 Base) MCG/ACT inhaler INHALE 2 PUFFS EVERY 4 HOURS AS NEEDED 3 Inhaler 3  . BENICAR HCT 40-12.5 MG tablet TAKE 1 TABLET DAILY 90 tablet 1  . Calcium Carb-Cholecalciferol (CALCIUM 600+D3) 600-800 MG-UNIT TABS Take by mouth.    Haywood Pao. FLOVENT HFA 44 MCG/ACT inhaler USE 2 INHALATIONS TWICE A DAY (RINSE MOUTH AFTER USE) 31.8 g 1  . fluticasone (FLONASE) 50 MCG/ACT nasal spray Place 2 sprays into  both nostrils daily. 48 g 3  . latanoprost (XALATAN) 0.005 % ophthalmic solution Place 1 drop into both eyes at bedtime.     . Loratadine (CLARITIN PO) Take by mouth.    . montelukast (SINGULAIR) 10 MG tablet TAKE 1 TABLET AT BEDTIME 90 tablet 1  . ranitidine (ZANTAC) 150 MG tablet TAKE 1 TABLET TWICE A DAY 180 tablet 3  . tiZANidine (ZANAFLEX) 4 MG tablet Take 1 tablet (4 mg total) by mouth Nightly. 30 tablet 2   Social History   Socioeconomic History  . Marital status: Married    Spouse name: Not on file  . Number of children: 1  . Years of education: Not on file  . Highest education level: Not on file  Occupational History  . Occupation: Clinical biochemistCustomer Service  Social Needs  . Financial resource strain: Not on file  . Food insecurity:    Worry: Not on file    Inability: Not on file  . Transportation needs:    Medical: Not on file    Non-medical: Not on file  Tobacco Use  . Smoking status: Never Smoker  . Smokeless tobacco: Never Used  . Tobacco comment: Remote 2nd hand exposure  Substance and Sexual Activity  . Alcohol use: No    Alcohol/week: 0.0 oz  . Drug use: No  . Sexual  activity: Yes  Lifestyle  . Physical activity:    Days per week: Not on file    Minutes per session: Not on file  . Stress: Not on file  Relationships  . Social connections:    Talks on phone: Not on file    Gets together: Not on file    Attends religious service: Not on file    Active member of club or organization: Not on file    Attends meetings of clubs or organizations: Not on file    Relationship status: Not on file  . Intimate partner violence:    Fear of current or ex partner: Not on file    Emotionally abused: Not on file    Physically abused: Not on file    Forced sexual activity: Not on file  Other Topics Concern  . Not on file  Social History Narrative  . Not on file   Family History  Problem Relation Age of Onset  . Hypertension Father   . Diabetes Father   . Diabetes  Brother   . Diabetes Brother   . Cancer Brother        prostate  . Cancer Brother        prostate  . Cancer Brother        prostate  . Cancer Brother        prostate  . Cancer Brother        prostate  . Cancer Brother        prostate  . Colon cancer Neg Hx     OBJECTIVE:  Vitals:   09/17/17 1159  BP: 107/78  Pulse: (!) 108  Resp: 18  Temp: 98.6 F (37 C)  SpO2: 100%     General appearance: alert; appears fatigued HEENT: Ears: EACs clear, bilateral TMs with purulent fluid behind the TM and injected; tender to auricle manipulation; no erythema about the mastoids Eyes: PERRL, EOMI grossly; frontal and maxillary sinus tenderness; Nose: clear rhinorrhea; Throat: oropharynx mildly erythematous, tonsils 2+ without white tonsillar exudates, uvula midline Neck: LAD Lungs: CTAB without adventitious breath sounds Heart: regular rate and rhythm.  Radial pulses 2+ symmetrical bilaterally at 100 BPM Skin: warm and dry Psychological: alert and cooperative; normal mood and affect  ASSESSMENT & PLAN:  1. Non-recurrent acute suppurative otitis media of both ears without spontaneous rupture of tympanic membranes     Meds ordered this encounter  Medications  . amoxicillin-clavulanate (AUGMENTIN) 875-125 MG tablet    Sig: Take 1 tablet by mouth every 12 (twelve) hours for 10 days.    Dispense:  20 tablet    Refill:  0    Order Specific Question:   Supervising Provider    Answer:   Isa Rankin [[id number]]   Rest and drink plenty of fluids Prescribed augmentin 875 x 10 days Take medications as directed and to completion Continue to alternate OTC advil/aleve/ibuprofen (NSAIDs) and tylenol as needed for pain control Follow up with PCP if symptoms persists Return here or go to the ER if you have any new or worsening symptoms    Reviewed expectations re: course of current medical issues. Questions answered. Outlined signs and symptoms indicating need for more acute  intervention. Patient verbalized understanding. After Visit Summary given.         Rennis Harding, PA-C 09/17/17 1342

## 2017-09-19 ENCOUNTER — Other Ambulatory Visit: Payer: Self-pay | Admitting: *Deleted

## 2017-09-19 ENCOUNTER — Ambulatory Visit (INDEPENDENT_AMBULATORY_CARE_PROVIDER_SITE_OTHER): Payer: Medicare Other | Admitting: Family Medicine

## 2017-09-19 ENCOUNTER — Encounter: Payer: Self-pay | Admitting: Family Medicine

## 2017-09-19 ENCOUNTER — Ambulatory Visit: Payer: Self-pay | Admitting: *Deleted

## 2017-09-19 VITALS — BP 146/80 | HR 79 | Temp 98.1°F | Ht 62.5 in | Wt 128.0 lb

## 2017-09-19 DIAGNOSIS — H6503 Acute serous otitis media, bilateral: Secondary | ICD-10-CM

## 2017-09-19 DIAGNOSIS — H6690 Otitis media, unspecified, unspecified ear: Secondary | ICD-10-CM | POA: Insufficient documentation

## 2017-09-19 DIAGNOSIS — R197 Diarrhea, unspecified: Secondary | ICD-10-CM | POA: Insufficient documentation

## 2017-09-19 MED ORDER — AMOXICILLIN 500 MG PO CAPS: 500.0000 mg | ORAL_CAPSULE | Freq: Three times a day (TID) | ORAL | 0 refills | Status: DC

## 2017-09-19 NOTE — Progress Notes (Signed)
Subjective:    Patient ID: Jasmine Rollins, female    DOB: [birth year], 67 y.o.   MRN: [id number]  HPI Here for diarrhea worsened by abx   Was seen in UC on 8/4 for bilateral ear infections and ? Sinus infectoin  tx with augmentin   She started diarrhea 8/3 evening after eating col[employer] slaw and cream pie - at a reunion  Started within 4 h No n/v Diarrhea was watery and frequent  She kept drinking fluids  No abdominal pain at all   No sick contacts known but unsure   Now has worse diarrhea  Tried to eat egg/toast - had urgent bm when she tries to eat  Watery stool  No blood  No abd pain  Temp nl this am (low grade before that and 101.8 initially)   No otc medicines  Taking tylenol and advil for temp/discomfort   Patient Active Problem List   Diagnosis Date Noted  . Diarrhea 09/19/2017  . Otitis media 09/19/2017  . Vitamin D deficiency 12/06/2015  . GERD (gastroesophageal reflux disease) 06/09/2014  . Encounter for routine gynecological examination 03/03/2014  . Estrogen deficiency 03/03/2014  . Heartburn 03/01/2013  . Gynecological examination 06/22/2010  . Special screening for malignant neoplasms, colon 06/22/2010  . Glaucoma suspect 06/22/2010  . Routine general medical examination at a health care facility 06/03/2010  . Sleep apnea   . Osteoporosis 05/20/2009  . SLEEP APNEA 10/17/2008  . COUGH VARIANT ASTHMA 07/30/2008  . Leukocytopenia 01/29/2008  . Hyperglycemia 11/27/2007  . HYPERCHOLESTEROLEMIA 03/21/2007  . Essential hypertension 03/21/2007  . Sinusitis, chronic 03/21/2007  . Allergic rhinitis 03/21/2007  . DIVERTICULOSIS, COLON 03/21/2007   Past Medical History:  Diagnosis Date  . Allergic rhinitis   . Arthritis    hip  . Borderline high cholesterol   . Cataract   . Cough variant asthma   . Diverticulosis of colon   . GERD (gastroesophageal reflux disease)   . HTN (hypertension)   . Hyperglycemia    borderline DM  . OP (osteoporosis)   .  Seasonal allergies   . Sleep apnea    no cpap   Past Surgical History:  Procedure Laterality Date  . COLONOSCOPY    . COLPOSCOPY    . ESOPHAGOGASTRODUODENOSCOPY  10/01   Negative  . ROTATOR CUFF REPAIR     right  . TUBAL LIGATION     Social History   Tobacco Use  . Smoking status: Never Smoker  . Smokeless tobacco: Never Used  . Tobacco comment: Remote 2nd hand exposure  Substance Use Topics  . Alcohol use: No    Alcohol/week: 0.0 oz  . Drug use: No   Family History  Problem Relation Age of Onset  . Hypertension Father   . Diabetes Father   . Diabetes Brother   . Diabetes Brother   . Cancer Brother        prostate  . Cancer Brother        prostate  . Cancer Brother        prostate  . Cancer Brother        prostate  . Cancer Brother        prostate  . Cancer Brother        prostate  . Colon cancer Neg Hx    Allergies  Allergen Reactions  . Sulfonamide Derivatives Rash   Current Outpatient Medications on File Prior to Visit  Medication Sig Dispense Refill  . albuterol (PROVENTIL HFA) 108 (  90 Base) MCG/ACT inhaler INHALE 2 PUFFS EVERY 4 HOURS AS NEEDED 3 Inhaler 3  . BENICAR HCT 40-12.5 MG tablet TAKE 1 TABLET DAILY 90 tablet 1  . Calcium Carb-Cholecalciferol (CALCIUM 600+D3) 600-800 MG-UNIT TABS Take by mouth.    Haywood Pao HFA 44 MCG/ACT inhaler USE 2 INHALATIONS TWICE A DAY (RINSE MOUTH AFTER USE) 31.8 g 1  . fluticasone (FLONASE) 50 MCG/ACT nasal spray Place 2 sprays into both nostrils daily. 48 g 3  . latanoprost (XALATAN) 0.005 % ophthalmic solution Place 1 drop into both eyes at bedtime.     . Loratadine (CLARITIN PO) Take by mouth.    . montelukast (SINGULAIR) 10 MG tablet TAKE 1 TABLET AT BEDTIME 90 tablet 1  . ranitidine (ZANTAC) 150 MG tablet TAKE 1 TABLET TWICE A DAY 180 tablet 3   No current facility-administered medications on file prior to visit.     Review of Systems  Constitutional: Positive for fatigue. Negative for activity change,  appetite change, fever and unexpected weight change.  HENT: Positive for ear pain, postnasal drip, rhinorrhea and sinus pressure. Negative for congestion, ear discharge, facial swelling, hearing loss, nosebleeds, sinus pain, sore throat and voice change.        Ear pain is much improved  Eyes: Negative for pain, redness and visual disturbance.  Respiratory: Negative for cough, shortness of breath and wheezing.        Had a little cough that is improved  Cardiovascular: Negative for chest pain and palpitations.  Gastrointestinal: Positive for diarrhea. Negative for abdominal distention, abdominal pain, anal bleeding, blood in stool, constipation, nausea, rectal pain and vomiting.  Endocrine: Negative for polydipsia and polyuria.  Genitourinary: Negative for dysuria, frequency and urgency.  Musculoskeletal: Negative for arthralgias, back pain and myalgias.  Skin: Negative for pallor and rash.  Allergic/Immunologic: Negative for environmental allergies.  Neurological: Negative for dizziness, syncope and headaches.  Hematological: Negative for adenopathy. Does not bruise/bleed easily.  Psychiatric/Behavioral: Negative for decreased concentration and dysphoric mood. The patient is not nervous/anxious.        Objective:   Physical Exam  Constitutional: She appears well-developed and well-nourished. No distress.  Well appearing   HENT:  Head: Normocephalic and atraumatic.  Right Ear: External ear normal.  Left Ear: External ear normal.  Mouth/Throat: Oropharynx is clear and moist. No oropharyngeal exudate.  Nares are injected and congested  No sinus tenderness Clear rhinorrhea and post nasal drip   TMs are not erythematous / some dullness however   Eyes: Pupils are equal, round, and reactive to light. Conjunctivae and EOM are normal. Right eye exhibits no discharge. Left eye exhibits no discharge. No scleral icterus.  Neck: Normal range of motion. Neck supple.  Cardiovascular: Normal  rate, regular rhythm and normal heart sounds.  Pulmonary/Chest: Effort normal and breath sounds normal. No respiratory distress. She has no wheezes. She has no rales. She exhibits no tenderness.  No wheeze even on forced exp  Abdominal: Soft. Bowel sounds are normal. She exhibits no distension and no mass. There is no tenderness. There is no rebound and no guarding.  Musculoskeletal: She exhibits no edema.  Lymphadenopathy:    She has no cervical adenopathy.  Neurological: She is alert. No cranial nerve deficit.  Skin: Skin is warm and dry. Capillary refill takes less than 2 seconds. No rash noted. She is not diaphoretic. No erythema. No pallor.  Psychiatric: She has a normal mood and affect.          Assessment &  Plan:   Problem List Items Addressed This Visit      Nervous and Auditory   Otitis media    Reviewed UC note from 8/4 bilat OM with poss early sinusitis  augmentin worsened the diarrhea she was already having  Will change that to plain amoxicillin  Clinically improved and temp is down  inst to take amox tid Analgesics prn Update if not starting to improve in a week or if worsening        Relevant Medications   amoxicillin (AMOXIL) 500 MG capsule     Other   Diarrhea - Primary    This started before starting augmentin for her OM and worsened with it  Will change that to plain amoxicillin -should lessen any med side effect Unsure if originally food bourne or viral (started 1 h after eating at [place])  No n/v Reassuring exam Disc supportive care- fluids /BRAT diet as tolerated If no imp-consider stool studies Update if not starting to improve in a week or if worsening

## 2017-09-19 NOTE — Telephone Encounter (Signed)
Pt has appt with Dr Milinda Antisower 09/19/17 at 10 AM.

## 2017-09-19 NOTE — Telephone Encounter (Signed)
Pt called with complaints of side effects from antibiotics started on 09/17/17 per urgent care provider (due to ear pain and increased temperature): watery diarrhea every 2 hours, and headaches; the pt states that she had diarrhea on Saturday 09/17/17 1 hour after she ate cole slaw and cream pie at a reunion; she states that her temperature is 98.0 today at 0815 and 99.9 yesterday (alternating tylenol and motrin); recommendations made per nurse triage protocol to include seeing a physician within 4 hours; pt offered and accepted [facility], today at 1000; she verbalizes understanding; also [facility] regarding this appointment.  Reason for Disposition . [1] SEVERE diarrhea (e.g., 7 or more times / day more than normal) AND [2] age > 60 years  Answer Assessment - Initial Assessment Questions 1. DIARRHEA SEVERITY: "How bad is the diarrhea?" "How many extra stools have you had in the past 24 hours than normal?"    - NO DIARRHEA (SCALE 0)   - MILD (SCALE 1-3): Few loose or mushy BMs; increase of 1-3 stools over normal daily number of stools; mild increase in ostomy output.   -  MODERATE (SCALE 4-7): Increase of 4-6 stools daily over normal; moderate increase in ostomy output. * SEVERE (SCALE 8-10; OR 'WORST POSSIBLE'): Increase of 7 or more stools daily over normal; moderate increase in ostomy output; incontinence.     Severe; q 2-3 hours since Sunday 09/17/17 2. ONSET: "When did the diarrhea begin?"      09/17/17 3. BM CONSISTENCY: "How loose or watery is the diarrhea?"      watery 4. VOMITING: "Are you also vomiting?" If so, ask: "How many times in the past 24 hours?"      no 5. ABDOMINAL PAIN: "Are you having any abdominal pain?" If yes: "What does it feel like?" (e.g., crampy, dull, intermittent, constant)      Abdominal pain, intermittent 6. ABDOMINAL PAIN SEVERITY: If present, ask: "How bad is the pain?"  (e.g., Scale 1-10; mild, moderate, or  severe)   - MILD (1-3): doesn't interfere with normal activities, abdomen soft and not tender to touch    - MODERATE (4-7): interferes with normal activities or awakens from sleep, tender to touch    - SEVERE (8-10): excruciating pain, doubled over, unable to do any normal activities       mild 7. ORAL INTAKE: If vomiting, "Have you been able to drink liquids?" "How much fluids have you had in the past 24 hours?"     No vomiting   8. HYDRATION: "Any signs of dehydration?" (e.g., dry mouth [not just dry lips], too weak to stand, dizziness, new weight loss) "When did you last urinate?"     Headache; weakness, weight loss of 4 lbs over 3 days; last urine 7am this morning 9. EXPOSURE: "Have you traveled to a foreign country recently?" "Have you been exposed to anyone with diarrhea?" "Could you have eaten any food that was spoiled?"     Pt ate cole slaw , cream pie on Saturday 09/16/17 10. ANTIBIOTIC USE: "Are you taking antibiotics now or have you taken antibiotics in the past 2 months?"      amoxicillin-clavulanate (AUGMENTIN) 875-125 MG tablet 1 tablet q 12 hours; started 09/17/17 11. OTHER SYMPTOMS: "Do you have any other symptoms?" (e.g., fever, blood in stool)       Fever 101.8 on Sunday; temp 98.0 today at 0815; headache 12. PREGNANCY: "Is there any chance you are  pregnant?" "When was your last menstrual period?"       no  Protocols used: DIARRHEA-A-AH

## 2017-09-19 NOTE — Telephone Encounter (Signed)
Pt was seen for an OV today and sxs addressed directly with PCP, Rx not sent per Dr. Milinda Antisower

## 2017-09-19 NOTE — Telephone Encounter (Signed)
Copied from CRM [phone]. Topic: Inquiry >> Sep 19, 2017  8:00 AM Yvonna Alanisobinson, Andra M wrote: Reason for CRM: Patient called requesting to speak with Dr. Milinda Antisower. [facility] and given Amoxicillin-clavulanate (AUGMENTIN) 875-125 MG tablet, which the patient states is too strong and causing her diarrhea. Patient wants a call back from Dr. Milinda Antisower ASAP today. Patient wants to know if Dr. Milinda Antisower would prescribe another medication.       Thank You!!!

## 2017-09-19 NOTE — Patient Instructions (Signed)
Keep up fluid intake Advance diet slowly (BRAT) - bananas/rice/apple sauce and toast   Change antibiotic from augmentin to amoxicillin   Update if not starting to improve in a week or if worsening   Especially if return of fever or any abdominal pain or signs of dehydration

## 2017-09-19 NOTE — Assessment & Plan Note (Signed)
This started before starting augmentin for her OM and worsened with it  Will change that to plain amoxicillin -should lessen any med side effect Unsure if originally food bourne or viral (started 1 h after eating at [place])  No n/v Reassuring exam Disc supportive care- fluids /BRAT diet as tolerated If no imp-consider stool studies Update if not starting to improve in a week or if worsening

## 2017-09-19 NOTE — Telephone Encounter (Signed)
augmentin often causes diarrhea Looks like she is being tx for ear infx  We could subs plain amox   I will pend it-please send to pharmacy of choice  Alert us if problems or if not improving   I took augmentin off of list  Please add to med intol list

## 2017-09-19 NOTE — Assessment & Plan Note (Signed)
Reviewed UC note from 8/4 bilat OM with poss early sinusitis  augmentin worsened the diarrhea she was already having  Will change that to plain amoxicillin  Clinically improved and temp is down  inst to take amox tid Analgesics prn Update if not starting to improve in a week or if worsening

## 2017-09-25 DIAGNOSIS — H40033 Anatomical narrow angle, bilateral: Secondary | ICD-10-CM | POA: Diagnosis not present

## 2017-09-25 DIAGNOSIS — H401131 Primary open-angle glaucoma, bilateral, mild stage: Secondary | ICD-10-CM | POA: Diagnosis not present

## 2017-10-22 ENCOUNTER — Telehealth: Payer: Self-pay | Admitting: Family Medicine

## 2017-10-22 DIAGNOSIS — R739 Hyperglycemia, unspecified: Secondary | ICD-10-CM

## 2017-10-22 DIAGNOSIS — M810 Age-related osteoporosis without current pathological fracture: Secondary | ICD-10-CM

## 2017-10-22 DIAGNOSIS — E559 Vitamin D deficiency, unspecified: Secondary | ICD-10-CM

## 2017-10-22 DIAGNOSIS — I10 Essential (primary) hypertension: Secondary | ICD-10-CM

## 2017-10-22 DIAGNOSIS — E7800 Pure hypercholesterolemia, unspecified: Secondary | ICD-10-CM

## 2017-10-22 NOTE — Telephone Encounter (Signed)
-----   Message from Robert Bellow, LPN sent at 09/22/2798  3:47 PM EDT ----- Regarding: Labs 9/11 Lab orders needed. Thank you.  Insurance:  [facility]

## 2017-10-25 ENCOUNTER — Ambulatory Visit (INDEPENDENT_AMBULATORY_CARE_PROVIDER_SITE_OTHER): Payer: Medicare Other

## 2017-10-25 VITALS — BP 140/90 | HR 83 | Temp 98.3°F | Ht 62.5 in | Wt 128.5 lb

## 2017-10-25 DIAGNOSIS — E7800 Pure hypercholesterolemia, unspecified: Secondary | ICD-10-CM

## 2017-10-25 DIAGNOSIS — E559 Vitamin D deficiency, unspecified: Secondary | ICD-10-CM

## 2017-10-25 DIAGNOSIS — I10 Essential (primary) hypertension: Secondary | ICD-10-CM | POA: Diagnosis not present

## 2017-10-25 DIAGNOSIS — R739 Hyperglycemia, unspecified: Secondary | ICD-10-CM

## 2017-10-25 DIAGNOSIS — Z0000 Encounter for general adult medical examination without abnormal findings: Secondary | ICD-10-CM | POA: Diagnosis not present

## 2017-10-25 LAB — COMPREHENSIVE METABOLIC PANEL
ALBUMIN: 4.2 g/dL (ref 3.5–5.2)
ALK PHOS: 122 U/L — AB (ref 39–117)
ALT: 33 U/L (ref 0–35)
AST: 24 U/L (ref 0–37)
BUN: 10 mg/dL (ref 6–23)
CO2: 32 mEq/L (ref 19–32)
CREATININE: 0.69 mg/dL (ref 0.40–1.20)
Calcium: 10 mg/dL (ref 8.4–10.5)
Chloride: 105 mEq/L (ref 96–112)
GFR: 108.73 mL/min (ref >60.00)
Glucose, Bld: 108 mg/dL — ABNORMAL HIGH (ref 70–99)
POTASSIUM: 4.1 meq/L (ref 3.5–5.1)
SODIUM: 142 meq/L (ref 135–145)
TOTAL PROTEIN: 7.9 g/dL (ref 6.0–8.3)
Total Bilirubin: 0.7 mg/dL (ref 0.2–1.2)

## 2017-10-25 LAB — CBC WITH DIFFERENTIAL/PLATELET
BASOS ABS: 0 10*3/uL (ref 0.0–0.1)
Basophils Relative: 1 % (ref 0.0–3.0)
EOS ABS: 0.2 10*3/uL (ref 0.0–0.7)
Eosinophils Relative: 5.4 % — ABNORMAL HIGH (ref 0.0–5.0)
HCT: 40.2 % (ref 36.0–46.0)
Hemoglobin: 13.6 g/dL (ref 12.0–15.0)
Lymphocytes Relative: 43.6 % (ref 12.0–46.0)
Lymphs Abs: 1.8 10*3/uL (ref 0.7–4.0)
MCHC: 33.8 g/dL (ref 30.0–36.0)
MCV: 91.8 fl (ref 78.0–100.0)
MONO ABS: 0.3 10*3/uL (ref 0.1–1.0)
Monocytes Relative: 8.1 % (ref 3.0–12.0)
Neutro Abs: 1.8 10*3/uL (ref 1.4–7.7)
Neutrophils Relative %: 41.9 % — ABNORMAL LOW (ref 43.0–77.0)
Platelets: 295 10*3/uL (ref 150.0–400.0)
RBC: 4.38 Mil/uL (ref 3.87–5.11)
RDW: 13.3 % (ref 11.5–15.5)
WBC: 4.2 10*3/uL (ref 4.0–10.5)

## 2017-10-25 LAB — LIPID PANEL
CHOLESTEROL: 191 mg/dL (ref 0–200)
HDL: 84.5 mg/dL (ref >39.00)
LDL Cholesterol: 93 mg/dL (ref 0–99)
NonHDL: 106.2
TRIGLYCERIDES: 65 mg/dL (ref 0.0–149.0)
Total CHOL/HDL Ratio: 2
VLDL: 13 mg/dL (ref 0.0–40.0)

## 2017-10-25 LAB — VITAMIN D 25 HYDROXY (VIT D DEFICIENCY, FRACTURES): VITD: 38.59 ng/mL (ref 30.00–100.00)

## 2017-10-25 LAB — TSH: TSH: 4.4 u[IU]/mL (ref 0.35–4.50)

## 2017-10-25 LAB — HEMOGLOBIN A1C: Hgb A1c MFr Bld: 6.3 % (ref 4.6–6.5)

## 2017-10-25 NOTE — Progress Notes (Signed)
Subjective:   Jasmine Rollins is a 67 y.o. female who presents for Medicare Annual (Subsequent) preventive examination.  Review of Systems:  N/A Cardiac Risk Factors include: advanced age (>45men, >54 women);dyslipidemia;hypertension     Objective:     Vitals: BP 140/90 (BP Location: Right Arm, Patient Position: Sitting, Cuff Size: Normal) Comment: no medications taken  Pulse 83   Temp 98.3 F (36.8 C) (Oral)   Ht 5' 2.5" (1.588 m) Comment: no shoes  Wt 128 lb 8 oz (58.3 kg)   SpO2 98%   BMI 23.13 kg/m   Body mass index is 23.13 kg/m.  Advanced Directives 10/25/2017 12/09/2015 10/20/2015 06/11/2015  Does Patient Have a Medical Advance Directive? No No No No  Would patient like information on creating a medical advance directive? Yes (MAU/Ambulatory/Procedural Areas - Information given) Yes - Educational materials given - -    Tobacco Social History   Tobacco Use  Smoking Status Never Smoker  Smokeless Tobacco Never Used  Tobacco Comment   Remote 2nd hand exposure     Counseling given: No Comment: Remote 2nd hand exposure   Clinical Intake:  Pre-visit preparation completed: Yes  Pain : No/denies pain Pain Score: 0-No pain     Nutritional Status: BMI 25 -29 Overweight Nutritional Risks: None Diabetes: No  How often do you need to have someone help you when you read instructions, pamphlets, or other written materials from your doctor or pharmacy?: 1 - Never What is the last grade level you completed in school?: 12th grade + 2 yrs college  Interpreter Needed?: No  Comments: pt lives with spouse Information entered by :: LPinson, LPN  Past Medical History:  Diagnosis Date  . Allergic rhinitis   . Arthritis    hip  . Borderline high cholesterol   . Cataract   . Cough variant asthma   . Diverticulosis of colon   . GERD (gastroesophageal reflux disease)   . HTN (hypertension)   . Hyperglycemia    borderline DM  . OP (osteoporosis)   . Seasonal  allergies   . Sleep apnea    no cpap   Past Surgical History:  Procedure Laterality Date  . COLONOSCOPY    . COLPOSCOPY    . ESOPHAGOGASTRODUODENOSCOPY  10/01   Negative  . ROTATOR CUFF REPAIR     right  . TUBAL LIGATION     Family History  Problem Relation Age of Onset  . Hypertension Father   . Diabetes Father   . Diabetes Brother   . Diabetes Brother   . Cancer Brother        prostate  . Cancer Brother        prostate  . Cancer Brother        prostate  . Cancer Brother        prostate  . Cancer Brother        prostate  . Cancer Brother        prostate  . Colon cancer Neg Hx    Social History   Socioeconomic History  . Marital status: Married    Spouse name: Not on file  . Number of children: 1  . Years of education: Not on file  . Highest education level: Not on file  Occupational History  . Occupation: Clinical biochemist  Social Needs  . Financial resource strain: Not on file  . Food insecurity:    Worry: Not on file    Inability: Not on file  . Transportation  needs:    Medical: Not on file    Non-medical: Not on file  Tobacco Use  . Smoking status: Never Smoker  . Smokeless tobacco: Never Used  . Tobacco comment: Remote 2nd hand exposure  Substance and Sexual Activity  . Alcohol use: No    Alcohol/week: 0.0 standard drinks  . Drug use: No  . Sexual activity: Yes  Lifestyle  . Physical activity:    Days per week: Not on file    Minutes per session: Not on file  . Stress: Not on file  Relationships  . Social connections:    Talks on phone: Not on file    Gets together: Not on file    Attends religious service: Not on file    Active member of club or organization: Not on file    Attends meetings of clubs or organizations: Not on file    Relationship status: Not on file  Other Topics Concern  . Not on file  Social History Narrative  . Not on file    Outpatient Encounter Medications as of 10/25/2017  Medication Sig  . albuterol (PROVENTIL  HFA) 108 (90 Base) MCG/ACT inhaler INHALE 2 PUFFS EVERY 4 HOURS AS NEEDED  . BENICAR HCT 40-12.5 MG tablet TAKE 1 TABLET DAILY  . Calcium Carb-Cholecalciferol (CALCIUM 600+D3) 600-800 MG-UNIT TABS Take by mouth.  Haywood Pao HFA 44 MCG/ACT inhaler USE 2 INHALATIONS TWICE A DAY (RINSE MOUTH AFTER USE)  . fluticasone (FLONASE) 50 MCG/ACT nasal spray Place 2 sprays into both nostrils daily.  Marland Kitchen latanoprost (XALATAN) 0.005 % ophthalmic solution Place 1 drop into both eyes at bedtime.   . Loratadine (CLARITIN PO) Take by mouth.  . montelukast (SINGULAIR) 10 MG tablet TAKE 1 TABLET AT BEDTIME  . ranitidine (ZANTAC) 150 MG tablet TAKE 1 TABLET TWICE A DAY  . [DISCONTINUED] amoxicillin (AMOXIL) 500 MG capsule Take 1 capsule (500 mg total) by mouth 3 (three) times daily.   No facility-administered encounter medications on file as of 10/25/2017.     Activities of Daily Living In your present state of health, do you have any difficulty performing the following activities: 10/25/2017  Hearing? N  Vision? N  Difficulty concentrating or making decisions? N  Walking or climbing stairs? N  Dressing or bathing? N  Doing errands, shopping? N  Preparing Food and eating ? N  Using the Toilet? N  In the past six months, have you accidently leaked urine? N  Do you have problems with loss of bowel control? N  Managing your Medications? N  Managing your Finances? N  Housekeeping or managing your Housekeeping? N  Some recent data might be hidden    Patient Care Team: Tower, Audrie Gallus, MD as PCP - Cherlynn Polo, MD as Consulting Physician (Ophthalmology)    Assessment:   This is a routine wellness examination for Jasmine Rollins.   Hearing Screening   125Hz  250Hz  500Hz  1000Hz  2000Hz  3000Hz  4000Hz  6000Hz  8000Hz   Right ear:   40 40 40  40    Left ear:   40 40 40  40    Vision Screening Comments: Vision exam in Aug 2019 with Dr. Alben Spittle    Exercise Activities and Dietary recommendations Current Exercise  Habits: Home exercise routine, Type of exercise: stretching;treadmill, Time (Minutes): 20(15-30 minutes daily), Frequency (Times/Week): 7, Weekly Exercise (Minutes/Week): 140, Intensity: Mild, Exercise limited by: orthopedic condition(s)(lower back discomfort)  Goals    . Patient Stated     Starting 10/25/2017, I will continue to  take medications as prescribed.        Fall Risk Fall Risk  10/25/2017 01/25/2017 12/09/2015  Falls in the past year? No Yes No  Number falls in past yr: - 1 -  Injury with Fall? - Yes -  Follow up - Falls evaluation completed -   Depression Screen PHQ 2/9 Scores 10/25/2017 01/25/2017 12/09/2015  PHQ - 2 Score 0 0 0  PHQ- 9 Score 0 - -     Cognitive Function MMSE - Mini Mental State Exam 10/25/2017 12/09/2015  Orientation to time 5 5  Orientation to Place 5 5  Registration 3 3  Attention/ Calculation 0 0  Recall 3 3  Language- name 2 objects 0 0  Language- repeat 1 1  Language- follow 3 step command 3 3  Language- read & follow direction 0 0  Write a sentence 0 0  Copy design 0 0  Total score 20 20     PLEASE NOTE: A Mini-Cog screen was completed. Maximum score is 20. A value of 0 denotes this part of Folstein MMSE was not completed or the patient failed this part of the Mini-Cog screening.   Mini-Cog Screening Orientation to Time - Max 5 pts Orientation to Place - Max 5 pts Registration - Max 3 pts Recall - Max 3 pts Language Repeat - Max 1 pts Language Follow 3 Step Command - Max 3 pts     Immunization History  Administered Date(s) Administered  . Influenza Whole 11/12/2008, 11/05/2009  . Influenza, High Dose Seasonal PF 11/13/2014  . Influenza-Unspecified 11/28/2012, 11/15/2013, 11/03/2016  . Pneumococcal Conjugate-13 12/09/2015  . Pneumococcal Polysaccharide-23 03/19/2009, 01/25/2017  . Td 05/15/2001  . Tdap 06/22/2010  . Zoster 08/05/2010    Screening Tests Health Maintenance  Topic Date Due  . INFLUENZA VACCINE  05/16/2018  (Originally 09/14/2017)  . MAMMOGRAM  03/24/2019  . TETANUS/TDAP  06/21/2020  . COLONOSCOPY  06/10/2025  . DEXA SCAN  Completed  . Hepatitis C Screening  Completed  . PNA vac Low Risk Adult  Completed      Plan:     I have personally reviewed, addressed, and noted the following in the patient's chart:  A. Medical and social history B. Use of alcohol, tobacco or illicit drugs  C. Current medications and supplements D. Functional ability and status E.  Nutritional status F.  Physical activity G. Advance directives H. List of other physicians I.  Hospitalizations, surgeries, and ER visits in previous 12 months J.  Vitals K. Screenings to include hearing, vision, cognitive, depression L. Referrals and appointments - none  In addition, I have reviewed and discussed with patient certain preventive protocols, quality metrics, and best practice recommendations. A written personalized care plan for preventive services as well as general preventive health recommendations were provided to patient.  See attached scanned questionnaire for additional information.   Signed,   Randa Evens, MHA, BS, LPN Health Coach

## 2017-10-25 NOTE — Patient Instructions (Addendum)
Jasmine Rollins , Thank you for taking time to come for your Medicare Wellness Visit. I appreciate your ongoing commitment to your health goals. Please review the following plan we discussed and let me know if I can assist you in the future.   These are the goals we discussed: Goals    . Patient Stated     Starting 10/25/2017, I will continue to take medications as prescribed.        This is a list of the screening recommended for you and due dates:  Health Maintenance  Topic Date Due  . Flu Shot  05/16/2018*  . Mammogram  03/24/2019  . Tetanus Vaccine  06/21/2020  . Colon Cancer Screening  06/10/2025  . DEXA scan (bone density measurement)  Completed  .  Hepatitis C: One time screening is recommended by Center for Disease Control  (CDC) for  adults born from [birth year] through 1966.   Completed  . Pneumonia vaccines  Completed  *Topic was postponed. The date shown is not the original due date.   Preventive Care for Adults  A healthy lifestyle and preventive care can promote health and wellness. Preventive health guidelines for adults include the following key practices.  . A routine yearly physical is a good way to check with your health care provider about your health and preventive screening. It is a chance to share any concerns and updates on your health and to receive a thorough exam.  . Visit your dentist for a routine exam and preventive care every 6 months. Brush your teeth twice a day and floss once a day. Good oral hygiene prevents tooth decay and gum disease.  . The frequency of eye exams is based on your age, health, family medical history, use  of contact lenses, and other factors. Follow your health care provider's recommendations for frequency of eye exams.  . Eat a healthy diet. Foods like vegetables, fruits, whole grains, low-fat dairy products, and lean protein foods contain the nutrients you need without too many calories. Decrease your intake of foods high in solid fats,  added sugars, and salt. Eat the right amount of calories for you. Get information about a proper diet from your health care provider, if necessary.  . Regular physical exercise is one of the most important things you can do for your health. Most adults should get at least 150 minutes of moderate-intensity exercise (any activity that increases your heart rate and causes you to sweat) each week. In addition, most adults need muscle-strengthening exercises on 2 or more days a week.  Silver Sneakers may be a benefit available to you. To determine eligibility, you may visit the website: www.silversneakers.com or contact program at 225-535-7546 Mon-Fri between 8AM-8PM.   . Maintain a healthy weight. The body mass index (BMI) is a screening tool to identify possible weight problems. It provides an estimate of body fat based on height and weight. Your health care provider can find your BMI and can help you achieve or maintain a healthy weight.   For adults 20 years and older: ? A BMI below 18.5 is considered underweight. ? A BMI of 18.5 to 24.9 is normal. ? A BMI of 25 to 29.9 is considered overweight. ? A BMI of 30 and above is considered obese.   . Maintain normal blood lipids and cholesterol levels by exercising and minimizing your intake of saturated fat. Eat a balanced diet with plenty of fruit and vegetables. Blood tests for lipids and cholesterol should begin at  age 41 and be repeated every 5 years. If your lipid or cholesterol levels are high, you are over 50, or you are at high risk for heart disease, you may need your cholesterol levels checked more frequently. Ongoing high lipid and cholesterol levels should be treated with medicines if diet and exercise are not working.  . If you smoke, find out from your health care provider how to quit. If you do not use tobacco, please do not start.  . If you choose to drink alcohol, please do not consume more than 2 drinks per day. One drink is  considered to be 12 ounces (355 mL) of beer, 5 ounces (148 mL) of wine, or 1.5 ounces (44 mL) of liquor.  . If you are 61-37 years old, ask your health care provider if you should take aspirin to prevent strokes.  . Use sunscreen. Apply sunscreen liberally and repeatedly throughout the day. You should seek shade when your shadow is shorter than you. Protect yourself by wearing long sleeves, pants, a wide-brimmed hat, and sunglasses year round, whenever you are outdoors.  . Once a month, do a whole body skin exam, using a mirror to look at the skin on your back. Tell your health care provider of new moles, moles that have irregular borders, moles that are larger than a pencil eraser, or moles that have changed in shape or color.

## 2017-10-25 NOTE — Progress Notes (Signed)
PCP notes:   Health maintenance:  Flu vaccine - addressed  Abnormal screenings:   None  Patient concerns:   Lower back discomfort - pt reports she recently purchased a new bed which may be cause of lower back discomfort  Nurse concerns:  None  Next PCP appt:   10/31/17 @ 0830  I reviewed health advisor's note, was available for consultation, and agree with documentation and plan. Roxy Manns MD

## 2017-10-31 ENCOUNTER — Other Ambulatory Visit: Payer: Self-pay | Admitting: Family Medicine

## 2017-10-31 ENCOUNTER — Ambulatory Visit (INDEPENDENT_AMBULATORY_CARE_PROVIDER_SITE_OTHER): Payer: Medicare Other | Admitting: Family Medicine

## 2017-10-31 ENCOUNTER — Encounter: Payer: Self-pay | Admitting: Family Medicine

## 2017-10-31 VITALS — BP 136/88 | HR 78 | Temp 98.2°F | Ht 62.5 in | Wt 127.5 lb

## 2017-10-31 DIAGNOSIS — E2839 Other primary ovarian failure: Secondary | ICD-10-CM

## 2017-10-31 DIAGNOSIS — E559 Vitamin D deficiency, unspecified: Secondary | ICD-10-CM

## 2017-10-31 DIAGNOSIS — Z1231 Encounter for screening mammogram for malignant neoplasm of breast: Secondary | ICD-10-CM

## 2017-10-31 DIAGNOSIS — M810 Age-related osteoporosis without current pathological fracture: Secondary | ICD-10-CM | POA: Diagnosis not present

## 2017-10-31 DIAGNOSIS — Z0000 Encounter for general adult medical examination without abnormal findings: Secondary | ICD-10-CM

## 2017-10-31 DIAGNOSIS — R7303 Prediabetes: Secondary | ICD-10-CM

## 2017-10-31 DIAGNOSIS — I10 Essential (primary) hypertension: Secondary | ICD-10-CM | POA: Diagnosis not present

## 2017-10-31 DIAGNOSIS — E7800 Pure hypercholesterolemia, unspecified: Secondary | ICD-10-CM | POA: Diagnosis not present

## 2017-10-31 MED ORDER — MONTELUKAST SODIUM 10 MG PO TABS: 10.0000 mg | ORAL_TABLET | Freq: Every day | ORAL | 3 refills | Status: DC

## 2017-10-31 MED ORDER — OLMESARTAN MEDOXOMIL-HCTZ 40-12.5 MG PO TABS: 1.0000 | ORAL_TABLET | Freq: Every day | ORAL | 3 refills | Status: DC

## 2017-10-31 MED ORDER — FLUTICASONE PROPIONATE 50 MCG/ACT NA SUSP: 2.0000 | Freq: Every day | NASAL | 3 refills | Status: DC

## 2017-10-31 MED ORDER — FLUTICASONE PROPIONATE HFA 44 MCG/ACT IN AERO: INHALATION_SPRAY | RESPIRATORY_TRACT | 3 refills | Status: DC

## 2017-10-31 NOTE — Assessment & Plan Note (Signed)
Pt is agreeable to dexa this year Disc need for calcium/ vitamin D/ wt bearing exercise and bone density test every 2 y to monitor Disc safety/ fracture risk in detail   [facility]  Enc ca and D

## 2017-10-31 NOTE — Assessment & Plan Note (Signed)
Vitamin D level is therapeutic with current supplementation Disc importance of this to bone and overall health Level in 30s  She will return to original brand since it did dec from last year

## 2017-10-31 NOTE — Assessment & Plan Note (Signed)
bp in fair control at this time  BP Readings from Last 1 Encounters:  10/31/17 136/88   No changes needed Most recent labs reviewed  Disc lifstyle change with low sodium diet and exercise

## 2017-10-31 NOTE — Assessment & Plan Note (Signed)
Lab Results  Component Value Date   HGBA1C 6.3 10/25/2017   disc imp of low glycemic diet and wt loss to prevent DM2

## 2017-10-31 NOTE — Progress Notes (Signed)
Subjective:    Patient ID: Jasmine Rollins, female    DOB: [birth year], 67 y.o.   MRN: [id number]  HPI Here for health maintenance exam and to review chronic medical problems    Has been doing well overall  Has had back problems - low back  Facet dz/deg hips and SI joints  She is doing back exercises   Wt Readings from Last 3 Encounters:  10/31/17 127 lb 8 oz (57.8 kg)  10/25/17 128 lb 8 oz (58.3 kg)  09/19/17 128 lb (58.1 kg)  walking for exercise regularly  Wt is stable  22.95 kg/m   Had amw on 9/11 No gaps or concerns  Flu shot -plans to get later with her husband  Mammogram 7/18 - has to get it planned  Self breast exam -no lumps or changes   Colonoscopy 4/17 nl with 10 y recall   dexa 2/16 stable OP in spine and osteopenia in FN Will do another one this year  Ca and D Falls /fractures -none  D level 38.5 Walks for exercise   zostavax 612  bp is stable today  No cp or palpitations or headaches or edema  No side effects to medicines  BP Readings from Last 3 Encounters:  10/31/17 136/88  10/25/17 140/90  09/19/17 (!) 146/80   on benicar hct   elevated glucose Lab Results  Component Value Date   HGBA1C 6.3 10/25/2017  down from 6.4   Hyperlipidemia Lab Results  Component Value Date   CHOL 191 10/25/2017   CHOL 207 (H) 04/21/2017   CHOL 198 01/25/2017   Lab Results  Component Value Date   HDL 84.50 10/25/2017   HDL 85.20 04/21/2017   HDL 75.50 01/25/2017   Lab Results  Component Value Date   LDLCALC 93 10/25/2017   LDLCALC 108 (H) 04/21/2017   LDLCALC 107 (H) 01/25/2017   Lab Results  Component Value Date   TRIG 65.0 10/25/2017   TRIG 66.0 04/21/2017   TRIG 77.0 01/25/2017   Lab Results  Component Value Date   CHOLHDL 2 10/25/2017   CHOLHDL 2 04/21/2017   CHOLHDL 3 01/25/2017   Lab Results  Component Value Date   LDLDIRECT 106.4 02/26/2013   LDLDIRECT 104.2 09/27/2011   LDLDIRECT 86.7 11/27/2007  LDL is down  Diet is good    HDL is high  Good ratio of 2   Lab Results  Component Value Date   WBC 4.2 10/25/2017   HGB 13.6 10/25/2017   HCT 40.2 10/25/2017   MCV 91.8 10/25/2017   PLT 295.0 10/25/2017   Lab Results  Component Value Date   CREATININE 0.69 10/25/2017   BUN 10 10/25/2017   NA 142 10/25/2017   K 4.1 10/25/2017   CL 105 10/25/2017   CO2 32 10/25/2017   Lab Results  Component Value Date   ALT 33 10/25/2017   AST 24 10/25/2017   ALKPHOS 122 (H) 10/25/2017   BILITOT 0.7 10/25/2017   Lab Results  Component Value Date   TSH 4.40 10/25/2017     Patient Active Problem List   Diagnosis Date Noted  . Vitamin D deficiency 12/06/2015  . GERD (gastroesophageal reflux disease) 06/09/2014  . Encounter for routine gynecological examination 03/03/2014  . Estrogen deficiency 03/03/2014  . Heartburn 03/01/2013  . Gynecological examination 06/22/2010  . Special screening for malignant neoplasms, colon 06/22/2010  . Glaucoma suspect 06/22/2010  . Routine general medical examination at a health care facility 06/03/2010  . Sleep  apnea   . Osteoporosis 05/20/2009  . SLEEP APNEA 10/17/2008  . COUGH VARIANT ASTHMA 07/30/2008  . Leukocytopenia 01/29/2008  . Prediabetes 11/27/2007  . HYPERCHOLESTEROLEMIA 03/21/2007  . Essential hypertension 03/21/2007  . Sinusitis, chronic 03/21/2007  . Allergic rhinitis 03/21/2007  . DIVERTICULOSIS, COLON 03/21/2007   Past Medical History:  Diagnosis Date  . Allergic rhinitis   . Arthritis    hip  . Borderline high cholesterol   . Cataract   . Cough variant asthma   . Diverticulosis of colon   . GERD (gastroesophageal reflux disease)   . HTN (hypertension)   . Hyperglycemia    borderline DM  . OP (osteoporosis)   . Seasonal allergies   . Sleep apnea    no cpap   Past Surgical History:  Procedure Laterality Date  . COLONOSCOPY    . COLPOSCOPY    . ESOPHAGOGASTRODUODENOSCOPY  10/01   Negative  . ROTATOR CUFF REPAIR     right  . TUBAL LIGATION      Social History   Tobacco Use  . Smoking status: Never Smoker  . Smokeless tobacco: Never Used  . Tobacco comment: Remote 2nd hand exposure  Substance Use Topics  . Alcohol use: No    Alcohol/week: 0.0 standard drinks  . Drug use: No   Family History  Problem Relation Age of Onset  . Hypertension Father   . Diabetes Father   . Diabetes Brother   . Diabetes Brother   . Cancer Brother        prostate  . Cancer Brother        prostate  . Cancer Brother        prostate  . Cancer Brother        prostate  . Cancer Brother        prostate  . Cancer Brother        prostate  . Colon cancer Neg Hx    Allergies  Allergen Reactions  . Sulfonamide Derivatives Rash   Current Outpatient Medications on File Prior to Visit  Medication Sig Dispense Refill  . albuterol (PROVENTIL HFA) 108 (90 Base) MCG/ACT inhaler INHALE 2 PUFFS EVERY 4 HOURS AS NEEDED 3 Inhaler 3  . Calcium Carb-Cholecalciferol (CALCIUM 600+D3) 600-800 MG-UNIT TABS Take by mouth.    . latanoprost (XALATAN) 0.005 % ophthalmic solution Place 1 drop into both eyes at bedtime.     . Loratadine (CLARITIN PO) Take by mouth.     No current facility-administered medications on file prior to visit.      Review of Systems  Constitutional: Negative for activity change, appetite change, fatigue, fever and unexpected weight change.  HENT: Negative for congestion, ear pain, rhinorrhea, sinus pressure and sore throat.   Eyes: Negative for pain, redness and visual disturbance.  Respiratory: Positive for cough. Negative for shortness of breath and wheezing.   Cardiovascular: Negative for chest pain and palpitations.  Gastrointestinal: Negative for abdominal pain, blood in stool, constipation and diarrhea.  Endocrine: Negative for polydipsia and polyuria.  Genitourinary: Negative for dysuria, frequency and urgency.  Musculoskeletal: Positive for back pain. Negative for arthralgias and myalgias.  Skin: Negative for pallor and  rash.  Allergic/Immunologic: Negative for environmental allergies.  Neurological: Negative for dizziness, syncope and headaches.  Hematological: Negative for adenopathy. Does not bruise/bleed easily.  Psychiatric/Behavioral: Negative for decreased concentration and dysphoric mood. The patient is not nervous/anxious.        Stressors        Objective:  Physical Exam  Constitutional: She appears well-developed and well-nourished. No distress.  Well appearing   HENT:  Head: Normocephalic and atraumatic.  Right Ear: External ear normal.  Left Ear: External ear normal.  Mouth/Throat: Oropharynx is clear and moist.  Eyes: Pupils are equal, round, and reactive to light. Conjunctivae and EOM are normal. No scleral icterus.  Neck: Normal range of motion. Neck supple. No JVD present. Carotid bruit is not present. No thyromegaly present.  Cardiovascular: Normal rate, regular rhythm, normal heart sounds and intact distal pulses. Exam reveals no gallop.  Pulmonary/Chest: Effort normal and breath sounds normal. No respiratory distress. She has no wheezes. She exhibits no tenderness. No breast tenderness, discharge or bleeding.  Abdominal: Soft. Bowel sounds are normal. She exhibits no distension, no abdominal bruit and no mass. There is no tenderness.  Genitourinary: No breast tenderness, discharge or bleeding.  Genitourinary Comments: Breast exam: No mass, nodules, thickening, tenderness, bulging, retraction, inflamation, nipple discharge or skin changes noted.  No axillary or clavicular LA.      Musculoskeletal: Normal range of motion. She exhibits no edema or tenderness.  No kyphosis   Lymphadenopathy:    She has no cervical adenopathy.  Neurological: She is alert. She has normal reflexes. She displays normal reflexes. No cranial nerve deficit. She exhibits normal muscle tone. Coordination normal.  Skin: Skin is warm and dry. No rash noted. No erythema. No pallor.  Psychiatric: She has a normal  mood and affect. Cognition and memory are normal.  Pleasant  Talks candidly about stress           Assessment & Plan:   Problem List Items Addressed This Visit      Cardiovascular and Mediastinum   Essential hypertension    bp in fair control at this time  BP Readings from Last 1 Encounters:  10/31/17 136/88   No changes needed Most recent labs reviewed  Disc lifstyle change with low sodium diet and exercise        Relevant Medications   olmesartan-hydrochlorothiazide (BENICAR HCT) 40-12.5 MG tablet     Musculoskeletal and Integument   Osteoporosis    Pt is agreeable to dexa this year Disc need for calcium/ vitamin D/ wt bearing exercise and bone density test every 2 y to monitor Disc safety/ fracture risk in detail   Scheduled at [facility]  Enc ca and D         Other   Estrogen deficiency   Relevant Orders   DG Bone Density   HYPERCHOLESTEROLEMIA    Disc goals for lipids and reasons to control them Rev last labs with pt Rev low sat fat diet in detail LDL improved Good HDL        Relevant Medications   olmesartan-hydrochlorothiazide (BENICAR HCT) 40-12.5 MG tablet   Prediabetes    Lab Results  Component Value Date   HGBA1C 6.3 10/25/2017   disc imp of low glycemic diet and wt loss to prevent DM2       Routine general medical examination at a health care facility - Primary    Reviewed health habits including diet and exercise and skin cancer prevention Reviewed appropriate screening tests for age  Also reviewed health mt list, fam hx and immunization status , as well as social and family history   See HPI amw reviewed  Disc stressors at home and health habits Will get flu shot soon  She will schedule her own mammogram  dexa ordered  Labs reviewed  Vitamin D deficiency    Vitamin D level is therapeutic with current supplementation Disc importance of this to bone and overall health Level in 30s  She will return to original brand since it  did dec from last year

## 2017-10-31 NOTE — Progress Notes (Signed)
   Subjective:    Patient ID: Jasmine Rollins, female    DOB: 1950-03-05, 67 y.o.   MRN: [id number]  HPI    Review of Systems     Objective:   Physical Exam        Assessment & Plan:

## 2017-10-31 NOTE — Assessment & Plan Note (Signed)
Disc goals for lipids and reasons to control them Rev last labs with pt Rev low sat fat diet in detail LDL improved Good HDL

## 2017-10-31 NOTE — Assessment & Plan Note (Signed)
Reviewed health habits including diet and exercise and skin cancer prevention Reviewed appropriate screening tests for age  Also reviewed health mt list, fam hx and immunization status , as well as social and family history   See HPI amw reviewed  Disc stressors at home and health habits Will get flu shot soon  She will schedule her own mammogram  dexa ordered  Labs reviewed

## 2017-10-31 NOTE — Patient Instructions (Addendum)
Get your flu shot as planned   Don't forget to schedule your mammogram   Go back to your original vitamin D dose   Continue good diet and exercise to prevent diabetes Try to get most of your carbohydrates from produce (with the exception of white potatoes)  Eat less bread/pasta/rice/snack foods/cereals/sweets and other items from the middle of the grocery store (processed carbs)  We will refer you for a bone density test at check out   Take care of yourself

## 2017-11-06 ENCOUNTER — Ambulatory Visit (INDEPENDENT_AMBULATORY_CARE_PROVIDER_SITE_OTHER)
Admission: RE | Admit: 2017-11-06 | Discharge: 2017-11-06 | Disposition: A | Discharge status: Home / Self Care | Payer: Medicare Other | Source: Ambulatory Visit | Attending: Family Medicine | Admitting: Family Medicine

## 2017-11-06 DIAGNOSIS — E2839 Other primary ovarian failure: Secondary | ICD-10-CM | POA: Diagnosis not present

## 2017-11-27 ENCOUNTER — Ambulatory Visit
Admission: RE | Admit: 2017-11-27 | Discharge: 2017-11-27 | Disposition: A | Discharge status: Home / Self Care | Payer: Medicare Other | Source: Ambulatory Visit | Attending: Family Medicine | Admitting: Family Medicine

## 2017-11-27 DIAGNOSIS — Z1231 Encounter for screening mammogram for malignant neoplasm of breast: Secondary | ICD-10-CM | POA: Diagnosis not present

## 2017-11-28 ENCOUNTER — Ambulatory Visit: Payer: Self-pay | Admitting: *Deleted

## 2017-11-28 NOTE — Telephone Encounter (Signed)
Pt scheduled appt on 11/29/17 at 8 AM with Harlin Heys FNP.

## 2017-11-28 NOTE — Telephone Encounter (Signed)
Pt called with having some dizziness this morning with sinus congestion. She feels like she has had a cold since she received the flu shot at a pharmacy in middle September. Now has the occasional cough, sinus pain and congestion.  Before she uses the nasal washings, the drainage is dark and after she uses it it gets a little lighter. She denies fever.  Has tenderness on the back of her neck. Has taken Tylenol and mucinex without much relief.  Pt did not want to go to another office so appointment scheduled for tomorrow per request. Advise to use humidifier, push fluids and keep check on temperature. Call for any increase in symptoms. Pt voiced understanding.  Reason for Disposition . [1] SEVERE pain AND [2] not improved 2 hours after pain medicine  Answer Assessment - Initial Assessment Questions 1. LOCATION: "Where does it hurt?"      sinsus area and headache 2. ONSET: "When did the sinus pain start?"  (e.g., hours, days)      2 days ago 3. SEVERITY: "How bad is the pain?"   (Scale 1-10; mild, moderate or severe)   - MILD (1-3): doesn't interfere with normal activities    - MODERATE (4-7): interferes with normal activities (e.g., work or school) or awakens from sleep   - SEVERE (8-10): excruciating pain and patient unable to do any normal activities        Pain # up to 8 but today 6 4. RECURRENT SYMPTOM: "Have you ever had sinus problems before?" If so, ask: "When was the last time?" and "What happened that time?"      yes 5. NASAL CONGESTION: "Is the nose blocked?" If so, ask, "Can you open it or must you breathe through the mouth?"     Can not breath thru nose very well this morning 6. NASAL DISCHARGE: "Do you have discharge from your nose?" If so ask, "What color?"     Discharge that has been very dark and sometimes lighter after using the nasal washing 7. FEVER: "Do you have a fever?" If so, ask: "What is it, how was it measured, and when did it start?"      Took temp yesterday was  98.7 8. OTHER SYMPTOMS: "Do you have any other symptoms?" (e.g., sore throat, cough, earache, difficulty breathing)     Soreness and tenderness to touch back of neck, occ cough, stuffiness in heads, some difficulty breathing 9. PREGNANCY: "Is there any chance you are pregnant?" "When was your last menstrual period?"     n/a  Protocols used: SINUS PAIN OR CONGESTION-A-AH

## 2017-11-29 ENCOUNTER — Ambulatory Visit (INDEPENDENT_AMBULATORY_CARE_PROVIDER_SITE_OTHER): Payer: Medicare Other | Admitting: Family Medicine

## 2017-11-29 ENCOUNTER — Encounter: Payer: Self-pay | Admitting: Family Medicine

## 2017-11-29 VITALS — BP 140/86 | HR 81 | Temp 98.3°F | Ht 62.5 in | Wt 129.1 lb

## 2017-11-29 DIAGNOSIS — J3489 Other specified disorders of nose and nasal sinuses: Secondary | ICD-10-CM | POA: Diagnosis not present

## 2017-11-29 DIAGNOSIS — R0981 Nasal congestion: Secondary | ICD-10-CM

## 2017-11-29 NOTE — Progress Notes (Signed)
Subjective:    Patient ID: Jasmine Rollins, female    DOB: 06/15/1950, 67 y.o.   MRN: [id number]  HPI This is a 67 yo female who presents today with "sniffles" since flu shot. She had a flu shot 11/03/17. Has been taking mucinex, advil, essential oils, Neti pot. Some ear pressure, facial pressure and nasal drainage. Drainage dark yellow to clear. Symptoms were recently getting better until yesterday when felt dizzy x 1. She took Advil cold and sinus and feels better. No fever. Some cough, post nasal drainage. Takes singulair, flonase and claritin. No SOB or wheeze. History of allergic rhinitis. Husband with similar symptoms.    Past Medical History:  Diagnosis Date  . Allergic rhinitis   . Arthritis    hip  . Borderline high cholesterol   . Cataract   . Cough variant asthma   . Diverticulosis of colon   . GERD (gastroesophageal reflux disease)   . HTN (hypertension)   . Hyperglycemia    borderline DM  . OP (osteoporosis)   . Seasonal allergies   . Sleep apnea    no cpap   Past Surgical History:  Procedure Laterality Date  . COLONOSCOPY    . COLPOSCOPY    . ESOPHAGOGASTRODUODENOSCOPY  10/01   Negative  . ROTATOR CUFF REPAIR     right  . TUBAL LIGATION     Family History  Problem Relation Age of Onset  . Hypertension Father   . Diabetes Father   . Diabetes Brother   . Diabetes Brother   . Cancer Brother        prostate  . Cancer Brother        prostate  . Cancer Brother        prostate  . Cancer Brother        prostate  . Cancer Brother        prostate  . Cancer Brother        prostate  . Breast cancer Maternal Aunt   . Colon cancer Neg Hx    Social History   Tobacco Use  . Smoking status: Never Smoker  . Smokeless tobacco: Never Used  . Tobacco comment: Remote 2nd hand exposure  Substance Use Topics  . Alcohol use: No    Alcohol/week: 0.0 standard drinks  . Drug use: No      Review of Systems Per HPI    Objective:   Physical Exam    Constitutional: She appears well-developed and well-nourished. No distress.  HENT:  Head: Normocephalic and atraumatic.  Right Ear: Tympanic membrane, external ear and ear canal normal.  Left Ear: Tympanic membrane, external ear and ear canal normal.  Nose: Mucosal edema present. Right sinus exhibits maxillary sinus tenderness. Right sinus exhibits no frontal sinus tenderness. Left sinus exhibits maxillary sinus tenderness (greater than right). Left sinus exhibits no frontal sinus tenderness.  Mouth/Throat: Uvula is midline and mucous membranes are normal. Posterior oropharyngeal erythema present. No oropharyngeal exudate, posterior oropharyngeal edema or tonsillar abscesses.  Eyes: Conjunctivae are normal.  Neck: Normal range of motion. Neck supple.  Cardiovascular: Normal rate, regular rhythm and normal heart sounds.  Pulmonary/Chest: Effort normal and breath sounds normal.  Lymphadenopathy:    She has no cervical adenopathy.  Skin: Skin is warm and dry. She is not diaphoretic.  Psychiatric: She has a normal mood and affect. Her behavior is normal. Judgment and thought content normal.  Vitals reviewed.     BP 140/86 (BP Location: Left Arm, Patient  Position: Sitting, Cuff Size: Normal)   Pulse 81   Temp 98.3 F (36.8 C) (Oral)   Ht 5' 2.5" (1.588 m)   Wt 129 lb 1.9 oz (58.6 kg)   SpO2 99%   BMI 23.24 kg/m      Assessment & Plan:  1. Nasal congestion with rhinorrhea - allergies vs viral illness, feeling better since yesterday afternoon - continue symptomatic treatments - she was instructed to let me know if she does not continue to feel better in next 3-5 days and will consider antibiotic   Olean Ree, FNP-BC  [facility], [facility]  11/29/2017 8:28 AM

## 2017-11-29 NOTE — Patient Instructions (Signed)
Good to see you today  Continue the Mucinex, saline rinse, Advil cold and good fluid intake  If not better in a couple of days, please let me know and we can discuss an antibiotic

## 2017-12-25 ENCOUNTER — Other Ambulatory Visit: Payer: Medicare Other

## 2018-05-31 DIAGNOSIS — H401131 Primary open-angle glaucoma, bilateral, mild stage: Secondary | ICD-10-CM | POA: Diagnosis not present

## 2018-05-31 DIAGNOSIS — H40033 Anatomical narrow angle, bilateral: Secondary | ICD-10-CM | POA: Diagnosis not present

## 2018-05-31 DIAGNOSIS — H25013 Cortical age-related cataract, bilateral: Secondary | ICD-10-CM | POA: Diagnosis not present

## 2018-05-31 DIAGNOSIS — H2513 Age-related nuclear cataract, bilateral: Secondary | ICD-10-CM | POA: Diagnosis not present

## 2018-05-31 LAB — HM DIABETES EYE EXAM

## 2018-06-08 ENCOUNTER — Other Ambulatory Visit: Payer: Self-pay

## 2018-06-08 ENCOUNTER — Ambulatory Visit (INDEPENDENT_AMBULATORY_CARE_PROVIDER_SITE_OTHER): Payer: Medicare Other | Admitting: Family Medicine

## 2018-06-08 ENCOUNTER — Encounter: Payer: Self-pay | Admitting: Family Medicine

## 2018-06-08 VITALS — BP 129/83 | HR 97 | Temp 98.5°F | Ht 62.5 in | Wt 132.6 lb

## 2018-06-08 DIAGNOSIS — H9201 Otalgia, right ear: Secondary | ICD-10-CM

## 2018-06-08 DIAGNOSIS — J301 Allergic rhinitis due to pollen: Secondary | ICD-10-CM

## 2018-06-08 MED ORDER — AMOXICILLIN 500 MG PO CAPS: 500.0000 mg | ORAL_CAPSULE | Freq: Two times a day (BID) | ORAL | 0 refills | Status: DC

## 2018-06-08 NOTE — Assessment & Plan Note (Signed)
With some tinnitus and feeling of fullness in pt with seasonal all rhinitis and congestion  Given hx -likely ETD Enc to continue current meds and inc flonase dose to bid for a week If worse ear pain -sent amox to fill and start as directed  Avoid pollen when able Watch temp and update if fever  Also watch for rash/swelling/dizziness or other symptoms  Update if not starting to improve in a week or if worsening

## 2018-06-08 NOTE — Progress Notes (Signed)
Virtual Visit via Video Note  I connected with Jasmine Rollins on 06/08/18 at  8:30 AM EDT by a video enabled telemedicine application and verified that I am speaking with the correct person using two identifiers.  the patient is in her home today  I am in my office at [facility] [place]  I discussed the limitations of evaluation and management by telemedicine and the availability of in person appointments. The patient expressed understanding and agreed to proceed.  History of Present Illness: She has a ringing ear for 3 days right (when she laid down) -right ear Not hurting until this am  It comes and goes /dull in nature  Throat is not hurting  Not clearing throat more than usual  No rash  No face swelling or redness of face  No dental pain - had a little sensitivity one time   Perhaps elevated temp-not sure 99 in one ear-normal in the other ear   She feels ok /not under the weather   No runny nose  Gets congested at times (mucinex and allergy pill generic zyrtec and flonase) Mucous has been clear  No cough or wheezing more than usual (uses her inhaler)   Has not used pain relievers   Review of Systems  Constitutional: Negative for chills, fever and malaise/fatigue.  HENT: Positive for congestion, ear pain and tinnitus. Negative for ear discharge, hearing loss, sinus pain and sore throat.   Eyes: Negative for blurred vision, discharge and redness.  Respiratory: Negative for cough, shortness of breath and stridor.   Cardiovascular: Negative for palpitations.  Gastrointestinal: Negative for nausea.  Skin: Negative for itching and rash.  Neurological: Negative for dizziness and headaches.  Endo/Heme/Allergies: Positive for environmental allergies.    Patient Active Problem List   Diagnosis Date Noted  . Right ear pain 06/08/2018  . Vitamin D deficiency 12/06/2015  . GERD (gastroesophageal reflux disease) 06/09/2014  . Encounter for routine gynecological examination 03/03/2014   . Estrogen deficiency 03/03/2014  . Heartburn 03/01/2013  . Gynecological examination 06/22/2010  . Special screening for malignant neoplasms, colon 06/22/2010  . Glaucoma suspect 06/22/2010  . Routine general medical examination at a health care facility 06/03/2010  . Sleep apnea   . Osteoporosis 05/20/2009  . SLEEP APNEA 10/17/2008  . COUGH VARIANT ASTHMA 07/30/2008  . Leukocytopenia 01/29/2008  . Prediabetes 11/27/2007  . HYPERCHOLESTEROLEMIA 03/21/2007  . Essential hypertension 03/21/2007  . Sinusitis, chronic 03/21/2007  . Allergic rhinitis 03/21/2007  . DIVERTICULOSIS, COLON 03/21/2007   Past Medical History:  Diagnosis Date  . Allergic rhinitis   . Arthritis    hip  . Borderline high cholesterol   . Cataract   . Cough variant asthma   . Diverticulosis of colon   . GERD (gastroesophageal reflux disease)   . HTN (hypertension)   . Hyperglycemia    borderline DM  . OP (osteoporosis)   . Seasonal allergies   . Sleep apnea    no cpap   Past Surgical History:  Procedure Laterality Date  . COLONOSCOPY    . COLPOSCOPY    . ESOPHAGOGASTRODUODENOSCOPY  10/01   Negative  . ROTATOR CUFF REPAIR     right  . TUBAL LIGATION     Social History   Tobacco Use  . Smoking status: Never Smoker  . Smokeless tobacco: Never Used  . Tobacco comment: Remote 2nd hand exposure  Substance Use Topics  . Alcohol use: No    Alcohol/week: 0.0 standard drinks  . Drug use:  No   Family History  Problem Relation Age of Onset  . Hypertension Father   . Diabetes Father   . Diabetes Brother   . Diabetes Brother   . Cancer Brother        prostate  . Cancer Brother        prostate  . Cancer Brother        prostate  . Cancer Brother        prostate  . Cancer Brother        prostate  . Cancer Brother        prostate  . Breast cancer Maternal Aunt   . Colon cancer Neg Hx    Allergies  Allergen Reactions  . Sulfonamide Derivatives Rash   Current Outpatient Medications on  File Prior to Visit  Medication Sig Dispense Refill  . albuterol (PROVENTIL HFA) 108 (90 Base) MCG/ACT inhaler INHALE 2 PUFFS EVERY 4 HOURS AS NEEDED 3 Inhaler 3  . Calcium Carb-Cholecalciferol (CALCIUM 600+D3) 600-800 MG-UNIT TABS Take by mouth.    . fluticasone (FLONASE) 50 MCG/ACT nasal spray Place 2 sprays into both nostrils daily. 48 g 3  . fluticasone (FLOVENT HFA) 44 MCG/ACT inhaler USE 2 INHALATIONS TWICE A DAY (RINSE MOUTH AFTER USE) 31.8 g 3  . latanoprost (XALATAN) 0.005 % ophthalmic solution Place 1 drop into both eyes at bedtime.     . montelukast (SINGULAIR) 10 MG tablet Take 1 tablet (10 mg total) by mouth at bedtime. 90 tablet 3  . olmesartan-hydrochlorothiazide (BENICAR HCT) 40-12.5 MG tablet Take 1 tablet by mouth daily. 90 tablet 3   No current facility-administered medications on file prior to visit.     Observations/Objective: Pt appears well  No facial swelling or asymmetry  Not coughing or sniffling or clearing throat  No wheeze or sob with speech  Is able to hear me with no problems  Pleasant with nl affect   Assessment and Plan: Problem List Items Addressed This Visit      Respiratory   Allergic rhinitis    Some ETD symptoms  Continue the generic cetrizine Also flonase  Avoid pollen when able        Other   Right ear pain - Primary    With some tinnitus and feeling of fullness in pt with seasonal all rhinitis and congestion  Given hx -likely ETD Enc to continue current meds and inc flonase dose to bid for a week If worse ear pain -sent amox to fill and start as directed  Avoid pollen when able Watch temp and update if fever  Also watch for rash/swelling/dizziness or other symptoms  Update if not starting to improve in a week or if worsening           Follow Up Instructions: Increase your flonase to twice daily for a week  If ear pain worsens- fill and take amoxicillin and try ibuprofen with food otc conitnue allergy medicines  Watch for  swelling or rash or any other symptoms  If increased temp please let us know  Update if not starting to improve in a week or if worsening      I discussed the assessment and treatment plan with the patient. The patient was provided an opportunity to ask questions and all were answered. The patient agreed with the plan and demonstrated an understanding of the instructions.   The patient was advised to call back or seek an in-person evaluation if the symptoms worsen or if the condition fails to  improve as anticipated.     Loura Pardon, MD

## 2018-06-08 NOTE — Assessment & Plan Note (Signed)
Some ETD symptoms  Continue the generic cetrizine Also flonase  Avoid pollen when able

## 2018-06-12 ENCOUNTER — Encounter: Payer: Self-pay | Admitting: Family Medicine

## 2018-06-20 ENCOUNTER — Encounter: Payer: Self-pay | Admitting: Family Medicine

## 2018-06-22 ENCOUNTER — Encounter: Payer: Self-pay | Admitting: Family Medicine

## 2018-07-25 ENCOUNTER — Other Ambulatory Visit: Payer: Self-pay | Admitting: Pediatric Intensive Care

## 2018-07-25 DIAGNOSIS — Z20822 Contact with and (suspected) exposure to covid-19: Secondary | ICD-10-CM

## 2018-07-26 LAB — NOVEL CORONAVIRUS, NAA: SARS-CoV-2, NAA: NOT DETECTED

## 2018-07-27 ENCOUNTER — Telehealth: Payer: Self-pay | Admitting: Family Medicine

## 2018-07-27 NOTE — Telephone Encounter (Signed)
Pt returned call for COVID19 results. Advised pt of negative result/not detected.

## 2018-09-28 ENCOUNTER — Ambulatory Visit (INDEPENDENT_AMBULATORY_CARE_PROVIDER_SITE_OTHER): Payer: Medicare Other | Admitting: Family Medicine

## 2018-09-28 ENCOUNTER — Encounter: Payer: Self-pay | Admitting: Family Medicine

## 2018-09-28 VITALS — Temp 98.4°F

## 2018-09-28 DIAGNOSIS — J45991 Cough variant asthma: Secondary | ICD-10-CM

## 2018-09-28 DIAGNOSIS — J329 Chronic sinusitis, unspecified: Secondary | ICD-10-CM | POA: Diagnosis not present

## 2018-09-28 DIAGNOSIS — J301 Allergic rhinitis due to pollen: Secondary | ICD-10-CM | POA: Diagnosis not present

## 2018-09-28 MED ORDER — BENZONATATE 200 MG PO CAPS: 200.0000 mg | ORAL_CAPSULE | Freq: Three times a day (TID) | ORAL | 1 refills | Status: DC | PRN

## 2018-09-28 MED ORDER — DOXYCYCLINE HYCLATE 100 MG PO TABS: 100.0000 mg | ORAL_TABLET | Freq: Two times a day (BID) | ORAL | 0 refills | Status: DC

## 2018-09-28 MED ORDER — PREDNISONE 10 MG PO TABS: ORAL_TABLET | ORAL | 0 refills | Status: DC

## 2018-09-28 MED ORDER — ALBUTEROL SULFATE HFA 108 (90 BASE) MCG/ACT IN AERS: INHALATION_SPRAY | RESPIRATORY_TRACT | 3 refills | Status: DC

## 2018-09-28 NOTE — Progress Notes (Signed)
Virtual Visit via Video Note  I connected with Jasmine Rollins on 09/28/18 at 12:00 PM EDT by a video enabled telemedicine application and verified that I am speaking with the correct person using two identifiers.  Location: Patient: home Provider: office    I discussed the limitations of evaluation and management by telemedicine and the availability of in person appointments. The patient expressed understanding and agreed to proceed.  History of Present Illness: Pt presents for cough/congestion with allergies (seasonal worse)   Coughing for 3 weeks  Productive of mucous - all clear  Also wheezing -for about a week   (worse when she lies down)  A little hoarse and congested   Nasal d/c- had color in it yesterday    Taking temp frequently  98 average  Cannot smell very much (this usually happens with allergy issues)   She uses sinus lavage   She was tested for covid 6/10 -negative  This was before the cough started  Ears still ring No ear pain  No sinus pain -must congested  No headache   Cough is worse with outdoor  Worse when air blows on her   Taking mucinex  Drinking lots of water  claritin 10 mg daily  flovent-bid  Albuterol-using prn with wheezing  flonase- bid  singulair  Patient Active Problem List   Diagnosis Date Noted  . Right ear pain 06/08/2018  . Vitamin D deficiency 12/06/2015  . GERD (gastroesophageal reflux disease) 06/09/2014  . Encounter for routine gynecological examination 03/03/2014  . Estrogen deficiency 03/03/2014  . Heartburn 03/01/2013  . Gynecological examination 06/22/2010  . Special screening for malignant neoplasms, colon 06/22/2010  . Glaucoma suspect 06/22/2010  . Routine general medical examination at a health care facility 06/03/2010  . Sleep apnea   . Osteoporosis 05/20/2009  . SLEEP APNEA 10/17/2008  . COUGH VARIANT ASTHMA 07/30/2008  . Leukocytopenia 01/29/2008  . Prediabetes 11/27/2007  . HYPERCHOLESTEROLEMIA 03/21/2007   . Essential hypertension 03/21/2007  . Sinusitis, chronic 03/21/2007  . Allergic rhinitis 03/21/2007  . DIVERTICULOSIS, COLON 03/21/2007   Past Medical History:  Diagnosis Date  . Allergic rhinitis   . Arthritis    hip  . Borderline high cholesterol   . Cataract   . Cough variant asthma   . Diverticulosis of colon   . GERD (gastroesophageal reflux disease)   . HTN (hypertension)   . Hyperglycemia    borderline DM  . OP (osteoporosis)   . Seasonal allergies   . Sleep apnea    no cpap   Past Surgical History:  Procedure Laterality Date  . COLONOSCOPY    . COLPOSCOPY    . ESOPHAGOGASTRODUODENOSCOPY  10/01   Negative  . ROTATOR CUFF REPAIR     right  . TUBAL LIGATION     Social History   Tobacco Use  . Smoking status: Never Smoker  . Smokeless tobacco: Never Used  . Tobacco comment: Remote 2nd hand exposure  Substance Use Topics  . Alcohol use: No    Alcohol/week: 0.0 standard drinks  . Drug use: No   Family History  Problem Relation Age of Onset  . Hypertension Father   . Diabetes Father   . Diabetes Brother   . Diabetes Brother   . Cancer Brother        prostate  . Cancer Brother        prostate  . Cancer Brother        prostate  . Cancer Brother  prostate  . Cancer Brother        prostate  . Cancer Brother        prostate  . Breast cancer Maternal Aunt   . Colon cancer Neg Hx    Allergies  Allergen Reactions  . Augmentin [Amoxicillin-Pot Clavulanate]     GI upset   . Sulfonamide Derivatives Rash   Current Outpatient Medications on File Prior to Visit  Medication Sig Dispense Refill  . Calcium Carb-Cholecalciferol (CALCIUM 600+D3) 600-800 MG-UNIT TABS Take by mouth.    . fluticasone (FLONASE) 50 MCG/ACT nasal spray Place 2 sprays into both nostrils daily. 48 g 3  . fluticasone (FLOVENT HFA) 44 MCG/ACT inhaler USE 2 INHALATIONS TWICE A DAY (RINSE MOUTH AFTER USE) 31.8 g 3  . latanoprost (XALATAN) 0.005 % ophthalmic solution Place 1 drop  into both eyes at bedtime.     . montelukast (SINGULAIR) 10 MG tablet Take 1 tablet (10 mg total) by mouth at bedtime. 90 tablet 3  . olmesartan-hydrochlorothiazide (BENICAR HCT) 40-12.5 MG tablet Take 1 tablet by mouth daily. 90 tablet 3   No current facility-administered medications on file prior to visit.    Review of Systems  Constitutional: Negative for chills, fever and malaise/fatigue.  HENT: Positive for congestion and tinnitus. Negative for ear discharge, ear pain, sinus pain and sore throat.   Eyes: Negative for blurred vision, discharge and redness.  Respiratory: Positive for cough, sputum production and wheezing. Negative for hemoptysis, shortness of breath and stridor.   Cardiovascular: Negative for chest pain, palpitations and leg swelling.  Gastrointestinal: Negative for abdominal pain, diarrhea, heartburn, nausea and vomiting.  Skin: Negative for rash.  Neurological: Negative for dizziness and headaches.    Observations/Objective: Patient appears well, in no distress Weight is baseline  No facial swelling or asymmetry Voice is slightly hoarse and congested sounding  No obvious tremor or mobility impairment Moving neck and UEs normally Able to hear the call well  No cough or shortness of breath during interview (occ clears throat) Talkative and mentally sharp with no cognitive changes No skin changes on face or neck , no rash or pallor Affect is normal    Assessment and Plan: Problem List Items Addressed This Visit      Respiratory   Sinusitis, chronic    Suspect inf currently with sinus pressure/colored nasal d/c and cough  tx with doxycycline and prednisone  Optimize allergy tx  Update if not starting to improve in a week or if worsening   Plan to re check covid test if not imp      Relevant Medications   doxycycline (VIBRA-TABS) 100 MG tablet   predniSONE (DELTASONE) 10 MG tablet   benzonatate (TESSALON) 200 MG capsule   Allergic rhinitis - Primary     Suspect this has lead to a sinus infection  Will cover with doxycycline Rev current medicines-will stay on all allergy meds at this time        COUGH VARIANT ASTHMA    Worse lately  Also s/s of sinusitis tx with prednisone taper 30 mg-disc side effects  Also doxycycline  Fluids  Continue flovent and albuterol  Update if not starting to improve in a week or if worsening   Consider re testing for covid if no imp      Relevant Medications   albuterol (PROVENTIL HFA) 108 (90 Base) MCG/ACT inhaler   predniSONE (DELTASONE) 10 MG tablet       Follow Up Instructions: I think your chronic sinusitis  and cough variant asthma may be acting up  Take the prednisone and doxycycline as directed  mucinex dm is good for cough and congestion Tessalon for cough  Albuterol for rescue (wheezing)  Update if not starting to improve in a week or if worsening   We can test you for covid at any time if needed as well  Isolate yourself until you feel better    I discussed the assessment and treatment plan with the patient. The patient was provided an opportunity to ask questions and all were answered. The patient agreed with the plan and demonstrated an understanding of the instructions.   The patient was advised to call back or seek an in-person evaluation if the symptoms worsen or if the condition fails to improve as anticipated.     Loura Pardon, MD

## 2018-09-28 NOTE — Assessment & Plan Note (Signed)
Suspect this has lead to a sinus infection  Will cover with doxycycline Rev current medicines-will stay on all allergy meds at this time

## 2018-09-28 NOTE — Assessment & Plan Note (Signed)
Worse lately  Also s/s of sinusitis tx with prednisone taper 30 mg-disc side effects  Also doxycycline  Fluids  Continue flovent and albuterol  Update if not starting to improve in a week or if worsening   Consider re testing for covid if no imp

## 2018-09-28 NOTE — Assessment & Plan Note (Signed)
Suspect inf currently with sinus pressure/colored nasal d/c and cough  tx with doxycycline and prednisone  Optimize allergy tx  Update if not starting to improve in a week or if worsening   Plan to re check covid test if not imp

## 2018-09-28 NOTE — Patient Instructions (Signed)
I think your chronic sinusitis and cough variant asthma may be acting up  Take the prednisone and doxycycline as directed  mucinex dm is good for cough and congestion Tessalon for cough  Albuterol for rescue (wheezing)  Update if not starting to improve in a week or if worsening   We can test you for covid at any time if needed as well  Isolate yourself until you feel better

## 2018-10-02 ENCOUNTER — Other Ambulatory Visit: Payer: Self-pay | Admitting: *Deleted

## 2018-10-02 MED ORDER — ALBUTEROL SULFATE HFA 108 (90 BASE) MCG/ACT IN AERS: INHALATION_SPRAY | RESPIRATORY_TRACT | 3 refills | Status: DC

## 2018-10-02 NOTE — Telephone Encounter (Signed)
Received fax requesting inhaler be sent to mail order pharmacy, done

## 2018-10-08 ENCOUNTER — Encounter: Payer: Self-pay | Admitting: Family Medicine

## 2018-10-08 DIAGNOSIS — R05 Cough: Secondary | ICD-10-CM

## 2018-10-08 DIAGNOSIS — R059 Cough, unspecified: Secondary | ICD-10-CM

## 2018-10-09 DIAGNOSIS — R05 Cough: Secondary | ICD-10-CM | POA: Insufficient documentation

## 2018-10-09 DIAGNOSIS — R059 Cough, unspecified: Secondary | ICD-10-CM | POA: Insufficient documentation

## 2018-10-09 NOTE — Telephone Encounter (Signed)
Please call pt and see where she wants to go for covid testing  I ordered it  thanks

## 2018-10-10 ENCOUNTER — Other Ambulatory Visit: Payer: Self-pay

## 2018-10-10 DIAGNOSIS — Z20822 Contact with and (suspected) exposure to covid-19: Secondary | ICD-10-CM

## 2018-10-10 NOTE — Telephone Encounter (Signed)
Testing info given to pt

## 2018-10-11 LAB — NOVEL CORONAVIRUS, NAA: SARS-CoV-2, NAA: NOT DETECTED

## 2018-11-05 ENCOUNTER — Telehealth: Payer: Self-pay | Admitting: Family Medicine

## 2018-11-05 DIAGNOSIS — E559 Vitamin D deficiency, unspecified: Secondary | ICD-10-CM

## 2018-11-05 DIAGNOSIS — R7303 Prediabetes: Secondary | ICD-10-CM

## 2018-11-05 DIAGNOSIS — E7800 Pure hypercholesterolemia, unspecified: Secondary | ICD-10-CM

## 2018-11-05 DIAGNOSIS — I10 Essential (primary) hypertension: Secondary | ICD-10-CM

## 2018-11-05 DIAGNOSIS — M810 Age-related osteoporosis without current pathological fracture: Secondary | ICD-10-CM

## 2018-11-05 NOTE — Telephone Encounter (Signed)
-----   Message from Ellamae Sia sent at 11/01/2018 12:42 PM EDT ----- Regarding: Lab orders for Tuesday, 9.22.20 Patient is scheduled for CPX labs, please order future labs, Thanks , Karna Christmas

## 2018-11-06 ENCOUNTER — Ambulatory Visit (INDEPENDENT_AMBULATORY_CARE_PROVIDER_SITE_OTHER): Payer: Medicare Other

## 2018-11-06 ENCOUNTER — Ambulatory Visit: Payer: Medicare Other

## 2018-11-06 ENCOUNTER — Other Ambulatory Visit (INDEPENDENT_AMBULATORY_CARE_PROVIDER_SITE_OTHER): Payer: Medicare Other

## 2018-11-06 ENCOUNTER — Other Ambulatory Visit: Payer: Self-pay

## 2018-11-06 DIAGNOSIS — E7800 Pure hypercholesterolemia, unspecified: Secondary | ICD-10-CM | POA: Diagnosis not present

## 2018-11-06 DIAGNOSIS — I10 Essential (primary) hypertension: Secondary | ICD-10-CM

## 2018-11-06 DIAGNOSIS — R7303 Prediabetes: Secondary | ICD-10-CM | POA: Diagnosis not present

## 2018-11-06 DIAGNOSIS — Z0000 Encounter for general adult medical examination without abnormal findings: Secondary | ICD-10-CM | POA: Diagnosis not present

## 2018-11-06 DIAGNOSIS — E559 Vitamin D deficiency, unspecified: Secondary | ICD-10-CM | POA: Diagnosis not present

## 2018-11-06 LAB — COMPREHENSIVE METABOLIC PANEL
ALT: 16 U/L (ref 0–35)
AST: 20 U/L (ref 0–37)
Albumin: 4.3 g/dL (ref 3.5–5.2)
Alkaline Phosphatase: 84 U/L (ref 39–117)
BUN: 10 mg/dL (ref 6–23)
CO2: 32 mEq/L (ref 19–32)
Calcium: 10.4 mg/dL (ref 8.4–10.5)
Chloride: 104 mEq/L (ref 96–112)
Creatinine, Ser: 0.67 mg/dL (ref 0.40–1.20)
GFR: 105.51 mL/min (ref >60.00)
Glucose, Bld: 112 mg/dL — ABNORMAL HIGH (ref 70–99)
Potassium: 4.7 mEq/L (ref 3.5–5.1)
Sodium: 142 mEq/L (ref 135–145)
Total Bilirubin: 0.8 mg/dL (ref 0.2–1.2)
Total Protein: 7.5 g/dL (ref 6.0–8.3)

## 2018-11-06 LAB — LIPID PANEL
Cholesterol: 206 mg/dL — ABNORMAL HIGH (ref 0–200)
HDL: 75.5 mg/dL (ref >39.00)
LDL Cholesterol: 115 mg/dL — ABNORMAL HIGH (ref 0–99)
NonHDL: 130.03
Total CHOL/HDL Ratio: 3
Triglycerides: 73 mg/dL (ref 0.0–149.0)
VLDL: 14.6 mg/dL (ref 0.0–40.0)

## 2018-11-06 LAB — CBC WITH DIFFERENTIAL/PLATELET
Basophils Absolute: 0 10*3/uL (ref 0.0–0.1)
Basophils Relative: 1 % (ref 0.0–3.0)
Eosinophils Absolute: 0.2 10*3/uL (ref 0.0–0.7)
Eosinophils Relative: 5.6 % — ABNORMAL HIGH (ref 0.0–5.0)
HCT: 42.4 % (ref 36.0–46.0)
Hemoglobin: 14.1 g/dL (ref 12.0–15.0)
Lymphocytes Relative: 48.6 % — ABNORMAL HIGH (ref 12.0–46.0)
Lymphs Abs: 1.8 10*3/uL (ref 0.7–4.0)
MCHC: 33.3 g/dL (ref 30.0–36.0)
MCV: 94.2 fl (ref 78.0–100.0)
Monocytes Absolute: 0.3 10*3/uL (ref 0.1–1.0)
Monocytes Relative: 7.2 % (ref 3.0–12.0)
Neutro Abs: 1.4 10*3/uL (ref 1.4–7.7)
Neutrophils Relative %: 37.6 % — ABNORMAL LOW (ref 43.0–77.0)
Platelets: 277 10*3/uL (ref 150.0–400.0)
RBC: 4.5 Mil/uL (ref 3.87–5.11)
RDW: 13.5 % (ref 11.5–15.5)
WBC: 3.8 10*3/uL — ABNORMAL LOW (ref 4.0–10.5)

## 2018-11-06 LAB — HEMOGLOBIN A1C: Hgb A1c MFr Bld: 6.7 % — ABNORMAL HIGH (ref 4.6–6.5)

## 2018-11-06 LAB — TSH: TSH: 4.5 u[IU]/mL (ref 0.35–4.50)

## 2018-11-06 LAB — VITAMIN D 25 HYDROXY (VIT D DEFICIENCY, FRACTURES): VITD: 54.51 ng/mL (ref 30.00–100.00)

## 2018-11-06 NOTE — Progress Notes (Signed)
PCP notes: none  Health Maintenance: Patient wants to discuss receiving the Shingrix vaccine    Abnormal Screenings: none    Patient concerns: Patient states that she has a ringing in her right ear and she would like to discuss this with the doctor.     Nurse concerns: none    Next PCP appt.: 11/09/2018 @ 9:30 am

## 2018-11-06 NOTE — Progress Notes (Signed)
Subjective:   Jasmine Rollins is a 68 y.o. female who presents for Medicare Annual (Subsequent) preventive examination.  Review of Systems:    This visit is being conducted through telemedicine due to the COVID-19 pandemic. This patient has given me verbal consent via doximity to conduct this visit, patient states they are participating from their home address. Some vital signs may be absent or patient reported.    Patient identification: identified by name, DOB, and current address  Cardiac Risk Factors include: advanced age (>45men, >5 women);dyslipidemia;hypertension     Objective:     Vitals: There were no vitals taken for this visit.  There is no height or weight on file to calculate BMI.  Advanced Directives 11/06/2018 10/25/2017 12/09/2015 10/20/2015 06/11/2015  Does Patient Have a Medical Advance Directive? No No No No No  Would patient like information on creating a medical advance directive? Yes (MAU/Ambulatory/Procedural Areas - Information given) Yes (MAU/Ambulatory/Procedural Areas - Information given) Yes - Educational materials given - -    Tobacco Social History   Tobacco Use  Smoking Status Never Smoker  Smokeless Tobacco Never Used  Tobacco Comment   Remote 2nd hand exposure     Counseling given: Not Answered Comment: Remote 2nd hand exposure   Clinical Intake:  Pre-visit preparation completed: Yes  Pain : No/denies pain     Nutritional Risks: None Diabetes: No  How often do you need to have someone help you when you read instructions, pamphlets, or other written materials from your doctor or pharmacy?: 1 - Never What is the last grade level you completed in school?: 2 years of college  Interpreter Needed?: No  Information entered by :: CJohnson, LPN  Past Medical History:  Diagnosis Date  . Allergic rhinitis   . Arthritis    hip  . Borderline high cholesterol   . Cataract   . Cough variant asthma   . Diverticulosis of colon   . GERD  (gastroesophageal reflux disease)   . HTN (hypertension)   . Hyperglycemia    borderline DM  . OP (osteoporosis)   . Seasonal allergies   . Sleep apnea    no cpap   Past Surgical History:  Procedure Laterality Date  . COLONOSCOPY    . COLPOSCOPY    . ESOPHAGOGASTRODUODENOSCOPY  10/01   Negative  . ROTATOR CUFF REPAIR     right  . TUBAL LIGATION     Family History  Problem Relation Age of Onset  . Hypertension Father   . Diabetes Father   . Diabetes Brother   . Diabetes Brother   . Cancer Brother        prostate  . Cancer Brother        prostate  . Cancer Brother        prostate  . Cancer Brother        prostate  . Cancer Brother        prostate  . Cancer Brother        prostate  . Breast cancer Maternal Aunt   . Colon cancer Neg Hx    Social History   Socioeconomic History  . Marital status: Married    Spouse name: Not on file  . Number of children: 1  . Years of education: Not on file  . Highest education level: Not on file  Occupational History  . Occupation: Clinical biochemist  Social Needs  . Financial resource strain: Not hard at all  . Food insecurity  Worry: Never true    Inability: Never true  . Transportation needs    Medical: No    Non-medical: No  Tobacco Use  . Smoking status: Never Smoker  . Smokeless tobacco: Never Used  . Tobacco comment: Remote 2nd hand exposure  Substance and Sexual Activity  . Alcohol use: No    Alcohol/week: 0.0 standard drinks  . Drug use: No  . Sexual activity: Yes  Lifestyle  . Physical activity    Days per week: 3 days    Minutes per session: 30 min  . Stress: Not at all  Relationships  . Social Herbalist on phone: Not on file    Gets together: Not on file    Attends religious service: Not on file    Active member of club or organization: Not on file    Attends meetings of clubs or organizations: Not on file    Relationship status: Not on file  Other Topics Concern  . Not on file   Social History Narrative  . Not on file    Outpatient Encounter Medications as of 11/06/2018  Medication Sig  . albuterol (PROVENTIL HFA) 108 (90 Base) MCG/ACT inhaler INHALE 2 PUFFS EVERY 4 HOURS AS NEEDED  . Calcium Carb-Cholecalciferol (CALCIUM 600+D3) 600-800 MG-UNIT TABS Take by mouth.  . fluticasone (FLONASE) 50 MCG/ACT nasal spray Place 2 sprays into both nostrils daily.  . fluticasone (FLOVENT HFA) 44 MCG/ACT inhaler USE 2 INHALATIONS TWICE A DAY (RINSE MOUTH AFTER USE)  . latanoprost (XALATAN) 0.005 % ophthalmic solution Place 1 drop into both eyes at bedtime.   . montelukast (SINGULAIR) 10 MG tablet Take 1 tablet (10 mg total) by mouth at bedtime.  Marland Kitchen olmesartan-hydrochlorothiazide (BENICAR HCT) 40-12.5 MG tablet Take 1 tablet by mouth daily.  . benzonatate (TESSALON) 200 MG capsule Take 1 capsule (200 mg total) by mouth 3 (three) times daily as needed. Swallow whole, do not bite pill (Patient not taking: Reported on 11/06/2018)  . doxycycline (VIBRA-TABS) 100 MG tablet Take 1 tablet (100 mg total) by mouth 2 (two) times daily. (Patient not taking: Reported on 11/06/2018)  . predniSONE (DELTASONE) 10 MG tablet Take 3 pills once daily by mouth for 3 days, then 2 pills once daily for 3 days, then 1 pill once daily for 3 days and then stop (Patient not taking: Reported on 11/06/2018)   No facility-administered encounter medications on file as of 11/06/2018.     Activities of Daily Living In your present state of health, do you have any difficulty performing the following activities: 11/06/2018  Hearing? Y  Comment ringing in the right ear  Vision? N  Difficulty concentrating or making decisions? N  Walking or climbing stairs? N  Dressing or bathing? N  Doing errands, shopping? N  Preparing Food and eating ? N  Using the Toilet? N  In the past six months, have you accidently leaked urine? N  Do you have problems with loss of bowel control? N  Managing your Medications? N  Managing  your Finances? N  Housekeeping or managing your Housekeeping? N  Some recent data might be hidden    Patient Care Team: Tower, Wynelle Fanny, MD as PCP - Lillia Pauls, MD as Consulting Physician (Ophthalmology)    Assessment:   This is a routine wellness examination for Jasmine Rollins.  Exercise Activities and Dietary recommendations Current Exercise Habits: Home exercise routine, Type of exercise: walking;treadmill, Time (Minutes): 30, Frequency (Times/Week): 3, Weekly Exercise (Minutes/Week): 90, Intensity:  Mild, Exercise limited by: None identified  Goals    . Patient Stated     Starting 10/25/2017, I will continue to take medications as prescribed.     . Patient Stated     11/06/2018, Patient wants to take more time for herself and doing things she likes.        Fall Risk Fall Risk  11/06/2018 10/25/2017 01/25/2017 12/09/2015  Falls in the past year? 0 No Yes No  Number falls in past yr: 0 - 1 -  Injury with Fall? - - Yes -  Risk for fall due to : Medication side effect - - -  Follow up Falls evaluation completed;Falls prevention discussed - Falls evaluation completed -   Is the patient's home free of loose throw rugs in walkways, pet beds, electrical cords, etc?   yes      Grab bars in the bathroom? yes      Handrails on the stairs?   yes      Adequate lighting?   yes  Timed Get Up and Go performed: n/a  Depression Screen PHQ 2/9 Scores 11/06/2018 10/25/2017 01/25/2017 12/09/2015  PHQ - 2 Score 0 0 0 0  PHQ- 9 Score 0 0 - -     Cognitive Function MMSE - Mini Mental State Exam 11/06/2018 10/25/2017 12/09/2015  Orientation to time 5 5 5   Orientation to Place 5 5 5   Registration 3 3 3   Attention/ Calculation 5 0 0  Recall 3 3 3   Language- name 2 objects - 0 0  Language- repeat 1 1 1   Language- follow 3 step command - 3 3  Language- read & follow direction - 0 0  Write a sentence - 0 0  Copy design - 0 0  Total score - 20 20     Mini Cog  Mini-Cog screen was  completed. Maximum score is 22. A value of 0 denotes this part of the MMSE was not completed or the patient failed this part of the Mini-Cog screening.    Immunization History  Administered Date(s) Administered  . Influenza Whole 11/12/2008, 11/05/2009  . Influenza, High Dose Seasonal PF 11/13/2014, 11/03/2017, 10/16/2018  . Influenza,inj,Quad PF,6+ Mos 11/03/2016  . Influenza-Unspecified 11/28/2012, 11/15/2013, 11/03/2016  . Pneumococcal Conjugate-13 12/09/2015  . Pneumococcal Polysaccharide-23 03/19/2009, 01/25/2017  . Td 05/15/2001  . Tdap 06/22/2010  . Zoster 08/05/2010    Qualifies for Shingles Vaccine? yes  Screening Tests Health Maintenance  Topic Date Due  . MAMMOGRAM  11/28/2019  . TETANUS/TDAP  06/21/2020  . COLONOSCOPY  06/10/2025  . INFLUENZA VACCINE  Completed  . DEXA SCAN  Completed  . Hepatitis C Screening  Completed  . PNA vac Low Risk Adult  Completed    Cancer Screenings: Lung: Low Dose CT Chest recommended if Age 66-80 years, 30 pack-year currently smoking OR have quit w/in 15years. Patient does not qualify. Breast:  Up to date on Mammogram? Yes, completed 11/27/2017   Up to date of Bone Density/Dexa? Yes, completed 11/06/2017 Colorectal: completed 06/11/2015  Additional Screenings: : Hepatitis C Screening: 06/08/2015     Plan:    Patient wants to take more time for herself and doing things that she likes.    I have personally reviewed and noted the following in the patient's chart:   . Medical and social history . Use of alcohol, tobacco or illicit drugs  . Current medications and supplements . Functional ability and status . Nutritional status . Physical activity . Advanced directives .  List of other physicians . Hospitalizations, surgeries, and ER visits in previous 12 months . Vitals . Screenings to include cognitive, depression, and falls . Referrals and appointments  In addition, I have reviewed and discussed with patient certain  preventive protocols, quality metrics, and best practice recommendations. A written personalized care plan for preventive services as well as general preventive health recommendations were provided to patient.     Janalyn ShyJohnson, , LPN  1/61/09609/22/2020

## 2018-11-06 NOTE — Patient Instructions (Signed)
Jasmine Rollins , Thank you for taking time to come for your Medicare Wellness Visit. I appreciate your ongoing commitment to your health goals. Please review the following plan we discussed and let me know if I can assist you in the future.   Screening recommendations/referrals: Colonoscopy: up to date, completed 06/11/2015 Mammogram: up to date, completed 11/27/2017 Bone Density: up to date, completed 11/06/2017 Recommended yearly ophthalmology/optometry visit for glaucoma screening and checkup Recommended yearly dental visit for hygiene and checkup  Vaccinations: Influenza vaccine: up to date, completed 10/16/2018 Pneumococcal vaccine: series completed Tdap vaccine: up to date, completed 06/22/2010 Shingles vaccine: will discuss with provider at next office visit     Advanced directives: Please bring a copy of your POA (Power of Williams) and/or Living Will to your next appointment.   Conditions/risks identified: hypertension, hypercholesterolemia  Next appointment: 11/09/2018 @ 9:30 am    Preventive Care 9 Years and Older, Female Preventive care refers to lifestyle choices and visits with your health care provider that can promote health and wellness. What does preventive care include?  A yearly physical exam. This is also called an annual well check.  Dental exams once or twice a year.  Routine eye exams. Ask your health care provider how often you should have your eyes checked.  Personal lifestyle choices, including:  Daily care of your teeth and gums.  Regular physical activity.  Eating a healthy diet.  Avoiding tobacco and drug use.  Limiting alcohol use.  Practicing safe sex.  Taking low-dose aspirin every day.  Taking vitamin and mineral supplements as recommended by your health care provider. What happens during an annual well check? The services and screenings done by your health care provider during your annual well check will depend on your age, overall health,  lifestyle risk factors, and family history of disease. Counseling  Your health care provider may ask you questions about your:  Alcohol use.  Tobacco use.  Drug use.  Emotional well-being.  Home and relationship well-being.  Sexual activity.  Eating habits.  History of falls.  Memory and ability to understand (cognition).  Work and work Statistician.  Reproductive health. Screening  You may have the following tests or measurements:  Height, weight, and BMI.  Blood pressure.  Lipid and cholesterol levels. These may be checked every 5 years, or more frequently if you are over 53 years old.  Skin check.  Lung cancer screening. You may have this screening every year starting at age 69 if you have a 30-pack-year history of smoking and currently smoke or have quit within the past 15 years.  Fecal occult blood test (FOBT) of the stool. You may have this test every year starting at age 59.  Flexible sigmoidoscopy or colonoscopy. You may have a sigmoidoscopy every 5 years or a colonoscopy every 10 years starting at age 3.  Hepatitis C blood test.  Hepatitis B blood test.  Sexually transmitted disease (STD) testing.  Diabetes screening. This is done by checking your blood sugar (glucose) after you have not eaten for a while (fasting). You may have this done every 1-3 years.  Bone density scan. This is done to screen for osteoporosis. You may have this done starting at age 70.  Mammogram. This may be done every 1-2 years. Talk to your health care provider about how often you should have regular mammograms. Talk with your health care provider about your test results, treatment options, and if necessary, the need for more tests. Vaccines  Your health  care provider may recommend certain vaccines, such as:  Influenza vaccine. This is recommended every year.  Tetanus, diphtheria, and acellular pertussis (Tdap, Td) vaccine. You may need a Td booster every 10 years.  Zoster  vaccine. You may need this after age 41.  Pneumococcal 13-valent conjugate (PCV13) vaccine. One dose is recommended after age 39.  Pneumococcal polysaccharide (PPSV23) vaccine. One dose is recommended after age 40. Talk to your health care provider about which screenings and vaccines you need and how often you need them. This information is not intended to replace advice given to you by your health care provider. Make sure you discuss any questions you have with your health care provider. Document Released: 02/27/2015 Document Revised: 10/21/2015 Document Reviewed: 12/02/2014 Elsevier Interactive Patient Education  2017 [place] Prevention in the Home Falls can cause injuries. They can happen to people of all ages. There are many things you can do to make your home safe and to help prevent falls. What can I do on the outside of my home?  Regularly fix the edges of walkways and driveways and fix any cracks.  Remove anything that might make you trip as you walk through a door, such as a raised step or threshold.  Trim any bushes or trees on the path to your home.  Use bright outdoor lighting.  Clear any walking paths of anything that might make someone trip, such as rocks or tools.  Regularly check to see if handrails are loose or broken. Make sure that both sides of any steps have handrails.  Any raised decks and porches should have guardrails on the edges.  Have any leaves, snow, or ice cleared regularly.  Use sand or salt on walking paths during winter.  Clean up any spills in your garage right away. This includes oil or grease spills. What can I do in the bathroom?  Use night lights.  Install grab bars by the toilet and in the tub and shower. Do not use towel bars as grab bars.  Use non-skid mats or decals in the tub or shower.  If you need to sit down in the shower, use a plastic, non-slip stool.  Keep the floor dry. Clean up any water that spills on the  floor as soon as it happens.  Remove soap buildup in the tub or shower regularly.  Attach bath mats securely with double-sided non-slip rug tape.  Do not have throw rugs and other things on the floor that can make you trip. What can I do in the bedroom?  Use night lights.  Make sure that you have a light by your bed that is easy to reach.  Do not use any sheets or blankets that are too big for your bed. They should not hang down onto the floor.  Have a firm chair that has side arms. You can use this for support while you get dressed.  Do not have throw rugs and other things on the floor that can make you trip. What can I do in the kitchen?  Clean up any spills right away.  Avoid walking on wet floors.  Keep items that you use a lot in easy-to-reach places.  If you need to reach something above you, use a strong step stool that has a grab bar.  Keep electrical cords out of the way.  Do not use floor polish or wax that makes floors slippery. If you must use wax, use non-skid floor wax.  Do not have  throw rugs and other things on the floor that can make you trip. What can I do with my stairs?  Do not leave any items on the stairs.  Make sure that there are handrails on both sides of the stairs and use them. Fix handrails that are broken or loose. Make sure that handrails are as long as the stairways.  Check any carpeting to make sure that it is firmly attached to the stairs. Fix any carpet that is loose or worn.  Avoid having throw rugs at the top or bottom of the stairs. If you do have throw rugs, attach them to the floor with carpet tape.  Make sure that you have a light switch at the top of the stairs and the bottom of the stairs. If you do not have them, ask someone to add them for you. What else can I do to help prevent falls?  Wear shoes that:  Do not have high heels.  Have rubber bottoms.  Are comfortable and fit you well.  Are closed at the toe. Do not wear  sandals.  If you use a stepladder:  Make sure that it is fully opened. Do not climb a closed stepladder.  Make sure that both sides of the stepladder are locked into place.  Ask someone to hold it for you, if possible.  Clearly mark and make sure that you can see:  Any grab bars or handrails.  First and last steps.  Where the edge of each step is.  Use tools that help you move around (mobility aids) if they are needed. These include:  Canes.  Walkers.  Scooters.  Crutches.  Turn on the lights when you go into a dark area. Replace any light bulbs as soon as they burn out.  Set up your furniture so you have a clear path. Avoid moving your furniture around.  If any of your floors are uneven, fix them.  If there are any pets around you, be aware of where they are.  Review your medicines with your doctor. Some medicines can make you feel dizzy. This can increase your chance of falling. Ask your doctor what other things that you can do to help prevent falls. This information is not intended to replace advice given to you by your health care provider. Make sure you discuss any questions you have with your health care provider. Document Released: 11/27/2008 Document Revised: 07/09/2015 Document Reviewed: 03/07/2014 Elsevier Interactive Patient Education  2017 [employer].

## 2018-11-09 ENCOUNTER — Ambulatory Visit (INDEPENDENT_AMBULATORY_CARE_PROVIDER_SITE_OTHER): Payer: Medicare Other | Admitting: Family Medicine

## 2018-11-09 ENCOUNTER — Encounter: Payer: Self-pay | Admitting: Family Medicine

## 2018-11-09 ENCOUNTER — Other Ambulatory Visit: Payer: Self-pay

## 2018-11-09 ENCOUNTER — Other Ambulatory Visit: Payer: Self-pay | Admitting: Family Medicine

## 2018-11-09 VITALS — BP 136/80 | HR 78 | Temp 97.8°F | Ht 62.0 in | Wt 134.1 lb

## 2018-11-09 DIAGNOSIS — E559 Vitamin D deficiency, unspecified: Secondary | ICD-10-CM

## 2018-11-09 DIAGNOSIS — I10 Essential (primary) hypertension: Secondary | ICD-10-CM

## 2018-11-09 DIAGNOSIS — Z0000 Encounter for general adult medical examination without abnormal findings: Secondary | ICD-10-CM | POA: Diagnosis not present

## 2018-11-09 DIAGNOSIS — E7800 Pure hypercholesterolemia, unspecified: Secondary | ICD-10-CM

## 2018-11-09 DIAGNOSIS — H9201 Otalgia, right ear: Secondary | ICD-10-CM

## 2018-11-09 DIAGNOSIS — M810 Age-related osteoporosis without current pathological fracture: Secondary | ICD-10-CM | POA: Diagnosis not present

## 2018-11-09 DIAGNOSIS — D72818 Other decreased white blood cell count: Secondary | ICD-10-CM | POA: Diagnosis not present

## 2018-11-09 DIAGNOSIS — J301 Allergic rhinitis due to pollen: Secondary | ICD-10-CM

## 2018-11-09 DIAGNOSIS — R7303 Prediabetes: Secondary | ICD-10-CM

## 2018-11-09 MED ORDER — FLOVENT HFA 44 MCG/ACT IN AERO: INHALATION_SPRAY | RESPIRATORY_TRACT | 3 refills | Status: DC

## 2018-11-09 MED ORDER — OLMESARTAN MEDOXOMIL-HCTZ 40-12.5 MG PO TABS: 1.0000 | ORAL_TABLET | Freq: Every day | ORAL | 3 refills | Status: DC

## 2018-11-09 MED ORDER — MONTELUKAST SODIUM 10 MG PO TABS: 10.0000 mg | ORAL_TABLET | Freq: Every day | ORAL | 3 refills | Status: DC

## 2018-11-09 MED ORDER — ALENDRONATE SODIUM 70 MG PO TABS: 70.0000 mg | ORAL_TABLET | ORAL | 3 refills | Status: DC

## 2018-11-09 MED ORDER — FLUTICASONE PROPIONATE 50 MCG/ACT NA SUSP: 2.0000 | Freq: Every day | NASAL | 3 refills | Status: DC

## 2018-11-09 MED ORDER — ALBUTEROL SULFATE HFA 108 (90 BASE) MCG/ACT IN AERS: INHALATION_SPRAY | RESPIRATORY_TRACT | 3 refills | Status: DC

## 2018-11-09 NOTE — Patient Instructions (Addendum)
If you are interested in the new shingles vaccine (Shingrix) - call your local pharmacy to check on coverage and availability  If affordable, get on a wait list at your pharmacy to get the vaccine.  Don't forget to schedule your mammogram in October   For cholesterol  Avoid red meat/ fried foods/ egg yolks/ fatty breakfast meats/ butter, cheese and high fat dairy/ and shellfish   Cut the fatty breakfast meats and fatty dairy   For diabetes prevention :  Try to get most of your carbohydrates from produce (with the exception of white potatoes)  Eat less bread/pasta/rice/snack foods/cereals/sweets and other items from the middle of the grocery store (processed carbs)   For you - stopping sweets is the key   Keep exercising   If/when you want to see ENT about your ear let me know

## 2018-11-09 NOTE — Progress Notes (Signed)
Subjective:    Patient ID: Jasmine Rollins, female    DOB: 1950-04-15, 68 y.o.   MRN: [id number]  HPI Here for health maintenance exam and to review chronic medical problems    Wt Readings from Last 3 Encounters:  11/09/18 134 lb 1 oz (60.8 kg)  06/08/18 132 lb 9 oz (60.1 kg)  11/29/17 129 lb 1.9 oz (58.6 kg)   24.52 kg/m  Did not exercise when she was sick Just started back - very good  Eats well   Had amw on 9/22 No gaps or abn screening  Is interested in shingrix   (did have zostax in 6/2)    Mammogram 10/19  Self breast exam - no lumps or changes   Tdap 5/12  Colonoscopy 4/17   dexa 9/19  OP in spine and osteopenia in other areas  Fosamax in the past  (did very well on it in the past)  Vit D level 54.5 -good  Supplements- D Walking for exercise  No falls  No fractures    bp is stable today  No cp or palpitations or headaches or edema  No side effects to medicines  BP Readings from Last 3 Encounters:  11/09/18 (!) 148/92  06/08/18 129/83  11/29/17 140/86    Much better on 2nd check 136/80    Lab Results  Component Value Date   CREATININE 0.67 11/06/2018   BUN 10 11/06/2018   NA 142 11/06/2018   K 4.7 11/06/2018   CL 104 11/06/2018   CO2 32 11/06/2018   Lab Results  Component Value Date   ALT 16 11/06/2018   AST 20 11/06/2018   ALKPHOS 84 11/06/2018   BILITOT 0.8 11/06/2018   Lab Results  Component Value Date   WBC 3.8 (L) 11/06/2018   HGB 14.1 11/06/2018   HCT 42.4 11/06/2018   MCV 94.2 11/06/2018   PLT 277.0 11/06/2018   Lab Results  Component Value Date   TSH 4.50 11/06/2018     Hyperlipidemia Lab Results  Component Value Date   CHOL 206 (H) 11/06/2018   CHOL 191 10/25/2017   CHOL 207 (H) 04/21/2017   Lab Results  Component Value Date   HDL 75.50 11/06/2018   HDL 84.50 10/25/2017   HDL 85.20 04/21/2017   Lab Results  Component Value Date   LDLCALC 115 (H) 11/06/2018   LDLCALC 93 10/25/2017   LDLCALC 108 (H)  04/21/2017   Lab Results  Component Value Date   TRIG 73.0 11/06/2018   TRIG 65.0 10/25/2017   TRIG 66.0 04/21/2017   Lab Results  Component Value Date   CHOLHDL 3 11/06/2018   CHOLHDL 2 10/25/2017   CHOLHDL 2 04/21/2017   Lab Results  Component Value Date   LDLDIRECT 106.4 02/26/2013   LDLDIRECT 104.2 09/27/2011   LDLDIRECT 86.7 11/27/2007  no fried foods Beef- eats 2-3 month  Eats sausage and bacon regularly  Some fatty dairy  Some shellfish    prediabetes Lab Results  Component Value Date   HGBA1C 6.7 (H) 11/06/2018  unable to exercise for a while  Last time 6.3 Eats sweets every day    Patient Active Problem List   Diagnosis Date Noted  . Cough 10/09/2018  . Right ear pain 06/08/2018  . Vitamin D deficiency 12/06/2015  . GERD (gastroesophageal reflux disease) 06/09/2014  . Encounter for routine gynecological examination 03/03/2014  . Estrogen deficiency 03/03/2014  . Heartburn 03/01/2013  . Gynecological examination 06/22/2010  . Special screening for  malignant neoplasms, colon 06/22/2010  . Glaucoma suspect 06/22/2010  . Routine general medical examination at a health care facility 06/03/2010  . Sleep apnea   . Osteoporosis 05/20/2009  . SLEEP APNEA 10/17/2008  . COUGH VARIANT ASTHMA 07/30/2008  . Leukocytopenia 01/29/2008  . Prediabetes 11/27/2007  . HYPERCHOLESTEROLEMIA 03/21/2007  . Essential hypertension 03/21/2007  . Sinusitis, chronic 03/21/2007  . Allergic rhinitis 03/21/2007  . DIVERTICULOSIS, COLON 03/21/2007   Past Medical History:  Diagnosis Date  . Allergic rhinitis   . Arthritis    hip  . Borderline high cholesterol   . Cataract   . Cough variant asthma   . Diverticulosis of colon   . GERD (gastroesophageal reflux disease)   . HTN (hypertension)   . Hyperglycemia    borderline DM  . OP (osteoporosis)   . Seasonal allergies   . Sleep apnea    no cpap   Past Surgical History:  Procedure Laterality Date  . COLONOSCOPY     . COLPOSCOPY    . ESOPHAGOGASTRODUODENOSCOPY  10/01   Negative  . ROTATOR CUFF REPAIR     right  . TUBAL LIGATION     Social History   Tobacco Use  . Smoking status: Never Smoker  . Smokeless tobacco: Never Used  . Tobacco comment: Remote 2nd hand exposure  Substance Use Topics  . Alcohol use: No    Alcohol/week: 0.0 standard drinks  . Drug use: No   Family History  Problem Relation Age of Onset  . Hypertension Father   . Diabetes Father   . Diabetes Brother   . Diabetes Brother   . Cancer Brother        prostate  . Cancer Brother        prostate  . Cancer Brother        prostate  . Cancer Brother        prostate  . Cancer Brother        prostate  . Cancer Brother        prostate  . Breast cancer Maternal Aunt   . Colon cancer Neg Hx    Allergies  Allergen Reactions  . Augmentin [Amoxicillin-Pot Clavulanate]     GI upset   . Sulfonamide Derivatives Rash   Current Outpatient Medications on File Prior to Visit  Medication Sig Dispense Refill  . Calcium Carb-Cholecalciferol (CALCIUM 600+D3) 600-800 MG-UNIT TABS Take by mouth.    . latanoprost (XALATAN) 0.005 % ophthalmic solution Place 1 drop into both eyes at bedtime.      No current facility-administered medications on file prior to visit.       Review of Systems  Constitutional: Negative for activity change, appetite change, fatigue, fever and unexpected weight change.  HENT: Positive for tinnitus. Negative for congestion, ear discharge, ear pain, hearing loss, rhinorrhea, sinus pressure and sore throat.        R ear fullness  Eyes: Negative for pain, redness and visual disturbance.  Respiratory: Negative for cough, shortness of breath and wheezing.   Cardiovascular: Negative for chest pain and palpitations.  Gastrointestinal: Negative for abdominal pain, blood in stool, constipation and diarrhea.  Endocrine: Negative for polydipsia and polyuria.  Genitourinary: Negative for dysuria, frequency and  urgency.  Musculoskeletal: Negative for arthralgias, back pain and myalgias.  Skin: Negative for pallor and rash.  Allergic/Immunologic: Negative for environmental allergies.  Neurological: Negative for dizziness, syncope and headaches.  Hematological: Negative for adenopathy. Does not bruise/bleed easily.  Psychiatric/Behavioral: Negative for decreased concentration and  dysphoric mood. The patient is not nervous/anxious.        Objective:   Physical Exam Constitutional:      General: She is not in acute distress.    Appearance: Normal appearance. She is well-developed and normal weight. She is not ill-appearing or diaphoretic.  HENT:     Head: Normocephalic and atraumatic.     Right Ear: Tympanic membrane, ear canal and external ear normal.     Left Ear: Tympanic membrane, ear canal and external ear normal.     Nose: Nose normal. No congestion.     Comments: Nares are boggy    Mouth/Throat:     Mouth: Mucous membranes are moist.     Pharynx: Oropharynx is clear. No posterior oropharyngeal erythema.  Eyes:     General: No scleral icterus.    Extraocular Movements: Extraocular movements intact.     Conjunctiva/sclera: Conjunctivae normal.     Pupils: Pupils are equal, round, and reactive to light.  Neck:     Musculoskeletal: Normal range of motion and neck supple. No neck rigidity or muscular tenderness.     Thyroid: No thyromegaly.     Vascular: No carotid bruit or JVD.  Cardiovascular:     Rate and Rhythm: Normal rate and regular rhythm.     Pulses: Normal pulses.     Heart sounds: Normal heart sounds. No gallop.   Pulmonary:     Effort: Pulmonary effort is normal. No respiratory distress.     Breath sounds: Normal breath sounds. No wheezing.     Comments: Good air exch Chest:     Chest wall: No tenderness.  Abdominal:     General: Bowel sounds are normal. There is no distension or abdominal bruit.     Palpations: Abdomen is soft. There is no mass.     Tenderness: There  is no abdominal tenderness.     Hernia: No hernia is present.  Genitourinary:    Comments: Breast exam: No mass, nodules, thickening, tenderness, bulging, retraction, inflamation, nipple discharge or skin changes noted.  No axillary or clavicular LA.     Musculoskeletal: Normal range of motion.        General: No tenderness.     Right lower leg: No edema.     Left lower leg: No edema.  Lymphadenopathy:     Cervical: No cervical adenopathy.  Skin:    General: Skin is warm and dry.     Coloration: Skin is not pale.     Findings: No erythema or rash.  Neurological:     Mental Status: She is alert. Mental status is at baseline.     Cranial Nerves: No cranial nerve deficit.     Motor: No abnormal muscle tone.     Coordination: Coordination normal.     Gait: Gait normal.     Deep Tendon Reflexes: Reflexes are normal and symmetric. Reflexes normal.  Psychiatric:        Mood and Affect: Mood normal.        Cognition and Memory: Cognition and memory normal.           Assessment & Plan:   Problem List Items Addressed This Visit      Cardiovascular and Mediastinum   Essential hypertension    bp in fair control at this time  BP Readings from Last 1 Encounters:  11/09/18 136/80   No changes needed Most recent labs reviewed  Disc lifstyle change with low sodium diet and exercise  Relevant Medications   olmesartan-hydrochlorothiazide (BENICAR HCT) 40-12.5 MG tablet     Respiratory   Allergic rhinitis    Improved on current regimen  R ear is still bothering her however        Musculoskeletal and Integument   Osteoporosis    dexa 9/19 Tolerated fosamax years ago-will start back on this with 5 y course planned unless side effects No falls or fx Continues to walk  Therapeutic vit D level      Relevant Medications   alendronate (FOSAMAX) 70 MG tablet     Other   HYPERCHOLESTEROLEMIA    Disc goals for lipids and reasons to control them Rev last labs with pt  Rev low sat fat diet in detail LDL is up to 115  Plans to cut back on fatty pork and dairy and shellfish        Relevant Medications   olmesartan-hydrochlorothiazide (BENICAR HCT) 40-12.5 MG tablet   Other Relevant Orders   Lipid panel   Leukocytopenia    Stable with wbc 3.8  No symptoms  Her baseline  Will continue to follow       Prediabetes    Lab Results  Component Value Date   HGBA1C 6.7 (H) 11/06/2018   This is up from 6.3 Eating sweets regularly  Made plan to stop sweets and get most of her carbs from produce  Re check in 3 months       Relevant Orders   Hemoglobin A1c   Routine general medical examination at a health care facility - Primary    Reviewed health habits including diet and exercise and skin cancer prevention Reviewed appropriate screening tests for age  Also reviewed health mt list, fam hx and immunization status , as well as social and family history   See HPI Labs reviewed  amw reviewed  Disc shingrix/ she will check on coverage Pt will schedule her mammogram for October dexa reviewed-starting back on fosamax  Enc low glycemic diet        Vitamin D deficiency    Level of 54 Vitamin D level is therapeutic with current supplementation Disc importance of this to bone and overall health       Right ear pain    Tinnitus now  Watching  No change in exam  ? If ETD  Continues allergy medicines  May benefit from ENT referral -she will call if she wishes to do this

## 2018-11-09 NOTE — Assessment & Plan Note (Addendum)
Tinnitus now  Watching  No change in exam  ? If ETD  Continues allergy medicines  May benefit from ENT referral -she will call if she wishes to do this

## 2018-11-10 NOTE — Assessment & Plan Note (Signed)
Improved on current regimen  R ear is still bothering her however

## 2018-11-10 NOTE — Assessment & Plan Note (Signed)
dexa 9/19 Tolerated fosamax years ago-will start back on this with 5 y course planned unless side effects No falls or fx Continues to walk  Therapeutic vit D level

## 2018-11-10 NOTE — Assessment & Plan Note (Signed)
Stable with wbc 3.8  No symptoms  Her baseline  Will continue to follow

## 2018-11-10 NOTE — Assessment & Plan Note (Signed)
bp in fair control at this time  BP Readings from Last 1 Encounters:  11/09/18 136/80   No changes needed Most recent labs reviewed  Disc lifstyle change with low sodium diet and exercise

## 2018-11-10 NOTE — Assessment & Plan Note (Signed)
Reviewed health habits including diet and exercise and skin cancer prevention Reviewed appropriate screening tests for age  Also reviewed health mt list, fam hx and immunization status , as well as social and family history   See HPI Labs reviewed  amw reviewed  Disc shingrix/ she will check on coverage Pt will schedule her mammogram for October dexa reviewed-starting back on fosamax  Enc low glycemic diet

## 2018-11-10 NOTE — Assessment & Plan Note (Signed)
Disc goals for lipids and reasons to control them Rev last labs with pt Rev low sat fat diet in detail LDL is up to 115  Plans to cut back on fatty pork and dairy and shellfish

## 2018-11-10 NOTE — Assessment & Plan Note (Signed)
Level of 54 Vitamin D level is therapeutic with current supplementation Disc importance of this to bone and overall health

## 2018-11-10 NOTE — Assessment & Plan Note (Signed)
Lab Results  Component Value Date   HGBA1C 6.7 (H) 11/06/2018   This is up from 6.3 Eating sweets regularly  Made plan to stop sweets and get most of her carbs from produce  Re check in 3 months

## 2018-11-28 ENCOUNTER — Other Ambulatory Visit: Payer: Self-pay | Admitting: Family Medicine

## 2018-11-28 DIAGNOSIS — Z1231 Encounter for screening mammogram for malignant neoplasm of breast: Secondary | ICD-10-CM

## 2018-12-03 DIAGNOSIS — H401131 Primary open-angle glaucoma, bilateral, mild stage: Secondary | ICD-10-CM | POA: Diagnosis not present

## 2018-12-03 DIAGNOSIS — H04123 Dry eye syndrome of bilateral lacrimal glands: Secondary | ICD-10-CM | POA: Diagnosis not present

## 2018-12-03 DIAGNOSIS — H40033 Anatomical narrow angle, bilateral: Secondary | ICD-10-CM | POA: Diagnosis not present

## 2018-12-07 ENCOUNTER — Encounter: Payer: Self-pay | Admitting: Family Medicine

## 2018-12-10 MED ORDER — FREESTYLE LITE TEST VI STRP: ORAL_STRIP | 0 refills | Status: DC

## 2018-12-10 NOTE — Telephone Encounter (Signed)
Will route to PCP since I don't see we have prescribed this before

## 2019-01-17 ENCOUNTER — Other Ambulatory Visit: Payer: Self-pay

## 2019-01-17 ENCOUNTER — Ambulatory Visit
Admission: RE | Admit: 2019-01-17 | Discharge: 2019-01-17 | Disposition: A | Discharge status: Home / Self Care | Payer: Medicare Other | Source: Ambulatory Visit | Attending: Family Medicine | Admitting: Family Medicine

## 2019-01-17 DIAGNOSIS — Z1231 Encounter for screening mammogram for malignant neoplasm of breast: Secondary | ICD-10-CM

## 2019-02-05 ENCOUNTER — Other Ambulatory Visit (INDEPENDENT_AMBULATORY_CARE_PROVIDER_SITE_OTHER): Payer: Medicare Other

## 2019-02-05 ENCOUNTER — Other Ambulatory Visit: Payer: Self-pay

## 2019-02-05 DIAGNOSIS — R7303 Prediabetes: Secondary | ICD-10-CM | POA: Diagnosis not present

## 2019-02-05 DIAGNOSIS — E7800 Pure hypercholesterolemia, unspecified: Secondary | ICD-10-CM

## 2019-02-05 LAB — LIPID PANEL
Cholesterol: 213 mg/dL — ABNORMAL HIGH (ref 0–200)
HDL: 73.1 mg/dL (ref >39.00)
LDL Cholesterol: 126 mg/dL — ABNORMAL HIGH (ref 0–99)
NonHDL: 140.16
Total CHOL/HDL Ratio: 3
Triglycerides: 70 mg/dL (ref 0.0–149.0)
VLDL: 14 mg/dL (ref 0.0–40.0)

## 2019-02-05 LAB — HEMOGLOBIN A1C: Hgb A1c MFr Bld: 6.2 % (ref 4.6–6.5)

## 2019-04-03 ENCOUNTER — Telehealth: Payer: Self-pay

## 2019-04-03 NOTE — Telephone Encounter (Signed)
Mr Jasmine Rollins [facility] signed) said pt got covid vaccine(Pfizer)at [address] on 03/27/19 and pt started with prod cough with clear phlegm,pts inhaler does help the cough. chills on 03/31/19 and today pt started with temp of 99.3. Temp at 4:30 today is 98.8 and no chills now; Today pt also had lightheadedness earlier but was in a moving car; no lightheadedness now.pt does have sinus pressure under both eyes. No H/A,weakness,muscle or joint pain,rash,SOB,abd pain or diarrhea,no loss of taste or smell; no red eye,S/T or runny nose, and no bruising, bleeding, fatigue or vomiting and no CP or wheezing. Pt has not been exposed to anyone with + covid virus. Pt taking acetaminophen 325 mg taking 2 tabs q 4 h prn. Pt is feeling better but was advised when got the covid vaccine if symptoms last more than few days to contact PCP. [facility] [address] Mr [name] request cb.

## 2019-04-03 NOTE — Telephone Encounter (Signed)
Keep treating the symptoms with tylenol/fluids rest  Expectorant like mucinex may help cough and congestion  I do recommend getting tested for covid just to be on the safe side-please give her info (either [facility] or wherever she wants) and isolate herself  Unsure if it is related to the vaccine since it has been a week- but cannot rule that out   Keep [place] posted please

## 2019-04-03 NOTE — Telephone Encounter (Signed)
Pt notified of Dr. Royden Purl comments and testing info given

## 2019-04-08 ENCOUNTER — Ambulatory Visit (INDEPENDENT_AMBULATORY_CARE_PROVIDER_SITE_OTHER): Payer: Medicare Other | Admitting: Family Medicine

## 2019-04-08 ENCOUNTER — Encounter: Payer: Self-pay | Admitting: Nurse Practitioner

## 2019-04-08 ENCOUNTER — Telehealth (HOSPITAL_COMMUNITY): Payer: Self-pay | Admitting: Nurse Practitioner

## 2019-04-08 ENCOUNTER — Other Ambulatory Visit (HOSPITAL_COMMUNITY): Payer: Self-pay | Admitting: Nurse Practitioner

## 2019-04-08 ENCOUNTER — Encounter: Payer: Self-pay | Admitting: Family Medicine

## 2019-04-08 DIAGNOSIS — U071 COVID-19: Secondary | ICD-10-CM

## 2019-04-08 NOTE — Assessment & Plan Note (Signed)
Positive test on 2/20  Symptoms started 2/14 Had her first covid vaccine on 2/10 and next one is due on 3/3 She is 8 days in to symptoms and they are very mild-reassuring  Enc symptomatic care- fluids/rest/albuterol/ acetaminophen and mucinex Will call if worse or new symptoms  If symptoms are better-she is ok to get 2nd dose of the vaccine  She will continue to isolate with family in the meantime

## 2019-04-08 NOTE — Telephone Encounter (Signed)
Called to Discuss with patient about Covid symptoms and the use of bamlanivimab, a monoclonal antibody infusion for those with mild to moderate Covid symptoms and at a high risk of hospitalization.     Pt is qualified for this infusion at the [facility] infusion center due to co-morbid conditions and/or a member of an at-risk group.     Symptom onset 2/14. Symptomatic with cough & congestion. Has received first vaccine on 2/10. See orders encounter for further details.   Consuello Masse, DNP, AGNP-C [phone] (Infusion Center Hotline)

## 2019-04-08 NOTE — Patient Instructions (Addendum)
Drink fluids and rest  Continue to isolate until symptoms are better  If your symptoms worsen or you develop new ones please let me know  mucinex and acetaminophen are fine  Continue flonase and also albuterol inhaler as needed

## 2019-04-08 NOTE — Telephone Encounter (Signed)
I spoke with pt;pt tested positive for covid on 04/06/19. Pt has already scheduled virtual appt today with Dr Milinda Antis at 12 noon. Pt was instructed to drink plenty of fluids, rest, and take Tylenol for fever or body aches. No SOB. UC & ED precautions given and pt voiced understanding.

## 2019-04-08 NOTE — Progress Notes (Signed)
Virtual Visit via Video Note  I connected with Jasmine Rollins on 04/08/19 at 12:00 PM EST by a video enabled telemedicine application and verified that I am speaking with the correct person using two identifiers.  Location: Patient: home Provider: office   I discussed the limitations of evaluation and management by telemedicine and the availability of in person appointments. The patient expressed understanding and agreed to proceed.  Parties involved in encounter  Patient: Jasmine Rollins  Provider:  Roxy Manns MD    History of Present Illness: Pt presents for covid 19 infection with respiratory symptoms   She had just received her first covid vaccine Proofreader) on 03/27/19 Her symptoms started on 2/14   Pos test on 20th from 2/18   Started with nasal congestion  Then coughing (thought it was her allergies)- and her inhaler helped  Then fever came  99-101.0 -- better than 2-3 days ago   She is over 65 Has hx of cough variant asthma and frequent sinusitis   BP: 115/76  Temp: 99.3 F (37.4 C)   No aches or chills  Had one headache the day after her vaccine  Nasal d/c is clear  Cough is occasionally productive - clear as well  No sob / no wheezing (now)  Throat is ok  Ears - a little full on the right   Did not loose taste or smell  Not much appetite Drinks a lot of fluids   No diarrhea or nausea or abd pain   OTC: tylenol and mucinex  Using her flonase bid     Husband tested positive also  Also her son  Son is asymptomatic  Husband -mild symptoms   Patient Active Problem List   Diagnosis Date Noted  . COVID-19 04/08/2019  . Cough 10/09/2018  . Right ear pain 06/08/2018  . Vitamin D deficiency 12/06/2015  . GERD (gastroesophageal reflux disease) 06/09/2014  . Encounter for routine gynecological examination 03/03/2014  . Estrogen deficiency 03/03/2014  . Heartburn 03/01/2013  . Gynecological examination 06/22/2010  . Special screening for malignant  neoplasms, colon 06/22/2010  . Glaucoma suspect 06/22/2010  . Routine general medical examination at a health care facility 06/03/2010  . Sleep apnea   . Osteoporosis 05/20/2009  . SLEEP APNEA 10/17/2008  . COUGH VARIANT ASTHMA 07/30/2008  . Leukocytopenia 01/29/2008  . Prediabetes 11/27/2007  . HYPERCHOLESTEROLEMIA 03/21/2007  . Essential hypertension 03/21/2007  . Sinusitis, chronic 03/21/2007  . Allergic rhinitis 03/21/2007  . DIVERTICULOSIS, COLON 03/21/2007   Past Medical History:  Diagnosis Date  . Allergic rhinitis   . Arthritis    hip  . Borderline high cholesterol   . Cataract   . Cough variant asthma   . Diverticulosis of colon   . GERD (gastroesophageal reflux disease)   . HTN (hypertension)   . Hyperglycemia    borderline DM  . OP (osteoporosis)   . Seasonal allergies   . Sleep apnea    no cpap   Past Surgical History:  Procedure Laterality Date  . COLONOSCOPY    . COLPOSCOPY    . ESOPHAGOGASTRODUODENOSCOPY  10/01   Negative  . ROTATOR CUFF REPAIR     right  . TUBAL LIGATION     Social History   Tobacco Use  . Smoking status: Never Smoker  . Smokeless tobacco: Never Used  . Tobacco comment: Remote 2nd hand exposure  Substance Use Topics  . Alcohol use: No    Alcohol/week: 0.0 standard drinks  . Drug use: No  Family History  Problem Relation Age of Onset  . Hypertension Father   . Diabetes Father   . Diabetes Brother   . Diabetes Brother   . Cancer Brother        prostate  . Cancer Brother        prostate  . Cancer Brother        prostate  . Cancer Brother        prostate  . Cancer Brother        prostate  . Cancer Brother        prostate  . Breast cancer Maternal Aunt   . Colon cancer Neg Hx    Allergies  Allergen Reactions  . Augmentin [Amoxicillin-Pot Clavulanate]     GI upset   . Sulfonamide Derivatives Rash   Current Outpatient Medications on File Prior to Visit  Medication Sig Dispense Refill  . albuterol (PROVENTIL  HFA) 108 (90 Base) MCG/ACT inhaler INHALE 2 PUFFS EVERY 4 HOURS AS NEEDED 18 g 3  . Calcium Carb-Cholecalciferol (CALCIUM 600+D3) 600-800 MG-UNIT TABS Take by mouth.    . fluticasone (FLONASE) 50 MCG/ACT nasal spray Place 2 sprays into both nostrils daily. 48 g 3  . fluticasone (FLOVENT HFA) 44 MCG/ACT inhaler USE 2 INHALATIONS TWICE A DAY (RINSE MOUTH AFTER USE) 31.8 g 3  . glucose blood (FREESTYLE LITE) test strip To check glucose daily as needed for early diabetes 2 100 each 0  . latanoprost (XALATAN) 0.005 % ophthalmic solution Place 1 drop into both eyes at bedtime.     . montelukast (SINGULAIR) 10 MG tablet Take 1 tablet (10 mg total) by mouth at bedtime. 90 tablet 3  . olmesartan-hydrochlorothiazide (BENICAR HCT) 40-12.5 MG tablet Take 1 tablet by mouth daily. 90 tablet 3   No current facility-administered medications on file prior to visit.   Review of Systems  Constitutional: Positive for fever and malaise/fatigue. Negative for chills.  HENT: Positive for congestion. Negative for ear pain, sinus pain and sore throat.   Eyes: Negative for blurred vision, discharge and redness.  Respiratory: Positive for cough and sputum production. Negative for shortness of breath, wheezing and stridor.   Cardiovascular: Negative for chest pain, palpitations and leg swelling.  Gastrointestinal: Negative for abdominal pain, diarrhea, nausea and vomiting.  Musculoskeletal: Negative for myalgias.  Skin: Negative for rash.  Neurological: Negative for dizziness and headaches.      Observations/Objective: Patient appears well, in no distress (she voices feeling tired)  Weight is baseline  No facial swelling or asymmetry Normal voice-not hoarse and no slurred speech No obvious tremor or mobility impairment Moving neck and UEs normally Able to hear the call well  No cough or shortness of breath during interview (she does clear her throat)  Talkative and mentally sharp with no cognitive changes No skin  changes on face or neck , no rash or pallor Affect is normal   Assessment and Plan: Problem List Items Addressed This Visit      Other   COVID-19    Positive test on 2/20  Symptoms started 2/14 Had her first covid vaccine on 2/10 and next one is due on 3/3 She is 8 days in to symptoms and they are very mild-reassuring  Enc symptomatic care- fluids/rest/albuterol/ acetaminophen and mucinex Will call if worse or new symptoms  If symptoms are better-she is ok to get 2nd dose of the vaccine  She will continue to isolate with family in the meantime  Follow Up Instructions: Drink fluids and rest  Continue to isolate until symptoms are better  If your symptoms worsen or you develop new ones please let me know  mucinex and acetaminophen are fine  Continue flonase and also albuterol inhaler as needed    I discussed the assessment and treatment plan with the patient. The patient was provided an opportunity to ask questions and all were answered. The patient agreed with the plan and demonstrated an understanding of the instructions.   The patient was advised to call back or seek an in-person evaluation if the symptoms worsen or if the condition fails to improve as anticipated.   Loura Pardon, MD

## 2019-04-08 NOTE — Telephone Encounter (Signed)
I will see her then  

## 2019-04-08 NOTE — Progress Notes (Signed)
I connected by phone with Jasmine Rollins on 04/08/2019 at 4:49 PM to discuss the potential use of an new treatment for mild to moderate COVID-19 viral infection in non-hospitalized patients.  This patient is a 69 y.o. female that meets the FDA criteria for Emergency Use Authorization of bamlanivimab or casirivimab\imdevimab.  Has a (+) direct SARS-CoV-2 viral test result  Has mild or moderate COVID-19   Is ? 70 years of age and weighs ? 40 kg  Is NOT hospitalized due to COVID-19  Is NOT requiring oxygen therapy or requiring an increase in baseline oxygen flow rate due to COVID-19  Is within 10 days of symptom onset  Has at least one of the high risk factor(s) for progression to severe COVID-19 and/or hospitalization as defined in EUA.  Specific high risk criteria : >/= 70 yo, COPD, and hypertension.   I have spoken and communicated the following to the patient or parent/caregiver:  1. FDA has authorized the emergency use of bamlanivimab and casirivimab\imdevimab for the treatment of mild to moderate COVID-19 in adults and pediatric patients with positive results of direct SARS-CoV-2 viral testing who are 35 years of age and older weighing at least 40 kg, and who are at high risk for progressing to severe COVID-19 and/or hospitalization.  2. The significant known and potential risks and benefits of bamlanivimab and casirivimab\imdevimab, and the extent to which such potential risks and benefits are unknown.  3. Information on available alternative treatments and the risks and benefits of those alternatives, including clinical trials.  4. Patients treated with bamlanivimab and casirivimab\imdevimab should continue to self-isolate and use infection control measures (e.g., wear mask, isolate, social distance, avoid sharing personal items, clean and disinfect "high touch" surfaces, and frequent handwashing) according to CDC guidelines.   5. The patient or parent/caregiver has the option to  accept or refuse bamlanivimab or casirivimab\imdevimab .  After reviewing this information with the patient, The patient agreed to proceed with receiving the bamlanimivab infusion and will be provided a copy of the Fact sheet prior to receiving the infusion..  Advised patient that after receiving treatment she should wait 90 days before getting her second covid-19 vaccination.   Consuello Masse, DNP, AGNP-C [phone] (Infusion Center Hotline)

## 2019-04-08 NOTE — Telephone Encounter (Signed)
[facility] - Client Nonclinical Telephone Record AccessNurse Client [facility] - Client Client Site [facility] - Night Physician [place], [place] - MD Contact Type Call Who Is Calling Patient / Member / Family / Caregiver Caller Name Samanda Brisendine Caller Phone Number [phone] Patient Name Jasmine Rollins Patient DOB 08/13/1950 Call Type Message Only Information Provided Reason for Call Request for General Office Information Initial Comment Caller states she tested positive for her COVID test over the weekend and needs to know what to do next. Additional Comment Caller states her husband Melvin Reno has also tested positive. His dob-12/25/1949, and they just need some instruction. Disp. Time Disposition Final User 04/08/2019 7:46:05 AM General Information Provided Yes Cunningham, Ebony Call Closed By: Ebony Cunningham Transaction Date/Time: 04/08/2019 7:41:27 AM (ET) 

## 2019-04-09 ENCOUNTER — Other Ambulatory Visit (HOSPITAL_COMMUNITY): Payer: Self-pay

## 2019-04-09 ENCOUNTER — Ambulatory Visit (HOSPITAL_COMMUNITY)
Admission: RE | Admit: 2019-04-09 | Discharge: 2019-04-09 | Disposition: A | Discharge status: Home / Self Care | Payer: Medicare Other | Source: Ambulatory Visit | Attending: Pulmonary Disease | Admitting: Pulmonary Disease

## 2019-04-09 DIAGNOSIS — U071 COVID-19: Secondary | ICD-10-CM | POA: Diagnosis present

## 2019-04-09 DIAGNOSIS — Z23 Encounter for immunization: Secondary | ICD-10-CM | POA: Insufficient documentation

## 2019-04-09 MED ORDER — SODIUM CHLORIDE 0.9 % IV SOLN
INTRAVENOUS | Status: DC | PRN
  Administered 2019-04-09: 250 mL via INTRAVENOUS

## 2019-04-09 MED ORDER — DIPHENHYDRAMINE HCL 50 MG/ML IJ SOLN: 50.0000 mg | Freq: Once | INTRAMUSCULAR | Status: DC | PRN

## 2019-04-09 MED ORDER — METHYLPREDNISOLONE SODIUM SUCC 125 MG IJ SOLR: 125.0000 mg | Freq: Once | INTRAMUSCULAR | Status: DC | PRN

## 2019-04-09 MED ORDER — ALBUTEROL SULFATE HFA 108 (90 BASE) MCG/ACT IN AERS: 2.0000 | INHALATION_SPRAY | Freq: Once | RESPIRATORY_TRACT | Status: DC | PRN

## 2019-04-09 MED ORDER — FAMOTIDINE IN NACL 20-0.9 MG/50ML-% IV SOLN: 20.0000 mg | Freq: Once | INTRAVENOUS | Status: DC | PRN

## 2019-04-09 MED ORDER — SODIUM CHLORIDE 0.9 % IV SOLN
700.0000 mg | Freq: Once | INTRAVENOUS | Status: AC
  Administered 2019-04-09: 13:00:00 700 mg via INTRAVENOUS
  Filled 2019-04-09: qty 20

## 2019-04-09 MED ORDER — EPINEPHRINE 0.3 MG/0.3ML IJ SOAJ: 0.3000 mg | Freq: Once | INTRAMUSCULAR | Status: DC | PRN

## 2019-04-09 NOTE — Discharge Instructions (Signed)
COVID-19 COVID-19 is a respiratory infection that is caused by a virus called severe acute respiratory syndrome coronavirus 2 (SARS-CoV-2). The disease is also known as coronavirus disease or novel coronavirus. In some people, the virus may not cause any symptoms. In others, it may cause a serious infection. The infection can get worse quickly and can lead to complications, such as:  Pneumonia, or infection of the lungs.  Acute respiratory distress syndrome or ARDS. This is a condition in which fluid build-up in the lungs prevents the lungs from filling with air and passing oxygen into the blood.  Acute respiratory failure. This is a condition in which there is not enough oxygen passing from the lungs to the body or when carbon dioxide is not passing from the lungs out of the body.  Sepsis or septic shock. This is a serious bodily reaction to an infection.  Blood clotting problems.  Secondary infections due to bacteria or fungus.  Organ failure. This is when your body's organs stop working. The virus that causes COVID-19 is contagious. This means that it can spread from person to person through droplets from coughs and sneezes (respiratory secretions). What are the causes? This illness is caused by a virus. You may catch the virus by:  Breathing in droplets from an infected person. Droplets can be spread by a person breathing, speaking, singing, coughing, or sneezing.  Touching something, like a table or a doorknob, that was exposed to the virus (contaminated) and then touching your mouth, nose, or eyes. What increases the risk? Risk for infection You are more likely to be infected with this virus if you:  Are within 6 feet (2 meters) of a person with COVID-19.  Provide care for or live with a person who is infected with COVID-19.  Spend time in crowded indoor spaces or live in shared housing. Risk for serious illness You are more likely to become seriously ill from the virus if you:   Are 50 years of age or older. The higher your age, the more you are at risk for serious illness.  Live in a nursing home or long-term care facility.  Have cancer.  Have a long-term (chronic) disease such as: ? Chronic lung disease, including chronic obstructive pulmonary disease or asthma. ? A long-term disease that lowers your body's ability to fight infection (immunocompromised). ? Heart disease, including heart failure, a condition in which the arteries that lead to the heart become narrow or blocked (coronary artery disease), a disease which makes the heart muscle thick, weak, or stiff (cardiomyopathy). ? Diabetes. ? Chronic kidney disease. ? Sickle cell disease, a condition in which red blood cells have an abnormal "sickle" shape. ? Liver disease.  Are obese. What are the signs or symptoms? Symptoms of this condition can range from mild to severe. Symptoms may appear any time from 2 to 14 days after being exposed to the virus. They include:  A fever or chills.  A cough.  Difficulty breathing.  Headaches, body aches, or muscle aches.  Runny or stuffy (congested) nose.  A sore throat.  New loss of taste or smell. Some people may also have stomach problems, such as nausea, vomiting, or diarrhea. Other people may not have any symptoms of COVID-19. How is this diagnosed? This condition may be diagnosed based on:  Your signs and symptoms, especially if: ? You live in an area with a COVID-19 outbreak. ? You recently traveled to or from an area where the virus is common. ? You   provide care for or live with a person who was diagnosed with COVID-19. ? You were exposed to a person who was diagnosed with COVID-19.  A physical exam.  Lab tests, which may include: ? Taking a sample of fluid from the back of your nose and throat (nasopharyngeal fluid), your nose, or your throat using a swab. ? A sample of mucus from your lungs (sputum). ? Blood tests.  Imaging tests, which  may include, X-rays, CT scan, or ultrasound. How is this treated? At present, there is no medicine to treat COVID-19. Medicines that treat other diseases are being used on a trial basis to see if they are effective against COVID-19. Your health care provider will talk with you about ways to treat your symptoms. For most people, the infection is mild and can be managed at home with rest, fluids, and over-the-counter medicines. Treatment for a serious infection usually takes places in a hospital intensive care unit (ICU). It may include one or more of the following treatments. These treatments are given until your symptoms improve.  Receiving fluids and medicines through an IV.  Supplemental oxygen. Extra oxygen is given through a tube in the nose, a face mask, or a hood.  Positioning you to lie on your stomach (prone position). This makes it easier for oxygen to get into the lungs.  Continuous positive airway pressure (CPAP) or bi-level positive airway pressure (BPAP) machine. This treatment uses mild air pressure to keep the airways open. A tube that is connected to a motor delivers oxygen to the body.  Ventilator. This treatment moves air into and out of the lungs by using a tube that is placed in your windpipe.  Tracheostomy. This is a procedure to create a hole in the neck so that a breathing tube can be inserted.  Extracorporeal membrane oxygenation (ECMO). This procedure gives the lungs a chance to recover by taking over the functions of the heart and lungs. It supplies oxygen to the body and removes carbon dioxide. Follow these instructions at home: Lifestyle  If you are sick, stay home except to get medical care. Your health care provider will tell you how long to stay home. Call your health care provider before you go for medical care.  Rest at home as told by your health care provider.  Do not use any products that contain nicotine or tobacco, such as cigarettes, e-cigarettes, and  chewing tobacco. If you need help quitting, ask your health care provider.  Return to your normal activities as told by your health care provider. Ask your health care provider what activities are safe for you. General instructions  Take over-the-counter and prescription medicines only as told by your health care provider.  Drink enough fluid to keep your urine pale yellow.  Keep all follow-up visits as told by your health care provider. This is important. How is this prevented?  There is no vaccine to help prevent COVID-19 infection. However, there are steps you can take to protect yourself and others from this virus. To protect yourself:   Do not travel to areas where COVID-19 is a risk. The areas where COVID-19 is reported change often. To identify high-risk areas and travel restrictions, check the CDC travel website: wwwnc.cdc.gov/travel/notices  If you live in, or must travel to, an area where COVID-19 is a risk, take precautions to avoid infection. ? Stay away from people who are sick. ? Wash your hands often with soap and water for 20 seconds. If soap and water   are not available, use an alcohol-based hand sanitizer. ? Avoid touching your mouth, face, eyes, or nose. ? Avoid going out in public, follow guidance from your state and local health authorities. ? If you must go out in public, wear a cloth face covering or face mask. Make sure your mask covers your nose and mouth. ? Avoid crowded indoor spaces. Stay at least 6 feet (2 meters) away from others. ? Disinfect objects and surfaces that are frequently touched every day. This may include:  Counters and tables.  Doorknobs and light switches.  Sinks and faucets.  Electronics, such as phones, remote controls, keyboards, computers, and tablets. To protect others: If you have symptoms of COVID-19, take steps to prevent the virus from spreading to others.  If you think you have a COVID-19 infection, contact your health care  provider right away. Tell your health care team that you think you may have a COVID-19 infection.  Stay home. Leave your house only to seek medical care. Do not use public transport.  Do not travel while you are sick.  Wash your hands often with soap and water for 20 seconds. If soap and water are not available, use alcohol-based hand sanitizer.  Stay away from other members of your household. Let healthy household members care for children and pets, if possible. If you have to care for children or pets, wash your hands often and wear a mask. If possible, stay in your own room, separate from others. Use a different bathroom.  Make sure that all people in your household wash their hands well and often.  Cough or sneeze into a tissue or your sleeve or elbow. Do not cough or sneeze into your hand or into the air.  Wear a cloth face covering or face mask. Make sure your mask covers your nose and mouth. Where to find more information  Centers for Disease Control and Prevention: www.cdc.gov/coronavirus/2019-ncov/index.html  World Health Organization: www.who.int/health-topics/coronavirus Contact a health care provider if:  You live in or have traveled to an area where COVID-19 is a risk and you have symptoms of the infection.  You have had contact with someone who has COVID-19 and you have symptoms of the infection. Get help right away if:  You have trouble breathing.  You have pain or pressure in your chest.  You have confusion.  You have bluish lips and fingernails.  You have difficulty waking from sleep.  You have symptoms that get worse. These symptoms may represent a serious problem that is an emergency. Do not wait to see if the symptoms will go away. Get medical help right away. Call your local emergency services (911 in the [place] Do not drive yourself to the hospital. Let the emergency medical personnel know if you think you have COVID-19. Summary  COVID-19 is a  respiratory infection that is caused by a virus. It is also known as coronavirus disease or novel coronavirus. It can cause serious infections, such as pneumonia, acute respiratory distress syndrome, acute respiratory failure, or sepsis.  The virus that causes COVID-19 is contagious. This means that it can spread from person to person through droplets from breathing, speaking, singing, coughing, or sneezing.  You are more likely to develop a serious illness if you are 50 years of age or older, have a weak immune system, live in a nursing home, or have chronic disease.  There is no medicine to treat COVID-19. Your health care provider will talk with you about ways to treat your symptoms.    Take steps to protect yourself and others from infection. Wash your hands often and disinfect objects and surfaces that are frequently touched every day. Stay away from people who are sick and wear a mask if you are sick. This information is not intended to replace advice given to you by your health care provider. Make sure you discuss any questions you have with your health care provider. Document Revised: 11/30/2018 Document Reviewed: 03/08/2018 Elsevier Patient Education  2020 [employer].  

## 2019-04-09 NOTE — Progress Notes (Signed)
Patient ID: Jasmine Rollins, female   DOB: [birth year], 69 y.o.   MRN: [id number]  Diagnosis: COVID-19  Physician:dr wright  Procedure: [facility] Infusion [facility] Med: bamlanivimab infusion - Provided patient with bamlanimivab fact sheet for patients, parents and caregivers prior to infusion.  Complications: No immediate complications noted.  Discharge: Discharged home   Jasmine Rollins 04/09/2019

## 2019-04-10 ENCOUNTER — Encounter (INDEPENDENT_AMBULATORY_CARE_PROVIDER_SITE_OTHER): Payer: Self-pay

## 2019-04-23 ENCOUNTER — Encounter: Payer: Self-pay | Admitting: Family Medicine

## 2019-06-03 DIAGNOSIS — H40033 Anatomical narrow angle, bilateral: Secondary | ICD-10-CM | POA: Diagnosis not present

## 2019-06-03 DIAGNOSIS — H401131 Primary open-angle glaucoma, bilateral, mild stage: Secondary | ICD-10-CM | POA: Diagnosis not present

## 2019-06-03 DIAGNOSIS — H04123 Dry eye syndrome of bilateral lacrimal glands: Secondary | ICD-10-CM | POA: Diagnosis not present

## 2019-06-03 DIAGNOSIS — H2513 Age-related nuclear cataract, bilateral: Secondary | ICD-10-CM | POA: Diagnosis not present

## 2019-06-06 ENCOUNTER — Encounter: Payer: Self-pay | Admitting: Family Medicine

## 2019-06-18 ENCOUNTER — Encounter: Payer: Self-pay | Admitting: Family Medicine

## 2019-07-12 ENCOUNTER — Encounter: Payer: Self-pay | Admitting: Family Medicine

## 2019-07-16 ENCOUNTER — Encounter: Payer: Self-pay | Admitting: Family Medicine

## 2019-09-20 ENCOUNTER — Encounter: Payer: Self-pay | Admitting: Family Medicine

## 2019-09-20 ENCOUNTER — Ambulatory Visit (INDEPENDENT_AMBULATORY_CARE_PROVIDER_SITE_OTHER): Payer: Medicare Other | Admitting: Family Medicine

## 2019-09-20 ENCOUNTER — Other Ambulatory Visit: Payer: Self-pay

## 2019-09-20 VITALS — BP 122/88 | HR 95 | Temp 97.1°F | Ht 62.0 in | Wt 132.4 lb

## 2019-09-20 DIAGNOSIS — R300 Dysuria: Secondary | ICD-10-CM | POA: Diagnosis not present

## 2019-09-20 DIAGNOSIS — N760 Acute vaginitis: Secondary | ICD-10-CM | POA: Insufficient documentation

## 2019-09-20 LAB — POCT WET PREP (WET MOUNT): Trichomonas Wet Prep HPF POC: ABSENT

## 2019-09-20 LAB — POC URINALSYSI DIPSTICK (AUTOMATED)
Bilirubin, UA: NEGATIVE
Blood, UA: NEGATIVE
Glucose, UA: NEGATIVE
Ketones, UA: NEGATIVE
Leukocytes, UA: NEGATIVE
Nitrite, UA: NEGATIVE
Protein, UA: NEGATIVE
Spec Grav, UA: 1.01 (ref 1.010–1.025)
Urobilinogen, UA: 0.2 E.U./dL
pH, UA: 6 (ref 5.0–8.0)

## 2019-09-20 MED ORDER — METRONIDAZOLE 0.75 % VA GEL: 1.0000 | Freq: Every day | VAGINAL | 0 refills | Status: DC

## 2019-09-20 MED ORDER — FLUCONAZOLE 150 MG PO TABS: ORAL_TABLET | ORAL | 0 refills | Status: DC

## 2019-09-20 NOTE — Progress Notes (Signed)
Subjective:    Patient ID: Jasmine Rollins, female    DOB: November 10, 1950, 69 y.o.   MRN: [id number]  This visit occurred during the SARS-CoV-2 public health emergency.  Safety protocols were in place, including screening questions prior to the visit, additional usage of staff PPE, and extensive cleaning of exam room while observing appropriate contact time as indicated for disinfecting solutions.    HPI Pt presents with urinary symptoms   Wt Readings from Last 3 Encounters:  09/20/19 132 lb 6 oz (60 kg)  04/08/19 130 lb (59 kg)  11/09/18 134 lb 1 oz (60.8 kg)   24.21 kg/m  Pt states that sweet drinks cause urinary burning (at end and after urination)  Mainly juice  Some discomfort when she wipes  Frequent urination- but she drinks a lot  No urgency  No blood in urine   She does not drink diet drinks   No fever No nausea  Feels ok   No flank pain   She drinks selzer water- does not bother her   No vag d/c or itching   She does take bubble baths   Results for orders placed or performed in visit on 09/20/19  POCT Urinalysis Dipstick (Automated)  Result Value Ref Range   Color, UA Yellow    Clarity, UA Clear    Glucose, UA Negative Negative   Bilirubin, UA Neg    Ketones, UA Neg    Spec Grav, UA 1.010 1.010 - 1.025   Blood, UA Neg    pH, UA 6.0 5.0 - 8.0   Protein, UA Negative Negative   Urobilinogen, UA 0.2 0.2 or 1.0 E.U./dL   Nitrite, UA Neg    Leukocytes, UA Negative Negative  POCT Wet Prep ([place])  Result Value Ref Range   Source Wet Prep POC vaginal    WBC, Wet Prep HPF POC many    Bacteria Wet Prep HPF POC Many (A) Few   BACTERIA WET PREP MORPHOLOGY POC     Clue Cells Wet Prep HPF POC Few (A) None   Clue Cells Wet Prep Whiff POC     Yeast Wet Prep HPF POC Moderate (A) None   KOH Wet Prep POC Moderate (A) None   Trichomonas Wet Prep HPF POC Absent Absent      Patient Active Problem List   Diagnosis Date Noted  . Dysuria 09/20/2019  . Vaginitis  09/20/2019  . COVID-19 04/08/2019  . Cough 10/09/2018  . Right ear pain 06/08/2018  . Vitamin D deficiency 12/06/2015  . GERD (gastroesophageal reflux disease) 06/09/2014  . Encounter for routine gynecological examination 03/03/2014  . Estrogen deficiency 03/03/2014  . Heartburn 03/01/2013  . Gynecological examination 06/22/2010  . Special screening for malignant neoplasms, colon 06/22/2010  . Glaucoma suspect 06/22/2010  . Routine general medical examination at a health care facility 06/03/2010  . Sleep apnea   . Osteoporosis 05/20/2009  . SLEEP APNEA 10/17/2008  . COUGH VARIANT ASTHMA 07/30/2008  . Leukocytopenia 01/29/2008  . Prediabetes 11/27/2007  . HYPERCHOLESTEROLEMIA 03/21/2007  . Essential hypertension 03/21/2007  . Sinusitis, chronic 03/21/2007  . Allergic rhinitis 03/21/2007  . DIVERTICULOSIS, COLON 03/21/2007   Past Medical History:  Diagnosis Date  . Allergic rhinitis   . Arthritis    hip  . Borderline high cholesterol   . Cataract   . Cough variant asthma   . Diverticulosis of colon   . GERD (gastroesophageal reflux disease)   . HTN (hypertension)   . Hyperglycemia  borderline DM  . OP (osteoporosis)   . Seasonal allergies   . Sleep apnea    no cpap   Past Surgical History:  Procedure Laterality Date  . COLONOSCOPY    . COLPOSCOPY    . ESOPHAGOGASTRODUODENOSCOPY  10/01   Negative  . ROTATOR CUFF REPAIR     right  . TUBAL LIGATION     Social History   Tobacco Use  . Smoking status: Never Smoker  . Smokeless tobacco: Never Used  . Tobacco comment: Remote 2nd hand exposure  Vaping Use  . Vaping Use: Never used  Substance Use Topics  . Alcohol use: No    Alcohol/week: 0.0 standard drinks  . Drug use: No   Family History  Problem Relation Age of Onset  . Hypertension Father   . Diabetes Father   . Diabetes Brother   . Diabetes Brother   . Cancer Brother        prostate  . Cancer Brother        prostate  . Cancer Brother         prostate  . Cancer Brother        prostate  . Cancer Brother        prostate  . Cancer Brother        prostate  . Breast cancer Maternal Aunt   . Colon cancer Neg Hx    Allergies  Allergen Reactions  . Augmentin [Amoxicillin-Pot Clavulanate]     GI upset   . Sulfonamide Derivatives Rash   Current Outpatient Medications on File Prior to Visit  Medication Sig Dispense Refill  . albuterol (PROVENTIL HFA) 108 (90 Base) MCG/ACT inhaler INHALE 2 PUFFS EVERY 4 HOURS AS NEEDED 18 g 3  . Calcium Carb-Cholecalciferol (CALCIUM 600+D3) 600-800 MG-UNIT TABS Take by mouth.    . fluticasone (FLONASE) 50 MCG/ACT nasal spray Place 2 sprays into both nostrils daily. 48 g 3  . fluticasone (FLOVENT HFA) 44 MCG/ACT inhaler USE 2 INHALATIONS TWICE A DAY (RINSE MOUTH AFTER USE) 31.8 g 3  . glucose blood (FREESTYLE LITE) test strip To check glucose daily as needed for early diabetes 2 100 each 0  . latanoprost (XALATAN) 0.005 % ophthalmic solution Place 1 drop into both eyes at bedtime.     . montelukast (SINGULAIR) 10 MG tablet Take 1 tablet (10 mg total) by mouth at bedtime. 90 tablet 3  . olmesartan-hydrochlorothiazide (BENICAR HCT) 40-12.5 MG tablet Take 1 tablet by mouth daily. 90 tablet 3   No current facility-administered medications on file prior to visit.    Review of Systems     Objective:   Physical Exam Exam conducted with a chaperone present.  Constitutional:      General: She is not in acute distress.    Appearance: Normal appearance. She is well-developed and normal weight. She is not ill-appearing or diaphoretic.  HENT:     Head: Normocephalic and atraumatic.  Eyes:     General: No scleral icterus.    Conjunctiva/sclera: Conjunctivae normal.     Pupils: Pupils are equal, round, and reactive to light.  Cardiovascular:     Rate and Rhythm: Normal rate and regular rhythm.     Heart sounds: Normal heart sounds.  Pulmonary:     Effort: Pulmonary effort is normal. No respiratory  distress.     Breath sounds: Normal breath sounds. No wheezing or rales.  Abdominal:     General: Bowel sounds are normal. There is no distension.  Palpations: Abdomen is soft. There is no mass.     Tenderness: There is no abdominal tenderness. There is no guarding or rebound.  Genitourinary:    Pubic Area: No rash.      Labia:        Right: No rash, tenderness or lesion.        Left: No rash, tenderness, lesion or injury.      Vagina: Vaginal discharge present.     Comments: Mild cloudy vaginal d/c  Musculoskeletal:     Cervical back: Normal range of motion and neck supple.  Lymphadenopathy:     Cervical: No cervical adenopathy.  Skin:    General: Skin is warm and dry.     Coloration: Skin is not pale.     Findings: No erythema.  Neurological:     Mental Status: She is alert.           Assessment & Plan:   Problem List Items Addressed This Visit      Genitourinary   Vaginitis - Primary    Causing both vaginal and urinary symptoms  Enc good water intake  Clue cells and hyphae on wet prep  ua is clear  Will tx with diflucan 150 mg times 2 and also metro gel vaginal daily for a week Update if not starting to improve in a week or if worsening        Relevant Orders   POCT Wet Prep [employer]) (Completed)     Other   Dysuria    ua is clear  Suspect from vaginitis -will tx and update       Relevant Orders   POCT Urinalysis Dipstick (Automated) (Completed)

## 2019-09-20 NOTE — Patient Instructions (Addendum)
I think vaginitis is causing your symptoms   Take the diflucan and use the metro gel vaginal gel as directed   Drink lots of water Avoid juices if they bother you   Update if not starting to improve in a week or if worsening

## 2019-09-22 NOTE — Assessment & Plan Note (Signed)
ua is clear  Suspect from vaginitis -will tx and update

## 2019-09-22 NOTE — Assessment & Plan Note (Signed)
Causing both vaginal and urinary symptoms  Enc good water intake  Clue cells and hyphae on wet prep  ua is clear  Will tx with diflucan 150 mg times 2 and also metro gel vaginal daily for a week Update if not starting to improve in a week or if worsening

## 2019-09-30 ENCOUNTER — Encounter: Payer: Self-pay | Admitting: Family Medicine

## 2019-10-02 MED ORDER — FLUCONAZOLE 150 MG PO TABS: ORAL_TABLET | ORAL | 0 refills | Status: DC

## 2019-11-11 DIAGNOSIS — H401131 Primary open-angle glaucoma, bilateral, mild stage: Secondary | ICD-10-CM | POA: Diagnosis not present

## 2019-11-11 DIAGNOSIS — H40033 Anatomical narrow angle, bilateral: Secondary | ICD-10-CM | POA: Diagnosis not present

## 2019-11-20 ENCOUNTER — Other Ambulatory Visit: Payer: Self-pay | Admitting: Family Medicine

## 2019-11-20 NOTE — Telephone Encounter (Signed)
Last CPE was 11/09/18, pt has had multiple acute appts (has one scheduled on Friday 11/22/19), but no recent or future f/u or CPE scheduled

## 2019-11-22 ENCOUNTER — Ambulatory Visit (INDEPENDENT_AMBULATORY_CARE_PROVIDER_SITE_OTHER): Payer: Medicare Other | Admitting: Family Medicine

## 2019-11-22 ENCOUNTER — Encounter: Payer: Self-pay | Admitting: Family Medicine

## 2019-11-22 ENCOUNTER — Ambulatory Visit (INDEPENDENT_AMBULATORY_CARE_PROVIDER_SITE_OTHER)
Admission: RE | Admit: 2019-11-22 | Discharge: 2019-11-22 | Disposition: A | Discharge status: Home / Self Care | Payer: Medicare Other | Source: Ambulatory Visit | Attending: Family Medicine | Admitting: Family Medicine

## 2019-11-22 ENCOUNTER — Other Ambulatory Visit: Payer: Self-pay

## 2019-11-22 DIAGNOSIS — M25561 Pain in right knee: Secondary | ICD-10-CM | POA: Insufficient documentation

## 2019-11-22 MED ORDER — MELOXICAM 15 MG PO TABS: 15.0000 mg | ORAL_TABLET | Freq: Every day | ORAL | 1 refills | Status: DC | PRN

## 2019-11-22 NOTE — Patient Instructions (Addendum)
Start using ice 15 minutes to knee when you can  Don't do that make it hurt   Xray now  Take meloxicam with food daily  A compression sleeve or brace helps when you are active   Look at the knee exercises   We will get back to you with results

## 2019-11-22 NOTE — Progress Notes (Signed)
Subjective:    Patient ID: Jasmine Rollins, female    DOB: 1950-07-20, 69 y.o.   MRN: [id number]  This visit occurred during the SARS-CoV-2 public health emergency.  Safety protocols were in place, including screening questions prior to the visit, additional usage of staff PPE, and extensive cleaning of exam room while observing appropriate contact time as indicated for disinfecting solutions.    HPI Pt presents with R knee pain   Wt Readings from Last 3 Encounters:  11/22/19 132 lb 1 oz (59.9 kg)  09/20/19 132 lb 6 oz (60 kg)  04/08/19 130 lb (59 kg)   24.15 kg/m  Has seen Dr Yisroel Ramming for this knee in the past  Started after she used a machine at gym (leg extension)- that was a few years ago   It got better- ? PT  No new injury - but it just started to bother her again  She is walking regularly for exercise  Sidewise motion and pivoting makes it worse   Just a little swollen  Pain is worse medially  Hurt at night- it keeps her up at night  She used her husband's TENS unit - it helps when she uses it  Has not used heat/ice No otc medicines   Patient Active Problem List   Diagnosis Date Noted  . Right knee pain 11/22/2019  . Dysuria 09/20/2019  . Vaginitis 09/20/2019  . COVID-19 04/08/2019  . Cough 10/09/2018  . Vitamin D deficiency 12/06/2015  . GERD (gastroesophageal reflux disease) 06/09/2014  . Encounter for routine gynecological examination 03/03/2014  . Estrogen deficiency 03/03/2014  . Heartburn 03/01/2013  . Gynecological examination 06/22/2010  . Special screening for malignant neoplasms, colon 06/22/2010  . Glaucoma suspect 06/22/2010  . Routine general medical examination at a health care facility 06/03/2010  . Sleep apnea   . Osteoporosis 05/20/2009  . SLEEP APNEA 10/17/2008  . COUGH VARIANT ASTHMA 07/30/2008  . Leukocytopenia 01/29/2008  . Prediabetes 11/27/2007  . HYPERCHOLESTEROLEMIA 03/21/2007  . Essential hypertension 03/21/2007  . Sinusitis,  chronic 03/21/2007  . Allergic rhinitis 03/21/2007  . DIVERTICULOSIS, COLON 03/21/2007   Past Medical History:  Diagnosis Date  . Allergic rhinitis   . Arthritis    hip  . Borderline high cholesterol   . Cataract   . Cough variant asthma   . Diverticulosis of colon   . GERD (gastroesophageal reflux disease)   . HTN (hypertension)   . Hyperglycemia    borderline DM  . OP (osteoporosis)   . Seasonal allergies   . Sleep apnea    no cpap   Past Surgical History:  Procedure Laterality Date  . COLONOSCOPY    . COLPOSCOPY    . ESOPHAGOGASTRODUODENOSCOPY  10/01   Negative  . ROTATOR CUFF REPAIR     right  . TUBAL LIGATION     Social History   Tobacco Use  . Smoking status: Never Smoker  . Smokeless tobacco: Never Used  . Tobacco comment: Remote 2nd hand exposure  Vaping Use  . Vaping Use: Never used  Substance Use Topics  . Alcohol use: No    Alcohol/week: 0.0 standard drinks  . Drug use: No   Family History  Problem Relation Age of Onset  . Hypertension Father   . Diabetes Father   . Diabetes Brother   . Diabetes Brother   . Cancer Brother        prostate  . Cancer Brother        prostate  .  Cancer Brother        prostate  . Cancer Brother        prostate  . Cancer Brother        prostate  . Cancer Brother        prostate  . Breast cancer Maternal Aunt   . Colon cancer Neg Hx    Allergies  Allergen Reactions  . Augmentin [Amoxicillin-Pot Clavulanate]     GI upset   . Sulfonamide Derivatives Rash   Current Outpatient Medications on File Prior to Visit  Medication Sig Dispense Refill  . albuterol (PROVENTIL HFA) 108 (90 Base) MCG/ACT inhaler INHALE 2 PUFFS EVERY 4 HOURS AS NEEDED 18 g 3  . BENICAR HCT 40-12.5 MG tablet TAKE 1 TABLET DAILY 90 tablet 0  . Calcium Carb-Cholecalciferol (CALCIUM 600+D3) 600-800 MG-UNIT TABS Take by mouth.    . fluticasone (FLONASE) 50 MCG/ACT nasal spray Place 2 sprays into both nostrils daily. 48 g 3  . fluticasone  (FLOVENT HFA) 44 MCG/ACT inhaler USE 2 INHALATIONS TWICE A DAY (RINSE MOUTH AFTER USE) 31.8 g 3  . glucose blood (FREESTYLE LITE) test strip To check glucose daily as needed for early diabetes 2 100 each 0  . latanoprost (XALATAN) 0.005 % ophthalmic solution Place 1 drop into both eyes at bedtime.     . montelukast (SINGULAIR) 10 MG tablet TAKE 1 TABLET AT BEDTIME 90 tablet 0   No current facility-administered medications on file prior to visit.    Review of Systems  Constitutional: Negative for activity change, appetite change, fatigue, fever and unexpected weight change.  HENT: Negative for congestion, ear pain, rhinorrhea, sinus pressure and sore throat.   Eyes: Negative for pain, redness and visual disturbance.  Respiratory: Negative for cough, shortness of breath and wheezing.   Cardiovascular: Negative for chest pain and palpitations.  Gastrointestinal: Negative for abdominal pain, blood in stool, constipation and diarrhea.  Endocrine: Negative for polydipsia and polyuria.  Genitourinary: Negative for dysuria, frequency and urgency.  Musculoskeletal: Negative for arthralgias, back pain and myalgias.       Knee pain   Skin: Negative for pallor and rash.  Allergic/Immunologic: Negative for environmental allergies.  Neurological: Negative for dizziness, syncope and headaches.  Hematological: Negative for adenopathy. Does not bruise/bleed easily.  Psychiatric/Behavioral: Negative for decreased concentration and dysphoric mood. The patient is not nervous/anxious.        Objective:   Physical Exam Constitutional:      General: She is not in acute distress.    Appearance: Normal appearance. She is normal weight. She is not ill-appearing.  Eyes:     Conjunctiva/sclera: Conjunctivae normal.     Pupils: Pupils are equal, round, and reactive to light.  Cardiovascular:     Rate and Rhythm: Normal rate and regular rhythm.     Pulses: Normal pulses.  Musculoskeletal:        General: No  tenderness.     Comments: Knee  No effusion, mild swelling in patellofemoral area No warmth to the touch  No crepitus  ROM:  Flex 90 deg with pain  Ext -nl mcmurray-neg Bounce test -slt pain   Stability: Anterior drawer-nl Lachman exam -nl  Tenderness patellar femoral and medial joint line  Gait nl    Skin:    General: Skin is warm and dry.     Findings: No erythema or rash.  Neurological:     Mental Status: She is alert.     Sensory: No sensory deficit.  Coordination: Coordination normal.     Deep Tendon Reflexes: Reflexes normal.  Psychiatric:        Mood and Affect: Mood normal.           Assessment & Plan:   Problem List Items Addressed This Visit      Other   Right knee pain    Acute/recurrent  2 y ago-injured with extension machine/gym  Mild swelling and tender patellofemoral and medial joint line  Xray today  meloxicam daily with food  Ice  Compression sleeve (she has) Rehab handout given / can use exercise bike If no improvement will send back to ortho (Dr Yisroel Ramming)      Relevant Orders   DG Knee 4 Views W/Patella Right (Completed)

## 2019-11-22 NOTE — Assessment & Plan Note (Signed)
Acute/recurrent  2 y ago-injured with extension machine/gym  Mild swelling and tender patellofemoral and medial joint line  Xray today  meloxicam daily with food  Ice  Compression sleeve (she has) Rehab handout given / can use exercise bike If no improvement will send back to ortho (Dr Yisroel Ramming)

## 2019-11-25 ENCOUNTER — Telehealth: Payer: Self-pay | Admitting: Family Medicine

## 2019-11-25 DIAGNOSIS — M25561 Pain in right knee: Secondary | ICD-10-CM

## 2019-11-25 NOTE — Telephone Encounter (Signed)
Referral done Will send to [facility] 

## 2019-11-25 NOTE — Telephone Encounter (Signed)
-----   Message from Shon Millet, [place] sent at 11/25/2019 12:39 PM EDT ----- Pt notified of xray results and Dr. Royden Purl comments. Pt said it's been a long time since she was seen at Ortho she needs a new referral. Pt would like to go back and see Dr. Jerl Santos at [facility] in [place] if possible. Pt advise our [facility] will call to schedule appt, please put referral in

## 2019-12-02 NOTE — Telephone Encounter (Signed)
Referral sent 

## 2019-12-03 ENCOUNTER — Encounter: Payer: Self-pay | Admitting: Family Medicine

## 2019-12-06 ENCOUNTER — Encounter: Payer: Self-pay | Admitting: Family Medicine

## 2019-12-06 ENCOUNTER — Telehealth: Payer: Self-pay | Admitting: *Deleted

## 2019-12-06 DIAGNOSIS — M25561 Pain in right knee: Secondary | ICD-10-CM | POA: Diagnosis not present

## 2019-12-06 MED ORDER — AMLODIPINE BESYLATE 5 MG PO TABS: 5.0000 mg | ORAL_TABLET | Freq: Every day | ORAL | 0 refills | Status: DC

## 2019-12-06 NOTE — Telephone Encounter (Signed)
I pended amlodipine to send to pharmacy of choice  One daily  If side effects stop and update

## 2019-12-06 NOTE — Telephone Encounter (Signed)
Pt scheduled an appt on 12/10/19 for BP check but pt's BP today is 159/110, pt not sure if she should wait till Thursday with that high BP or if PCP can recommend her to do something until appt

## 2019-12-06 NOTE — Telephone Encounter (Signed)
Addressed through phone note 

## 2019-12-06 NOTE — Telephone Encounter (Signed)
Pt notified Rx sent to pharmacy will start med asap and keep [place] posted. Will keep f/u with PCP for Tuesday

## 2019-12-06 NOTE — Telephone Encounter (Signed)
Pt sent a message saying:  Blood pressure reading is 175/105.  she was at her doctor for her knee and that's what it was. When pt took med this morning BP went down to 132/91 but now it's coming back up and pt doesn't know what to do

## 2019-12-06 NOTE — Telephone Encounter (Signed)
The amlodipine will take a little while to fully work but this is very reassuring that it is improved already.  Please stick with it.  If she feels sick or bp goes above 200s /110 then go to UC   Thanks

## 2019-12-06 NOTE — Telephone Encounter (Signed)
Pt notified of Dr. Tower's comments and instructions and verbalized understanding  

## 2019-12-10 ENCOUNTER — Encounter: Payer: Self-pay | Admitting: Family Medicine

## 2019-12-10 ENCOUNTER — Ambulatory Visit (INDEPENDENT_AMBULATORY_CARE_PROVIDER_SITE_OTHER): Payer: Medicare Other | Admitting: Family Medicine

## 2019-12-10 ENCOUNTER — Other Ambulatory Visit: Payer: Self-pay

## 2019-12-10 VITALS — BP 148/80 | HR 90 | Temp 97.0°F | Ht 62.0 in | Wt 132.4 lb

## 2019-12-10 DIAGNOSIS — Z23 Encounter for immunization: Secondary | ICD-10-CM

## 2019-12-10 DIAGNOSIS — I10 Essential (primary) hypertension: Secondary | ICD-10-CM | POA: Diagnosis not present

## 2019-12-10 MED ORDER — AMLODIPINE BESYLATE 5 MG PO TABS: 5.0000 mg | ORAL_TABLET | Freq: Every day | ORAL | 5 refills | Status: DC

## 2019-12-10 MED ORDER — FREESTYLE LITE TEST VI STRP: ORAL_STRIP | 3 refills | Status: DC

## 2019-12-10 NOTE — Patient Instructions (Signed)
Blood pressure is reassuring  You can stop checking it so often   Eat less processed food /salty foods  Cut back on caffeine Avoid nsaids like advil   Drink lots of water Start back on amlodipine 1/2 pill daily for the next 2 weeks Then check blood pressure to see if over 140/90  If it is high then increase to a whole pill per day  If any side effects , low bp or dizziness please let me know   Take care of yourself   Follow up in 4-6 weeks

## 2019-12-10 NOTE — Assessment & Plan Note (Signed)
BP: (!) 148/80    Pt initially had elevated bp at home- then with amlodipine 5 mg addition, some dizziness /? Hypotension Today-holding it and bp is back up  Anxiety and caffeine may add to elevation / also meloxicam  Plan to continue amlodipine but at reduced dose of 2.5 mg to start and then titrate up to 5 in 2 wk if still elevated  Continue benicar hct 40-12.5 w/o change  Then f/u in office Warned to limit freq bp checks if they make her anxious

## 2019-12-10 NOTE — Progress Notes (Signed)
Subjective:    Patient ID: Jasmine Rollins, female    DOB: [birth year], 69 y.o.   MRN: [id number]  This visit occurred during the SARS-CoV-2 public health emergency.  Safety protocols were in place, including screening questions prior to the visit, additional usage of staff PPE, and extensive cleaning of exam room while observing appropriate contact time as indicated for disinfecting solutions.    HPI  Pt presents with elevated BP  Wt Readings from Last 3 Encounters:  12/10/19 132 lb 6 oz (60 kg)  11/22/19 132 lb 1 oz (59.9 kg)  09/20/19 132 lb 6 oz (60 kg)   24.21 kg/m  ? What made her bp go up  She has been drinking sasafrass tea (? If caffeine)  Some coffee -lately more than usual  Also takes some advil for her knee   bp was 180 systolic at the orthopedic office   We sent in amlodipine on Friday  159/110 -took amlodipine and it went down to 130s systolic  147/98 mid day Saturday  Sunday 133/92 in the am  Felt a little dizzy on Monday   123/84 , then 116/78  She has a wrist cuff    HTN Pt takes amlodipine 5 mg daily benicar 40-12.5 mg daily   BP Readings from Last 3 Encounters:  12/10/19 (!) 146/90  11/22/19 (!) 140/92  09/20/19 122/88   She did not take the amlodipine today or yesterday    Pulse Readings from Last 3 Encounters:  12/10/19 90  11/22/19 77  09/20/19 95   Lab Results  Component Value Date   CREATININE 0.67 11/06/2018   BUN 10 11/06/2018   NA 142 11/06/2018   K 4.7 11/06/2018   CL 104 11/06/2018   CO2 32 11/06/2018   Patient Active Problem List   Diagnosis Date Noted  . Right knee pain 11/22/2019  . Dysuria 09/20/2019  . Vaginitis 09/20/2019  . COVID-19 04/08/2019  . Cough 10/09/2018  . Vitamin D deficiency 12/06/2015  . GERD (gastroesophageal reflux disease) 06/09/2014  . Encounter for routine gynecological examination 03/03/2014  . Estrogen deficiency 03/03/2014  . Heartburn 03/01/2013  . Gynecological examination 06/22/2010  .  Special screening for malignant neoplasms, colon 06/22/2010  . Glaucoma suspect 06/22/2010  . Routine general medical examination at a health care facility 06/03/2010  . Sleep apnea   . Osteoporosis 05/20/2009  . SLEEP APNEA 10/17/2008  . COUGH VARIANT ASTHMA 07/30/2008  . Leukocytopenia 01/29/2008  . Prediabetes 11/27/2007  . HYPERCHOLESTEROLEMIA 03/21/2007  . Essential hypertension 03/21/2007  . Sinusitis, chronic 03/21/2007  . Allergic rhinitis 03/21/2007  . DIVERTICULOSIS, COLON 03/21/2007   Past Medical History:  Diagnosis Date  . Allergic rhinitis   . Arthritis    hip  . Borderline high cholesterol   . Cataract   . Cough variant asthma   . Diverticulosis of colon   . GERD (gastroesophageal reflux disease)   . HTN (hypertension)   . Hyperglycemia    borderline DM  . OP (osteoporosis)   . Seasonal allergies   . Sleep apnea    no cpap   Past Surgical History:  Procedure Laterality Date  . COLONOSCOPY    . COLPOSCOPY    . ESOPHAGOGASTRODUODENOSCOPY  10/01   Negative  . ROTATOR CUFF REPAIR     right  . TUBAL LIGATION     Social History   Tobacco Use  . Smoking status: Never Smoker  . Smokeless tobacco: Never Used  . Tobacco comment: Remote 2nd  hand exposure  Vaping Use  . Vaping Use: Never used  Substance Use Topics  . Alcohol use: No    Alcohol/week: 0.0 standard drinks  . Drug use: No   Family History  Problem Relation Age of Onset  . Hypertension Father   . Diabetes Father   . Diabetes Brother   . Diabetes Brother   . Cancer Brother        prostate  . Cancer Brother        prostate  . Cancer Brother        prostate  . Cancer Brother        prostate  . Cancer Brother        prostate  . Cancer Brother        prostate  . Breast cancer Maternal Aunt   . Colon cancer Neg Hx    Allergies  Allergen Reactions  . Augmentin [Amoxicillin-Pot Clavulanate]     GI upset   . Sulfonamide Derivatives Rash   Current Outpatient Medications on File  Prior to Visit  Medication Sig Dispense Refill  . albuterol (PROVENTIL HFA) 108 (90 Base) MCG/ACT inhaler INHALE 2 PUFFS EVERY 4 HOURS AS NEEDED 18 g 3  . BENICAR HCT 40-12.5 MG tablet TAKE 1 TABLET DAILY 90 tablet 0  . Calcium Carb-Cholecalciferol (CALCIUM 600+D3) 600-800 MG-UNIT TABS Take by mouth.    . fluticasone (FLONASE) 50 MCG/ACT nasal spray Place 2 sprays into both nostrils daily. 48 g 3  . fluticasone (FLOVENT HFA) 44 MCG/ACT inhaler USE 2 INHALATIONS TWICE A DAY (RINSE MOUTH AFTER USE) 31.8 g 3  . latanoprost (XALATAN) 0.005 % ophthalmic solution Place 1 drop into both eyes at bedtime.     . meloxicam (MOBIC) 15 MG tablet Take 1 tablet (15 mg total) by mouth daily as needed for pain. With food 30 tablet 1  . montelukast (SINGULAIR) 10 MG tablet TAKE 1 TABLET AT BEDTIME 90 tablet 0   No current facility-administered medications on file prior to visit.      Review of Systems  Constitutional: Negative for activity change, appetite change, fatigue, fever and unexpected weight change.  HENT: Negative for congestion, ear pain, rhinorrhea, sinus pressure and sore throat.   Eyes: Negative for pain, redness and visual disturbance.  Respiratory: Negative for cough, shortness of breath and wheezing.   Cardiovascular: Negative for chest pain and palpitations.  Gastrointestinal: Negative for abdominal pain, blood in stool, constipation and diarrhea.  Endocrine: Negative for polydipsia and polyuria.  Genitourinary: Negative for dysuria, frequency and urgency.  Musculoskeletal: Positive for arthralgias. Negative for back pain and myalgias.       Knee pain  Skin: Negative for pallor and rash.  Allergic/Immunologic: Negative for environmental allergies.  Neurological: Negative for dizziness, syncope and headaches.       Occ feels pressure in head when bp is up   Hematological: Negative for adenopathy. Does not bruise/bleed easily.  Psychiatric/Behavioral: Negative for decreased concentration  and dysphoric mood. The patient is not nervous/anxious.        Objective:   Physical Exam Constitutional:      General: She is not in acute distress.    Appearance: She is well-developed and normal weight. She is not ill-appearing or diaphoretic.  HENT:     Head: Normocephalic and atraumatic.     Mouth/Throat:     Mouth: Mucous membranes are moist.  Eyes:     General: No scleral icterus.    Conjunctiva/sclera: Conjunctivae normal.  Pupils: Pupils are equal, round, and reactive to light.  Neck:     Thyroid: No thyromegaly.     Vascular: No carotid bruit or JVD.  Cardiovascular:     Rate and Rhythm: Normal rate and regular rhythm.     Heart sounds: Normal heart sounds. No gallop.   Pulmonary:     Effort: Pulmonary effort is normal. No respiratory distress.     Breath sounds: Normal breath sounds. No wheezing or rales.  Abdominal:     General: Bowel sounds are normal. There is no distension or abdominal bruit.     Palpations: Abdomen is soft. There is no mass.     Tenderness: There is no abdominal tenderness.  Musculoskeletal:     Cervical back: Normal range of motion and neck supple. No tenderness.     Right lower leg: No edema.     Left lower leg: No edema.  Lymphadenopathy:     Cervical: No cervical adenopathy.  Skin:    General: Skin is warm and dry.     Coloration: Skin is not pale.     Findings: No erythema or rash.  Neurological:     Mental Status: She is alert.     Cranial Nerves: No cranial nerve deficit.     Coordination: Coordination normal.     Deep Tendon Reflexes: Reflexes are normal and symmetric. Reflexes normal.  Psychiatric:        Mood and Affect: Mood normal.           Assessment & Plan:   Problem List Items Addressed This Visit      Cardiovascular and Mediastinum   Essential hypertension    BP: (!) 148/80    Pt initially had elevated bp at home- then with amlodipine 5 mg addition, some dizziness /? Hypotension Today-holding it and  bp is back up  Anxiety and caffeine may add to elevation / also meloxicam  Plan to continue amlodipine but at reduced dose of 2.5 mg to start and then titrate up to 5 in 2 wk if still elevated  Continue benicar hct 40-12.5 w/o change  Then f/u in office Warned to limit freq bp checks if they make her anxious        Relevant Medications   amLODipine (NORVASC) 5 MG tablet    Other Visit Diagnoses    Need for influenza vaccination    -  Primary   Relevant Orders   Flu Vaccine QUAD High Dose(Fluad) (Completed)

## 2019-12-16 DIAGNOSIS — M23206 Derangement of unspecified meniscus due to old tear or injury, right knee: Secondary | ICD-10-CM | POA: Diagnosis not present

## 2019-12-16 DIAGNOSIS — M6281 Muscle weakness (generalized): Secondary | ICD-10-CM | POA: Diagnosis not present

## 2019-12-17 ENCOUNTER — Other Ambulatory Visit: Payer: Self-pay | Admitting: Family Medicine

## 2019-12-17 DIAGNOSIS — Z1231 Encounter for screening mammogram for malignant neoplasm of breast: Secondary | ICD-10-CM

## 2019-12-24 DIAGNOSIS — M23206 Derangement of unspecified meniscus due to old tear or injury, right knee: Secondary | ICD-10-CM | POA: Diagnosis not present

## 2019-12-24 DIAGNOSIS — M6281 Muscle weakness (generalized): Secondary | ICD-10-CM | POA: Diagnosis not present

## 2019-12-31 DIAGNOSIS — M6281 Muscle weakness (generalized): Secondary | ICD-10-CM | POA: Diagnosis not present

## 2019-12-31 DIAGNOSIS — M23206 Derangement of unspecified meniscus due to old tear or injury, right knee: Secondary | ICD-10-CM | POA: Diagnosis not present

## 2020-01-04 ENCOUNTER — Other Ambulatory Visit: Payer: Self-pay | Admitting: Family Medicine

## 2020-01-08 DIAGNOSIS — M23206 Derangement of unspecified meniscus due to old tear or injury, right knee: Secondary | ICD-10-CM | POA: Diagnosis not present

## 2020-01-08 DIAGNOSIS — M6281 Muscle weakness (generalized): Secondary | ICD-10-CM | POA: Diagnosis not present

## 2020-01-13 DIAGNOSIS — M6281 Muscle weakness (generalized): Secondary | ICD-10-CM | POA: Diagnosis not present

## 2020-01-13 DIAGNOSIS — M23206 Derangement of unspecified meniscus due to old tear or injury, right knee: Secondary | ICD-10-CM | POA: Diagnosis not present

## 2020-01-14 ENCOUNTER — Ambulatory Visit (INDEPENDENT_AMBULATORY_CARE_PROVIDER_SITE_OTHER): Payer: Medicare Other | Admitting: Family Medicine

## 2020-01-14 ENCOUNTER — Other Ambulatory Visit: Payer: Self-pay

## 2020-01-14 ENCOUNTER — Encounter: Payer: Self-pay | Admitting: Family Medicine

## 2020-01-14 VITALS — BP 135/85 | HR 84 | Temp 96.9°F | Ht 62.0 in | Wt 133.2 lb

## 2020-01-14 DIAGNOSIS — I10 Essential (primary) hypertension: Secondary | ICD-10-CM | POA: Diagnosis not present

## 2020-01-14 DIAGNOSIS — L820 Inflamed seborrheic keratosis: Secondary | ICD-10-CM | POA: Diagnosis not present

## 2020-01-14 MED ORDER — AMLODIPINE BESYLATE 5 MG PO TABS: 5.0000 mg | ORAL_TABLET | Freq: Every day | ORAL | 3 refills | Status: DC

## 2020-01-14 NOTE — Patient Instructions (Addendum)
Blood pressure is better on the 2nd check  Continue current medications   Keep watching it at home  (lower than it is here)  Eat a healthy diet and stay active   Take care of herself   You can use cortisone cream on the keratosis on your breast  We will watch this

## 2020-01-14 NOTE — Assessment & Plan Note (Signed)
bp is better at home than here but improved today with re start of amlodipine  bp in fair control at this time  BP Readings from Last 1 Encounters:  01/14/20 135/85   No changes needed Most recent labs reviewed  Disc lifstyle change with low sodium diet and exercise   Plan to continue amlodipine 5 mg daily  benicar hc 40-12.5 mg daily

## 2020-01-14 NOTE — Assessment & Plan Note (Signed)
1 cm oval inflamed SK on lateral R breast  Pt has been rubbing it/putting otc products on  Nl breast exam   inst to keep clean/ use otc cortisone if needed  If no imp we can tx with cryo Handout given

## 2020-01-14 NOTE — Progress Notes (Signed)
Subjective:    Patient ID: Jasmine Rollins, female    DOB: 1950-04-15, 69 y.o.   MRN: [id number]  This visit occurred during the SARS-CoV-2 public health emergency.  Safety protocols were in place, including screening questions prior to the visit, additional usage of staff PPE, and extensive cleaning of exam room while observing appropriate contact time as indicated for disinfecting solutions.    HPI Pt presents for f/u of HTN and also skin spot on R breast   Wt Readings from Last 3 Encounters:  01/14/20 133 lb 4 oz (60.4 kg)  12/10/19 132 lb 6 oz (60 kg)  11/22/19 132 lb 1 oz (59.9 kg)   24.37 kg/m  HTN  At last visit noted that addition of amlodipine for elevated bp the visit before she developed some dizziness and ?hypotension   Unsure if anxiety and amlodipine added to bp elevation   We dec dose of amlodipine to 2.5 mg daily -then she inc back to 5 mg daily  She also takes benicar hct 40-12.5 mg daily   At home her bp is better than here  117/70 -avg number that she get  No longer getting dizzy    No cp or palpitations or headaches or edema  No side effects to medicines  BP Readings from Last 3 Encounters:  01/14/20 (!) 146/92  12/10/19 (!) 148/80  11/22/19 (!) 140/92     Re check is better BP: 135/85   Pulse Readings from Last 3 Encounters:  01/14/20 84  12/10/19 90  11/22/19 77   Lab Results  Component Value Date   CREATININE 0.67 11/06/2018   BUN 10 11/06/2018   NA 142 11/06/2018   K 4.7 11/06/2018   CL 104 11/06/2018   CO2 32 11/06/2018    Skin spot on her R breast  Started as a blotch  Then became itchy and raise  She used a salve  Going to get mammogram on 12/2   Patient Active Problem List   Diagnosis Date Noted  . Seborrheic keratoses, inflamed 01/14/2020  . Right knee pain 11/22/2019  . Dysuria 09/20/2019  . Vaginitis 09/20/2019  . COVID-19 04/08/2019  . Cough 10/09/2018  . Vitamin D deficiency 12/06/2015  . GERD (gastroesophageal  reflux disease) 06/09/2014  . Encounter for routine gynecological examination 03/03/2014  . Estrogen deficiency 03/03/2014  . Heartburn 03/01/2013  . Gynecological examination 06/22/2010  . Special screening for malignant neoplasms, colon 06/22/2010  . Glaucoma suspect 06/22/2010  . Routine general medical examination at a health care facility 06/03/2010  . Sleep apnea   . Osteoporosis 05/20/2009  . SLEEP APNEA 10/17/2008  . COUGH VARIANT ASTHMA 07/30/2008  . Leukocytopenia 01/29/2008  . Prediabetes 11/27/2007  . HYPERCHOLESTEROLEMIA 03/21/2007  . Essential hypertension 03/21/2007  . Sinusitis, chronic 03/21/2007  . Allergic rhinitis 03/21/2007  . DIVERTICULOSIS, COLON 03/21/2007   Past Medical History:  Diagnosis Date  . Allergic rhinitis   . Arthritis    hip  . Borderline high cholesterol   . Cataract   . Cough variant asthma   . Diverticulosis of colon   . GERD (gastroesophageal reflux disease)   . HTN (hypertension)   . Hyperglycemia    borderline DM  . OP (osteoporosis)   . Seasonal allergies   . Sleep apnea    no cpap   Past Surgical History:  Procedure Laterality Date  . COLONOSCOPY    . COLPOSCOPY    . ESOPHAGOGASTRODUODENOSCOPY  10/01   Negative  . ROTATOR  CUFF REPAIR     right  . TUBAL LIGATION     Social History   Tobacco Use  . Smoking status: Never Smoker  . Smokeless tobacco: Never Used  . Tobacco comment: Remote 2nd hand exposure  Vaping Use  . Vaping Use: Never used  Substance Use Topics  . Alcohol use: No    Alcohol/week: 0.0 standard drinks  . Drug use: No   Family History  Problem Relation Age of Onset  . Hypertension Father   . Diabetes Father   . Diabetes Brother   . Diabetes Brother   . Cancer Brother        prostate  . Cancer Brother        prostate  . Cancer Brother        prostate  . Cancer Brother        prostate  . Cancer Brother        prostate  . Cancer Brother        prostate  . Breast cancer Maternal Aunt     . Colon cancer Neg Hx    Allergies  Allergen Reactions  . Augmentin [Amoxicillin-Pot Clavulanate]     GI upset   . Sulfonamide Derivatives Rash   Current Outpatient Medications on File Prior to Visit  Medication Sig Dispense Refill  . albuterol (PROVENTIL HFA) 108 (90 Base) MCG/ACT inhaler INHALE 2 PUFFS EVERY 4 HOURS AS NEEDED 18 g 3  . BENICAR HCT 40-12.5 MG tablet TAKE 1 TABLET DAILY 90 tablet 0  . Calcium Carb-Cholecalciferol (CALCIUM 600+D3) 600-800 MG-UNIT TABS Take by mouth.    . fluticasone (FLONASE) 50 MCG/ACT nasal spray Place 2 sprays into both nostrils daily. 48 g 3  . fluticasone (FLOVENT HFA) 44 MCG/ACT inhaler USE 2 INHALATIONS TWICE A DAY (RINSE MOUTH AFTER USE) 31.8 g 3  . glucose blood (FREESTYLE LITE) test strip To check glucose daily as needed for early diabetes 2 100 each 3  . latanoprost (XALATAN) 0.005 % ophthalmic solution Place 1 drop into both eyes at bedtime.     . meloxicam (MOBIC) 15 MG tablet Take 1 tablet (15 mg total) by mouth daily as needed for pain. With food 30 tablet 1  . montelukast (SINGULAIR) 10 MG tablet TAKE 1 TABLET AT BEDTIME 90 tablet 0   No current facility-administered medications on file prior to visit.    Review of Systems  Constitutional: Negative for activity change, appetite change, fatigue, fever and unexpected weight change.  HENT: Negative for congestion, ear pain, rhinorrhea, sinus pressure and sore throat.   Eyes: Negative for pain, redness and visual disturbance.  Respiratory: Negative for cough, shortness of breath and wheezing.   Cardiovascular: Negative for chest pain and palpitations.  Gastrointestinal: Negative for abdominal pain, blood in stool, constipation and diarrhea.  Endocrine: Negative for polydipsia and polyuria.  Genitourinary: Negative for dysuria, frequency and urgency.  Musculoskeletal: Negative for arthralgias, back pain and myalgias.  Skin: Negative for pallor and rash.       Skin lesion/itchy   Allergic/Immunologic: Negative for environmental allergies.  Neurological: Negative for dizziness, syncope and headaches.  Hematological: Negative for adenopathy. Does not bruise/bleed easily.  Psychiatric/Behavioral: Negative for decreased concentration and dysphoric mood. The patient is not nervous/anxious.        Objective:   Physical Exam Constitutional:      General: She is not in acute distress.    Appearance: Normal appearance. She is well-developed and normal weight. She is not ill-appearing or diaphoretic.  HENT:     Head: Normocephalic and atraumatic.  Eyes:     Conjunctiva/sclera: Conjunctivae normal.     Pupils: Pupils are equal, round, and reactive to light.  Neck:     Thyroid: No thyromegaly.     Vascular: No carotid bruit or JVD.  Cardiovascular:     Rate and Rhythm: Normal rate and regular rhythm.     Heart sounds: Normal heart sounds. No gallop.   Pulmonary:     Effort: Pulmonary effort is normal. No respiratory distress.     Breath sounds: Normal breath sounds. No wheezing or rales.  Abdominal:     General: Bowel sounds are normal. There is no distension or abdominal bruit.     Palpations: Abdomen is soft. There is no mass.     Tenderness: There is no abdominal tenderness.  Genitourinary:    Comments: R breast- no lumps or changes SK noted Musculoskeletal:     Cervical back: Normal range of motion and neck supple.     Right lower leg: No edema.     Left lower leg: No edema.  Lymphadenopathy:     Cervical: No cervical adenopathy.  Skin:    General: Skin is warm and dry.     Coloration: Skin is not pale.     Findings: No erythema or rash.     Comments: 1 cm oval brown SK on R lateral breast with some erythema surrounding   No rash noted  Neurological:     Mental Status: She is alert.     Cranial Nerves: No cranial nerve deficit.     Deep Tendon Reflexes: Reflexes are normal and symmetric.  Psychiatric:        Mood and Affect: Mood normal.            Assessment & Plan:   Problem List Items Addressed This Visit      Cardiovascular and Mediastinum   Essential hypertension - Primary    bp is better at home than here but improved today with re start of amlodipine  bp in fair control at this time  BP Readings from Last 1 Encounters:  01/14/20 135/85   No changes needed Most recent labs reviewed  Disc lifstyle change with low sodium diet and exercise   Plan to continue amlodipine 5 mg daily  benicar hc 40-12.5 mg daily        Relevant Medications   amLODipine (NORVASC) 5 MG tablet     Musculoskeletal and Integument   Seborrheic keratoses, inflamed    1 cm oval inflamed SK on lateral R breast  Pt has been rubbing it/putting otc products on  Nl breast exam   inst to keep clean/ use otc cortisone if needed  If no imp we can tx with cryo Handout given

## 2020-01-21 DIAGNOSIS — M23206 Derangement of unspecified meniscus due to old tear or injury, right knee: Secondary | ICD-10-CM | POA: Diagnosis not present

## 2020-01-21 DIAGNOSIS — M6281 Muscle weakness (generalized): Secondary | ICD-10-CM | POA: Diagnosis not present

## 2020-01-23 ENCOUNTER — Telehealth: Payer: Self-pay

## 2020-01-23 NOTE — Telephone Encounter (Signed)
It is safe to get it (even ok if she has covid) but then if she has side effects it may be hard to know if it is an imm side effect or covid  As long as she is aware of that and has access to testing-go ahead with the booster

## 2020-01-23 NOTE — Telephone Encounter (Signed)
Vm left at triage.  Pt states she is due to get her COVID booster tomorrow at 2:15.  However, her husband tested positive for COVID today.  Says her test came back negative.  Pt is asking if it's ok for her to go ahead and get booster tomorrow?  Plz advise at [phone].

## 2020-01-24 ENCOUNTER — Other Ambulatory Visit: Payer: Self-pay

## 2020-01-24 ENCOUNTER — Ambulatory Visit
Admission: RE | Admit: 2020-01-24 | Discharge: 2020-01-24 | Disposition: A | Discharge status: Home / Self Care | Payer: Medicare Other | Source: Ambulatory Visit | Attending: Family Medicine | Admitting: Family Medicine

## 2020-01-24 DIAGNOSIS — Z1231 Encounter for screening mammogram for malignant neoplasm of breast: Secondary | ICD-10-CM

## 2020-01-24 NOTE — Telephone Encounter (Signed)
Left detailed message with information below from Dr. Milinda Antis about Covid booster.

## 2020-05-06 ENCOUNTER — Other Ambulatory Visit: Payer: Self-pay | Admitting: Family Medicine

## 2020-05-07 ENCOUNTER — Other Ambulatory Visit: Payer: Self-pay | Admitting: Family Medicine

## 2020-05-07 MED ORDER — MONTELUKAST SODIUM 10 MG PO TABS: 10.0000 mg | ORAL_TABLET | Freq: Every day | ORAL | 1 refills | Status: DC

## 2020-06-03 ENCOUNTER — Telehealth: Payer: Self-pay | Admitting: Family Medicine

## 2020-06-03 NOTE — Progress Notes (Signed)
  Chronic Care Management   Note  06/03/2020 Name: Jasmine Rollins MRN: [id number] DOB: 10/16/1950  Jasmine Rollins is a 70 y.o. year old female who is a primary care patient of Tower, Audrie Gallus, MD. I reached out to Jasmine Rollins by phone today in response to a referral sent by Ms. Bellamarie E Wildeman's PCP, Tower, Audrie Gallus, MD.   Jasmine Rollins was given information about Chronic Care Management services today including:  1. CCM service includes personalized support from designated clinical staff supervised by her physician, including individualized plan of care and coordination with other care providers 2. 24/7 contact phone numbers for assistance for urgent and routine care needs. 3. Service will only be billed when office clinical staff spend 20 minutes or more in a month to coordinate care. 4. Only one practitioner may furnish and bill the service in a calendar month. 5. The patient may stop CCM services at any time (effective at the end of the month) by phone call to the office staff.   Patient agreed to services and verbal consent obtained.   Follow up plan:   Carmell [place] [employer] Scheduler

## 2020-06-30 ENCOUNTER — Telehealth: Payer: Self-pay

## 2020-06-30 NOTE — Chronic Care Management (AMB) (Addendum)
Chronic Care Management Pharmacy Assistant   Name: Jasmine Rollins  MRN: [id number] DOB: May 29, 1950  Jasmine Rollins is an 70 y.o. year old female who presents for her initial CCM visit with the clinical pharmacist.  Reason for Encounter: Initial Questions   Conditions to be addressed/monitored: HTN and HLD  Recent office visits:  01/14/2020  Dr.Marne Tower PCP  No medication changes  Recent consult visits:  01/21/2020   [facility]   No data available 01/13/2020 [facility]   No data available 01/08/2020 [facility]  Physical Therapy   No data available  Hospital visits:  None in previous 6 months  Medications: Outpatient Encounter Medications as of 06/30/2020  Medication Sig   albuterol (PROVENTIL HFA) 108 (90 Base) MCG/ACT inhaler INHALE 2 PUFFS EVERY 4 HOURS AS NEEDED   amLODipine (NORVASC) 5 MG tablet Take 1 tablet (5 mg total) by mouth daily.   BENICAR HCT 40-12.5 MG tablet TAKE 1 TABLET DAILY   Calcium Carb-Cholecalciferol (CALCIUM 600+D3) 600-800 MG-UNIT TABS Take by mouth.   fluticasone (FLONASE) 50 MCG/ACT nasal spray Place 2 sprays into both nostrils daily.   fluticasone (FLOVENT HFA) 44 MCG/ACT inhaler USE 2 INHALATIONS TWICE A DAY (RINSE MOUTH AFTER USE)   glucose blood (FREESTYLE LITE) test strip To check glucose daily as needed for early diabetes 2   latanoprost (XALATAN) 0.005 % ophthalmic solution Place 1 drop into both eyes at bedtime.    meloxicam (MOBIC) 15 MG tablet Take 1 tablet (15 mg total) by mouth daily as needed for pain. With food   montelukast (SINGULAIR) 10 MG tablet Take 1 tablet (10 mg total) by mouth at bedtime.   No facility-administered encounter medications on file as of 06/30/2020.    Lab Results  Component Value Date/Time   HGBA1C 6.2 02/05/2019 08:16 AM   HGBA1C 6.7 (H) 11/06/2018 08:23 AM     BP Readings from Last 3 Encounters:  01/14/20 135/85  12/10/19 (!) 148/80  11/22/19 (!) 140/92     Have you seen any other providers since your last visit with PCP? Yes the patient reports she went to a few physial therapy visits for her knee  Any changes in your medications or health? No  Any side effects from any medications? No  Do you have an symptoms or problems not managed by your medications? No  Any concerns about your health right now? Yes The patient stated that she has a cousin in hospital with hypertension medication issues so she inquires about having some repeat bloodwork to make sure her kidneys are functioning well  Has your provider asked that you check blood pressure, blood sugar, or follow special diet at home? Yes the patient reports she does check her BP regularly and she does not add fats with her meals, she will limit her salt intake daily  Do you get any type of exercise on a regular basis? Yes The patient reports her knee is much better after physical therapy and she is able to ride her stationary bike daily  Can you think of a goal you would like to reach for your health? No  Do you have any problems getting your medications? No the patient reports she gets 90DS with [facility] and has no complaints  Is there anything that you would like to discuss during the appointment? No  Jasmine Rollins was reminded to have all medications, supplements and any blood glucose and blood pressure readings  available for review with Phil Dopp, Pharm. D, at her telephone visit on 07/07/2020 at 2:00pm  Star Rating Drugs:  Medication:   Last Fill: Day Supply Olmesartan 40-12.5mg  05/06/2020 90   Follow-Up:  Pharmacist Review  Phil Dopp, CPP notified  Burt Knack [facility] Clinical Pharmacy Assistant [phone]  I have reviewed the care management and care coordination activities outlined in this encounter and I am certifying that I agree with the content of this note. No further action required.  Phil Dopp, PharmD Clinical Pharmacist [facility] [phone]

## 2020-07-07 ENCOUNTER — Ambulatory Visit (INDEPENDENT_AMBULATORY_CARE_PROVIDER_SITE_OTHER): Payer: Medicare Other

## 2020-07-07 ENCOUNTER — Other Ambulatory Visit: Payer: Self-pay

## 2020-07-07 DIAGNOSIS — E7800 Pure hypercholesterolemia, unspecified: Secondary | ICD-10-CM

## 2020-07-07 DIAGNOSIS — J45991 Cough variant asthma: Secondary | ICD-10-CM | POA: Diagnosis not present

## 2020-07-07 DIAGNOSIS — I10 Essential (primary) hypertension: Secondary | ICD-10-CM

## 2020-07-07 NOTE — Progress Notes (Addendum)
Chronic Care Management Pharmacy Note  07/07/20 Name:  [name] MRN:  [id number] DOB:  08/03/1950  Subjective: Jasmine Rollins is an 70 y.o. year old female who is a primary patient of Tower, Wynelle Fanny, MD.  The CCM team was consulted for assistance with disease management and care coordination needs.    Engaged with patient by telephone for initial visit in response to provider referral for pharmacy case management and/or care coordination services. She reports the spot on her breast remains itchy, reports it has grown in size. She has applied cortisone but only helps for a couple hours. Recommend Schedule PCP visit to assess.   Consent to Services:  The patient was given the following information about Chronic Care Management services today, agreed to services, and gave verbal consent: 1. CCM service includes personalized support from designated clinical staff supervised by the primary care provider, including individualized plan of care and coordination with other care providers 2. 24/7 contact phone numbers for assistance for urgent and routine care needs. 3. Service will only be billed when office clinical staff spend 20 minutes or more in a month to coordinate care. 4. Only one practitioner may furnish and bill the service in a calendar month. 5.The patient may stop CCM services at any time (effective at the end of the month) by phone call to the office staff. 6. The patient will be responsible for cost sharing (co-pay) of up to 20% of the service fee (after annual deductible is met). Patient agreed to services and consent obtained.  Patient Care Team: Tower, Wynelle Fanny, MD as PCP - Lillia Pauls, MD as Consulting Physician (Ophthalmology) Debbora Dus, [facility] as Pharmacist (Pharmacist)  Recent office visits:  01/14/2020 Dr. Loura Pardon, PCP -  No medication changes  Recent consult visits:  01/21/2020   Royden Purl  Physical Therapy   01/13/2020 Royden Purl  Physical Therapy     01/08/2020 [facility] visits: None in previous 6 months  Objective:  Lab Results  Component Value Date   CREATININE 0.67 11/06/2018   BUN 10 11/06/2018   GFR 105.51 11/06/2018   GFRNONAA 91 11/27/2007   GFRAA 111 11/27/2007   NA 142 11/06/2018   K 4.7 11/06/2018   CALCIUM 10.4 11/06/2018   CO2 32 11/06/2018   GLUCOSE 112 (H) 11/06/2018    Lab Results  Component Value Date/Time   HGBA1C 6.2 02/05/2019 08:16 AM   HGBA1C 6.7 (H) 11/06/2018 08:23 AM   GFR 105.51 11/06/2018 08:23 AM   GFR 108.73 10/25/2017 08:36 AM    Lab Results  Component Value Date   CHOL 213 (H) 02/05/2019   HDL 73.10 02/05/2019   LDLCALC 126 (H) 02/05/2019   LDLDIRECT 106.4 02/26/2013   TRIG 70.0 02/05/2019   CHOLHDL 3 02/05/2019    Hepatic Function Latest Ref Rng & Units 11/06/2018 10/25/2017 04/21/2017  Total Protein 6.0 - 8.3 g/dL 7.5 7.9 7.9  Albumin 3.5 - 5.2 g/dL 4.3 4.2 4.5  AST 0 - 37 U/L 20 24 18   ALT 0 - 35 U/L 16 33 23  Alk Phosphatase 39 - 117 U/L 84 122(H) 91  Total Bilirubin 0.2 - 1.2 mg/dL 0.8 0.7 0.9  Bilirubin, Direct 0.0 - 0.3 mg/dL - - -    Lab Results  Component Value Date/Time   TSH 4.50 11/06/2018 08:23 AM   TSH 4.40 10/25/2017 08:36 AM   FREET4 0.75 01/25/2017 10:35 AM   FREET4  0.65 12/14/2015 10:13 AM    CBC Latest Ref Rng & Units 11/06/2018 10/25/2017 04/21/2017  WBC 4.0 - 10.5 K/uL 3.8(L) 4.2 2.8(L)  Hemoglobin 12.0 - 15.0 g/dL 14.1 13.6 14.6  Hematocrit 36.0 - 46.0 % 42.4 40.2 43.2  Platelets 150.0 - 400.0 K/uL 277.0 295.0 255.0    Lab Results  Component Value Date/Time   VD25OH 54.51 11/06/2018 08:23 AM   VD25OH 38.59 10/25/2017 08:36 AM    Clinical ASCVD: No  The 10-year ASCVD risk score Mikey Bussing DC Jr., et al., 2013) is: 14.3%   Values used to calculate the score:     Age: 34 years     Sex: Female     Is Non-Hispanic African American: Yes     Diabetic: No     Tobacco smoker: No     Systolic Blood Pressure: 741 mmHg      Is BP treated: Yes     HDL Cholesterol: 73.1 mg/dL     Total Cholesterol: 213 mg/dL    Depression screen [facility] 2/9 01/14/2020 11/06/2018 10/25/2017  Decreased Interest 0 0 0  Down, Depressed, Hopeless 0 0 0  PHQ - 2 Score 0 0 0  Altered sleeping 1 0 0  Tired, decreased energy 0 0 0  Change in appetite 0 0 0  Feeling bad or failure about yourself  0 0 0  Trouble concentrating 0 0 0  Moving slowly or fidgety/restless 0 0 0  Suicidal thoughts 0 0 0  PHQ-9 Score 1 0 0  Difficult doing work/chores Not difficult at all Not difficult at all Not difficult at all    Social History   Tobacco Use  Smoking Status Never Smoker  Smokeless Tobacco Never Used  Tobacco Comment   Remote 2nd hand exposure   BP Readings from Last 3 Encounters:  01/14/20 135/85  12/10/19 (!) 148/80  11/22/19 (!) 140/92   Pulse Readings from Last 3 Encounters:  01/14/20 84  12/10/19 90  11/22/19 77   Wt Readings from Last 3 Encounters:  01/14/20 133 lb 4 oz (60.4 kg)  12/10/19 132 lb 6 oz (60 kg)  11/22/19 132 lb 1 oz (59.9 kg)   BMI Readings from Last 3 Encounters:  01/14/20 24.37 kg/m  12/10/19 24.21 kg/m  11/22/19 24.15 kg/m    Assessment/Interventions: Review of patient past medical history, allergies, medications, health status, including review of consultants reports, laboratory and other test data, was performed as part of comprehensive evaluation and provision of chronic care management services.   SDOH:  (Social Determinants of Health) assessments and interventions performed: Yes SDOH Interventions   Flowsheet Row Most Recent Value  SDOH Interventions   Financial Strain Interventions Intervention Not Indicated     SDOH Screenings   Alcohol Screen: Not on file  Depression (PHQ2-9): Low Risk   . PHQ-2 Score: 1  Financial Resource Strain: Low Risk   . Difficulty of Paying Living Expenses: Not very hard  Food Insecurity: Not on file  Housing: Not on file  Physical Activity: Not on file   Social Connections: Not on file  Stress: Not on file  Tobacco Use: Low Risk   . Smoking Tobacco Use: Never Smoker  . Smokeless Tobacco Use: Never Used  Transportation Needs: Not on file    CCM Care Plan  Allergies  Allergen Reactions  . Augmentin [Amoxicillin-Pot Clavulanate]     GI upset   . Sulfonamide Derivatives Rash    Medications Reviewed Today    Reviewed by Debbora Dus,  [place] (Pharmacist) on 07/07/20 at 1430  Med List Status: <None>  Medication Order Taking? Sig Documenting Provider Last Dose Status Informant  albuterol (PROVENTIL HFA) 108 (90 Base) MCG/ACT inhaler [id number] Yes INHALE 2 PUFFS EVERY 4 HOURS AS NEEDED Tower, Wynelle Fanny, MD Taking Active   amLODipine (NORVASC) 5 MG tablet [id number] Yes Take 1 tablet (5 mg total) by mouth daily. Tower, Wynelle Fanny, MD Taking Active   BENICAR HCT 40-12.5 MG tablet [id number] Yes TAKE 1 TABLET DAILY Tower, Wynelle Fanny, MD Taking Active   Calcium Carb-Cholecalciferol 600-800 MG-UNIT TABS [id number] Yes Take by mouth. [provider] Taking Active Self  fluticasone (FLONASE) 50 MCG/ACT nasal spray [id number] Yes Place 2 sprays into both nostrils daily. Tower, Wynelle Fanny, MD Taking Active   fluticasone (FLOVENT HFA) 44 MCG/ACT inhaler [id number] Yes USE 2 INHALATIONS TWICE A DAY (RINSE MOUTH AFTER USE) Tower, Wynelle Fanny, MD Taking Active   glucose blood (FREESTYLE LITE) test strip [id number] Yes To check glucose daily as needed for early diabetes 2 Tower, Wynelle Fanny, MD Taking Active   latanoprost (XALATAN) 0.005 % ophthalmic solution [id number] Yes Place 1 drop into both eyes at bedtime.  [provider] Taking Active Self           Med Note Eulas Post, CRYSTAL D   Wed Oct 21, 2015 12:24 AM)    montelukast (SINGULAIR) 10 MG tablet [id number] Yes Take 1 tablet (10 mg total) by mouth at bedtime. Tower, Wynelle Fanny, MD Taking Active           Patient Active Problem List   Diagnosis Date Noted  . Seborrheic keratoses, inflamed 01/14/2020  .  Right knee pain 11/22/2019  . Dysuria 09/20/2019  . Vaginitis 09/20/2019  . COVID-19 04/08/2019  . Cough 10/09/2018  . Vitamin D deficiency 12/06/2015  . GERD (gastroesophageal reflux disease) 06/09/2014  . Encounter for routine gynecological examination 03/03/2014  . Estrogen deficiency 03/03/2014  . Heartburn 03/01/2013  . Gynecological examination 06/22/2010  . Special screening for malignant neoplasms, colon 06/22/2010  . Glaucoma suspect 06/22/2010  . Routine general medical examination at a health care facility 06/03/2010  . Sleep apnea   . Osteoporosis 05/20/2009  . SLEEP APNEA 10/17/2008  . COUGH VARIANT ASTHMA 07/30/2008  . Leukocytopenia 01/29/2008  . Prediabetes 11/27/2007  . HYPERCHOLESTEROLEMIA 03/21/2007  . Essential hypertension 03/21/2007  . Sinusitis, chronic 03/21/2007  . Allergic rhinitis 03/21/2007  . DIVERTICULOSIS, COLON 03/21/2007    Immunization History  Administered Date(s) Administered  . Fluad Quad(high Dose 65+) 12/10/2019  . Influenza Whole 11/12/2008, 11/05/2009  . Influenza, High Dose Seasonal PF 11/13/2014, 11/03/2017, 10/16/2018  . Influenza,inj,Quad PF,6+ Mos 11/03/2016  . Influenza-Unspecified 11/28/2012, 11/15/2013, 11/03/2016  . PFIZER(Purple Top)SARS-COV-2 Vaccination 03/27/2019, 07/08/2019  . Pneumococcal Conjugate-13 12/09/2015  . Pneumococcal Polysaccharide-23 03/19/2009, 01/25/2017  . Td 05/15/2001  . Tdap 06/22/2010  . Zoster Recombinat (Shingrix) 09/15/2019, 11/14/2019  . Zoster, Live 08/05/2010    Conditions to be addressed/monitored:  Hypertension, Hyperlipidemia and Asthma  Care Plan : [place]  Updates made by Debbora Dus, [facility] since 07/14/2020 12:00 AM    Problem: CHL AMB "PATIENT-SPECIFIC PROBLEM"     Long-Range Goal: Disease Management   Start Date: 07/07/2020  Priority: High  Note:   Current Barriers:  . None identified  Pharmacist Clinical Goal(s):  Marland Kitchen Patient will contact provider office for  questions/concerns as evidenced notation of same in electronic health record through collaboration with PharmD and provider.   Interventions: . 1:1 collaboration with  Tower, Wynelle Fanny, MD regarding development and update of comprehensive plan of care as evidenced by provider attestation and co-signature . Inter-disciplinary care team collaboration (see longitudinal plan of care) . Comprehensive medication review performed; medication list updated in electronic medical record  Hypertension (BP goal <140/90) -Controlled - home BP well controlled -Current treatment:  Amlodipine 5 mg - 1 tablet daily   Benicar HCT 40-12.5 mg - 1 tablet daily  -Medications previously tried: none reported -Current home readings:  07/01/20 - 112/75, 73 07/03/20 -  119/77, 75 07/06/20 - 122/81, 77 -Denies hypotensive/hypertensive symptoms -Educated on BP goals and benefits of medications for prevention of heart attack, stroke and kidney damage; -Counseled to monitor BP at home once monthly, document, and provide log at future appointments -Recommended to continue current medication  Hyperlipidemia: (LDL goal < 100) -Not ideally controlled - LDL 126 -Current treatment: . None -Medications previously tried: none She reports family of history of stroke and MI and is concerned about her cholesterol. She is open to medication for cholesterol lowering if needed. -Current dietary patterns: She has been trying to improve diet - [place] bacon, lean meats since last cholesterol check 12/20 and would like to see updated results. -Educated on Benefits of statin for ASCVD risk reduction; -Recommended  consider statin therapy; Order labs/discuss cholesterol at [facility]. Contact office for scheduling.  Asthma (Goal: control symptoms and prevent exacerbations) -Controlled - per patient report, very stable  -She used to follow with Dr. Annamaria Boots -Current treatment  . Flonase nasal spray - uses as needed . Mucinex - as  needed . Zyrtec 10 mg - 1 tablet every night . Singulair 10 mg - 1 tablet at bedtime  . Albuterol - 2 puffs as needed . Flovent inhaler - takes 2 inhalations twice a day as needed (takes mainly for coughing -  She notices improvement using as needed) -Medications previously tried: none -She denies any shortness of breathing, main symptom is cough -Pulmonary function testing: none per chart -Exacerbations requiring treatment in last 6 months: none -Patient denies consistent use of maintenance inhaler -Frequency of rescue inhaler use: does not recall last use -Recommended to continue current medication  Other  Latanoprost 0.005% - taking daily No longer taking meloxicam  Patient Goals/Self-Care Activities . Patient will:  - schedule office visit with PCP  Follow Up Plan: Telephone follow up appointment with care management team member scheduled for: 6 months     Medication Assistance: None required.  Patient affirms current coverage meets needs.  Patient's preferred pharmacy is:  [facility], [place] [address] [place] [address] Phone: [phone] Fax: [phone]  [id number] - [place], [place] - [place] [place] [address] Phone: [phone] Fax: [phone]  [facility] for acute meds, Mail order for maintenance  Uses pill box? No - She keeps the medications in the original bottles in a box  Pt endorses 100% compliance  We discussed: Current pharmacy is preferred with insurance plan and patient is satisfied with pharmacy services Patient decided to: Continue current medication management strategy  Care Plan and Follow Up Patient Decision:  Patient agrees to Care Plan and Follow-up.   Debbora Dus, PharmD Clinical Pharmacist [facility] [phone]   I have personally  reviewed this encounter including the documentation in this note and have collaborated with the care management  provider regarding care management and care coordination activities to include development and update of the comprehensive care plan. I am certifying that I agree with the content of this note and encounter as supervising physician.   Loura Pardon MD

## 2020-07-10 ENCOUNTER — Encounter: Payer: Self-pay | Admitting: Family Medicine

## 2020-07-14 NOTE — Patient Instructions (Signed)
Jul 14, 2020  Dear Jasmine Rollins,  It was a pleasure meeting you during our initial appointment on Jul 07, 2020. Below is a summary of the goals we discussed and components of chronic care management. Please contact me anytime with questions or concerns.   Visit Information  Patient Care Plan: CCM Pharmacy Care Plan    Problem Identified: CHL AMB "PATIENT-SPECIFIC PROBLEM"     Long-Range Goal: Disease Management   Start Date: 07/07/2020  Priority: High  Note:   Current Barriers:  . None identified  Pharmacist Clinical Goal(s):  Marland Kitchen. Patient will contact provider office for questions/concerns as evidenced notation of same in electronic health record through collaboration with PharmD and provider.   Interventions: . 1:1 collaboration with Tower, Audrie GallusMarne A, MD regarding development and update of comprehensive plan of care as evidenced by provider attestation and co-signature . Inter-disciplinary care team collaboration (see longitudinal plan of care) . Comprehensive medication review performed; medication list updated in electronic medical record  Hypertension (BP goal <140/90) -Controlled - home BP well controlled -Current treatment:  Amlodipine 5 mg - 1 tablet daily   Benicar HCT 40-12.5 mg - 1 tablet daily  -Medications previously tried: none reported -Current home readings:  07/01/20 - 112/75, 73 07/03/20 -  119/77, 75 07/06/20 - 122/81, 77 -Denies hypotensive/hypertensive symptoms -Educated on BP goals and benefits of medications for prevention of heart attack, stroke and kidney damage; -Counseled to monitor BP at home once monthly, document, and provide log at future appointments -Recommended to continue current medication  Hyperlipidemia: (LDL goal < 100) -Not ideally controlled - LDL 126 -Current treatment: . None -Medications previously tried: none She reports family of history of stroke and MI and is concerned about her cholesterol. She is open to medication for  cholesterol lowering if needed. -Current dietary patterns: She has been trying to improve diet - [place] bacon, lean meats since last cholesterol check 12/20 and would like to see updated results. -Educated on Benefits of statin for ASCVD risk reduction; -Recommended  consider statin therapy; Order labs/discuss cholesterol at [facility]. Contact office for scheduling.  Asthma (Goal: control symptoms and prevent exacerbations) -Controlled - per patient report, very stable  -She used to follow with Dr. Maple HudsonYoung -Current treatment  . Flonase nasal spray - uses as needed . Mucinex - as needed . Zyrtec 10 mg - 1 tablet every night . Singulair 10 mg - 1 tablet at bedtime  . Albuterol - 2 puffs as needed . Flovent inhaler - takes 2 inhalations twice a day as needed (takes mainly for coughing -  She notices improvement using as needed) -Medications previously tried: none -She denies any shortness of breathing, main symptom is cough -Pulmonary function testing: none per chart -Exacerbations requiring treatment in last 6 months: none -Patient denies consistent use of maintenance inhaler -Frequency of rescue inhaler use: does not recall last use -Recommended to continue current medication  Other  Latanoprost 0.005% - taking daily No longer taking meloxicam  Patient Goals/Self-Care Activities . Patient will:  - schedule office visit with PCP  Follow Up Plan: Telephone follow up appointment with care management team member scheduled for: 6 months     [name] was given information about Chronic Care Management services today including:  1. CCM service includes personalized support from designated clinical staff supervised by her physician, including individualized plan of care and coordination with other care providers 2. 24/7 contact phone numbers for assistance for urgent and routine care needs. 3. Standard insurance,  coinsurance, copays and deductibles apply for chronic care management only during  months in which we provide at least 20 minutes of these services. Most insurances cover these services at 100%, however patients may be responsible for any copay, coinsurance and/or deductible if applicable. This service may help you avoid the need for more expensive face-to-face services. 4. Only one practitioner may furnish and bill the service in a calendar month. 5. The patient may stop CCM services at any time (effective at the end of the month) by phone call to the office staff.  Patient agreed to services and verbal consent obtained.   The patient verbalized understanding of instructions, educational materials, and care plan provided today and agreed to receive a mailed copy of patient instructions, educational materials, and care plan.   Phil Dopp, PharmD Clinical Pharmacist [facility] [phone]   Basics of Medicine Management Taking your medicines correctly is an important part of managing or preventing medical problems. Make sure you know what disease or condition your medicine is treating, and how and when to take it. If you do not take your medicine correctly, it may not work well and may cause unpleasant side effects, including serious health problems. What should I do when I am taking medicines?  Read all the labels and inserts that come with your medicines. Review the information often.  Talk with your pharmacist if you get a refill and notice a change in the size, color, or shape of your medicines.  Know the potential side effects for each medicine that you take.  Try to get all your medicines from the same pharmacy. The pharmacist will have all your information and will understand how your medicines will affect each other (interact).  Tell your health care provider about all your medicines, including over-the-counter medicines, vitamins, and herbal or dietary supplements. He or she will make sure that nothing will interact with any of your  prescribed medicines.   How can I take my medicines safely?  Take medicines only as told by your health care provider. ? Do not take more of your medicine than instructed. ? Do not take anyone else's medicines. ? Do not share your medicines with others. ? Do not stop taking your medicines unless your health care provider tells you to do so. ? You may need to avoid alcohol or certain foods or liquids when taking certain medicines. Follow your health care provider's instructions.  Do not split, mash, or chew your medicines unless your health care provider tells you to do so. Tell your health care provider if you have trouble swallowing your medicines.  For liquid medicine, use the dosing container that was provided. How should I organize my medicines? Know your medicines  Know what each of your medicines looks like. This includes size, color, and shape. Tell your health care provider if you are having trouble recognizing all the medicines that you are taking.  If you cannot tell your medicines apart because they look similar, keep them in original bottles.  If you cannot read the labels on the bottles, tell your pharmacist to put your medicines in containers with large print.  Review your medicines and your schedule with family members, a friend, or a caregiver. Use a pill organizer  Use a tool to organize your medicine schedule. Tools include a weekly pillbox, a written chart, a notebook, or a calendar.  Your tool should help you remember the following things about each medicine: ? The name of  the medicine. ? The amount (dose) to take. ? The schedule. This is the day and time the medicine should be taken. ? The appearance. This includes color, shape, size, and stamp. ? How to take your medicines. This includes instructions to take them with food, without food, with fluids, or with other medicines.  Create reminders for taking your medicines. Use sticky notes, or alarms on your watch,  mobile device, or phone calendar.  You may choose to use a more advanced management system. These systems have storage, alarms, and visual and audio prompts.  Some medicines can be taken on an "as-needed" basis. These include medicines for nausea or pain. If you take an as-needed medicine, write down the name and dose, as well as the date and time that you took it.   How should I plan for travel?  Take your pillbox, medicines, and organization system with you when traveling.  Have your medicines refilled before you travel. This will ensure that you do not run out of your medicines while you are away from home.  Always carry an updated list of your medicines with you. If there is an emergency, a first responder can quickly see what medicines you are taking.  Do not pack your medicines in checked luggage in case your luggage is lost or delayed.  If any of your medicines is considered a controlled substance, make sure you bring a letter from your health care provider with you. How should I store and discard my medicines? For safe storage:  Store medicines in a cool, dry area away from light, or as directed by your health care provider. Do not store medicines in the bathroom. Heat and humidity will affect them.  Do not store your medicines with other chemicals, or with medicines for pets or other household members.  Keep medicines away from children and pets. Do not leave them on counters or bedside tables. Store them in high cabinets or on high shelves. For safe disposal:  Check expiration dates regularly. Do not take expired medicines. Discard medicines that are older than the expiration date.  Learn a safe way to dispose of your medicines. You may: ? Use a local government, hospital, or pharmacy medicine-take-back program. ? Mix the medicines with inedible substances, put them in a sealed bag or empty container, and throw them in the trash. What should I remember?  Tell your health  care provider if you: ? Experience side effects. ? Have new symptoms. ? Have other concerns about taking your medicines.  Review your medicines regularly with your health care provider. Other medicines, diet, medical conditions, weight changes, and daily habits can all affect how medicines work. Ask if you need to continue taking each medicine, and discuss how well each one is working.  Refill your medicines early to avoid running out of them.  In case of an accidental overdose, call your local Poison Control Center at 219-405-3594 or visit your local emergency department immediately. This is important. Summary  Taking your medicines correctly is an important part of managing or preventing medical problems.  You need to make sure that you understand what you are taking a medicine for, as well as how and when you need to take it.  Know your medicines and use a pill organizer to help you take your medicines correctly.  In case of an accidental overdose, call your local Poison Control Center at [phone] or visit your local emergency department immediately. This is important. This information is not intended  to replace advice given to you by your health care provider. Make sure you discuss any questions you have with your health care provider. Document Revised: 01/26/2017 Document Reviewed: 01/26/2017 Elsevier Patient Education  2021 [employer].

## 2020-07-26 ENCOUNTER — Telehealth: Payer: Self-pay | Admitting: Family Medicine

## 2020-07-26 DIAGNOSIS — E7800 Pure hypercholesterolemia, unspecified: Secondary | ICD-10-CM

## 2020-07-26 DIAGNOSIS — Z0000 Encounter for general adult medical examination without abnormal findings: Secondary | ICD-10-CM

## 2020-07-26 DIAGNOSIS — I10 Essential (primary) hypertension: Secondary | ICD-10-CM

## 2020-07-26 DIAGNOSIS — E559 Vitamin D deficiency, unspecified: Secondary | ICD-10-CM

## 2020-07-26 DIAGNOSIS — M810 Age-related osteoporosis without current pathological fracture: Secondary | ICD-10-CM

## 2020-07-26 DIAGNOSIS — R7303 Prediabetes: Secondary | ICD-10-CM

## 2020-07-26 NOTE — Telephone Encounter (Signed)
-----   Message from Aquilla Solian, RT sent at 07/14/2020 12:09 PM EDT ----- Regarding: Lab Orders for Monday 6.13.2022 Please place lab orders for Monday 6.13.2022, office visit for physical on Wednesday 6.22.2022 Thank you, Jones Bales RT(R)

## 2020-07-27 ENCOUNTER — Other Ambulatory Visit (INDEPENDENT_AMBULATORY_CARE_PROVIDER_SITE_OTHER): Payer: Medicare Other

## 2020-07-27 ENCOUNTER — Other Ambulatory Visit: Payer: Self-pay

## 2020-07-27 ENCOUNTER — Ambulatory Visit (INDEPENDENT_AMBULATORY_CARE_PROVIDER_SITE_OTHER): Payer: Medicare Other

## 2020-07-27 DIAGNOSIS — R7303 Prediabetes: Secondary | ICD-10-CM | POA: Diagnosis not present

## 2020-07-27 DIAGNOSIS — I10 Essential (primary) hypertension: Secondary | ICD-10-CM

## 2020-07-27 DIAGNOSIS — Z0000 Encounter for general adult medical examination without abnormal findings: Secondary | ICD-10-CM

## 2020-07-27 DIAGNOSIS — E559 Vitamin D deficiency, unspecified: Secondary | ICD-10-CM

## 2020-07-27 DIAGNOSIS — E7800 Pure hypercholesterolemia, unspecified: Secondary | ICD-10-CM

## 2020-07-27 LAB — COMPREHENSIVE METABOLIC PANEL
ALT: 17 U/L (ref 0–35)
AST: 17 U/L (ref 0–37)
Albumin: 4.3 g/dL (ref 3.5–5.2)
Alkaline Phosphatase: 107 U/L (ref 39–117)
BUN: 10 mg/dL (ref 6–23)
CO2: 32 mEq/L (ref 19–32)
Calcium: 9.7 mg/dL (ref 8.4–10.5)
Chloride: 105 mEq/L (ref 96–112)
Creatinine, Ser: 0.72 mg/dL (ref 0.40–1.20)
GFR: 84.37 mL/min (ref >60.00)
Glucose, Bld: 115 mg/dL — ABNORMAL HIGH (ref 70–99)
Potassium: 4.5 mEq/L (ref 3.5–5.1)
Sodium: 143 mEq/L (ref 135–145)
Total Bilirubin: 0.7 mg/dL (ref 0.2–1.2)
Total Protein: 7.3 g/dL (ref 6.0–8.3)

## 2020-07-27 LAB — CBC WITH DIFFERENTIAL/PLATELET
Basophils Absolute: 0 10*3/uL (ref 0.0–0.1)
Basophils Relative: 0.7 % (ref 0.0–3.0)
Eosinophils Absolute: 0.1 10*3/uL (ref 0.0–0.7)
Eosinophils Relative: 3.5 % (ref 0.0–5.0)
HCT: 41.4 % (ref 36.0–46.0)
Hemoglobin: 13.6 g/dL (ref 12.0–15.0)
Lymphocytes Relative: 49.3 % — ABNORMAL HIGH (ref 12.0–46.0)
Lymphs Abs: 1.9 10*3/uL (ref 0.7–4.0)
MCHC: 32.8 g/dL (ref 30.0–36.0)
MCV: 94.5 fl (ref 78.0–100.0)
Monocytes Absolute: 0.3 10*3/uL (ref 0.1–1.0)
Monocytes Relative: 8.5 % (ref 3.0–12.0)
Neutro Abs: 1.5 10*3/uL (ref 1.4–7.7)
Neutrophils Relative %: 38 % — ABNORMAL LOW (ref 43.0–77.0)
Platelets: 263 10*3/uL (ref 150.0–400.0)
RBC: 4.38 Mil/uL (ref 3.87–5.11)
RDW: 13 % (ref 11.5–15.5)
WBC: 3.9 10*3/uL — ABNORMAL LOW (ref 4.0–10.5)

## 2020-07-27 LAB — LIPID PANEL
Cholesterol: 186 mg/dL (ref 0–200)
HDL: 74.4 mg/dL (ref >39.00)
LDL Cholesterol: 97 mg/dL (ref 0–99)
NonHDL: 111.13
Total CHOL/HDL Ratio: 2
Triglycerides: 73 mg/dL (ref 0.0–149.0)
VLDL: 14.6 mg/dL (ref 0.0–40.0)

## 2020-07-27 LAB — HEMOGLOBIN A1C: Hgb A1c MFr Bld: 6.7 % — ABNORMAL HIGH (ref 4.6–6.5)

## 2020-07-27 LAB — TSH: TSH: 4.59 u[IU]/mL — ABNORMAL HIGH (ref 0.35–4.50)

## 2020-07-27 LAB — VITAMIN D 25 HYDROXY (VIT D DEFICIENCY, FRACTURES): VITD: 59.66 ng/mL (ref 30.00–100.00)

## 2020-07-27 NOTE — Progress Notes (Signed)
PCP notes:  Health Maintenance: Tdap- insurance Covid second booster- due Dexa- due     Abnormal Screenings: none   Patient concerns: Itchy spot on breast    Nurse concerns: none   Next PCP appt.: 08/05/2020 @ 10 am

## 2020-07-27 NOTE — Progress Notes (Signed)
Subjective:   Jasmine Rollins is a 70 y.o. female who presents for Medicare Annual (Subsequent) preventive examination.  Review of Systems: N/A     I connected with the patient today by telephone and verified that I am speaking with the correct person using two identifiers. Location patient: home Location nurse: work Persons participating in the telephone visit: patient, nurse.   I discussed the limitations, risks, security and privacy concerns of performing an evaluation and management service by telephone and the availability of in person appointments. I also discussed with the patient that there may be a patient responsible charge related to this service. The patient expressed understanding and verbally consented to this telephonic visit.        Cardiac Risk Factors include: advanced age (>44men, >54 women);hypertension;Other (see comment), Risk factor comments: hypercholesterolemia     Objective:    Today's Vitals   There is no height or weight on file to calculate BMI.  Advanced Directives 07/27/2020 11/06/2018 10/25/2017 12/09/2015 10/20/2015 06/11/2015  Does Patient Have a Medical Advance Directive? No No No No No No  Does patient want to make changes to medical advance directive? No - Patient declined - - - - -  Would patient like information on creating a medical advance directive? - Yes (MAU/Ambulatory/Procedural Areas - Information given) Yes (MAU/Ambulatory/Procedural Areas - Information given) Yes - Educational materials given - -    Current Medications (verified) Outpatient Encounter Medications as of 07/27/2020  Medication Sig   albuterol (PROVENTIL HFA) 108 (90 Base) MCG/ACT inhaler INHALE 2 PUFFS EVERY 4 HOURS AS NEEDED   amLODipine (NORVASC) 5 MG tablet Take 1 tablet (5 mg total) by mouth daily.   BENICAR HCT 40-12.5 MG tablet TAKE 1 TABLET DAILY   Calcium Carb-Cholecalciferol 600-800 MG-UNIT TABS Take by mouth.   fluticasone (FLONASE) 50 MCG/ACT nasal spray Place 2  sprays into both nostrils daily.   fluticasone (FLOVENT HFA) 44 MCG/ACT inhaler USE 2 INHALATIONS TWICE A DAY (RINSE MOUTH AFTER USE)   glucose blood (FREESTYLE LITE) test strip To check glucose daily as needed for early diabetes 2   latanoprost (XALATAN) 0.005 % ophthalmic solution Place 1 drop into both eyes at bedtime.    montelukast (SINGULAIR) 10 MG tablet Take 1 tablet (10 mg total) by mouth at bedtime.   No facility-administered encounter medications on file as of 07/27/2020.    Allergies (verified) Augmentin [amoxicillin-pot clavulanate] and Sulfonamide derivatives   History: Past Medical History:  Diagnosis Date   Allergic rhinitis    Arthritis    hip   Borderline high cholesterol    Cataract    Cough variant asthma    Diverticulosis of colon    GERD (gastroesophageal reflux disease)    HTN (hypertension)    Hyperglycemia    borderline DM   OP (osteoporosis)    Seasonal allergies    Sleep apnea    no cpap   Past Surgical History:  Procedure Laterality Date   COLONOSCOPY     COLPOSCOPY     ESOPHAGOGASTRODUODENOSCOPY  10/01   Negative   ROTATOR CUFF REPAIR     right   TUBAL LIGATION     Family History  Problem Relation Age of Onset   Hypertension Father    Diabetes Father    Diabetes Brother    Diabetes Brother    Cancer Brother        prostate   Cancer Brother        prostate   Cancer Brother  prostate   Cancer Brother        prostate   Cancer Brother        prostate   Cancer Brother        prostate   Breast cancer Maternal Aunt    Colon cancer Neg Hx    Social History   Socioeconomic History   Marital status: Married    Spouse name: Not on file   Number of children: 1   Years of education: Not on file   Highest education level: Not on file  Occupational History   Occupation: Customer Service  Tobacco Use   Smoking status: Never   Smokeless tobacco: Never   Tobacco comments:    Remote 2nd hand exposure  Vaping Use   Vaping  Use: Never used  Substance and Sexual Activity   Alcohol use: No    Alcohol/week: 0.0 standard drinks   Drug use: No   Sexual activity: Yes  Other Topics Concern   Not on file  Social History Narrative   Not on file   Social Determinants of Health   Financial Resource Strain: Low Risk    Difficulty of Paying Living Expenses: Not hard at all  Food Insecurity: No Food Insecurity   Worried About Programme researcher, broadcasting/film/video in the Last Year: Never true   Ran Out of Food in the Last Year: Never true  Transportation Needs: No Transportation Needs   Lack of Transportation (Medical): No   Lack of Transportation (Non-Medical): No  Physical Activity: Insufficiently Active   Days of Exercise per Week: 3 days   Minutes of Exercise per Session: 30 min  Stress: No Stress Concern Present   Feeling of Stress : Not at all  Social Connections: Not on file    Tobacco Counseling Counseling given: Not Answered Tobacco comments: Remote 2nd hand exposure   Clinical Intake:  Pre-visit preparation completed: Yes  Pain : No/denies pain     Nutritional Risks: None Diabetes: No  How often do you need to have someone help you when you read instructions, pamphlets, or other written materials from your doctor or pharmacy?: 1 - Never  Diabetic: No Nutrition Risk Assessment:  Has the patient had any N/V/D within the last 2 months?  No  Does the patient have any non-healing wounds?  No  Has the patient had any unintentional weight loss or weight gain?  No   Diabetes:  Is the patient diabetic?  No  If diabetic, was a CBG obtained today?   N/A Did the patient bring in their glucometer from home?   N/A How often do you monitor your CBG's? N/A.   Financial Strains and Diabetes Management:  Are you having any financial strains with the device, your supplies or your medication?  N/A .  Does the patient want to be seen by Chronic Care Management for management of their diabetes?   N/A Would the  patient like to be referred to a Nutritionist or for Diabetic Management?   N/A   Interpreter Needed?: No  Information entered by :: CJohnson, LPN   Activities of Daily Living In your present state of health, do you have any difficulty performing the following activities: 07/27/2020  Hearing? N  Vision? N  Difficulty concentrating or making decisions? N  Walking or climbing stairs? N  Dressing or bathing? N  Doing errands, shopping? N  Preparing Food and eating ? N  Using the Toilet? N  In the past six months, have you accidently  leaked urine? N  Do you have problems with loss of bowel control? N  Managing your Medications? N  Managing your Finances? N  Housekeeping or managing your Housekeeping? N  Some recent data might be hidden    Patient Care Team: Tower, Audrie Gallus, MD as PCP - General Mateo Flow, MD as Consulting Physician (Ophthalmology) Phil Dopp, [facility] as Pharmacist (Pharmacist)  Indicate any recent Medical Services you may have received from other than [facility] providers in the past year (date may be approximate).     Assessment:   This is a routine wellness examination for Shiniqua.  Hearing/Vision screen Vision Screening - Comments:: Patient gets annual eye exams   Dietary issues and exercise activities discussed: Current Exercise Habits: Home exercise routine, Type of exercise: Other - see comments (stationary bike), Time (Minutes): 30, Frequency (Times/Week): 3, Weekly Exercise (Minutes/Week): 90, Intensity: Moderate, Exercise limited by: None identified   Goals Addressed             This Visit's Progress    Patient Stated       07/27/2020, I will continue to ride my stationary bike 3 days a week for 30 minutes.         Depression Screen PHQ 2/9 Scores 07/27/2020 01/14/2020 11/06/2018 10/25/2017 01/25/2017 12/09/2015  PHQ - 2 Score 0 0 0 0 0 0  PHQ- 9 Score 0 1 0 0 - -    Fall Risk Fall Risk  07/27/2020 11/06/2018 10/25/2017 01/25/2017 12/09/2015   Falls in the past year? 0 0 No Yes No  Number falls in past yr: 0 0 - 1 -  Injury with Fall? 0 - - Yes -  Risk for fall due to : Medication side effect Medication side effect - - -  Follow up Falls evaluation completed;Falls prevention discussed Falls evaluation completed;Falls prevention discussed - Falls evaluation completed -    FALL RISK PREVENTION PERTAINING TO THE HOME:  Any stairs in or around the home? Yes  If so, are there any without handrails? No  Home free of loose throw rugs in walkways, pet beds, electrical cords, etc? Yes  Adequate lighting in your home to reduce risk of falls? Yes   ASSISTIVE DEVICES UTILIZED TO PREVENT FALLS:  Life alert? No  Use of a cane, walker or w/c? No  Grab bars in the bathroom? No  Shower chair or bench in shower? No  Elevated toilet seat or a handicapped toilet? No   TIMED UP AND GO:  Was the test performed?  N/A telephone visit  .   Cognitive Function: MMSE - Mini Mental State Exam 07/27/2020 11/06/2018 10/25/2017 12/09/2015  Orientation to time Orientation to Place Registration Attention/ Calculation 5 5 0 0  Recall Language- name 2 objects - - 0 0  Language- repeat Language- follow 3 step command - - 3 3  Language- read & follow direction - - 0 0  Write a sentence - - 0 0  Copy design - - 0 0  Total score - - 20 20  Mini Cog  Mini-Cog screen was completed. Maximum score is 22. A value of 0 denotes this part of the MMSE was not completed or the patient failed this part of the Mini-Cog screening.       Immunizations Immunization History  Administered Date(s) Administered   Fluad Quad(high Dose 65+) 12/10/2019  Influenza Whole 11/12/2008, 11/05/2009   Influenza, High Dose Seasonal PF 11/13/2014, 11/03/2017, 10/16/2018   Influenza,inj,Quad PF,6+ Mos 11/03/2016   Influenza-Unspecified 11/28/2012, 11/15/2013, 11/03/2016   PFIZER(Purple Top)SARS-COV-2 Vaccination 03/27/2019,  07/08/2019, 01/30/2020   Pneumococcal Conjugate-13 12/09/2015   Pneumococcal Polysaccharide-23 03/19/2009, 01/25/2017   Td 05/15/2001   Tdap 06/22/2010   Zoster Recombinat (Shingrix) 09/15/2019, 11/14/2019   Zoster, Live 08/05/2010    TDAP status: Due, Education has been provided regarding the importance of this vaccine. Advised may receive this vaccine at local pharmacy or Health Dept. Aware to provide a copy of the vaccination record if obtained from local pharmacy or Health Dept. Verbalized acceptance and understanding.  Flu Vaccine status: Up to date  Pneumococcal vaccine status: Up to date  Covid-19 vaccine status: Completed 3 vaccines, will discuss second booster with provider   Qualifies for Shingles Vaccine? Yes   Zostavax completed Yes   Shingrix Completed?: Yes  Screening Tests Health Maintenance  Topic Date Due   COVID-19 Vaccine (4 - Booster for Pfizer series) 04/29/2020   TETANUS/TDAP  07/27/2025 (Originally 06/21/2020)   INFLUENZA VACCINE  09/14/2020   MAMMOGRAM  01/23/2022   COLONOSCOPY (Pts 45-74yrs Insurance coverage will need to be confirmed)  06/10/2025   DEXA SCAN  Completed   Hepatitis C Screening  Completed   PNA vac Low Risk Adult  Completed   Zoster Vaccines- Shingrix  Completed   HPV VACCINES  Aged Out    Health Maintenance  Health Maintenance Due  Topic Date Due   COVID-19 Vaccine (4 - Booster for Pfizer series) 04/29/2020    Colorectal cancer screening: Type of screening: Colonoscopy. Completed 06/11/2015. Repeat every 10 years  Mammogram status: Completed 01/24/2020. Repeat every year  Bone Density status: due, will discuss with provider   Lung Cancer Screening: (Low Dose CT Chest recommended if Age 84-80 years, 30 pack-year currently smoking OR have quit w/in 15 years.) does not qualify  Additional Screening:  Hepatitis C Screening: does qualify; Completed 06/08/2015  Vision Screening: Recommended annual ophthalmology exams for early  detection of glaucoma and other disorders of the eye. Is the patient up to date with their annual eye exam?  Yes  Who is the provider or what is the name of the office in which the patient attends annual eye exams? Dr. Alben Spittle, [facility] If pt is not established with a provider, would they like to be referred to a provider to establish care? No .   Dental Screening: Recommended annual dental exams for proper oral hygiene  Community Resource Referral / Chronic Care Management: CRR required this visit?  No   CCM required this visit?  No      Plan:     I have personally reviewed and noted the following in the patient's chart:   Medical and social history Use of alcohol, tobacco or illicit drugs  Current medications and supplements including opioid prescriptions.  Functional ability and status Nutritional status Physical activity Advanced directives List of other physicians Hospitalizations, surgeries, and ER visits in previous 12 months Vitals Screenings to include cognitive, depression, and falls Referrals and appointments  In addition, I have reviewed and discussed with patient certain preventive protocols, quality metrics, and best practice recommendations. A written personalized care plan for preventive services as well as general preventive health recommendations were provided to patient.   Due to this being a telephonic visit, the after visit summary with patients personalized plan was offered to patient via office or my-chart. Patient preferred to pick up  at office at next visit or via mychart.   Janalyn ShyJohnson, Kordelia Severin, LPN   [id number]/61/09606/13/2022

## 2020-07-27 NOTE — Patient Instructions (Signed)
[name] , Thank you for taking time to come for your Medicare Wellness Visit. I appreciate your ongoing commitment to your health goals. Please review the following plan we discussed and let me know if I can assist you in the future.   Screening recommendations/referrals: Colonoscopy: Up to date, completed 06/11/2015, due 05/2025 Mammogram: Up to date, completed 01/24/2020, due 01/2021 Bone Density: due, will discuss with provider at physical  Recommended yearly ophthalmology/optometry visit for glaucoma screening and checkup Recommended yearly dental visit for hygiene and checkup  Vaccinations: Influenza vaccine: Up to date, completed 12/10/2019, due 09/2020 Pneumococcal vaccine: Completed series Tdap vaccine: decline-insurance Shingles vaccine: Completed series   Covid-19:Completed 3 vaccines, will discuss second booster with provider   Advanced directives: Advance directive discussed with you today. Even though you declined this today please call our office should you change your mind and we can give you the proper paperwork for you to fill out.  Conditions/risks identified: hypertension, hypercholesterolemia   Next appointment: Follow up in one year for your annual wellness visit    Preventive Care 71 Years and Older, Female Preventive care refers to lifestyle choices and visits with your health care provider that can promote health and wellness. What does preventive care include? A yearly physical exam. This is also called an annual well check. Dental exams once or twice a year. Routine eye exams. Ask your health care provider how often you should have your eyes checked. Personal lifestyle choices, including: Daily care of your teeth and gums. Regular physical activity. Eating a healthy diet. Avoiding tobacco and drug use. Limiting alcohol use. Practicing safe sex. Taking low-dose aspirin every day. Taking vitamin and mineral supplements as recommended by your health care  provider. What happens during an annual well check? The services and screenings done by your health care provider during your annual well check will depend on your age, overall health, lifestyle risk factors, and family history of disease. Counseling  Your health care provider may ask you questions about your: Alcohol use. Tobacco use. Drug use. Emotional well-being. Home and relationship well-being. Sexual activity. Eating habits. History of falls. Memory and ability to understand (cognition). Work and work Astronomer. Reproductive health. Screening  You may have the following tests or measurements: Height, weight, and BMI. Blood pressure. Lipid and cholesterol levels. These may be checked every 5 years, or more frequently if you are over 33 years old. Skin check. Lung cancer screening. You may have this screening every year starting at age 62 if you have a 30-pack-year history of smoking and currently smoke or have quit within the past 15 years. Fecal occult blood test (FOBT) of the stool. You may have this test every year starting at age 66. Flexible sigmoidoscopy or colonoscopy. You may have a sigmoidoscopy every 5 years or a colonoscopy every 10 years starting at age 44. Hepatitis C blood test. Hepatitis B blood test. Sexually transmitted disease (STD) testing. Diabetes screening. This is done by checking your blood sugar (glucose) after you have not eaten for a while (fasting). You may have this done every 1-3 years. Bone density scan. This is done to screen for osteoporosis. You may have this done starting at age 46. Mammogram. This may be done every 1-2 years. Talk to your health care provider about how often you should have regular mammograms. Talk with your health care provider about your test results, treatment options, and if necessary, the need for more tests. Vaccines  Your health care provider may recommend certain  vaccines, such as: Influenza vaccine. This is  recommended every year. Tetanus, diphtheria, and acellular pertussis (Tdap, Td) vaccine. You may need a Td booster every 10 years. Zoster vaccine. You may need this after age 92. Pneumococcal 13-valent conjugate (PCV13) vaccine. One dose is recommended after age 58. Pneumococcal polysaccharide (PPSV23) vaccine. One dose is recommended after age 83. Talk to your health care provider about which screenings and vaccines you need and how often you need them. This information is not intended to replace advice given to you by your health care provider. Make sure you discuss any questions you have with your health care provider. Document Released: 02/27/2015 Document Revised: 10/21/2015 Document Reviewed: 12/02/2014 Elsevier Interactive Patient Education  2017 [employer].  Fall Prevention in the Home Falls can cause injuries. They can happen to people of all ages. There are many things you can do to make your home safe and to help prevent falls. What can I do on the outside of my home? Regularly fix the edges of walkways and driveways and fix any cracks. Remove anything that might make you trip as you walk through a door, such as a raised step or threshold. Trim any bushes or trees on the path to your home. Use bright outdoor lighting. Clear any walking paths of anything that might make someone trip, such as rocks or tools. Regularly check to see if handrails are loose or broken. Make sure that both sides of any steps have handrails. Any raised decks and porches should have guardrails on the edges. Have any leaves, snow, or ice cleared regularly. Use sand or salt on walking paths during winter. Clean up any spills in your garage right away. This includes oil or grease spills. What can I do in the bathroom? Use night lights. Install grab bars by the toilet and in the tub and shower. Do not use towel bars as grab bars. Use non-skid mats or decals in the tub or shower. If you need to sit down in  the shower, use a plastic, non-slip stool. Keep the floor dry. Clean up any water that spills on the floor as soon as it happens. Remove soap buildup in the tub or shower regularly. Attach bath mats securely with double-sided non-slip rug tape. Do not have throw rugs and other things on the floor that can make you trip. What can I do in the bedroom? Use night lights. Make sure that you have a light by your bed that is easy to reach. Do not use any sheets or blankets that are too big for your bed. They should not hang down onto the floor. Have a firm chair that has side arms. You can use this for support while you get dressed. Do not have throw rugs and other things on the floor that can make you trip. What can I do in the kitchen? Clean up any spills right away. Avoid walking on wet floors. Keep items that you use a lot in easy-to-reach places. If you need to reach something above you, use a strong step stool that has a grab bar. Keep electrical cords out of the way. Do not use floor polish or wax that makes floors slippery. If you must use wax, use non-skid floor wax. Do not have throw rugs and other things on the floor that can make you trip. What can I do with my stairs? Do not leave any items on the stairs. Make sure that there are handrails on both sides of the stairs  and use them. Fix handrails that are broken or loose. Make sure that handrails are as long as the stairways. Check any carpeting to make sure that it is firmly attached to the stairs. Fix any carpet that is loose or worn. Avoid having throw rugs at the top or bottom of the stairs. If you do have throw rugs, attach them to the floor with carpet tape. Make sure that you have a light switch at the top of the stairs and the bottom of the stairs. If you do not have them, ask someone to add them for you. What else can I do to help prevent falls? Wear shoes that: Do not have high heels. Have rubber bottoms. Are comfortable  and fit you well. Are closed at the toe. Do not wear sandals. If you use a stepladder: Make sure that it is fully opened. Do not climb a closed stepladder. Make sure that both sides of the stepladder are locked into place. Ask someone to hold it for you, if possible. Clearly mark and make sure that you can see: Any grab bars or handrails. First and last steps. Where the edge of each step is. Use tools that help you move around (mobility aids) if they are needed. These include: Canes. Walkers. Scooters. Crutches. Turn on the lights when you go into a dark area. Replace any light bulbs as soon as they burn out. Set up your furniture so you have a clear path. Avoid moving your furniture around. If any of your floors are uneven, fix them. If there are any pets around you, be aware of where they are. Review your medicines with your doctor. Some medicines can make you feel dizzy. This can increase your chance of falling. Ask your doctor what other things that you can do to help prevent falls. This information is not intended to replace advice given to you by your health care provider. Make sure you discuss any questions you have with your health care provider. Document Released: 11/27/2008 Document Revised: 07/09/2015 Document Reviewed: 03/07/2014 Elsevier Interactive Patient Education  2017 [employer].

## 2020-08-05 ENCOUNTER — Other Ambulatory Visit: Payer: Self-pay

## 2020-08-05 ENCOUNTER — Ambulatory Visit (INDEPENDENT_AMBULATORY_CARE_PROVIDER_SITE_OTHER): Payer: Medicare Other | Admitting: Family Medicine

## 2020-08-05 ENCOUNTER — Encounter: Payer: Self-pay | Admitting: Family Medicine

## 2020-08-05 VITALS — BP 116/68 | HR 87 | Temp 98.2°F | Ht 62.0 in | Wt 133.0 lb

## 2020-08-05 DIAGNOSIS — E559 Vitamin D deficiency, unspecified: Secondary | ICD-10-CM | POA: Diagnosis not present

## 2020-08-05 DIAGNOSIS — L989 Disorder of the skin and subcutaneous tissue, unspecified: Secondary | ICD-10-CM | POA: Insufficient documentation

## 2020-08-05 DIAGNOSIS — R7989 Other specified abnormal findings of blood chemistry: Secondary | ICD-10-CM | POA: Diagnosis not present

## 2020-08-05 DIAGNOSIS — R7303 Prediabetes: Secondary | ICD-10-CM | POA: Diagnosis not present

## 2020-08-05 DIAGNOSIS — M810 Age-related osteoporosis without current pathological fracture: Secondary | ICD-10-CM | POA: Diagnosis not present

## 2020-08-05 DIAGNOSIS — E7800 Pure hypercholesterolemia, unspecified: Secondary | ICD-10-CM | POA: Diagnosis not present

## 2020-08-05 DIAGNOSIS — Z0000 Encounter for general adult medical examination without abnormal findings: Secondary | ICD-10-CM

## 2020-08-05 DIAGNOSIS — I10 Essential (primary) hypertension: Secondary | ICD-10-CM

## 2020-08-05 MED ORDER — AMLODIPINE BESYLATE 5 MG PO TABS: 5.0000 mg | ORAL_TABLET | Freq: Every day | ORAL | 3 refills | Status: DC

## 2020-08-05 MED ORDER — OLMESARTAN MEDOXOMIL-HCTZ 40-12.5 MG PO TABS: 1.0000 | ORAL_TABLET | Freq: Every day | ORAL | 3 refills | Status: DC

## 2020-08-05 MED ORDER — FLUTICASONE PROPIONATE HFA 44 MCG/ACT IN AERO: INHALATION_SPRAY | RESPIRATORY_TRACT | 3 refills | Status: DC

## 2020-08-05 MED ORDER — ALENDRONATE SODIUM 70 MG PO TABS: 70.0000 mg | ORAL_TABLET | ORAL | 3 refills | Status: DC

## 2020-08-05 MED ORDER — ALBUTEROL SULFATE HFA 108 (90 BASE) MCG/ACT IN AERS: INHALATION_SPRAY | RESPIRATORY_TRACT | 3 refills | Status: AC

## 2020-08-05 MED ORDER — FLUTICASONE PROPIONATE 50 MCG/ACT NA SUSP: 2.0000 | Freq: Every day | NASAL | 3 refills | Status: DC

## 2020-08-05 MED ORDER — MONTELUKAST SODIUM 10 MG PO TABS: 10.0000 mg | ORAL_TABLET | Freq: Every day | ORAL | 3 refills | Status: DC

## 2020-08-05 NOTE — Assessment & Plan Note (Signed)
Area resembles irritated /red sk on R lateral breast  Itches and no imp with cortisone cream Ref done for dermatology

## 2020-08-05 NOTE — Assessment & Plan Note (Signed)
Mildly elevated  Will re check this at f/u with FT4 No clinical changes

## 2020-08-05 NOTE — Assessment & Plan Note (Signed)
Disc goals for lipids and reasons to control them Rev last labs with pt Rev low sat fat diet in detail  Improved LDL with better diet  Commended!  Enc pt to keep that up

## 2020-08-05 NOTE — Assessment & Plan Note (Signed)
Pt never started alendronate as planned in 2019 Disc poss side eff  Px this again with plan of 5 y course if tolerated  Disc need to hold for invasive dental work  No falls or fx  dexa 2019 On ca and D

## 2020-08-05 NOTE — Assessment & Plan Note (Signed)
Level of 59 in setting of OP  Vitamin D level is therapeutic with current supplementation Disc importance of this to bone and overall health

## 2020-08-05 NOTE — Progress Notes (Signed)
Subjective:    Patient ID: Jasmine RoyaltyMattie E Dotter, female    DOB: 1950/12/03, 70 y.o.   MRN: [id number]  This visit occurred during the SARS-CoV-2 public health emergency.  Safety protocols were in place, including screening questions prior to the visit, additional usage of staff PPE, and extensive cleaning of exam room while observing appropriate contact time as indicated for disinfecting solutions.   HPI Here for health maintenance exam and to review chronic medical problems    Wt Readings from Last 3 Encounters:  08/05/20 133 lb (60.3 kg)  01/14/20 133 lb 4 oz (60.4 kg)  12/10/19 132 lb 6 oz (60 kg)   24.33 kg/m Feeling ok overall   Had amw on 6/13-reviewed  Mammogram 12/21 Self breast exam - no lumps /still has an itchy spot   Colonoscopy 4/17  Dexa 9/19 - OP , fosamax was px and she was not compliant  No falls or fractures Supplements ca and D D level is 59  Exercise  - walking and stationary bike    HTN bp is stable today  No cp or palpitations or headaches or edema  No side effects to medicines  BP Readings from Last 3 Encounters:  08/05/20 116/68  01/14/20 135/85  12/10/19 (!) 148/80     Pulse Readings from Last 3 Encounters:  08/05/20 87  01/14/20 84  12/10/19 90  Takes amlodipine 5 mg daily Benicar hct 40-12.5 mg daily  Hyperlipidemia Lab Results  Component Value Date   CHOL 186 07/27/2020   CHOL 213 (H) 02/05/2019   CHOL 206 (H) 11/06/2018   Lab Results  Component Value Date   HDL 74.40 07/27/2020   HDL 73.10 02/05/2019   HDL 75.50 11/06/2018   Lab Results  Component Value Date   LDLCALC 97 07/27/2020   LDLCALC 126 (H) 02/05/2019   LDLCALC 115 (H) 11/06/2018   Lab Results  Component Value Date   TRIG 73.0 07/27/2020   TRIG 70.0 02/05/2019   TRIG 73.0 11/06/2018   Lab Results  Component Value Date   CHOLHDL 2 07/27/2020   CHOLHDL 3 02/05/2019   CHOLHDL 3 11/06/2018   Lab Results  Component Value Date   LDLDIRECT 106.4 02/26/2013    LDLDIRECT 104.2 09/27/2011   LDLDIRECT 86.7 11/27/2007   Improved LDL ! Cut out fried food and fatty foods  Cut back on bacon  Exercising  Using ground [place] instead of ground beef     Prediabetes Lab Results  Component Value Date   HGBA1C 6.7 (H) 07/27/2020  Up from 6.2  Eats well- is mindful  Was eating sweets now stopped   Lab Results  Component Value Date   TSH 4.59 (H) 07/27/2020    Hearing Screening   500Hz  1000Hz  2000Hz  4000Hz   Right ear 40 40 40 40  Left ear 40 40 40 40    Patient Active Problem List   Diagnosis Date Noted   Skin lesion 08/05/2020   Right knee pain 11/22/2019   Elevated TSH 12/14/2015   Vitamin D deficiency 12/06/2015   GERD (gastroesophageal reflux disease) 06/09/2014   Encounter for routine gynecological examination 03/03/2014   Estrogen deficiency 03/03/2014   Heartburn 03/01/2013   Gynecological examination 06/22/2010   Special screening for malignant neoplasms, colon 06/22/2010   Glaucoma suspect 06/22/2010   Routine general medical examination at a health care facility 06/03/2010   Sleep apnea    Osteoporosis 05/20/2009   SLEEP APNEA 10/17/2008   COUGH VARIANT ASTHMA 07/30/2008   Leukocytopenia  01/29/2008   Prediabetes 11/27/2007   HYPERCHOLESTEROLEMIA 03/21/2007   Essential hypertension 03/21/2007   Sinusitis, chronic 03/21/2007   Allergic rhinitis 03/21/2007   DIVERTICULOSIS, COLON 03/21/2007   Past Medical History:  Diagnosis Date   Allergic rhinitis    Arthritis    hip   Borderline high cholesterol    Cataract    Cough variant asthma    Diverticulosis of colon    GERD (gastroesophageal reflux disease)    HTN (hypertension)    Hyperglycemia    borderline DM   OP (osteoporosis)    Seasonal allergies    Sleep apnea    no cpap   Past Surgical History:  Procedure Laterality Date   COLONOSCOPY     COLPOSCOPY     ESOPHAGOGASTRODUODENOSCOPY  10/01   Negative   ROTATOR CUFF REPAIR     right   TUBAL LIGATION      Social History   Tobacco Use   Smoking status: Never   Smokeless tobacco: Never   Tobacco comments:    Remote 2nd hand exposure  Vaping Use   Vaping Use: Never used  Substance Use Topics   Alcohol use: No    Alcohol/week: 0.0 standard drinks   Drug use: No   Family History  Problem Relation Age of Onset   Hypertension Father    Diabetes Father    Diabetes Brother    Diabetes Brother    Cancer Brother        prostate   Cancer Brother        prostate   Cancer Brother        prostate   Cancer Brother        prostate   Cancer Brother        prostate   Cancer Brother        prostate   Breast cancer Maternal Aunt    Colon cancer Neg Hx    Allergies  Allergen Reactions   Augmentin [Amoxicillin-Pot Clavulanate]     GI upset    Sulfonamide Derivatives Rash   Current Outpatient Medications on File Prior to Visit  Medication Sig Dispense Refill   Calcium Carb-Cholecalciferol 600-800 MG-UNIT TABS Take by mouth.     glucose blood (FREESTYLE LITE) test strip To check glucose daily as needed for early diabetes 2 100 each 3   latanoprost (XALATAN) 0.005 % ophthalmic solution Place 1 drop into both eyes at bedtime.      No current facility-administered medications on file prior to visit.     Review of Systems  Constitutional:  Negative for activity change, appetite change, fatigue, fever and unexpected weight change.  HENT:  Negative for congestion, ear pain, rhinorrhea, sinus pressure and sore throat.   Eyes:  Negative for pain, redness and visual disturbance.  Respiratory:  Negative for cough, shortness of breath and wheezing.   Cardiovascular:  Negative for chest pain and palpitations.  Gastrointestinal:  Negative for abdominal pain, blood in stool, constipation and diarrhea.  Endocrine: Negative for polydipsia and polyuria.  Genitourinary:  Negative for dysuria, frequency and urgency.  Musculoskeletal:  Negative for arthralgias, back pain and myalgias.  Skin:   Negative for pallor and rash.       Skin lesion on R breast itches more and bothers her   Allergic/Immunologic: Negative for environmental allergies.  Neurological:  Negative for dizziness, syncope and headaches.  Hematological:  Negative for adenopathy. Does not bruise/bleed easily.  Psychiatric/Behavioral:  Negative for decreased concentration and dysphoric mood. The patient  is not nervous/anxious.       Objective:   Physical Exam Constitutional:      General: She is not in acute distress.    Appearance: Normal appearance. She is well-developed and normal weight. She is not ill-appearing or diaphoretic.  HENT:     Head: Normocephalic and atraumatic.     Right Ear: Tympanic membrane, ear canal and external ear normal.     Left Ear: Tympanic membrane, ear canal and external ear normal.     Nose: Nose normal. No congestion.     Mouth/Throat:     Mouth: Mucous membranes are moist.     Pharynx: Oropharynx is clear. No posterior oropharyngeal erythema.  Eyes:     General: No scleral icterus.    Extraocular Movements: Extraocular movements intact.     Conjunctiva/sclera: Conjunctivae normal.     Pupils: Pupils are equal, round, and reactive to light.  Neck:     Thyroid: No thyromegaly.     Vascular: No carotid bruit or JVD.  Cardiovascular:     Rate and Rhythm: Normal rate and regular rhythm.     Pulses: Normal pulses.     Heart sounds: Normal heart sounds.    No gallop.  Pulmonary:     Effort: Pulmonary effort is normal. No respiratory distress.     Breath sounds: Normal breath sounds. No wheezing.     Comments: Good air exch Chest:     Chest wall: No tenderness.  Abdominal:     General: Bowel sounds are normal. There is no distension or abdominal bruit.     Palpations: Abdomen is soft. There is no mass.     Tenderness: There is no abdominal tenderness.     Hernia: No hernia is present.  Genitourinary:    Comments: Breast exam: No mass, nodules, thickening, tenderness,  bulging, retraction, inflamation, nipple discharge or skin changes noted.  No axillary or clavicular LA.     Musculoskeletal:        General: No tenderness. Normal range of motion.     Cervical back: Normal range of motion and neck supple. No rigidity. No muscular tenderness.     Right lower leg: No edema.     Left lower leg: No edema.     Comments: No kyphosis   Lymphadenopathy:     Cervical: No cervical adenopathy.  Skin:    General: Skin is warm and dry.     Coloration: Skin is not pale.     Findings: No erythema or rash.     Comments: Raised oval lesion R lateral breast -tan with some erythema  Resembles irritated SK  Neurological:     Mental Status: She is alert. Mental status is at baseline.     Cranial Nerves: No cranial nerve deficit.     Motor: No abnormal muscle tone.     Coordination: Coordination normal.     Gait: Gait normal.     Deep Tendon Reflexes: Reflexes are normal and symmetric. Reflexes normal.  Psychiatric:        Mood and Affect: Mood normal.        Cognition and Memory: Cognition and memory normal.     Comments: Pleasant           Assessment & Plan:   Problem List Items Addressed This Visit       Cardiovascular and Mediastinum   Essential hypertension    bp in fair control at this time  BP Readings from Last 1 Encounters:  08/05/20 116/68  No changes needed Most recent labs reviewed  Disc lifstyle change with low sodium diet and exercise  Plan to continue amlodipine 5 mg daily  benicar hct 40-12.5 mg daily        Relevant Medications   olmesartan-hydrochlorothiazide (BENICAR HCT) 40-12.5 MG tablet   amLODipine (NORVASC) 5 MG tablet     Musculoskeletal and Integument   Osteoporosis    Pt never started alendronate as planned in 2019 Disc poss side eff  Px this again with plan of 5 y course if tolerated  Disc need to hold for invasive dental work  No falls or fx  dexa 2019 On ca and D       Relevant Medications   alendronate  (FOSAMAX) 70 MG tablet   Skin lesion    Area resembles irritated /red sk on R lateral breast  Itches and no imp with cortisone cream Ref done for dermatology       Relevant Orders   Ambulatory referral to Dermatology     Other   HYPERCHOLESTEROLEMIA    Disc goals for lipids and reasons to control them Rev last labs with pt Rev low sat fat diet in detail  Improved LDL with better diet  Commended!  Enc pt to keep that up        Relevant Medications   olmesartan-hydrochlorothiazide (BENICAR HCT) 40-12.5 MG tablet   amLODipine (NORVASC) 5 MG tablet   Prediabetes    Lab Results  Component Value Date   HGBA1C 6.7 (H) 07/27/2020  Disc imp of diet control  Declines med at this time disc imp of low glycemic diet and wt loss to prevent DM2  Will f/u for re check in 3 mo       Routine general medical examination at a health care facility - Primary    Reviewed health habits including diet and exercise and skin cancer prevention Reviewed appropriate screening tests for age  Also reviewed health mt list, fam hx and immunization status , as well as social and family history   See HPI Labs reviewed  amw reviewed  utd mammog and colonoscopy  Px alendronate for OP, no falls or fx  Takes ca and D Nl hearing screen         Vitamin D deficiency    Level of 59 in setting of OP  Vitamin D level is therapeutic with current supplementation Disc importance of this to bone and overall health        Elevated TSH    Mildly elevated  Will re check this at f/u with FT4 No clinical changes

## 2020-08-05 NOTE — Patient Instructions (Addendum)
Think about some exercise bands and look up a program That resistance exercise helps bones more   I highly recommend another course of fosamax  If you have side effects or dental surgery then hold it and let me know   For diabetes prevention Try to get most of your carbohydrates from produce (with the exception of white potatoes)  Eat less bread/pasta/rice/snack foods/cereals/sweets and other items from the middle of the grocery store (processed carbs)

## 2020-08-05 NOTE — Assessment & Plan Note (Signed)
Lab Results  Component Value Date   HGBA1C 6.7 (H) 07/27/2020   Disc imp of diet control  Declines med at this time disc imp of low glycemic diet and wt loss to prevent DM2  Will f/u for re check in 3 mo

## 2020-08-05 NOTE — Assessment & Plan Note (Signed)
Reviewed health habits including diet and exercise and skin cancer prevention Reviewed appropriate screening tests for age  Also reviewed health mt list, fam hx and immunization status , as well as social and family history   See HPI Labs reviewed  amw reviewed  utd mammog and colonoscopy  Px alendronate for OP, no falls or fx  Takes ca and D Nl hearing screen

## 2020-08-05 NOTE — Assessment & Plan Note (Signed)
bp in fair control at this time  BP Readings from Last 1 Encounters:  08/05/20 116/68   No changes needed Most recent labs reviewed  Disc lifstyle change with low sodium diet and exercise  Plan to continue amlodipine 5 mg daily  benicar hct 40-12.5 mg daily

## 2020-09-04 ENCOUNTER — Telehealth: Payer: Self-pay

## 2020-09-08 NOTE — Chronic Care Management (AMB) (Addendum)
    Chronic Care Management Pharmacy Assistant   Name: Jasmine Rollins  MRN: [id number] DOB: 25-Dec-1950  Reason for Encounter: Adherence Review    Recent office visits:  08/05/20 - Dr.Tower, PCP - Pt presented for AWV. Start Fosamax 70 mg once weekly. Dermatology referral. Follow up 3 months.  Recent consult visits:  None since last CCM contact  Hospital visits:  None in previous 6 months  Medications: Outpatient Encounter Medications as of 09/04/2020  Medication Sig   albuterol (PROVENTIL HFA) 108 (90 Base) MCG/ACT inhaler INHALE 2 PUFFS EVERY 4 HOURS AS NEEDED   alendronate (FOSAMAX) 70 MG tablet Take 1 tablet (70 mg total) by mouth every 7 (seven) days. Take with a full glass of water on an empty stomach.   amLODipine (NORVASC) 5 MG tablet Take 1 tablet (5 mg total) by mouth daily.   Calcium Carb-Cholecalciferol 600-800 MG-UNIT TABS Take by mouth.   fluticasone (FLONASE) 50 MCG/ACT nasal spray Place 2 sprays into both nostrils daily.   fluticasone (FLOVENT HFA) 44 MCG/ACT inhaler USE 2 INHALATIONS TWICE A DAY (RINSE MOUTH AFTER USE)   glucose blood (FREESTYLE LITE) test strip To check glucose daily as needed for early diabetes 2   latanoprost (XALATAN) 0.005 % ophthalmic solution Place 1 drop into both eyes at bedtime.    montelukast (SINGULAIR) 10 MG tablet Take 1 tablet (10 mg total) by mouth at bedtime.   olmesartan-hydrochlorothiazide (BENICAR HCT) 40-12.5 MG tablet Take 1 tablet by mouth daily.   No facility-administered encounter medications on Rollins as of 09/04/2020.     Contacted Jasmine Rollins on 09/08/2020 for general disease state and medication adherence call.   Patient is not > 5 days past due for refill on the following medications per chart history:  [facility] Medications: Medication Name/mg Last Fill Days Supply Olmesartan 40-12.5mg  08/05/20 90   What concerns do you have about your medications? No she just started Fosamax 70mg  1 tablet every 7 days with a full glass  of water on empty stomach and the patient reports she is managing ok on this medication   The patient denies side effects with her medications.   How often do you forget or accidentally miss a dose? Never  Do you use a pillbox? Yes  Are you having any problems getting your medications from your pharmacy? No  Has the cost of your medications been a concern? No  Since last visit with CPP, the following interventions have been made: encouraged to continue low saturated fat diet   The patient has not had an ED visit since last contact.   The patient denies problems with their health.   she denies  concerns or questions for , Pharm. D at this time.   Counseled patient on:  Phil Dopp job taking medications, Importance of taking medication daily without missed doses, Benefits of adherence packaging or a pillbox, and Access to CCM team for any cost, medication or pharmacy concerns.   Care Gaps: Last annual wellness visit: 08/05/20  PCP appointment on 11/05/2020  11/07/2020, CPP notified  Phil Dopp, [facility] Pharmacy Assistant [phone]  I have reviewed the care management and care coordination activities outlined in this encounter and I am certifying that I agree with the content of this note. No further action required.  [phone], PharmD Clinical Pharmacist [facility] [phone]

## 2020-09-22 ENCOUNTER — Encounter: Payer: Self-pay | Admitting: Family Medicine

## 2020-09-23 DIAGNOSIS — L820 Inflamed seborrheic keratosis: Secondary | ICD-10-CM | POA: Diagnosis not present

## 2020-09-23 MED ORDER — FREESTYLE LITE TEST VI STRP: ORAL_STRIP | 1 refills | Status: AC

## 2020-10-15 DIAGNOSIS — H04123 Dry eye syndrome of bilateral lacrimal glands: Secondary | ICD-10-CM | POA: Diagnosis not present

## 2020-10-15 DIAGNOSIS — H40033 Anatomical narrow angle, bilateral: Secondary | ICD-10-CM | POA: Diagnosis not present

## 2020-10-15 DIAGNOSIS — H2513 Age-related nuclear cataract, bilateral: Secondary | ICD-10-CM | POA: Diagnosis not present

## 2020-10-15 DIAGNOSIS — H524 Presbyopia: Secondary | ICD-10-CM | POA: Diagnosis not present

## 2020-10-15 DIAGNOSIS — H401131 Primary open-angle glaucoma, bilateral, mild stage: Secondary | ICD-10-CM | POA: Diagnosis not present

## 2020-10-30 ENCOUNTER — Encounter: Payer: Self-pay | Admitting: Family Medicine

## 2020-11-05 ENCOUNTER — Other Ambulatory Visit: Payer: Self-pay

## 2020-11-05 ENCOUNTER — Encounter: Payer: Self-pay | Admitting: Family Medicine

## 2020-11-05 ENCOUNTER — Telehealth (INDEPENDENT_AMBULATORY_CARE_PROVIDER_SITE_OTHER): Payer: Medicare Other | Admitting: Family Medicine

## 2020-11-05 VITALS — BP 119/81 | HR 81 | Temp 97.7°F | Wt 128.6 lb

## 2020-11-05 DIAGNOSIS — R7989 Other specified abnormal findings of blood chemistry: Secondary | ICD-10-CM | POA: Diagnosis not present

## 2020-11-05 DIAGNOSIS — R7303 Prediabetes: Secondary | ICD-10-CM

## 2020-11-05 DIAGNOSIS — G4709 Other insomnia: Secondary | ICD-10-CM | POA: Diagnosis not present

## 2020-11-05 DIAGNOSIS — G4700 Insomnia, unspecified: Secondary | ICD-10-CM | POA: Insufficient documentation

## 2020-11-05 NOTE — Progress Notes (Signed)
Virtual Visit via Video Note  I connected with [name] on 11/05/20 at  9:30 AM EDT by a video enabled telemedicine application and verified that I am speaking with the correct person using two identifiers.  Location: Patient: home Provider: office   I discussed the limitations of evaluation and management by telemedicine and the availability of in person appointments. The patient expressed understanding and agreed to proceed.  Parties involved in encounter  Patient: Jasmine Rollins   Provider:  Roxy Manns MD   History of Present Illness: Pt presents for f/u of prediabetes and thyroid   Wt Readings from Last 3 Encounters:  11/05/20 128 lb 9 oz (58.3 kg)  08/05/20 133 lb (60.3 kg)  01/14/20 133 lb 4 oz (60.4 kg)   23.51 kg/m  Doing well overall  Feeling good    Prediabetes Lab Results  Component Value Date   HGBA1C 6.7 (H) 07/27/2020   This was up  She is trying hard to be better   Diet is improved/ much less sugar  Has occasional cookie/not often  Got away from candy and sweets  Stopped sugary drinks  Wt is down 5 lb  Carbs- she has cut back as much as she can  Not eating biscuits  Cut back on rice    Bike for exercise  Walks when she can - when not too hot  About 30 minutes daily    Elevated Lab Results  Component Value Date   TSH 4.59 (H) 07/27/2020    Needs a re check with FT4   Not tired or sluggish  Not sleeping well however -wakes up early  No skin or hair changes  No goiter   Does not drink caffeine but occ cup of decaf at night  No sudafed  Not very stressed     Patient Active Problem List   Diagnosis Date Noted   Skin lesion 08/05/2020   Right knee pain 11/22/2019   Elevated TSH 12/14/2015   Vitamin D deficiency 12/06/2015   GERD (gastroesophageal reflux disease) 06/09/2014   Encounter for routine gynecological examination 03/03/2014   Estrogen deficiency 03/03/2014   Heartburn 03/01/2013   Gynecological examination  06/22/2010   Special screening for malignant neoplasms, colon 06/22/2010   Glaucoma suspect 06/22/2010   Routine general medical examination at a health care facility 06/03/2010   Sleep apnea    Osteoporosis 05/20/2009   SLEEP APNEA 10/17/2008   COUGH VARIANT ASTHMA 07/30/2008   Leukocytopenia 01/29/2008   Prediabetes 11/27/2007   HYPERCHOLESTEROLEMIA 03/21/2007   Essential hypertension 03/21/2007   Sinusitis, chronic 03/21/2007   Allergic rhinitis 03/21/2007   DIVERTICULOSIS, COLON 03/21/2007   Past Medical History:  Diagnosis Date   Allergic rhinitis    Arthritis    hip   Borderline high cholesterol    Cataract    Cough variant asthma    Diverticulosis of colon    GERD (gastroesophageal reflux disease)    HTN (hypertension)    Hyperglycemia    borderline DM   OP (osteoporosis)    Seasonal allergies    Sleep apnea    no cpap   Past Surgical History:  Procedure Laterality Date   COLONOSCOPY     COLPOSCOPY     ESOPHAGOGASTRODUODENOSCOPY  10/01   Negative   ROTATOR CUFF REPAIR     right   TUBAL LIGATION     Social History   Tobacco Use   Smoking status: Never   Smokeless tobacco: Never   Tobacco comments:  Remote 2nd hand exposure  Vaping Use   Vaping Use: Never used  Substance Use Topics   Alcohol use: No    Alcohol/week: 0.0 standard drinks   Drug use: No   Family History  Problem Relation Age of Onset   Hypertension Father    Diabetes Father    Diabetes Brother    Diabetes Brother    Cancer Brother        prostate   Cancer Brother        prostate   Cancer Brother        prostate   Cancer Brother        prostate   Cancer Brother        prostate   Cancer Brother        prostate   Breast cancer Maternal Aunt    Colon cancer Neg Hx    Allergies  Allergen Reactions   Augmentin [Amoxicillin-Pot Clavulanate]     GI upset    Sulfonamide Derivatives Rash   Current Outpatient Medications on File Prior to Visit  Medication Sig Dispense  Refill   albuterol (PROVENTIL HFA) 108 (90 Base) MCG/ACT inhaler INHALE 2 PUFFS EVERY 4 HOURS AS NEEDED 18 g 3   alendronate (FOSAMAX) 70 MG tablet Take 1 tablet (70 mg total) by mouth every 7 (seven) days. Take with a full glass of water on an empty stomach. 12 tablet 3   amLODipine (NORVASC) 5 MG tablet Take 1 tablet (5 mg total) by mouth daily. 90 tablet 3   Calcium Carb-Cholecalciferol 600-800 MG-UNIT TABS Take by mouth.     fluticasone (FLONASE) 50 MCG/ACT nasal spray Place 2 sprays into both nostrils daily. 48 g 3   fluticasone (FLOVENT HFA) 44 MCG/ACT inhaler USE 2 INHALATIONS TWICE A DAY (RINSE MOUTH AFTER USE) 31.8 g 3   glucose blood (FREESTYLE LITE) test strip To check glucose once daily (dx. R73.03) 100 each 1   latanoprost (XALATAN) 0.005 % ophthalmic solution Place 1 drop into both eyes at bedtime.      montelukast (SINGULAIR) 10 MG tablet Take 1 tablet (10 mg total) by mouth at bedtime. 90 tablet 3   olmesartan-hydrochlorothiazide (BENICAR HCT) 40-12.5 MG tablet Take 1 tablet by mouth daily. 90 tablet 3   No current facility-administered medications on file prior to visit.   Review of Systems  Constitutional:  Negative for chills, fever and malaise/fatigue.  HENT:  Negative for congestion, ear pain, sinus pain and sore throat.   Eyes:  Negative for blurred vision, discharge and redness.  Respiratory:  Negative for cough, shortness of breath and stridor.   Cardiovascular:  Negative for chest pain, palpitations and leg swelling.  Gastrointestinal:  Negative for abdominal pain, diarrhea, nausea and vomiting.  Musculoskeletal:  Negative for myalgias.  Skin:  Negative for rash.  Neurological:  Negative for dizziness and headaches.  Psychiatric/Behavioral:  The patient has insomnia.    Observations/Objective: Patient appears well, in no distress Weight is baseline  No facial swelling or asymmetry Normal voice-not hoarse and no slurred speech No obvious tremor or mobility  impairment Moving neck and UEs normally Able to hear the call well  No cough or shortness of breath during interview  Talkative and mentally sharp with no cognitive changes No skin changes on face or neck , no rash or pallor Affect is normal    Assessment and Plan: Problem List Items Addressed This Visit       Other   Prediabetes - Primary  Making a good diet and exercise effort Commended Wt is down 5 lb  Discussed low glycemic diet  a1c ordered for [employer] office Last a1c was 6.7 -expect this to be lower        Relevant Orders   Hemoglobin A1c   Elevated TSH    Lab Results  Component Value Date   TSH 4.59 (H) 07/27/2020  Mildly elevated  No symptoms of hypothyroidism and no h/o goiter Will plan labs to re check this with FT4 and treat if needed      Relevant Orders   TSH   T4, free   Insomnia    Pt is waking up early/unable to go back to sleep  Discussed sleep hygiene Discussed caffeine avoidance  Good exercise Enc outdoor time during the day  Handout included in avs Enc to update if worse or no imp        Follow Up Instructions: I ordered labs for you to get at [facility] including A1C and TSH and free T4 Keep up the good work with diet and exercise  Try to get most of your carbohydrates from produce (with the exception of white potatoes)  Eat less bread/pasta/rice/snack foods/cereals/sweets and other items from the middle of the grocery store (processed carbs)  Avoid excess caffeine for better sleep   I discussed the assessment and treatment plan with the patient. The patient was provided an opportunity to ask questions and all were answered. The patient agreed with the plan and demonstrated an understanding of the instructions.   The patient was advised to call back or seek an in-person evaluation if the symptoms worsen or if the condition fails to improve as anticipated.     Roxy Manns, MD

## 2020-11-05 NOTE — Assessment & Plan Note (Signed)
Making a good diet and exercise effort Commended Wt is down 5 lb  Discussed low glycemic diet  a1c ordered for [employer] office Last a1c was 6.7 -expect this to be lower

## 2020-11-05 NOTE — Assessment & Plan Note (Signed)
Pt is waking up early/unable to go back to sleep  Discussed sleep hygiene Discussed caffeine avoidance  Good exercise Enc outdoor time during the day  Handout included in avs Enc to update if worse or no imp

## 2020-11-05 NOTE — Patient Instructions (Signed)
I ordered labs for you to get at [facility] including A1C and TSH and free T4 Keep up the good work with diet and exercise  Try to get most of your carbohydrates from produce (with the exception of white potatoes)  Eat less bread/pasta/rice/snack foods/cereals/sweets and other items from the middle of the grocery store (processed carbs)  Avoid excess caffeine for better sleep

## 2020-11-05 NOTE — Assessment & Plan Note (Signed)
Lab Results  Component Value Date   TSH 4.59 (H) 07/27/2020   Mildly elevated  No symptoms of hypothyroidism and no h/o goiter Will plan labs to re check this with FT4 and treat if needed

## 2020-11-06 ENCOUNTER — Other Ambulatory Visit: Payer: Self-pay

## 2020-11-06 ENCOUNTER — Other Ambulatory Visit (INDEPENDENT_AMBULATORY_CARE_PROVIDER_SITE_OTHER): Payer: Medicare Other

## 2020-11-06 DIAGNOSIS — R7303 Prediabetes: Secondary | ICD-10-CM | POA: Diagnosis not present

## 2020-11-06 DIAGNOSIS — R7989 Other specified abnormal findings of blood chemistry: Secondary | ICD-10-CM | POA: Diagnosis not present

## 2020-11-06 LAB — HEMOGLOBIN A1C: Hgb A1c MFr Bld: 6.4 % (ref 4.6–6.5)

## 2020-11-06 LAB — TSH: TSH: 4.51 u[IU]/mL (ref 0.35–5.50)

## 2020-11-06 LAB — T4, FREE: Free T4: 0.65 ng/dL (ref 0.60–1.60)

## 2020-11-20 ENCOUNTER — Other Ambulatory Visit: Payer: Self-pay

## 2020-11-20 ENCOUNTER — Encounter: Payer: Self-pay | Admitting: Family Medicine

## 2020-11-20 ENCOUNTER — Ambulatory Visit (INDEPENDENT_AMBULATORY_CARE_PROVIDER_SITE_OTHER): Payer: Medicare Other | Admitting: Family Medicine

## 2020-11-20 VITALS — BP 126/70 | HR 72 | Temp 98.0°F | Ht 62.0 in | Wt 130.0 lb

## 2020-11-20 DIAGNOSIS — M62838 Other muscle spasm: Secondary | ICD-10-CM

## 2020-11-20 DIAGNOSIS — M542 Cervicalgia: Secondary | ICD-10-CM | POA: Insufficient documentation

## 2020-11-20 MED ORDER — DICLOFENAC SODIUM 75 MG PO TBEC: 75.0000 mg | DELAYED_RELEASE_TABLET | Freq: Two times a day (BID) | ORAL | 0 refills | Status: DC

## 2020-11-20 MED ORDER — CYCLOBENZAPRINE HCL 10 MG PO TABS: 5.0000 mg | ORAL_TABLET | Freq: Every evening | ORAL | 0 refills | Status: DC | PRN

## 2020-11-20 NOTE — Assessment & Plan Note (Signed)
No red flags.  Has history of osteoporosis but no vertebral pain and no fall.  Start anti-inflammatory diclofenac ( in place of Advil) twice daily on full stomach.  Can try muscle relaxant at bedtime.  Apply heat, start home stretching as tolerated, massage as tolerated.  If not improving as expected in 7-10 days.. call for follow up appt and we will consider and X-ray.

## 2020-11-20 NOTE — Progress Notes (Signed)
Patient ID: Jasmine Rollins, female    DOB: December 26, 1950, 70 y.o.   MRN: [id number]  This visit was conducted in person.  BP 126/70   Pulse 72   Temp 98 F (36.7 C) (Temporal)   Ht 5\' 2"  (1.575 m)   Wt 130 lb (59 kg)   SpO2 99%   BMI 23.78 kg/m    CC: Chief Complaint  Patient presents with   Neck Pain   Headache    Subjective:   HPI: Jasmine Rollins is a 70 y.o. female patient of  Dr. 62 with history of  HTN  and osteoporosis ( on alendronate) presenting on 11/20/2020 for Neck Pain and Headache    She reports she has had neck pain x several weeks. She awoke with is several weeks ago, figured she slept wrong.  Tried new pillow.  Used 400 mg Advil every 6 hours.. helped  but stopped because wary about medication.  Used Aspercream... made it worse.   Pain now in left lower neck and base of head centrally.  She is unable to move head to left side.  Interfering with sleep some.   Pain had resolved yesterday but she was very active yesterday and pain returned today.   No vision change, no neuro changes.  No fall, no change in activity.  No radiation of pain into hand, no numbness, no weakness.  BP Readings from Last 3 Encounters:  11/20/20 126/70  11/05/20 119/81  08/05/20 116/68   She had similar issue 6-7 months ago.. but lasted 1 week.    No fever.  Relevant past medical, surgical, family and social history reviewed and updated as indicated. Interim medical history since our last visit reviewed. Allergies and medications reviewed and updated. Outpatient Medications Prior to Visit  Medication Sig Dispense Refill   albuterol (PROVENTIL HFA) 108 (90 Base) MCG/ACT inhaler INHALE 2 PUFFS EVERY 4 HOURS AS NEEDED 18 g 3   alendronate (FOSAMAX) 70 MG tablet Take 1 tablet (70 mg total) by mouth every 7 (seven) days. Take with a full glass of water on an empty stomach. 12 tablet 3   amLODipine (NORVASC) 5 MG tablet Take 1 tablet (5 mg total) by mouth daily. 90 tablet 3    Calcium Carb-Cholecalciferol 600-800 MG-UNIT TABS Take by mouth.     fluticasone (FLONASE) 50 MCG/ACT nasal spray Place 2 sprays into both nostrils daily. 48 g 3   fluticasone (FLOVENT HFA) 44 MCG/ACT inhaler USE 2 INHALATIONS TWICE A DAY (RINSE MOUTH AFTER USE) 31.8 g 3   glucose blood (FREESTYLE LITE) test strip To check glucose once daily (dx. R73.03) 100 each 1   latanoprost (XALATAN) 0.005 % ophthalmic solution Place 1 drop into both eyes at bedtime.      montelukast (SINGULAIR) 10 MG tablet Take 1 tablet (10 mg total) by mouth at bedtime. 90 tablet 3   olmesartan-hydrochlorothiazide (BENICAR HCT) 40-12.5 MG tablet Take 1 tablet by mouth daily. 90 tablet 3   No facility-administered medications prior to visit.     Per HPI unless specifically indicated in ROS section below Review of Systems  Constitutional:  Negative for fatigue and fever.  HENT:  Negative for ear pain.   Eyes:  Negative for pain.  Respiratory:  Negative for chest tightness and shortness of breath.   Cardiovascular:  Negative for chest pain, palpitations and leg swelling.  Gastrointestinal:  Negative for abdominal pain.  Genitourinary:  Negative for dysuria.  Objective:  BP 126/70  Pulse 72   Temp 98 F (36.7 C) (Temporal)   Ht 5\' 2"  (1.575 m)   Wt 130 lb (59 kg)   SpO2 99%   BMI 23.78 kg/m   Wt Readings from Last 3 Encounters:  11/20/20 130 lb (59 kg)  11/05/20 128 lb 9 oz (58.3 kg)  08/05/20 133 lb (60.3 kg)      Physical Exam Constitutional:      General: She is not in acute distress.    Appearance: Normal appearance. She is well-developed. She is not ill-appearing or toxic-appearing.  HENT:     Head: Normocephalic.     Right Ear: Hearing, tympanic membrane, ear canal and external ear normal. Tympanic membrane is not erythematous, retracted or bulging.     Left Ear: Hearing, tympanic membrane, ear canal and external ear normal. Tympanic membrane is not erythematous, retracted or bulging.      Nose: No mucosal edema or rhinorrhea.     Right Sinus: No maxillary sinus tenderness or frontal sinus tenderness.     Left Sinus: No maxillary sinus tenderness or frontal sinus tenderness.     Mouth/Throat:     Pharynx: Uvula midline.  Eyes:     General: Lids are normal. Lids are everted, no foreign bodies appreciated.     Conjunctiva/sclera: Conjunctivae normal.     Pupils: Pupils are equal, round, and reactive to light.  Neck:     Thyroid: No thyroid mass or thyromegaly.     Vascular: No carotid bruit.     Trachea: Trachea normal.      Comments:  Focal area of ttp in attachment of trapezius and right side of trapezius upper Cardiovascular:     Rate and Rhythm: Normal rate and regular rhythm.     Pulses: Normal pulses.     Heart sounds: Normal heart sounds, S1 normal and S2 normal. No murmur heard.   No friction rub. No gallop.  Pulmonary:     Effort: Pulmonary effort is normal. No tachypnea or respiratory distress.     Breath sounds: Normal breath sounds. No decreased breath sounds, wheezing, rhonchi or rales.  Abdominal:     General: Bowel sounds are normal.     Palpations: Abdomen is soft.     Tenderness: There is no abdominal tenderness.  Musculoskeletal:     Cervical back: Normal range of motion and neck supple. Pain with movement and muscular tenderness present. No spinous process tenderness.  Skin:    General: Skin is warm and dry.     Findings: No rash.  Neurological:     Mental Status: She is alert.  Psychiatric:        Mood and Affect: Mood is not anxious or depressed.        Speech: Speech normal.        Behavior: Behavior normal. Behavior is cooperative.        Thought Content: Thought content normal.        Judgment: Judgment normal.      Results for orders placed or performed in visit on 11/06/20  Hemoglobin A1c  Result Value Ref Range   Hgb A1c MFr Bld 6.4 4.6 - 6.5 %  T4, free  Result Value Ref Range   Free T4 0.65 0.60 - 1.60 ng/dL  TSH  Result  Value Ref Range   TSH 4.51 0.35 - 5.50 uIU/mL    This visit occurred during the SARS-CoV-2 public health emergency.  Safety protocols were in place, including screening questions prior to  the visit, additional usage of staff PPE, and extensive cleaning of exam room while observing appropriate contact time as indicated for disinfecting solutions.   COVID 19 screen:  No recent travel or known exposure to COVID19 The patient denies respiratory symptoms of COVID 19 at this time. The importance of social distancing was discussed today.   Assessment and Plan    Problem List Items Addressed This Visit     Trapezius muscle spasm - Primary    No red flags.  Has history of osteoporosis but no vertebral pain and no fall.  Start anti-inflammatory diclofenac ( in place of Advil) twice daily on full stomach.  Can try muscle relaxant at bedtime.  Apply heat, start home stretching as tolerated, massage as tolerated.  If not improving as expected in 7-10 days.. call for follow up appt and we will consider and X-ray.      Meds ordered this encounter  Medications   diclofenac (VOLTAREN) 75 MG EC tablet    Sig: Take 1 tablet (75 mg total) by mouth 2 (two) times daily.    Dispense:  30 tablet    Refill:  0   cyclobenzaprine (FLEXERIL) 10 MG tablet    Sig: Take 0.5-1 tablets (5-10 mg total) by mouth at bedtime as needed for muscle spasms.    Dispense:  15 tablet    Refill:  0    Kerby Nora, MD

## 2020-11-20 NOTE — Patient Instructions (Addendum)
Start anti-inflammatory diclofenac ( in place of Advil) twice daily on full stomach.  Can try muscle relaxant at bedtime.  Apply heat, start home stretching as tolerated, massage as tolerated.  If not improving as expected in 7-10 days.. call for follow up appt and we will consider and X-ray.

## 2020-11-29 ENCOUNTER — Encounter (HOSPITAL_COMMUNITY): Payer: Self-pay | Admitting: Emergency Medicine

## 2020-11-29 ENCOUNTER — Other Ambulatory Visit: Payer: Self-pay

## 2020-11-29 ENCOUNTER — Ambulatory Visit (INDEPENDENT_AMBULATORY_CARE_PROVIDER_SITE_OTHER): Payer: Medicare Other

## 2020-11-29 ENCOUNTER — Ambulatory Visit (HOSPITAL_COMMUNITY)
Admission: EM | Admit: 2020-11-29 | Discharge: 2020-11-29 | Disposition: A | Discharge status: Home / Self Care | Payer: Medicare Other | Attending: Emergency Medicine | Admitting: Emergency Medicine

## 2020-11-29 DIAGNOSIS — M542 Cervicalgia: Secondary | ICD-10-CM | POA: Diagnosis not present

## 2020-11-29 DIAGNOSIS — S161XXA Strain of muscle, fascia and tendon at neck level, initial encounter: Secondary | ICD-10-CM

## 2020-11-29 MED ORDER — KETOROLAC TROMETHAMINE 30 MG/ML IJ SOLN
30.0000 mg | Freq: Once | INTRAMUSCULAR | Status: AC
Start: 2020-11-29 — End: 2020-11-29
  Administered 2020-11-29: 30 mg via INTRAMUSCULAR

## 2020-11-29 MED ORDER — TIZANIDINE HCL 4 MG PO TABS: 4.0000 mg | ORAL_TABLET | Freq: Four times a day (QID) | ORAL | 0 refills | Status: DC | PRN

## 2020-11-29 MED ORDER — KETOROLAC TROMETHAMINE 30 MG/ML IJ SOLN
INTRAMUSCULAR | Status: AC
  Filled 2020-11-29: qty 1

## 2020-11-29 MED ORDER — ETODOLAC 400 MG PO TABS: 400.0000 mg | ORAL_TABLET | Freq: Two times a day (BID) | ORAL | 0 refills | Status: DC

## 2020-11-29 NOTE — Discharge Instructions (Addendum)
Take the Lodine twice a day for your neck pain.  You can take the tizanidine as needed for muscle pain and spasms.  Do not take it before driving as it can make you sleepy.   Stop the diclofenac and flexeril.   You can use heat, ice or alternate between heat and ice for comfort. You can use lidocaine patches, IcyHot, Biofreeze, Aspercreme, or Voltaren gel as needed for pain relief.   Follow up with orthopedics for re-evaluation and further management of neck pain.

## 2020-11-29 NOTE — ED Triage Notes (Signed)
PT reports she "sprained her neck" a week ago and was given diclofenac and muscle relaxer. She has used these for a week with no relief. Was told she may need xray if not improving. Complains of posterior headache as well.

## 2020-11-29 NOTE — ED Provider Notes (Signed)
[facility]    CSN: [id number] Arrival date & time: 11/29/20  1000      History   Chief Complaint Chief Complaint  Patient presents with   Neck Pain   Headache    HPI Jasmine Rollins is a 70 y.o. female.   Patient here for evaluation of neck pain and headache that has been ongoing for the past week.  Reports that she was seen at her PCP and told that she was having muscle spasms and "sprained her neck."  She was given diclofenac and Flexeril.  Reports taking these medications but symptoms have not improved.  Reports that her PCP said that if she did not have improvement in symptoms the next week that she needed to come in for an x-ray.  Reports symptoms are better some days and worse others.  Reports pain worse with movement.  Reports pain primarily on left side of neck and radiates up into posterior head.  Denies any dizziness or blurred vision.  Denies any trauma, injury, or other precipitating event.  Denies any fevers, chest pain, shortness of breath, N/V/D, numbness, tingling, weakness, abdominal pain.    The history is provided by the patient.  Neck Pain Associated symptoms: headaches   Headache Associated symptoms: neck pain    Past Medical History:  Diagnosis Date   Allergic rhinitis    Arthritis    hip   Borderline high cholesterol    Cataract    Cough variant asthma    Diverticulosis of colon    GERD (gastroesophageal reflux disease)    HTN (hypertension)    Hyperglycemia    borderline DM   OP (osteoporosis)    Seasonal allergies    Sleep apnea    no cpap    Patient Active Problem List   Diagnosis Date Noted   Trapezius muscle spasm 11/20/2020   Insomnia 11/05/2020   Skin lesion 08/05/2020   Right knee pain 11/22/2019   Elevated TSH 12/14/2015   Vitamin D deficiency 12/06/2015   GERD (gastroesophageal reflux disease) 06/09/2014   Encounter for routine gynecological examination 03/03/2014   Estrogen deficiency 03/03/2014   Heartburn  03/01/2013   Gynecological examination 06/22/2010   Special screening for malignant neoplasms, colon 06/22/2010   Glaucoma suspect 06/22/2010   Routine general medical examination at a health care facility 06/03/2010   Sleep apnea    Osteoporosis 05/20/2009   SLEEP APNEA 10/17/2008   COUGH VARIANT ASTHMA 07/30/2008   Leukocytopenia 01/29/2008   Prediabetes 11/27/2007   HYPERCHOLESTEROLEMIA 03/21/2007   Essential hypertension 03/21/2007   Sinusitis, chronic 03/21/2007   Allergic rhinitis 03/21/2007   DIVERTICULOSIS, COLON 03/21/2007    Past Surgical History:  Procedure Laterality Date   COLONOSCOPY     COLPOSCOPY     ESOPHAGOGASTRODUODENOSCOPY  10/01   Negative   ROTATOR CUFF REPAIR     right   TUBAL LIGATION      OB History   No obstetric history on file.      Home Medications    Prior to Admission medications   Medication Sig Start Date End Date Taking? Authorizing Provider  amLODipine (NORVASC) 5 MG tablet Take 1 tablet (5 mg total) by mouth daily. 08/05/20  Yes Tower, Audrie Gallus, MD  etodolac (LODINE) 400 MG tablet Take 1 tablet (400 mg total) by mouth 2 (two) times daily. 11/29/20  Yes Ivette Loyal, NP  olmesartan-hydrochlorothiazide (BENICAR HCT) 40-12.5 MG tablet Take 1 tablet by mouth daily. 08/05/20  Yes Tower, [employer]	[place],  MD  tiZANidine (ZANAFLEX) 4 MG tablet Take 1 tablet (4 mg total) by mouth every 6 (six) hours as needed for muscle spasms. 11/29/20  Yes Ivette Loyal, NP  albuterol (PROVENTIL HFA) 108 (90 Base) MCG/ACT inhaler INHALE 2 PUFFS EVERY 4 HOURS AS NEEDED 08/05/20   Tower, Audrie Gallus, MD  alendronate (FOSAMAX) 70 MG tablet Take 1 tablet (70 mg total) by mouth every 7 (seven) days. Take with a full glass of water on an empty stomach. 08/05/20   Tower, Audrie Gallus, MD  Calcium Carb-Cholecalciferol 600-800 MG-UNIT TABS Take by mouth.    [provider]  fluticasone (FLONASE) 50 MCG/ACT nasal spray Place 2 sprays into both nostrils daily. 08/05/20   Tower,  Audrie Gallus, MD  fluticasone (FLOVENT HFA) 44 MCG/ACT inhaler USE 2 INHALATIONS TWICE A DAY (RINSE MOUTH AFTER USE) 08/05/20   Tower, Audrie Gallus, MD  glucose blood (FREESTYLE LITE) test strip To check glucose once daily (dx. R73.03) 09/23/20   Tower, Audrie Gallus, MD  latanoprost (XALATAN) 0.005 % ophthalmic solution Place 1 drop into both eyes at bedtime.  04/16/15   [provider]  montelukast (SINGULAIR) 10 MG tablet Take 1 tablet (10 mg total) by mouth at bedtime. 08/05/20   Tower, Audrie Gallus, MD    Family History Family History  Problem Relation Age of Onset   Hypertension Father    Diabetes Father    Diabetes Brother    Diabetes Brother    Cancer Brother        prostate   Cancer Brother        prostate   Cancer Brother        prostate   Cancer Brother        prostate   Cancer Brother        prostate   Cancer Brother        prostate   Breast cancer Maternal Aunt    Colon cancer Neg Hx     Social History Social History   Tobacco Use   Smoking status: Never   Smokeless tobacco: Never   Tobacco comments:    Remote 2nd hand exposure  Vaping Use   Vaping Use: Never used  Substance Use Topics   Alcohol use: No    Alcohol/week: 0.0 standard drinks   Drug use: No     Allergies   Augmentin [amoxicillin-pot clavulanate] and Sulfonamide derivatives   Review of Systems Review of Systems  Musculoskeletal:  Positive for neck pain.  Neurological:  Positive for headaches.  All other systems reviewed and are negative.   Physical Exam Triage Vital Signs ED Triage Vitals [11/29/20 1021]  Enc Vitals Group     BP (!) 157/89     Pulse Rate 71     Resp 16     Temp 98.3 F (36.8 C)     Temp Source Oral     SpO2 98 %     Weight      Height      Head Circumference      Peak Flow      Pain Score 10     Pain Loc      Pain Edu?      Excl. in GC?    No data found.  Updated Vital Signs BP (!) 157/89   Pulse 71   Temp 98.3 F (36.8 C) (Oral)   Resp 16   SpO2 98%    Visual Acuity Right Eye Distance:   Left Eye Distance:  Bilateral Distance:    Right Eye Near:   Left Eye Near:    Bilateral Near:     Physical Exam Vitals and nursing note reviewed.  Constitutional:      General: She is not in acute distress.    Appearance: Normal appearance. She is not ill-appearing, toxic-appearing or diaphoretic.  HENT:     Head: Normocephalic and atraumatic.  Eyes:     Conjunctiva/sclera: Conjunctivae normal.  Cardiovascular:     Rate and Rhythm: Normal rate.     Pulses: Normal pulses.  Pulmonary:     Effort: Pulmonary effort is normal.  Abdominal:     General: Abdomen is flat.  Musculoskeletal:     Cervical back: Spasms and tenderness present. No edema, signs of trauma or rigidity. Muscular tenderness (left side of neck) present. No spinous process tenderness. Decreased range of motion (due to pain).     Thoracic back: Normal.     Lumbar back: Normal.  Skin:    General: Skin is warm and dry.  Neurological:     General: No focal deficit present.     Mental Status: She is alert and oriented to person, place, and time.  Psychiatric:        Mood and Affect: Mood normal.     UC Treatments / Results  Labs (all labs ordered are listed, but only abnormal results are displayed) Labs Reviewed - No data to display  EKG   Radiology DG Cervical Spine Complete  Result Date: 11/29/2020 CLINICAL DATA:  Neck pain and spasms EXAM: CERVICAL SPINE - COMPLETE 4+ VIEW COMPARISON:  None. FINDINGS: Normal alignment. Bones are osteopenic. Normal prevertebral soft tissues. Degenerative disc disease most severe at C5-6 with marked disc space narrowing, sclerosis and osteophyte formation. Additional degenerative changes at C6-7 and multilevel facet arthropathy noted posteriorly. Trachea midline.  Lung apices are clear. IMPRESSION: Lower cervical degenerative changes as above, most pronounced at C5-6. No acute finding or malalignment by plain radiography.  Electronically Signed   By: Judie Petit.  Shick M.D.   On: 11/29/2020 10:50    Procedures Procedures (including critical care time)  Medications Ordered in UC Medications  ketorolac (TORADOL) 30 MG/ML injection 30 mg (has no administration in time range)    Initial Impression / Assessment and Plan / UC Course  I have reviewed the triage vital signs and the nursing notes.  Pertinent labs & imaging results that were available during my care of the patient were reviewed by me and considered in my medical decision making (see chart for details).    Assessment negative for red flags or concerns.  X-ray shows some degenerative disc changes but no acute bony abnormalities.  Toradol IM administered in office.  We will change medications from the diclofenac and Flexeril to Lodine twice a day and tizanidine as needed.  Patient instructed not to drive while taking tizanidine as it can cause drowsiness.  May use heat, ice, and OTC pain relievers as needed for comfort.  Follow-up with orthopedics and PCP for reevaluation and further management of neck pain. Final Clinical Impressions(s) / UC Diagnoses   Final diagnoses:  Acute strain of neck muscle, initial encounter     Discharge Instructions      Take the Lodine twice a day for your neck pain.  You can take the tizanidine as needed for muscle pain and spasms.  Do not take it before driving as it can make you sleepy.   Stop the diclofenac and flexeril.   You can  use heat, ice or alternate between heat and ice for comfort. You can use lidocaine patches, IcyHot, Biofreeze, Aspercreme, or Voltaren gel as needed for pain relief.   Follow up with orthopedics for re-evaluation and further management of neck pain.      ED Prescriptions     Medication Sig Dispense Auth. Provider   etodolac (LODINE) 400 MG tablet Take 1 tablet (400 mg total) by mouth 2 (two) times daily. 30 tablet Ivette Loyal, NP   tiZANidine (ZANAFLEX) 4 MG tablet Take 1 tablet (4  mg total) by mouth every 6 (six) hours as needed for muscle spasms. 30 tablet Ivette Loyal, NP      PDMP not reviewed this encounter.   Ivette Loyal, NP 11/29/20 1119

## 2020-12-02 ENCOUNTER — Ambulatory Visit (INDEPENDENT_AMBULATORY_CARE_PROVIDER_SITE_OTHER): Payer: Medicare Other | Admitting: Family Medicine

## 2020-12-02 ENCOUNTER — Encounter: Payer: Self-pay | Admitting: Family Medicine

## 2020-12-02 ENCOUNTER — Other Ambulatory Visit: Payer: Self-pay | Admitting: Family Medicine

## 2020-12-02 ENCOUNTER — Other Ambulatory Visit: Payer: Self-pay

## 2020-12-02 VITALS — BP 146/82 | HR 65 | Temp 97.2°F | Ht 62.0 in | Wt 131.2 lb

## 2020-12-02 DIAGNOSIS — B0229 Other postherpetic nervous system involvement: Secondary | ICD-10-CM | POA: Diagnosis not present

## 2020-12-02 DIAGNOSIS — M542 Cervicalgia: Secondary | ICD-10-CM | POA: Diagnosis not present

## 2020-12-02 DIAGNOSIS — R519 Headache, unspecified: Secondary | ICD-10-CM | POA: Insufficient documentation

## 2020-12-02 MED ORDER — KETOROLAC TROMETHAMINE 30 MG/ML IJ SOLN
30.0000 mg | Freq: Once | INTRAMUSCULAR | Status: AC
  Administered 2020-12-02: 30 mg via INTRAVENOUS

## 2020-12-02 MED ORDER — TRAMADOL HCL 50 MG PO TABS: 50.0000 mg | ORAL_TABLET | Freq: Three times a day (TID) | ORAL | 0 refills | Status: DC | PRN

## 2020-12-02 NOTE — Assessment & Plan Note (Signed)
Suspect head/neck pain may have started with zoster (limited rash hidden in scalp) Now scalp is tender with several small erythematous bumps  Too far out for anti viral treatment  Discussed nature of this and expectations Px limited # of tramadol to help with severe pain  Lab for zoster IgM today  toradol 30 mg IM injection for pain relief

## 2020-12-02 NOTE — Assessment & Plan Note (Signed)
Suspect this was from shingles/now shingles neuralgia  Zoster IgM drawn  Pending result  tx with toradol 30 mg IM  Tramadol prn with caution of sedation and habit

## 2020-12-02 NOTE — Assessment & Plan Note (Signed)
Now with scalp pain and some resolving scalp lesions Suspect she had shingles   Reassuring exam Also some spasm-improved/better rom  Tramadol px  toradol 30 mg IM  Consider PT later if no further improvement  No UE symptoms

## 2020-12-02 NOTE — Progress Notes (Signed)
Subjective:    Patient ID: Jasmine Rollins, female    DOB: 30-Jul-1950, 70 y.o.   MRN: [id number]  This visit occurred during the SARS-CoV-2 public health emergency.  Safety protocols were in place, including screening questions prior to the visit, additional usage of staff PPE, and extensive cleaning of exam room while observing appropriate contact time as indicated for disinfecting solutions.   HPI Pt presents with head/neck pain   Going on for about a month    She saw Dr Ermalene Searing on 10/7 Given nsaid and muscle relaxer for neck strain /trapezius   She was seen on 10/16 in Urgent care  C/o neck pain and headache for a week -not improved with diclofenac and flexeril  Spasms and tenderness noted on exam with limited rom  Xray showed some degenerative disc changes Toradol IM given in office  Px lodine and tizanidine Adv heat/ice   DG Cervical Spine Complete  Result Date: 11/29/2020 CLINICAL DATA:  Neck pain and spasms EXAM: CERVICAL SPINE - COMPLETE 4+ VIEW COMPARISON:  None. FINDINGS: Normal alignment. Bones are osteopenic. Normal prevertebral soft tissues. Degenerative disc disease most severe at C5-6 with marked disc space narrowing, sclerosis and osteophyte formation. Additional degenerative changes at C6-7 and multilevel facet arthropathy noted posteriorly. Trachea midline.  Lung apices are clear. IMPRESSION: Lower cervical degenerative changes as above, most pronounced at C5-6. No acute finding or malalignment by plain radiography. Electronically Signed   By: Judie Petit.  Shick M.D.   On: 11/29/2020 10:50    Per pt : the toradol shot helped short term  Then started on lodine  Started some of the neck exercises   Keeps getting a bad headache-back of the head  Noted some red bumps on the L side  Skin is tender to the touch   Neck is better- can turn it   Patient Active Problem List   Diagnosis Date Noted   Post herpetic neuralgia 12/02/2020   Scalp pain 12/02/2020   Neck pain  11/20/2020   Insomnia 11/05/2020   Skin lesion 08/05/2020   Right knee pain 11/22/2019   Elevated TSH 12/14/2015   Vitamin D deficiency 12/06/2015   GERD (gastroesophageal reflux disease) 06/09/2014   Encounter for routine gynecological examination 03/03/2014   Estrogen deficiency 03/03/2014   Heartburn 03/01/2013   Gynecological examination 06/22/2010   Special screening for malignant neoplasms, colon 06/22/2010   Glaucoma suspect 06/22/2010   Routine general medical examination at a health care facility 06/03/2010   Sleep apnea    Osteoporosis 05/20/2009   SLEEP APNEA 10/17/2008   COUGH VARIANT ASTHMA 07/30/2008   Leukocytopenia 01/29/2008   Prediabetes 11/27/2007   HYPERCHOLESTEROLEMIA 03/21/2007   Essential hypertension 03/21/2007   Sinusitis, chronic 03/21/2007   Allergic rhinitis 03/21/2007   DIVERTICULOSIS, COLON 03/21/2007   Past Medical History:  Diagnosis Date   Allergic rhinitis    Arthritis    hip   Borderline high cholesterol    Cataract    Cough variant asthma    Diverticulosis of colon    GERD (gastroesophageal reflux disease)    HTN (hypertension)    Hyperglycemia    borderline DM   OP (osteoporosis)    Seasonal allergies    Sleep apnea    no cpap   Past Surgical History:  Procedure Laterality Date   COLONOSCOPY     COLPOSCOPY     ESOPHAGOGASTRODUODENOSCOPY  10/01   Negative   ROTATOR CUFF REPAIR     right   TUBAL LIGATION  Social History   Tobacco Use   Smoking status: Never   Smokeless tobacco: Never   Tobacco comments:    Remote 2nd hand exposure  Vaping Use   Vaping Use: Never used  Substance Use Topics   Alcohol use: No    Alcohol/week: 0.0 standard drinks   Drug use: No   Family History  Problem Relation Age of Onset   Hypertension Father    Diabetes Father    Diabetes Brother    Diabetes Brother    Cancer Brother        prostate   Cancer Brother        prostate   Cancer Brother        prostate   Cancer Brother         prostate   Cancer Brother        prostate   Cancer Brother        prostate   Breast cancer Maternal Aunt    Colon cancer Neg Hx    Allergies  Allergen Reactions   Augmentin [Amoxicillin-Pot Clavulanate]     GI upset    Sulfonamide Derivatives Rash   Current Outpatient Medications on File Prior to Visit  Medication Sig Dispense Refill   albuterol (PROVENTIL HFA) 108 (90 Base) MCG/ACT inhaler INHALE 2 PUFFS EVERY 4 HOURS AS NEEDED 18 g 3   alendronate (FOSAMAX) 70 MG tablet Take 1 tablet (70 mg total) by mouth every 7 (seven) days. Take with a full glass of water on an empty stomach. 12 tablet 3   amLODipine (NORVASC) 5 MG tablet Take 1 tablet (5 mg total) by mouth daily. 90 tablet 3   Calcium Carb-Cholecalciferol 600-800 MG-UNIT TABS Take by mouth.     etodolac (LODINE) 400 MG tablet Take 1 tablet (400 mg total) by mouth 2 (two) times daily. 30 tablet 0   fluticasone (FLONASE) 50 MCG/ACT nasal spray Place 2 sprays into both nostrils daily. 48 g 3   fluticasone (FLOVENT HFA) 44 MCG/ACT inhaler USE 2 INHALATIONS TWICE A DAY (RINSE MOUTH AFTER USE) 31.8 g 3   glucose blood (FREESTYLE LITE) test strip To check glucose once daily (dx. R73.03) 100 each 1   latanoprost (XALATAN) 0.005 % ophthalmic solution Place 1 drop into both eyes at bedtime.      montelukast (SINGULAIR) 10 MG tablet Take 1 tablet (10 mg total) by mouth at bedtime. 90 tablet 3   olmesartan-hydrochlorothiazide (BENICAR HCT) 40-12.5 MG tablet Take 1 tablet by mouth daily. 90 tablet 3   tiZANidine (ZANAFLEX) 4 MG tablet Take 1 tablet (4 mg total) by mouth every 6 (six) hours as needed for muscle spasms. 30 tablet 0   No current facility-administered medications on file prior to visit.     Review of Systems  Constitutional:  Negative for activity change, appetite change, fatigue, fever and unexpected weight change.  HENT:  Negative for congestion, ear pain, rhinorrhea, sinus pressure and sore throat.   Eyes:   Negative for pain, redness and visual disturbance.  Respiratory:  Negative for cough, shortness of breath and wheezing.   Cardiovascular:  Negative for chest pain and palpitations.  Gastrointestinal:  Negative for abdominal pain, blood in stool, constipation and diarrhea.  Endocrine: Negative for polydipsia and polyuria.  Genitourinary:  Negative for dysuria, frequency and urgency.  Musculoskeletal:  Positive for neck pain. Negative for arthralgias, back pain and myalgias.  Skin:  Negative for pallor and rash.  Allergic/Immunologic: Negative for environmental allergies.  Neurological:  Positive  for headaches. Negative for dizziness, syncope, facial asymmetry, weakness, light-headedness and numbness.  Hematological:  Negative for adenopathy. Does not bruise/bleed easily.  Psychiatric/Behavioral:  Negative for decreased concentration and dysphoric mood. The patient is not nervous/anxious.       Objective:   Physical Exam Constitutional:      General: She is not in acute distress.    Appearance: Normal appearance. She is normal weight. She is not ill-appearing.  HENT:     Right Ear: External ear normal.     Left Ear: External ear normal.     Nose: Nose normal.     Mouth/Throat:     Mouth: Mucous membranes are moist.     Pharynx: Oropharynx is clear. No oropharyngeal exudate or posterior oropharyngeal erythema.  Eyes:     General:        Right eye: No discharge.        Left eye: No discharge.     Extraocular Movements: Extraocular movements intact.     Conjunctiva/sclera: Conjunctivae normal.     Pupils: Pupils are equal, round, and reactive to light.  Neck:     Comments: Some L sided cervical muscle tenderness Nl rom with pain at endpoints of rotation  No crepitus   Some small shotty LN post cervical  Also L submandibular Musculoskeletal:     Cervical back: Neck supple. Tenderness present. No rigidity.  Lymphadenopathy:     Cervical: Cervical adenopathy present.  Skin:     General: Skin is warm and dry.     Coloration: Skin is not pale.     Findings: No bruising or erythema.     Comments: Several erythematous papules (cannot r/o vesicles) -noted better on picture than exam on L superior scalp (hidden by thick hair) Tender over entire L scalp and also L neck    Neurological:     Mental Status: She is alert.     Cranial Nerves: No cranial nerve deficit.     Sensory: No sensory deficit.     Coordination: Coordination normal.  Psychiatric:        Mood and Affect: Mood normal.          Assessment & Plan:   Problem List Items Addressed This Visit       Nervous and Auditory   Post herpetic neuralgia    Suspect head/neck pain may have started with zoster (limited rash hidden in scalp) Now scalp is tender with several small erythematous bumps  Too far out for anti viral treatment  Discussed nature of this and expectations Px limited # of tramadol to help with severe pain  Lab for zoster IgM today  toradol 30 mg IM injection for pain relief      Relevant Orders   Varicella zoster antibody, IgM     Other   Neck pain - Primary    Now with scalp pain and some resolving scalp lesions Suspect she had shingles   Reassuring exam Also some spasm-improved/better rom  Tramadol px  toradol 30 mg IM  Consider PT later if no further improvement  No UE symptoms      Relevant Orders   Varicella zoster antibody, IgM   Scalp pain    Suspect this was from shingles/now shingles neuralgia  Zoster IgM drawn  Pending result  tx with toradol 30 mg IM  Tramadol prn with caution of sedation and habit      Relevant Orders   Varicella zoster antibody, IgM

## 2020-12-02 NOTE — Patient Instructions (Addendum)
I think you had shingles of the head  That could have started everything   Watch the spots/rash I think it is getting better   Use heat or ice on neck if helpful   Continue current medicines  You can try tramadol (caution of sedation and constipation and habit) for more severe pain  Take it with food  Use a stool softener if needed as well   Toradol shot today  Drink lots of fluids Keep [place] posted  Lab today

## 2020-12-03 ENCOUNTER — Ambulatory Visit: Payer: Medicare Other | Admitting: Family Medicine

## 2020-12-04 ENCOUNTER — Telehealth: Payer: Self-pay | Admitting: Family Medicine

## 2020-12-04 NOTE — Telephone Encounter (Signed)
Labs are not back yet, once they are back we will call her, will also route to PCP so she is aware pt wants a phone call and not just a mychart message

## 2020-12-04 NOTE — Telephone Encounter (Signed)
Pt called wanting to know her lab results from Wednesday morning appt. Pt said to call her because she did not see it through [employer]

## 2020-12-06 LAB — VARICELLA ZOSTER ANTIBODY, IGM: Varicella Zoster Ab IgM: <=0.9 (ref <=0.90)

## 2020-12-06 NOTE — Telephone Encounter (Signed)
Results are in [place] and also routed to you

## 2020-12-07 ENCOUNTER — Encounter: Payer: Self-pay | Admitting: Family Medicine

## 2020-12-07 NOTE — Telephone Encounter (Signed)
Appt has been scheduled.

## 2020-12-07 NOTE — Telephone Encounter (Signed)
Pt viewed results and scheduled a f/u with PCP due to sxs still being present

## 2020-12-08 ENCOUNTER — Encounter: Payer: Self-pay | Admitting: Family Medicine

## 2020-12-08 ENCOUNTER — Ambulatory Visit (INDEPENDENT_AMBULATORY_CARE_PROVIDER_SITE_OTHER): Payer: Medicare Other | Admitting: Family Medicine

## 2020-12-08 ENCOUNTER — Other Ambulatory Visit: Payer: Self-pay

## 2020-12-08 VITALS — BP 134/76 | HR 89 | Temp 98.2°F | Resp 16 | Ht 62.0 in | Wt 134.0 lb

## 2020-12-08 DIAGNOSIS — R519 Headache, unspecified: Secondary | ICD-10-CM | POA: Diagnosis not present

## 2020-12-08 DIAGNOSIS — G4452 New daily persistent headache (NDPH): Secondary | ICD-10-CM | POA: Diagnosis not present

## 2020-12-08 DIAGNOSIS — M542 Cervicalgia: Secondary | ICD-10-CM

## 2020-12-08 MED ORDER — TIZANIDINE HCL 4 MG PO TABS: 4.0000 mg | ORAL_TABLET | Freq: Four times a day (QID) | ORAL | 1 refills | Status: DC | PRN

## 2020-12-08 NOTE — Patient Instructions (Addendum)
You will get a call to schedule a CT scan   Once that is back we can consider some PT  Also orthopedic attention   If symptoms worsen or change let me know  If suddenly severe let me know

## 2020-12-08 NOTE — Assessment & Plan Note (Signed)
Less painful Some sensitivity  Zoster is still in the differential

## 2020-12-08 NOTE — Assessment & Plan Note (Addendum)
In conjunction with L neck pain and prior rash , zoster is in the differential  IgM for zoster was negative but may have been too late in the course Nl exam  Given new headache over age of 70, CT of head ordered w/o contrast Pending result  Disc neuro symptoms to watch for  Continue muscle relaxer prn Options in the future may include gabapentin and prednisone

## 2020-12-08 NOTE — Assessment & Plan Note (Signed)
Ongoing /worse on L side  Still suspect that she may have had shingles but IgM test for zoster is negative (may have been too late in the course) Disc stretches With headache (worse) will plan CT head  lodine prn  Consider PT once CT is done Also consider ortho ref

## 2020-12-08 NOTE — Progress Notes (Signed)
Subjective:    Patient ID: Jasmine Rollins, female    DOB: [birth year], 70 y.o.   MRN: [id number]  This visit occurred during the SARS-CoV-2 public health emergency.  Safety protocols were in place, including screening questions prior to the visit, additional usage of staff PPE, and extensive cleaning of exam room while observing appropriate contact time as indicated for disinfecting solutions.   HPI Pt presents with continued neck pain and headache  Wt Readings from Last 3 Encounters:  12/08/20 134 lb (60.8 kg)  12/02/20 131 lb 4 oz (59.5 kg)  11/20/20 130 lb (59 kg)   24.51 kg/m  Still has a head ache  Also L neck pain   Headache  Scalp did itch and then calmed down , but still feels sensitive to the touch   Headache -worse when tired and stressed   (in the beginning was the top of her head)  Also gets it when she does her exercises  Pain is dull  Constant (not throbbing)  No vision change  No hearing change  No tinnitus  No facial droop or eye twitch or other facial symptoms  Keeps her from sleeping   Cannot get comfortable with her neck  Hurts to extend her neck and also to go R   No more rash  No spots at all   No n/v or dizziness    Still trying to do neck therapy   Last visit ? Whether she may have post herpetic neuralgia   Last visit given toradol and tramadol  Tramadol made her feel sick  Went back to lodine -takes with food and it helps   Zoster IgM was negative   Patient Active Problem List   Diagnosis Date Noted   Headache 12/08/2020   Post herpetic neuralgia 12/02/2020   Scalp pain 12/02/2020   Neck pain 11/20/2020   Insomnia 11/05/2020   Skin lesion 08/05/2020   Right knee pain 11/22/2019   Elevated TSH 12/14/2015   Vitamin D deficiency 12/06/2015   GERD (gastroesophageal reflux disease) 06/09/2014   Encounter for routine gynecological examination 03/03/2014   Estrogen deficiency 03/03/2014   Heartburn 03/01/2013   Gynecological  examination 06/22/2010   Special screening for malignant neoplasms, colon 06/22/2010   Glaucoma suspect 06/22/2010   Routine general medical examination at a health care facility 06/03/2010   Sleep apnea    Osteoporosis 05/20/2009   SLEEP APNEA 10/17/2008   COUGH VARIANT ASTHMA 07/30/2008   Leukocytopenia 01/29/2008   Prediabetes 11/27/2007   HYPERCHOLESTEROLEMIA 03/21/2007   Essential hypertension 03/21/2007   Sinusitis, chronic 03/21/2007   Allergic rhinitis 03/21/2007   DIVERTICULOSIS, COLON 03/21/2007   Past Medical History:  Diagnosis Date   Allergic rhinitis    Arthritis    hip   Borderline high cholesterol    Cataract    Cough variant asthma    Diverticulosis of colon    GERD (gastroesophageal reflux disease)    HTN (hypertension)    Hyperglycemia    borderline DM   OP (osteoporosis)    Seasonal allergies    Sleep apnea    no cpap   Past Surgical History:  Procedure Laterality Date   COLONOSCOPY     COLPOSCOPY     ESOPHAGOGASTRODUODENOSCOPY  10/01   Negative   ROTATOR CUFF REPAIR     right   TUBAL LIGATION     Social History   Tobacco Use   Smoking status: Never   Smokeless tobacco: Never   Tobacco comments:  Remote 2nd hand exposure  Vaping Use   Vaping Use: Never used  Substance Use Topics   Alcohol use: No    Alcohol/week: 0.0 standard drinks   Drug use: No   Family History  Problem Relation Age of Onset   Hypertension Father    Diabetes Father    Diabetes Brother    Diabetes Brother    Cancer Brother        prostate   Cancer Brother        prostate   Cancer Brother        prostate   Cancer Brother        prostate   Cancer Brother        prostate   Cancer Brother        prostate   Breast cancer Maternal Aunt    Colon cancer Neg Hx    Allergies  Allergen Reactions   Augmentin [Amoxicillin-Pot Clavulanate]     GI upset    Sulfonamide Derivatives Rash   Current Outpatient Medications on File Prior to Visit  Medication Sig  Dispense Refill   albuterol (PROVENTIL HFA) 108 (90 Base) MCG/ACT inhaler INHALE 2 PUFFS EVERY 4 HOURS AS NEEDED 18 g 3   alendronate (FOSAMAX) 70 MG tablet Take 1 tablet (70 mg total) by mouth every 7 (seven) days. Take with a full glass of water on an empty stomach. 12 tablet 3   amLODipine (NORVASC) 5 MG tablet Take 1 tablet (5 mg total) by mouth daily. 90 tablet 3   Calcium Carb-Cholecalciferol 600-800 MG-UNIT TABS Take by mouth.     etodolac (LODINE) 400 MG tablet Take 1 tablet (400 mg total) by mouth 2 (two) times daily. 30 tablet 0   fluticasone (FLONASE) 50 MCG/ACT nasal spray Place 2 sprays into both nostrils daily. 48 g 3   fluticasone (FLOVENT HFA) 44 MCG/ACT inhaler USE 2 INHALATIONS TWICE A DAY (RINSE MOUTH AFTER USE) 31.8 g 3   glucose blood (FREESTYLE LITE) test strip To check glucose once daily (dx. R73.03) 100 each 1   latanoprost (XALATAN) 0.005 % ophthalmic solution Place 1 drop into both eyes at bedtime.      montelukast (SINGULAIR) 10 MG tablet Take 1 tablet (10 mg total) by mouth at bedtime. 90 tablet 3   olmesartan-hydrochlorothiazide (BENICAR HCT) 40-12.5 MG tablet Take 1 tablet by mouth daily. 90 tablet 3   traMADol (ULTRAM) 50 MG tablet Take 1 tablet (50 mg total) by mouth every 8 (eight) hours as needed for severe pain. Caution of sedation 15 tablet 0   No current facility-administered medications on file prior to visit.     Review of Systems  Constitutional:  Negative for activity change, appetite change, fatigue, fever and unexpected weight change.  HENT:  Negative for congestion, ear pain, rhinorrhea, sinus pressure and sore throat.   Eyes:  Negative for pain, redness and visual disturbance.  Respiratory:  Negative for cough, shortness of breath and wheezing.   Cardiovascular:  Negative for chest pain and palpitations.  Gastrointestinal:  Negative for abdominal pain, blood in stool, constipation and diarrhea.  Endocrine: Negative for polydipsia and polyuria.   Genitourinary:  Negative for dysuria, frequency and urgency.  Musculoskeletal:  Positive for neck pain. Negative for arthralgias, back pain, myalgias and neck stiffness.  Skin:  Negative for pallor.  Allergic/Immunologic: Negative for environmental allergies.  Neurological:  Positive for headaches. Negative for dizziness, tremors, syncope, facial asymmetry, weakness, light-headedness and numbness.  Hematological:  Negative for adenopathy. Does  not bruise/bleed easily.  Psychiatric/Behavioral:  Negative for decreased concentration and dysphoric mood. The patient is not nervous/anxious.       Objective:   Physical Exam Constitutional:      General: She is not in acute distress.    Appearance: Normal appearance. She is well-developed and normal weight. She is not ill-appearing or diaphoretic.  HENT:     Head: Normocephalic and atraumatic.     Comments: No sinus tenderness    Right Ear: External ear normal.     Left Ear: External ear normal.     Nose: Nose normal.     Mouth/Throat:     Mouth: Mucous membranes are moist.     Pharynx: No oropharyngeal exudate.  Eyes:     General: No scleral icterus.       Right eye: No discharge.        Left eye: No discharge.     Conjunctiva/sclera: Conjunctivae normal.     Pupils: Pupils are equal, round, and reactive to light.     Comments: No nystagmus  Neck:     Thyroid: No thyromegaly.     Vascular: No carotid bruit or JVD.     Trachea: No tracheal deviation.     Comments: Tender L cervical musculature  Pain to fully flex and rotate left Cardiovascular:     Rate and Rhythm: Normal rate and regular rhythm.     Heart sounds: Normal heart sounds. No murmur heard. Pulmonary:     Effort: Pulmonary effort is normal. No respiratory distress.     Breath sounds: Normal breath sounds. No wheezing or rales.  Abdominal:     General: Bowel sounds are normal. There is no distension.     Palpations: Abdomen is soft. There is no mass.     Tenderness:  There is no abdominal tenderness.  Musculoskeletal:        General: No tenderness.     Cervical back: Full passive range of motion without pain, normal range of motion and neck supple.     Right lower leg: No edema.     Left lower leg: No edema.  Lymphadenopathy:     Cervical: No cervical adenopathy.  Skin:    General: Skin is warm and dry.     Coloration: Skin is not pale.     Findings: No erythema or rash.  Neurological:     Mental Status: She is alert and oriented to person, place, and time.     Cranial Nerves: Cranial nerves 2-12 are intact. No cranial nerve deficit, dysarthria or facial asymmetry.     Sensory: Sensation is intact. No sensory deficit.     Motor: No weakness, tremor, atrophy, abnormal muscle tone or pronator drift.     Coordination: Coordination is intact. Romberg sign negative. Coordination normal.     Gait: Gait is intact. Gait normal.     Deep Tendon Reflexes: Reflexes are normal and symmetric. Reflexes normal.     Comments: No focal cerebellar signs   Psychiatric:        Mood and Affect: Mood normal.        Behavior: Behavior normal.        Thought Content: Thought content normal.          Assessment & Plan:   Problem List Items Addressed This Visit       Other   Neck pain - Primary    Ongoing /worse on L side  Still suspect that she may have had shingles  but IgM test for zoster is negative (may have been too late in the course) Disc stretches With headache (worse) will plan CT head  lodine prn  Consider PT once CT is done Also consider ortho ref       Scalp pain    Less painful Some sensitivity  Zoster is still in the differential      Headache    In conjunction with L neck pain and prior rash , zoster is in the differential  IgM for zoster was negative but may have been too late in the course Nl exam  Given new headache over age of 15, CT of head ordered w/o contrast Pending result  Disc neuro symptoms to watch for  Continue  muscle relaxer prn Options in the future may include gabapentin and prednisone       Relevant Medications   tiZANidine (ZANAFLEX) 4 MG tablet   Other Relevant Orders   CT HEAD WO CONTRAST ( )

## 2020-12-09 ENCOUNTER — Ambulatory Visit (INDEPENDENT_AMBULATORY_CARE_PROVIDER_SITE_OTHER)
Admission: RE | Admit: 2020-12-09 | Discharge: 2020-12-09 | Disposition: A | Discharge status: Home / Self Care | Payer: Medicare Other | Source: Ambulatory Visit | Attending: Family Medicine | Admitting: Family Medicine

## 2020-12-09 DIAGNOSIS — G4452 New daily persistent headache (NDPH): Secondary | ICD-10-CM | POA: Diagnosis not present

## 2020-12-09 DIAGNOSIS — R519 Headache, unspecified: Secondary | ICD-10-CM | POA: Diagnosis not present

## 2020-12-11 ENCOUNTER — Encounter: Payer: Self-pay | Admitting: Family Medicine

## 2020-12-11 NOTE — Telephone Encounter (Signed)
See result note/phone note, sent to Dr tower with this information

## 2020-12-12 ENCOUNTER — Telehealth: Payer: Self-pay | Admitting: Family Medicine

## 2020-12-12 ENCOUNTER — Encounter: Payer: Self-pay | Admitting: Family Medicine

## 2020-12-12 MED ORDER — GABAPENTIN 100 MG PO CAPS: 100.0000 mg | ORAL_CAPSULE | Freq: Two times a day (BID) | ORAL | 3 refills | Status: DC

## 2020-12-12 NOTE — Telephone Encounter (Signed)
-----   Message from [facility], [place] sent at 12/11/2020 12:11 PM EDT ----- This was the reply from patient through mychart message "Good morning Dr Milinda Antis, concerning my CT results thank you for getting back to me and right now I still have a very itchy scalp but I dare not scratch because that is very painful so until that clears, I don't really want to go to PT. The pain in my neck has subsided and I can turn it fairly well but for future reference I have gone to the [facility] in [place] and Dr Yisroel Ramming is the one I have seen numerous times, also his assistant. And you can go ahead and prescribe the Gabapentin but hopefully I won't need to take it often

## 2020-12-18 ENCOUNTER — Telehealth: Payer: Self-pay

## 2020-12-18 ENCOUNTER — Encounter: Payer: Self-pay | Admitting: Family Medicine

## 2020-12-18 NOTE — Chronic Care Management (AMB) (Addendum)
Chronic Care Management Pharmacy Assistant   Name: EDELYN HEIDEL  MRN: [id number] DOB: January 09, 1951  Reason for Encounter: General Disease State  Recent office visits:  12/08/20-PCP-Patient presented for follow up head and neck pain,scalp itching.Order CT-Scan head, consider PT.Options in the future may include gabapentin and prednisone 12/02/20-PCP-Patient presented for head/neck pain.Red bumps on scalp,labs for zoster,short course of tramadol for severe pain,Toradol 30mg  IM injection. 11/20/20-Family Medicine-Patient presented for head/neck pain.Start anti-inflammatory diclofenac ( in place of Advil) twice daily on full stomach.Can try muscle relaxant at bedtime. Use heat, do stretching  Recent consult visits:  None since last CCM contact  Hospital visits:  11/29/20-[facility]- Patient presented for evaluation of head/neck pain.Xrays ordered,Toradol 30mg  IM injection-We will change medications from the diclofenac and Flexeril to Lodine twice a day and tizanidine as needed.May use heat, ice, and OTC pain relievers as needed for comfort.  Follow-up with orthopedics and PCP for reevaluation and further management of neck pain. No admisson.  Medications: Outpatient Encounter Medications as of 12/18/2020  Medication Sig   albuterol (PROVENTIL HFA) 108 (90 Base) MCG/ACT inhaler INHALE 2 PUFFS EVERY 4 HOURS AS NEEDED   alendronate (FOSAMAX) 70 MG tablet Take 1 tablet (70 mg total) by mouth every 7 (seven) days. Take with a full glass of water on an empty stomach.   amLODipine (NORVASC) 5 MG tablet Take 1 tablet (5 mg total) by mouth daily.   Calcium Carb-Cholecalciferol 600-800 MG-UNIT TABS Take by mouth.   etodolac (LODINE) 400 MG tablet Take 1 tablet (400 mg total) by mouth 2 (two) times daily.   fluticasone (FLONASE) 50 MCG/ACT nasal spray Place 2 sprays into both nostrils daily.   fluticasone (FLOVENT HFA) 44 MCG/ACT inhaler USE 2 INHALATIONS TWICE A DAY (RINSE MOUTH AFTER USE)    gabapentin (NEURONTIN) 100 MG capsule Take 1 capsule (100 mg total) by mouth 2 (two) times daily. Caution of sedation   glucose blood (FREESTYLE LITE) test strip To check glucose once daily (dx. R73.03)   latanoprost (XALATAN) 0.005 % ophthalmic solution Place 1 drop into both eyes at bedtime.    montelukast (SINGULAIR) 10 MG tablet Take 1 tablet (10 mg total) by mouth at bedtime.   olmesartan-hydrochlorothiazide (BENICAR HCT) 40-12.5 MG tablet Take 1 tablet by mouth daily.   tiZANidine (ZANAFLEX) 4 MG tablet Take 1 tablet (4 mg total) by mouth every 6 (six) hours as needed for muscle spasms.   traMADol (ULTRAM) 50 MG tablet Take 1 tablet (50 mg total) by mouth every 8 (eight) hours as needed for severe pain. Caution of sedation   No facility-administered encounter medications on file as of 12/18/2020.       Contacted Phillip E Fuller on 12/18/20 for general disease state and medication adherence call.   Patient is not > 5 days past due for refill on the following medications per chart history:  [facility] Medications: Medication Name/mg Last Fill Days Supply Olmesartan 40-12.5mg            11/05/20            90  What concerns do you have about your medications? The patient reports no concerns regarding cholesterol. State her diet is good.  The patient denies side effects with her medications. The patient states she is tolerating the Fosamax. She did question what side effects of the Fosamax could be like neck pain and head pain. She has some itching of scalp. She will follow up with PCP regarding this.  How often  do you forget or accidentally miss a dose? Never  Do you use a pillbox? No The patient puts her bottles together in a box.  Are you having any problems getting your medications from your pharmacy? No  Has the cost of your medications been a concern? No  Since last visit with CPP, no interventions have been made.  The patient has not had an ED visit since last contact. Urgent Care  11/29/20-Toradol injection  The patient reports the following problems with their health. She continues to have some rash breakouts on her head and body, stiffness in neck, she is itching. The patient continues to complain of pain. Advised the patient to contact her PCP through patient portal.  she denies  concerns or questions for Phil Dopp, PharmD at this time.   Counseled patient on:  Great job taking medications and Access to [facility] team for any cost, medication or pharmacy concerns.   Care Gaps: Annual wellness visit in last year? Yes Most Recent BP reading:134/76  89-P  12/08/20  No appointments scheduled within the next 30 days.   Phil Dopp, CPP notified  Burt Knack, [facility] Clincal Pharmacy Assistant [phone]  I have reviewed the care management and care coordination activities outlined in this encounter and I am certifying that I agree with the content of this note. No further action required.  Phil Dopp, PharmD Clinical Pharmacist [facility] [phone]

## 2020-12-21 MED ORDER — MUPIROCIN CALCIUM 2 % EX CREA: 1.0000 "application " | TOPICAL_CREAM | Freq: Two times a day (BID) | CUTANEOUS | 1 refills | Status: DC

## 2020-12-21 NOTE — Telephone Encounter (Signed)
Asked pt to f/u in 1-2 wk for check

## 2020-12-22 ENCOUNTER — Other Ambulatory Visit: Payer: Self-pay | Admitting: Family Medicine

## 2020-12-22 DIAGNOSIS — M542 Cervicalgia: Secondary | ICD-10-CM | POA: Diagnosis not present

## 2020-12-22 DIAGNOSIS — M4692 Unspecified inflammatory spondylopathy, cervical region: Secondary | ICD-10-CM | POA: Diagnosis not present

## 2020-12-22 NOTE — Telephone Encounter (Signed)
Refill request Tizanidine Last refill 12/08/20 #30/1 Last office visit 12/08/20

## 2020-12-28 ENCOUNTER — Encounter: Payer: Self-pay | Admitting: Physical Therapy

## 2020-12-28 ENCOUNTER — Ambulatory Visit: Payer: Medicare Other | Attending: Family Medicine | Admitting: Physical Therapy

## 2020-12-28 DIAGNOSIS — M542 Cervicalgia: Secondary | ICD-10-CM | POA: Insufficient documentation

## 2020-12-28 DIAGNOSIS — R519 Headache, unspecified: Secondary | ICD-10-CM | POA: Diagnosis not present

## 2020-12-28 DIAGNOSIS — M62838 Other muscle spasm: Secondary | ICD-10-CM | POA: Insufficient documentation

## 2020-12-28 NOTE — Therapy (Signed)
[facility] PHYSICAL AND SPORTS MEDICINE [address] [place], [place], [address] Phone: [phone]   Fax:  [phone]  Physical Therapy Evaluation  Patient Details  Name: Jasmine Rollins MRN: [id number] Date of Birth: 1950-06-25 Referring Provider (PT): Eula Listen, MD (Orthopedics)  Encounter Date: 12/28/2020   PT End of Session - 12/28/20 1454     Visit Number 1    Number of Visits 24    Date for PT Re-Evaluation 03/22/21    Authorization Type [employer] MEDICARE reporting period from 12/28/2020    Progress Note Due on Visit 10    PT Start Time 1345    PT Stop Time 1430    PT Time Calculation (min) 45 min    Activity Tolerance Patient tolerated treatment well    Behavior During Therapy [facility] for tasks assessed/performed             Past Medical History:  Diagnosis Date   Allergic rhinitis    Arthritis    hip   Borderline high cholesterol    Cataract    Cough variant asthma    Diverticulosis of colon    GERD (gastroesophageal reflux disease)    HTN (hypertension)    Hyperglycemia    borderline DM   OP (osteoporosis)    Seasonal allergies    Sleep apnea    no cpap    Past Surgical History:  Procedure Laterality Date   COLONOSCOPY     COLPOSCOPY     ESOPHAGOGASTRODUODENOSCOPY  10/01   Negative   ROTATOR CUFF REPAIR     right   TUBAL LIGATION      There were no vitals filed for this visit.    Subjective Assessment - 12/28/20 1358     Subjective Patient states her neck pain has been going on for about 5-6 weeks. She got up one morning and it felt like she had a "crook" in her neck and she didn't have a lot of rotation. She thought it would get better but it didn't. She went to her doctor who said she had knots in her traps and that they were swollen. It was worse on the left. Her doctor gave her exercises that included AROM rotation and it seemed to make it worse so she went back to her doctor. She also had what her Dr  thought was shingles on the left side. She had blisters there and it was hurting a lot. She had both shingles vaccines and her dr said it would have been worse if she didn't get that. She went to her orthopedist who thought it would be hard to say if the shingles and neck pain were related or not. They did start close to the same time. She states her orthopedist gave her a dosepak of prednisone and it is helping. She has better rotation now and is still taking prednisone. She was taking some pain medication but found the side effects too much with prednisone. Her orthopedist also referred to PT. The blisters from shingles came about 1 month ago. She was given an antibiotic for the blisters that and that is helping. They are dried up and she doesn't feel them any longer. She only had 3 blisters in her hair. Denies history of MVA or prior neck problems. Describes pain as over left upper trap, occasionally over to right mid neck, and headache on left side of top of head. Does not remember headaches before shingles symptoms. Denies UE symptoms.  Pertinent History Patient is a 70 y.o. female who presents to outpatient physical therapy with a referral for medical diagnosis neck pain and stiffness. This patient's chief complaints consist of cervical spine pain and stiffness, L > R, and head pain leading to the following functional deficits: difficulty with walking for exercise, driving, turning head to view surroundings and be safe, stationary bike, looking up, looking down, reading, grooming, doing hair, lifting, sleeping.  Relevant past medical history and comorbidities include recent shingles, HTN, sleep apnea (untreated), GERD, asthma if she coughs, osteoporosis, diverticulitis (asymptomatic), suspect glaucoma, hx right knee pain, hx R RTC repair.  Patient denies hx of cancer, stroke, seizures, lung problem, major cardiac events, unexplained weight loss, changes in bowel or bladder problems, new onset stumbling or  dropping things, spinal surgery.    Limitations Reading;Lifting;Walking;House hold activities;Sitting   walking, driving, turning head to view surroundings and be safe, stationary bike, looking up, looking down, reading, grooming, doing hair, lifting, sleeping.   Diagnostic tests cervical spine radiograph 11/29/2020: "FINDINGS:  Normal alignment. Bones are osteopenic. Normal prevertebral soft  tissues. Degenerative disc disease most severe at C5-6 with marked  disc space narrowing, sclerosis and osteophyte formation. Additional  degenerative changes at C6-7 and multilevel facet arthropathy noted  posteriorly.  Trachea midline.  Lung apices are clear.  IMPRESSION:  Lower cervical degenerative changes as above, most pronounced at  C5-6.  No acute finding or malalignment by plain radiography."    Patient Stated Goals "to have no pain in my neck" "to be able to move it like I always used to"    Currently in Pain? Yes    Pain Score 0-No pain   W: 10/10; B: 0/10 (keeping head still). Currently it goes up to 5/10 when she turns head.   Pain Location Neck   over left upper trap, occasionally over to right mid neck, and headache on left side of top of head.   Pain Orientation Left    Pain Descriptors / Indicators Sore   not burning or electrical   Pain Type Acute pain    Pain Radiating Towards over left upper trap, occasionally over to right mid neck, and headache on left side of top of head.    Pain Onset More than a month ago    Pain Frequency Intermittent    Aggravating Factors  turning head, end range head movements, laying down and trying to get comfortable.    Pain Relieving Factors prednisone, moist heat, medicatoins like gabapentin, doterra wraps    Effect of Pain on Daily Activities Functional Limitations: walking for exercise, driving, turning head to view surroundings and be safe, stationary bike, looking up, looking down, reading, grooming, doing hair, lifting, sleeping, housework/cleaning.                 [facility] PT Assessment - 12/28/20 1343       Assessment   Medical Diagnosis neck pain and stiffness    Referring Provider (PT) Eula Listen, MD (Orthopedics)    Onset Date/Surgical Date 11/23/20    Hand Dominance Right    Prior Therapy prior for knee and maybe for neck but unsure      Precautions   Precautions None      Balance Screen   Has the patient fallen in the past 6 months No    Has the patient had a decrease in activity level because of a fear of falling?  No    Is the patient reluctant to leave their home  because of a fear of falling?  No      Home Environment   Living Environment --   no concerns about getting around home safely     Prior Function   Level of Independence Independent    Leisure walking, stationary bike, yoga      Cognition   Overall Cognitive Status Within Functional Limits for tasks assessed             OBJECTIVE  SELF- REPORTED FUNCTION FOTO score: 42/100 (neck questionnaire)  OBSERVATION/INSPECTION Posture Posture (seated): forward head, rounded shoulders, increased kyphosis at CT junction Anthropometrics Tremor: none Body composition: BMI 23.8 Muscle bulk: no gross asymetry Skin: skin appears WNL where visualized. Did not inspect known shingles lesions on head.  Functional Mobility Bed mobility: supine <> sit mod I for increased time/effort Transfers: sit <> stand I Gait: grossly WFL for household and short community ambulation. More detailed gait analysis deferred to later date as needed.   NEUROLOGICAL Dermatomes C2-C5  appears equal and intact to light touch (other dermatomes not tested).   SPINE MOTION Cervical Spine AROM *Indicates pain Flexion: 25 left neck pain limiting  Extension: 45 left pain limiting Side Flexion:   R 18 pain in left UT  L 22 pain in base of left neck Rotation:  R 45 pain at base of left neck L 30 pain at base of left neck  Cervical Spine PROM Supine rotation limited  bilaterally L > R with guarded end feel  PERIPHERAL JOINT MOTION (in degrees) Active Range of Motion (AROM) B UE AROM WFL  MUSCLE PERFORMANCE (MMT):  Cervical Spine Flexion: 4/5 pain at back of neck Extension: 5/5 Side Flexion:   R 3+/5 limited by right sided pain  L 3+/5 limited by right sided pain Rotation:  R 5/5 L 5/5  ACCESSORY MOTION:  No reproduction of symptoms with supine CPA Feels good with manual traction  PALPATION: TTP grade III/II at bilateral UT (L>R), bilateral suboccipital muscles TTP grade I/II at bilateral neck paraspinals and scalenes.  Not TTP at scalp superior to L ear.   Objective measurements completed on examination: See above findings.   TREATMENT:  Therapeutic exercise: to centralize symptoms and improve ROM, strength, muscular endurance, and activity tolerance required for successful completion of functional activities.  - Education on diagnosis, prognosis, POC, anatomy and physiology of current condition.  - supine chin tuck, 5 second hold, 1x10 - Education on HEP including handout   Manual therapy: to reduce pain and tissue tension, improve range of motion, neuromodulation, in order to promote improved ability to complete functional activities. SUPINE - STM to posterior cervical spine musculature including bilateral upper traps, cervical paraspinals, scalenes, suboccipital muscles - gentle intermittent manual cervical spine traction  Pt required multimodal cuing for proper technique and to facilitate improved neuromuscular control, strength, range of motion, and functional ability resulting in improved performance and form.  Patient reports feeling better after exam and treatment.   HOME EXERCISE PROGRAM Access Code: [email or web address] URL: [email or web address] Date: 12/28/2020 Prepared by: Norton Blizzard  Exercises Supine Chin Tuck - 2 x daily - 2 sets - 10 reps - 5 hold     PT Education - 12/28/20 1454     Education Details  Exercise purpose/form. Self management techniques. Education on diagnosis, prognosis, POC, anatomy and physiology of current condition Education on HEP including handout    Person(s) Educated Patient    Methods Explanation;Demonstration;Tactile cues;Verbal cues;Handout    Comprehension Verbalized understanding;Returned demonstration;Verbal cues required;Tactile cues  required;Need further instruction              PT Short Term Goals - 12/28/20 1455       PT SHORT TERM GOAL #1   Title Be independent with initial home exercise program for self-management of symptoms.    Baseline initial HEP provided at IE (12/28/2020);    Time 2    Period Weeks    Status New    Target Date 01/11/21               PT Long Term Goals - 12/28/20 1456       PT LONG TERM GOAL #1   Title Be independent with a long-term home exercise program for self-management of symptoms.    Baseline initial HEP provided at IE (12/28/2020);    Time 12    Period Weeks    Status New   TARGET DATE FOR ALL LONG TERM GOALS: 03/22/2021     PT LONG TERM GOAL #2   Title Demonstrate improved FOTO to equal or greater than 58 by visit #12 to demonstrate improvement in overall condition and self-reported functional ability.    Baseline 42 (12/28/2020);    Time 12    Period Weeks    Status New      PT LONG TERM GOAL #3   Title Reduce pain with functional activities to equal or less than 1/10 to allow patient to complete usual activities including housework, walking, turning head with less difficulty.    Baseline 5/10 (12/28/2020);    Time 12    Period Weeks    Status New      PT LONG TERM GOAL #4   Title Patient will demonstrate cervical spine AROM rotation to equal or greater than 60 degrees each way to improve ability to check blind spot while driving and view surroundings.    Baseline R 45 pain at base of left neck  L 30 pain at base of left neck (12/28/2020);    Time 12    Period Weeks    Status New      PT  LONG TERM GOAL #5   Title Complete community, work and/or recreational activities without limitation due to current condition.    Baseline Functional Limitations: walking for exercise, driving, turning head to view surroundings and be safe, stationary bike, looking up, looking down, reading, grooming, doing hair, lifting, sleeping, housework/cleaning (12/28/2020);    Time 12    Period Weeks    Status New                    Plan - 12/28/20 1504     Clinical Impression Statement Patient is a 70 y.o. female referred to outpatient physical therapy with a medical diagnosis of neck pain and stiffness who presents with signs and symptoms consistent with sub-acute neck pain/stiffness with radiation to the head L > R in the setting of recent dx of shingles in the region. Unclear if shingles is the primary initial driver of pain and stiffness but objective exam reveals significant tenderness and tension in bilateral posterior cervical spine musculature, especially B UT and suboccipital muscles consistent with guarding that would benefit from PT intervention to help restore normal function. Unable to reproduce referral to head this exam but will continue to monitor. Patient presents with significant pain, ROM, joint stiffness, muscle tension, muscle performance (strength/power/endurance), motor control, posture, and activity tolerance impairments that are limiting ability to complete her usual activities including walking for exercise, driving,  turning head to view surroundings and be safe, stationary bike, looking up, looking down, reading, grooming, doing hair, lifting, sleeping, housework/cleaning without difficulty. Patient will benefit from skilled physical therapy intervention to address current body structure impairments and activity limitations to improve function and work towards goals set in current POC in order to return to prior level of function or maximal functional improvement.    Personal  Factors and Comorbidities Age;Comorbidity 3+;Past/Current Experience;Time since onset of injury/illness/exacerbation    Comorbidities Relevant past medical history and comorbidities include recent shingles, HTN, sleep apnea (untreated), GERD, asthma if she coughs, osteoporosis, diverticulitis (asymptomatic), suspect glaucoma, hx right knee pain, hx R RTC repair.    Examination-Activity Limitations Bathing;Hygiene/Grooming;Lift;Caring for Others;Carry;Sleep    Examination-Participation Restrictions Driving;Community Activity;Laundry;Cleaning;Interpersonal Relationship;Other   walking for exercise, driving, turning head to view surroundings and be safe, stationary bike, looking up, looking down, reading, grooming, doing hair, lifting, sleeping, housework/cleaning.   Stability/Clinical Decision Making Evolving/Moderate complexity    Clinical Decision Making Moderate    Rehab Potential Good    PT Frequency 2x / week    PT Duration 12 weeks    PT Treatment/Interventions ADLs/Self Care Home Management;Cryotherapy;Moist Heat;Therapeutic activities;Therapeutic exercise;Neuromuscular re-education;Manual techniques;Dry needling;Passive range of motion;Patient/family education    PT Next Visit Plan update HEP as appropriate, manual therapy, graded motor control, strengthening, and stretching exercises as tolerated.    PT Home Exercise Plan Medbridge Access Code: [email or web address]    Consulted and Agree with Plan of Care Patient             Patient will benefit from skilled therapeutic intervention in order to improve the following deficits and impairments:  Decreased skin integrity, Pain, Improper body mechanics, Postural dysfunction, Increased muscle spasms, Decreased activity tolerance, Decreased endurance, Decreased range of motion, Decreased strength, Hypomobility, Impaired perceived functional ability, Impaired flexibility  Visit Diagnosis: Cervicalgia  Other muscle spasm  Nonintractable headache,  unspecified chronicity pattern, unspecified headache type     Problem List Patient Active Problem List   Diagnosis Date Noted   Headache 12/08/2020   Post herpetic neuralgia 12/02/2020   Scalp pain 12/02/2020   Neck pain 11/20/2020   Insomnia 11/05/2020   Skin lesion 08/05/2020   Right knee pain 11/22/2019   Elevated TSH 12/14/2015   Vitamin D deficiency 12/06/2015   GERD (gastroesophageal reflux disease) 06/09/2014   Encounter for routine gynecological examination 03/03/2014   Estrogen deficiency 03/03/2014   Heartburn 03/01/2013   Gynecological examination 06/22/2010   Special screening for malignant neoplasms, colon 06/22/2010   Glaucoma suspect 06/22/2010   Routine general medical examination at a health care facility 06/03/2010   Sleep apnea    Osteoporosis 05/20/2009   SLEEP APNEA 10/17/2008   COUGH VARIANT ASTHMA 07/30/2008   Leukocytopenia 01/29/2008   Prediabetes 11/27/2007   HYPERCHOLESTEROLEMIA 03/21/2007   Essential hypertension 03/21/2007   Sinusitis, chronic 03/21/2007   Allergic rhinitis 03/21/2007   DIVERTICULOSIS, COLON 03/21/2007    Jasmine Rollins. Jasmine Rollins, PT, DPT 12/28/20, 3:08 PM   [facility] PHYSICAL AND SPORTS MEDICINE [address], [place], [address] Phone: [phone]   Fax:  [phone]  Name: Jasmine Rollins MRN: [id number] Date of Birth: Nov 19, 1950

## 2020-12-30 ENCOUNTER — Ambulatory Visit: Payer: Medicare Other | Admitting: Physical Therapy

## 2020-12-30 ENCOUNTER — Encounter: Payer: Self-pay | Admitting: Physical Therapy

## 2020-12-30 DIAGNOSIS — R519 Headache, unspecified: Secondary | ICD-10-CM | POA: Diagnosis not present

## 2020-12-30 DIAGNOSIS — M62838 Other muscle spasm: Secondary | ICD-10-CM | POA: Diagnosis not present

## 2020-12-30 DIAGNOSIS — M542 Cervicalgia: Secondary | ICD-10-CM

## 2020-12-30 NOTE — Therapy (Signed)
[place] [facility] [address] [place], [place], [address] Phone: [phone]   Fax:  [phone]  Physical Therapy Treatment  Patient Details  Name: Jasmine Rollins MRN: [id number] Date of Birth: Jan 30, 1951 Referring Provider (PT): Eula Listen, MD (Orthopedics)   Encounter Date: 12/30/2020   PT End of Session - 12/30/20 1205     Visit Number 2    Number of Visits 24    Date for PT Re-Evaluation 03/22/21    Authorization Type [employer] MEDICARE reporting period from 12/28/2020    Progress Note Due on Visit 10    PT Start Time 1126    PT Stop Time 1204    PT Time Calculation (min) 38 min    Activity Tolerance Patient tolerated treatment well    Behavior During Therapy WFL for tasks assessed/performed             Past Medical History:  Diagnosis Date   Allergic rhinitis    Arthritis    hip   Borderline high cholesterol    Cataract    Cough variant asthma    Diverticulosis of colon    GERD (gastroesophageal reflux disease)    HTN (hypertension)    Hyperglycemia    borderline DM   OP (osteoporosis)    Seasonal allergies    Sleep apnea    no cpap    Past Surgical History:  Procedure Laterality Date   COLONOSCOPY     COLPOSCOPY     ESOPHAGOGASTRODUODENOSCOPY  10/01   Negative   ROTATOR CUFF REPAIR     right   TUBAL LIGATION      There were no vitals filed for this visit.   Subjective Assessment - 12/30/20 1126     Subjective Patient reports she has no pain at rest but has up to 3/10 when she turns her head. She felt better after her last session and did not need any pain medication. She has been doing her HEP and feels she is getting good at it. She does feel it in her mid back when she does the exercise.    Pertinent History Patient is a 70 y.o. female who presents to outpatient physical therapy with a referral for medical diagnosis neck pain and stiffness. This patient's chief complaints consist of  cervical spine pain and stiffness, L > R, and head pain leading to the following functional deficits: difficulty with walking for exercise, driving, turning head to view surroundings and be safe, stationary bike, looking up, looking down, reading, grooming, doing hair, lifting, sleeping.  Relevant past medical history and comorbidities include recent shingles, HTN, sleep apnea (untreated), GERD, asthma if she coughs, osteoporosis, diverticulitis (asymptomatic), suspect glaucoma, hx right knee pain, hx R RTC repair.  Patient denies hx of cancer, stroke, seizures, lung problem, major cardiac events, unexplained weight loss, changes in bowel or bladder problems, new onset stumbling or dropping things, spinal surgery.    Limitations Reading;Lifting;Walking;House hold activities;Sitting   walking, driving, turning head to view surroundings and be safe, stationary bike, looking up, looking down, reading, grooming, doing hair, lifting, sleeping.   Diagnostic tests cervical spine radiograph 11/29/2020: "FINDINGS:  Normal alignment. Bones are osteopenic. Normal prevertebral soft  tissues. Degenerative disc disease most severe at C5-6 with marked  disc space narrowing, sclerosis and osteophyte formation. Additional  degenerative changes at C6-7 and multilevel facet arthropathy noted  posteriorly.  Trachea midline.  Lung apices are clear.  IMPRESSION:  Lower cervical degenerative changes as  above, most pronounced at  C5-6.  No acute finding or malalignment by plain radiography."    Patient Stated Goals "to have no pain in my neck" "to be able to move it like I always used to"    Currently in Pain? Yes    Pain Score 3     Pain Onset More than a month ago               OBJECTIVE  STRENGTH (MMT):  Shoulder  Flexion: R = 5/5, L = 5/5.  Abduction: R = 5/5, L = 5/5. External rotation: R = 4+/5, L = 4/5. Internal rotation: R = 5/5, L = 5/5. Elbow Flexion: R = 5/5, L = 5/5. Extension: R = 4+/5, L =  5/5. Hand Thumb extension: B = WFL Finger abduction: B = WFL    TREATMENT:  Therapeutic exercise: to centralize symptoms and improve ROM, strength, muscular endurance, and activity tolerance required for successful completion of functional activities.  - testing to assess UE strength (see above).  - supine chin tuck, 5 second hold, 2x10 - supine cervical spine AROM, 1x10 each side.  - Education on HEP including handout    Manual therapy: to reduce pain and tissue tension, improve range of motion, neuromodulation, in order to promote improved ability to complete functional activities. SUPINE - STM to posterior cervical spine musculature including bilateral upper traps, cervical paraspinals, scalenes, suboccipital muscles - gentle intermittent manual cervical spine traction - gentle grade II-III joint mob upglides along cervical spine segments each direction.  - PROM rotation with 2 second hold, 2x10 each side - PROM UT stretch, 3x30 seconds each side   Pt required multimodal cuing for proper technique and to facilitate improved neuromuscular control, strength, range of motion, and functional ability resulting in improved performance and form.   Patient reports feeling better after exam and treatment.    HOME EXERCISE PROGRAM Access Code: [email or web address] URL: [email or web address] Date: 12/30/2020 Prepared by: Norton Blizzard  Exercises Supine Chin Tuck - 2 x daily - 2 sets - 10 reps - 5 hold Supine Cervical Rotation AROM on Pillow - 2 x daily - 2 sets - 10 reps      PT Education - 12/30/20 1204     Education Details Exercise purpose/form. Self management techniques    Person(s) Educated Patient    Methods Explanation;Demonstration;Tactile cues;Verbal cues    Comprehension Verbalized understanding;Returned demonstration;Verbal cues required;Tactile cues required;Need further instruction              PT Short Term Goals - 12/28/20 1455       PT SHORT TERM GOAL #1    Title Be independent with initial home exercise program for self-management of symptoms.    Baseline initial HEP provided at IE (12/28/2020);    Time 2    Period Weeks    Status New    Target Date 01/11/21               PT Long Term Goals - 12/28/20 1456       PT LONG TERM GOAL #1   Title Be independent with a long-term home exercise program for self-management of symptoms.    Baseline initial HEP provided at IE (12/28/2020);    Time 12    Period Weeks    Status New   TARGET DATE FOR ALL LONG TERM GOALS: 03/22/2021     PT LONG TERM GOAL #2   Title Demonstrate improved FOTO to equal or greater than 58 by  visit #12 to demonstrate improvement in overall condition and self-reported functional ability.    Baseline 42 (12/28/2020);    Time 12    Period Weeks    Status New      PT LONG TERM GOAL #3   Title Reduce pain with functional activities to equal or less than 1/10 to allow patient to complete usual activities including housework, walking, turning head with less difficulty.    Baseline 5/10 (12/28/2020);    Time 12    Period Weeks    Status New      PT LONG TERM GOAL #4   Title Patient will demonstrate cervical spine AROM rotation to equal or greater than 60 degrees each way to improve ability to check blind spot while driving and view surroundings.    Baseline R 45 pain at base of left neck  L 30 pain at base of left neck (12/28/2020);    Time 12    Period Weeks    Status New      PT LONG TERM GOAL #5   Title Complete community, work and/or recreational activities without limitation due to current condition.    Baseline Functional Limitations: walking for exercise, driving, turning head to view surroundings and be safe, stationary bike, looking up, looking down, reading, grooming, doing hair, lifting, sleeping, housework/cleaning (12/28/2020);    Time 12    Period Weeks    Status New                   Plan - 12/30/20 1203     Clinical Impression  Statement Patient tolerated treatment well overall but continues to have limitations in cervical spine rotation and function. Did report decreased pain with motion by end of session. Updated HEP to include AROM cervical spine rotation in supine. Patient would benefit from continued management of limiting condition by skilled physical therapist to address remaining impairments and functional limitations to work towards stated goals and return to PLOF or maximal functional independence.    Personal Factors and Comorbidities Age;Comorbidity 3+;Past/Current Experience;Time since onset of injury/illness/exacerbation    Comorbidities Relevant past medical history and comorbidities include recent shingles, HTN, sleep apnea (untreated), GERD, asthma if she coughs, osteoporosis, diverticulitis (asymptomatic), suspect glaucoma, hx right knee pain, hx R RTC repair.    Examination-Activity Limitations Bathing;Hygiene/Grooming;Lift;Caring for Others;Carry;Sleep    Examination-Participation Restrictions Driving;Community Activity;Laundry;Cleaning;Interpersonal Relationship;Other   walking for exercise, driving, turning head to view surroundings and be safe, stationary bike, looking up, looking down, reading, grooming, doing hair, lifting, sleeping, housework/cleaning.   Stability/Clinical Decision Making Evolving/Moderate complexity    Rehab Potential Good    PT Frequency 2x / week    PT Duration 12 weeks    PT Treatment/Interventions ADLs/Self Care Home Management;Cryotherapy;Moist Heat;Therapeutic activities;Therapeutic exercise;Neuromuscular re-education;Manual techniques;Dry needling;Passive range of motion;Patient/family education    PT Next Visit Plan update HEP as appropriate, manual therapy, graded motor control, strengthening, and stretching exercises as tolerated.    PT Home Exercise Plan Medbridge Access Code: [email or web address]    Consulted and Agree with Plan of Care Patient             Patient will benefit  from skilled therapeutic intervention in order to improve the following deficits and impairments:  Decreased skin integrity, Pain, Improper body mechanics, Postural dysfunction, Increased muscle spasms, Decreased activity tolerance, Decreased endurance, Decreased range of motion, Decreased strength, Hypomobility, Impaired perceived functional ability, Impaired flexibility  Visit Diagnosis: Cervicalgia  Other muscle spasm  Nonintractable headache, unspecified chronicity pattern, unspecified  headache type     Problem List Patient Active Problem List   Diagnosis Date Noted   Headache 12/08/2020   Post herpetic neuralgia 12/02/2020   Scalp pain 12/02/2020   Neck pain 11/20/2020   Insomnia 11/05/2020   Skin lesion 08/05/2020   Right knee pain 11/22/2019   Elevated TSH 12/14/2015   Vitamin D deficiency 12/06/2015   GERD (gastroesophageal reflux disease) 06/09/2014   Encounter for routine gynecological examination 03/03/2014   Estrogen deficiency 03/03/2014   Heartburn 03/01/2013   Gynecological examination 06/22/2010   Special screening for malignant neoplasms, colon 06/22/2010   Glaucoma suspect 06/22/2010   Routine general medical examination at a health care facility 06/03/2010   Sleep apnea    Osteoporosis 05/20/2009   SLEEP APNEA 10/17/2008   COUGH VARIANT ASTHMA 07/30/2008   Leukocytopenia 01/29/2008   Prediabetes 11/27/2007   HYPERCHOLESTEROLEMIA 03/21/2007   Essential hypertension 03/21/2007   Sinusitis, chronic 03/21/2007   Allergic rhinitis 03/21/2007   DIVERTICULOSIS, COLON 03/21/2007    Luretha Murphy. Ilsa Iha, PT, DPT 12/30/20, 12:07 PM   [facility] SPORTS MEDICINE [address], [place], [address] Phone: [phone]   Fax:  [phone]  Name: Jasmine Rollins MRN: [id number] Date of Birth: 08-26-1950

## 2020-12-31 ENCOUNTER — Encounter: Payer: Medicare Other | Admitting: Physical Therapy

## 2021-01-04 ENCOUNTER — Encounter: Payer: Self-pay | Admitting: Physical Therapy

## 2021-01-04 ENCOUNTER — Ambulatory Visit: Payer: Medicare Other | Admitting: Physical Therapy

## 2021-01-04 DIAGNOSIS — M62838 Other muscle spasm: Secondary | ICD-10-CM | POA: Diagnosis not present

## 2021-01-04 DIAGNOSIS — M542 Cervicalgia: Secondary | ICD-10-CM | POA: Diagnosis not present

## 2021-01-04 DIAGNOSIS — R519 Headache, unspecified: Secondary | ICD-10-CM | POA: Diagnosis not present

## 2021-01-04 NOTE — Therapy (Signed)
[place] [facility] [address] [place], [place], [address] Phone: [phone]   Fax:  [phone]  Physical Therapy Treatment  Patient Details  Name: Jasmine Rollins MRN: [id number] Date of Birth: Aug 25, 1950 Referring Provider (PT): Eula Listen, MD (Orthopedics)   Encounter Date: 01/04/2021   PT End of Session - 01/04/21 1117     Visit Number 3    Number of Visits 24    Date for PT Re-Evaluation 03/22/21    Authorization Type [employer] MEDICARE reporting period from 12/28/2020    Progress Note Due on Visit 10    PT Start Time 1115    PT Stop Time 1200    PT Time Calculation (min) 45 min    Activity Tolerance Patient tolerated treatment well    Behavior During Therapy [facility] for tasks assessed/performed             Past Medical History:  Diagnosis Date   Allergic rhinitis    Arthritis    hip   Borderline high cholesterol    Cataract    Cough variant asthma    Diverticulosis of colon    GERD (gastroesophageal reflux disease)    HTN (hypertension)    Hyperglycemia    borderline DM   OP (osteoporosis)    Seasonal allergies    Sleep apnea    no cpap    Past Surgical History:  Procedure Laterality Date   COLONOSCOPY     COLPOSCOPY     ESOPHAGOGASTRODUODENOSCOPY  10/01   Negative   ROTATOR CUFF REPAIR     right   TUBAL LIGATION      There were no vitals filed for this visit.   Subjective Assessment - 01/04/21 1116     Subjective Patient reports she is feeling well today. States she has 1-2/10 pain in her neck, L > R, when she moves it. States she felt better after last PT session and her HEP helped her as well. She only did it once yesterday. she finished her predgnisone taper yesterday.    Pertinent History Patient is a 70 y.o. female who presents to outpatient physical therapy with a referral for medical diagnosis neck pain and stiffness. This patient's chief complaints consist of cervical spine pain  and stiffness, L > R, and head pain leading to the following functional deficits: difficulty with walking for exercise, driving, turning head to view surroundings and be safe, stationary bike, looking up, looking down, reading, grooming, doing hair, lifting, sleeping.  Relevant past medical history and comorbidities include recent shingles, HTN, sleep apnea (untreated), GERD, asthma if she coughs, osteoporosis, diverticulitis (asymptomatic), suspect glaucoma, hx right knee pain, hx R RTC repair.  Patient denies hx of cancer, stroke, seizures, lung problem, major cardiac events, unexplained weight loss, changes in bowel or bladder problems, new onset stumbling or dropping things, spinal surgery.    Limitations Reading;Lifting;Walking;House hold activities;Sitting   walking, driving, turning head to view surroundings and be safe, stationary bike, looking up, looking down, reading, grooming, doing hair, lifting, sleeping.   Diagnostic tests cervical spine radiograph 11/29/2020: "FINDINGS:  Normal alignment. Bones are osteopenic. Normal prevertebral soft  tissues. Degenerative disc disease most severe at C5-6 with marked  disc space narrowing, sclerosis and osteophyte formation. Additional  degenerative changes at C6-7 and multilevel facet arthropathy noted  posteriorly.  Trachea midline.  Lung apices are clear.  IMPRESSION:  Lower cervical degenerative changes as above, most pronounced at  C5-6.  No acute finding  or malalignment by plain radiography."    Patient Stated Goals "to have no pain in my neck" "to be able to move it like I always used to"    Currently in Pain? Yes    Pain Score 2     Pain Onset More than a month ago              TREATMENT:  Therapeutic exercise: to centralize symptoms and improve ROM, strength, muscular endurance, and activity tolerance required for successful completion of functional activities.  - seated upper body ergometer level 3 encourage joint nutrition, warm tissue,  induce analgesic effect of aerobic exercise, improve muscular strength and endurance,  and prepare for remainder of session. 5  min switching directions every 1 min.  (Manual therapy - see below).  - supine chin tuck, 5 second hold, 1x20 - supine chin tuck to lift, 2x10 - supine cervical spine AROM, rotation and nodding with occipital float behind head, 1x10 each side.  - standing scapular row, 1x10 with RTB - Education on HEP including handout    Manual therapy: to reduce pain and tissue tension, improve range of motion, neuromodulation, in order to promote improved ability to complete functional activities. SUPINE - STM to posterior cervical spine musculature including bilateral upper traps, cervical paraspinals, scalenes, suboccipital muscles - gentle intermittent manual cervical spine traction - gentle grade II-III joint mob upglides along cervical spine segments each direction.  - PROM rotation with 2 second hold, 1x10 each side (painful to the left).  - PROM UT stretch, 3x30 seconds each side -  retraction mobilization targeting left suboccipital region (painful each rep, no worse). Slight R sidebend/L rotation.    Pt required multimodal cuing for proper technique and to facilitate improved neuromuscular control, strength, range of motion, and functional ability resulting in improved performance and form.    HOME EXERCISE PROGRAM Access Code: [email or web address] URL: [email or web address] Date: 01/04/2021 Prepared by: Norton Blizzard  Exercises Supine Chin Tuck - 2 x daily - 2 sets - 10 reps - 5 hold Supine Deep Neck Flexor Training - Repetitions - 2 x daily - 2 sets - 10 reps Supine Cervical Rotation AROM on Pillow - 2 x daily - 2 sets - 10 reps Row with band/cable - 1 x daily - 3 sets - 10 reps - 2 seconds hold   PT Education - 01/04/21 1117     Education Details Exercise purpose/form. Self management techniques    Person(s) Educated Patient    Methods  Explanation;Demonstration;Tactile cues;Verbal cues    Comprehension Verbalized understanding;Returned demonstration;Verbal cues required;Tactile cues required;Need further instruction              PT Short Term Goals - 12/28/20 1455       PT SHORT TERM GOAL #1   Title Be independent with initial home exercise program for self-management of symptoms.    Baseline initial HEP provided at IE (12/28/2020);    Time 2    Period Weeks    Status New    Target Date 01/11/21               PT Long Term Goals - 12/28/20 1456       PT LONG TERM GOAL #1   Title Be independent with a long-term home exercise program for self-management of symptoms.    Baseline initial HEP provided at IE (12/28/2020);    Time 12    Period Weeks    Status New   TARGET DATE FOR ALL LONG TERM  GOALS: 03/22/2021     PT LONG TERM GOAL #2   Title Demonstrate improved FOTO to equal or greater than 58 by visit #12 to demonstrate improvement in overall condition and self-reported functional ability.    Baseline 42 (12/28/2020);    Time 12    Period Weeks    Status New      PT LONG TERM GOAL #3   Title Reduce pain with functional activities to equal or less than 1/10 to allow patient to complete usual activities including housework, walking, turning head with less difficulty.    Baseline 5/10 (12/28/2020);    Time 12    Period Weeks    Status New      PT LONG TERM GOAL #4   Title Patient will demonstrate cervical spine AROM rotation to equal or greater than 60 degrees each way to improve ability to check blind spot while driving and view surroundings.    Baseline R 45 pain at base of left neck  L 30 pain at base of left neck (12/28/2020);    Time 12    Period Weeks    Status New      PT LONG TERM GOAL #5   Title Complete community, work and/or recreational activities without limitation due to current condition.    Baseline Functional Limitations: walking for exercise, driving, turning head to view  surroundings and be safe, stationary bike, looking up, looking down, reading, grooming, doing hair, lifting, sleeping, housework/cleaning (12/28/2020);    Time 12    Period Weeks    Status New                   Plan - 01/04/21 1158     Clinical Impression Statement Patient tolerated treatment well overall and was able to progress neck motor control/strengthening exercises. Patient continues to be sensitive and stiff to left cervical spine rotation and tender to palpation at left suboccipital regions. Patient would benefit from continued management of limiting condition by skilled physical therapist to address remaining impairments and functional limitations to work towards stated goals and return to PLOF or maximal functional independence.    Personal Factors and Comorbidities Age;Comorbidity 3+;Past/Current Experience;Time since onset of injury/illness/exacerbation    Comorbidities Relevant past medical history and comorbidities include recent shingles, HTN, sleep apnea (untreated), GERD, asthma if she coughs, osteoporosis, diverticulitis (asymptomatic), suspect glaucoma, hx right knee pain, hx R RTC repair.    Examination-Activity Limitations Bathing;Hygiene/Grooming;Lift;Caring for Others;Carry;Sleep    Examination-Participation Restrictions Driving;Community Activity;Laundry;Cleaning;Interpersonal Relationship;Other   walking for exercise, driving, turning head to view surroundings and be safe, stationary bike, looking up, looking down, reading, grooming, doing hair, lifting, sleeping, housework/cleaning.   Stability/Clinical Decision Making Evolving/Moderate complexity    Rehab Potential Good    PT Frequency 2x / week    PT Duration 12 weeks    PT Treatment/Interventions ADLs/Self Care Home Management;Cryotherapy;Moist Heat;Therapeutic activities;Therapeutic exercise;Neuromuscular re-education;Manual techniques;Dry needling;Passive range of motion;Patient/family education    PT Next  Visit Plan update HEP as appropriate, manual therapy, graded motor control, strengthening, and stretching exercises as tolerated.    PT Home Exercise Plan Medbridge Access Code: [email or web address]    Consulted and Agree with Plan of Care Patient             Patient will benefit from skilled therapeutic intervention in order to improve the following deficits and impairments:  Decreased skin integrity, Pain, Improper body mechanics, Postural dysfunction, Increased muscle spasms, Decreased activity tolerance, Decreased endurance, Decreased range of motion, Decreased strength,  Hypomobility, Impaired perceived functional ability, Impaired flexibility  Visit Diagnosis: Cervicalgia  Other muscle spasm  Nonintractable headache, unspecified chronicity pattern, unspecified headache type     Problem List Patient Active Problem List   Diagnosis Date Noted   Headache 12/08/2020   Post herpetic neuralgia 12/02/2020   Scalp pain 12/02/2020   Neck pain 11/20/2020   Insomnia 11/05/2020   Skin lesion 08/05/2020   Right knee pain 11/22/2019   Elevated TSH 12/14/2015   Vitamin D deficiency 12/06/2015   GERD (gastroesophageal reflux disease) 06/09/2014   Encounter for routine gynecological examination 03/03/2014   Estrogen deficiency 03/03/2014   Heartburn 03/01/2013   Gynecological examination 06/22/2010   Special screening for malignant neoplasms, colon 06/22/2010   Glaucoma suspect 06/22/2010   Routine general medical examination at a health care facility 06/03/2010   Sleep apnea    Osteoporosis 05/20/2009   SLEEP APNEA 10/17/2008   COUGH VARIANT ASTHMA 07/30/2008   Leukocytopenia 01/29/2008   Prediabetes 11/27/2007   HYPERCHOLESTEROLEMIA 03/21/2007   Essential hypertension 03/21/2007   Sinusitis, chronic 03/21/2007   Allergic rhinitis 03/21/2007   DIVERTICULOSIS, COLON 03/21/2007    Luretha Murphy. Ilsa Iha, PT, DPT 01/04/21, 11:59 AM   [place] PHYSICAL AND  SPORTS MEDICINE [address] [place], [address] Phone: [phone]   Fax:  [phone]  Name: Jasmine Rollins MRN: [id number] Date of Birth: 04/23/1950

## 2021-01-06 ENCOUNTER — Ambulatory Visit: Payer: Medicare Other | Admitting: Physical Therapy

## 2021-01-06 ENCOUNTER — Encounter: Payer: Self-pay | Admitting: Physical Therapy

## 2021-01-06 DIAGNOSIS — M62838 Other muscle spasm: Secondary | ICD-10-CM

## 2021-01-06 DIAGNOSIS — R519 Headache, unspecified: Secondary | ICD-10-CM

## 2021-01-06 DIAGNOSIS — M542 Cervicalgia: Secondary | ICD-10-CM

## 2021-01-06 NOTE — Therapy (Signed)
[place] [facility] PHYS[facility] SPORTS MEDICINE [address] [place], [place], [address] Phone: [phone]   Fax:  [phone]  Physical Therapy Treatment  Patient Details  Name: Jasmine Rollins MRN: [id number] Date of Birth: 1950/11/09 Referring Provider (PT): Eula Listen, MD (Orthopedics)   Encounter Date: 01/06/2021   PT End of Session - 01/06/21 1132     Visit Number 4    Number of Visits 24    Date for PT Re-Evaluation 03/22/21    Authorization Type [employer] MEDICARE reporting period from 12/28/2020    Progress Note Due on Visit 10    PT Start Time 1122    PT Stop Time 1200    PT Time Calculation (min) 38 min    Activity Tolerance Patient tolerated treatment well    Behavior During Therapy [facility] for tasks assessed/performed             Past Medical History:  Diagnosis Date   Allergic rhinitis    Arthritis    hip   Borderline high cholesterol    Cataract    Cough variant asthma    Diverticulosis of colon    GERD (gastroesophageal reflux disease)    HTN (hypertension)    Hyperglycemia    borderline DM   OP (osteoporosis)    Seasonal allergies    Sleep apnea    no cpap    Past Surgical History:  Procedure Laterality Date   COLONOSCOPY     COLPOSCOPY     ESOPHAGOGASTRODUODENOSCOPY  10/01   Negative   ROTATOR CUFF REPAIR     right   TUBAL LIGATION      There were no vitals filed for this visit.   Subjective Assessment - 01/06/21 1125     Subjective Patinet reports she is feeling okay today but wishes she could say she is pain free. She has beek doing her HEP exept the rows because she is having a hard time attaching it to the door. Reports current pain is 1/10 mostly on the left lower neck when she turns to each side.    Pertinent History Patient is a 70 y.o. female who presents to outpatient physical therapy with a referral for medical diagnosis neck pain and stiffness. This patient's chief complaints consist of cervical  spine pain and stiffness, L > R, and head pain leading to the following functional deficits: difficulty with walking for exercise, driving, turning head to view surroundings and be safe, stationary bike, looking up, looking down, reading, grooming, doing hair, lifting, sleeping.  Relevant past medical history and comorbidities include recent shingles, HTN, sleep apnea (untreated), GERD, asthma if she coughs, osteoporosis, diverticulitis (asymptomatic), suspect glaucoma, hx right knee pain, hx R RTC repair.  Patient denies hx of cancer, stroke, seizures, lung problem, major cardiac events, unexplained weight loss, changes in bowel or bladder problems, new onset stumbling or dropping things, spinal surgery.    Limitations Reading;Lifting;Walking;House hold activities;Sitting   walking, driving, turning head to view surroundings and be safe, stationary bike, looking up, looking down, reading, grooming, doing hair, lifting, sleeping.   Diagnostic tests cervical spine radiograph 11/29/2020: "FINDINGS:  Normal alignment. Bones are osteopenic. Normal prevertebral soft  tissues. Degenerative disc disease most severe at C5-6 with marked  disc space narrowing, sclerosis and osteophyte formation. Additional  degenerative changes at C6-7 and multilevel facet arthropathy noted  posteriorly.  Trachea midline.  Lung apices are clear.  IMPRESSION:  Lower cervical degenerative changes as above, most pronounced at  C5-6.  No acute finding or malalignment by plain radiography."    Patient Stated Goals "to have no pain in my neck" "to be able to move it like I always used to"    Currently in Pain? Yes    Pain Score 1     Pain Onset More than a month ago             TREATMENT:  Therapeutic exercise: to centralize symptoms and improve ROM, strength, muscular endurance, and activity tolerance required for successful completion of functional activities.  - standing scapular row, 3x10 with RTB (Manual therapy - see below).   - supine chin tuck, 5 second hold, 2x10  Manual therapy: to reduce pain and tissue tension, improve range of motion, neuromodulation, in order to promote improved ability to complete functional activities. SUPINE - STM to posterior cervical spine musculature including bilateral upper traps, cervical paraspinals, scalenes, suboccipital muscles - gentle intermittent manual cervical spine traction - gentle grade II-III joint mob upglides along cervical spine segments each direction.  - PROM rotation with 2 second hold, 1x10 each side (end range pain each side).  - Contract relax stretch towards left rotation, 1x10 cycles of contract/relax   Pt required multimodal cuing for proper technique and to facilitate improved neuromuscular control, strength, range of motion, and functional ability resulting in improved performance and form.     HOME EXERCISE PROGRAM Access Code: [email or web address] URL: [email or web address] Date: 01/06/2021 Prepared by: Norton Blizzard  Exercises Supine Chin Tuck - 2 x daily - 2 sets - 10 reps - 5 hold Supine Deep Neck Flexor Training - Repetitions - 2 x daily - 2 sets - 10 reps Seated Cervical Rotation AROM - 2 x daily - 2 sets - 10 reps Row with band/cable - 1 x daily - 3 sets - 10 reps - 2 seconds hold     PT Education - 01/06/21 1132     Education Details Exercise purpose/form. Self management techniques    Person(s) Educated Patient    Methods Explanation;Demonstration;Tactile cues;Verbal cues    Comprehension Verbalized understanding;Returned demonstration;Verbal cues required;Tactile cues required;Need further instruction              PT Short Term Goals - 01/06/21 1135       PT SHORT TERM GOAL #1   Title Be independent with initial home exercise program for self-management of symptoms.    Baseline initial HEP provided at IE (12/28/2020);    Time 2    Period Weeks    Status Achieved    Target Date 01/11/21               PT Long  Term Goals - 12/28/20 1456       PT LONG TERM GOAL #1   Title Be independent with a long-term home exercise program for self-management of symptoms.    Baseline initial HEP provided at IE (12/28/2020);    Time 12    Period Weeks    Status New   TARGET DATE FOR ALL LONG TERM GOALS: 03/22/2021     PT LONG TERM GOAL #2   Title Demonstrate improved FOTO to equal or greater than 58 by visit #12 to demonstrate improvement in overall condition and self-reported functional ability.    Baseline 42 (12/28/2020);    Time 12    Period Weeks    Status New      PT LONG TERM GOAL #3   Title Reduce pain with functional activities to equal or less than  1/10 to allow patient to complete usual activities including housework, walking, turning head with less difficulty.    Baseline 5/10 (12/28/2020);    Time 12    Period Weeks    Status New      PT LONG TERM GOAL #4   Title Patient will demonstrate cervical spine AROM rotation to equal or greater than 60 degrees each way to improve ability to check blind spot while driving and view surroundings.    Baseline R 45 pain at base of left neck  L 30 pain at base of left neck (12/28/2020);    Time 12    Period Weeks    Status New      PT LONG TERM GOAL #5   Title Complete community, work and/or recreational activities without limitation due to current condition.    Baseline Functional Limitations: walking for exercise, driving, turning head to view surroundings and be safe, stationary bike, looking up, looking down, reading, grooming, doing hair, lifting, sleeping, housework/cleaning (12/28/2020);    Time 12    Period Weeks    Status New                   Plan - 01/06/21 1204     Clinical Impression Statement Patient tolerated treatment well overall and demonstrate improves cervical spine ROM. Patient continues to be limited by neck pain L > R with motion and has not yet returned to PLOF. Plan to incorporate more strengthening exercises next  session. Patient would benefit from continued management of limiting condition by skilled physical therapist to address remaining impairments and functional limitations to work towards stated goals and return to PLOF or maximal functional independence.    Personal Factors and Comorbidities Age;Comorbidity 3+;Past/Current Experience;Time since onset of injury/illness/exacerbation    Comorbidities Relevant past medical history and comorbidities include recent shingles, HTN, sleep apnea (untreated), GERD, asthma if she coughs, osteoporosis, diverticulitis (asymptomatic), suspect glaucoma, hx right knee pain, hx R RTC repair.    Examination-Activity Limitations Bathing;Hygiene/Grooming;Lift;Caring for Others;Carry;Sleep    Examination-Participation Restrictions Driving;Community Activity;Laundry;Cleaning;Interpersonal Relationship;Other   walking for exercise, driving, turning head to view surroundings and be safe, stationary bike, looking up, looking down, reading, grooming, doing hair, lifting, sleeping, housework/cleaning.   Stability/Clinical Decision Making Evolving/Moderate complexity    Rehab Potential Good    PT Frequency 2x / week    PT Duration 12 weeks    PT Treatment/Interventions ADLs/Self Care Home Management;Cryotherapy;Moist Heat;Therapeutic activities;Therapeutic exercise;Neuromuscular re-education;Manual techniques;Dry needling;Passive range of motion;Patient/family education    PT Next Visit Plan update HEP as appropriate, manual therapy, graded motor control, strengthening, and stretching exercises as tolerated.    PT Home Exercise Plan Medbridge Access Code: [email or web address]    Consulted and Agree with Plan of Care Patient             Patient will benefit from skilled therapeutic intervention in order to improve the following deficits and impairments:  Decreased skin integrity, Pain, Improper body mechanics, Postural dysfunction, Increased muscle spasms, Decreased activity tolerance,  Decreased endurance, Decreased range of motion, Decreased strength, Hypomobility, Impaired perceived functional ability, Impaired flexibility  Visit Diagnosis: Cervicalgia  Other muscle spasm  Nonintractable headache, unspecified chronicity pattern, unspecified headache type     Problem List Patient Active Problem List   Diagnosis Date Noted   Headache 12/08/2020   Post herpetic neuralgia 12/02/2020   Scalp pain 12/02/2020   Neck pain 11/20/2020   Insomnia 11/05/2020   Skin lesion 08/05/2020   Right knee pain 11/22/2019  Elevated TSH 12/14/2015   Vitamin D deficiency 12/06/2015   GERD (gastroesophageal reflux disease) 06/09/2014   Encounter for routine gynecological examination 03/03/2014   Estrogen deficiency 03/03/2014   Heartburn 03/01/2013   Gynecological examination 06/22/2010   Special screening for malignant neoplasms, colon 06/22/2010   Glaucoma suspect 06/22/2010   Routine general medical examination at a health care facility 06/03/2010   Sleep apnea    Osteoporosis 05/20/2009   SLEEP APNEA 10/17/2008   COUGH VARIANT ASTHMA 07/30/2008   Leukocytopenia 01/29/2008   Prediabetes 11/27/2007   HYPERCHOLESTEROLEMIA 03/21/2007   Essential hypertension 03/21/2007   Sinusitis, chronic 03/21/2007   Allergic rhinitis 03/21/2007   DIVERTICULOSIS, COLON 03/21/2007    Luretha Murphy. Ilsa Iha, PT, DPT 01/06/21, 12:06 PM   [facility] PHYS[facility] AND SPORTS MEDICINE [address], [place], [address] Phone: [phone]   Fax:  [phone]  Name: DAYANNA PRYCE MRN: [id number] Date of Birth: [birth year]

## 2021-01-08 ENCOUNTER — Other Ambulatory Visit: Payer: Self-pay | Admitting: Family Medicine

## 2021-01-11 ENCOUNTER — Ambulatory Visit: Payer: Medicare Other | Admitting: Physical Therapy

## 2021-01-11 ENCOUNTER — Encounter: Payer: Self-pay | Admitting: Physical Therapy

## 2021-01-11 DIAGNOSIS — M62838 Other muscle spasm: Secondary | ICD-10-CM

## 2021-01-11 DIAGNOSIS — M542 Cervicalgia: Secondary | ICD-10-CM

## 2021-01-11 DIAGNOSIS — R519 Headache, unspecified: Secondary | ICD-10-CM | POA: Diagnosis not present

## 2021-01-11 NOTE — Therapy (Signed)
[place] [facility] PHYS[facility] SPORTS MEDICINE [address] [place], [place], [address] Phone: [phone]   Fax:  [phone]  Physical Therapy Treatment  Patient Details  Name: Jasmine Rollins MRN: [id number] Date of Birth: 02-22-1950 Referring Provider (PT): Eula Listen, MD (Orthopedics)   Encounter Date: 01/11/2021   PT End of Session - 01/11/21 1204     Visit Number 5    Number of Visits 24    Date for PT Re-Evaluation 03/22/21    Authorization Type [employer] MEDICARE reporting period from 12/28/2020    Progress Note Due on Visit 10    PT Start Time 1125    PT Stop Time 1205    PT Time Calculation (min) 40 min    Activity Tolerance Patient tolerated treatment well    Behavior During Therapy [facility] for tasks assessed/performed             Past Medical History:  Diagnosis Date   Allergic rhinitis    Arthritis    hip   Borderline high cholesterol    Cataract    Cough variant asthma    Diverticulosis of colon    GERD (gastroesophageal reflux disease)    HTN (hypertension)    Hyperglycemia    borderline DM   OP (osteoporosis)    Seasonal allergies    Sleep apnea    no cpap    Past Surgical History:  Procedure Laterality Date   COLONOSCOPY     COLPOSCOPY     ESOPHAGOGASTRODUODENOSCOPY  10/01   Negative   ROTATOR CUFF REPAIR     right   TUBAL LIGATION      There were no vitals filed for this visit.   Subjective Assessment - 01/11/21 1130     Subjective Patient reports she is feeling well today. She states she has been doing her HEP. She went on a walk outside on Thanksgiving and her head started to hurt but it got better by Saturday. She didn't go as far as usual. She feels like her condition is improving. She has no pain today but does feel pulling with rotation.    Pertinent History Patient is a 70 y.o. female who presents to outpatient physical therapy with a referral for medical diagnosis neck pain and stiffness. This  patient's chief complaints consist of cervical spine pain and stiffness, L > R, and head pain leading to the following functional deficits: difficulty with walking for exercise, driving, turning head to view surroundings and be safe, stationary bike, looking up, looking down, reading, grooming, doing hair, lifting, sleeping.  Relevant past medical history and comorbidities include recent shingles, HTN, sleep apnea (untreated), GERD, asthma if she coughs, osteoporosis, diverticulitis (asymptomatic), suspect glaucoma, hx right knee pain, hx R RTC repair.  Patient denies hx of cancer, stroke, seizures, lung problem, major cardiac events, unexplained weight loss, changes in bowel or bladder problems, new onset stumbling or dropping things, spinal surgery.    Limitations Reading;Lifting;Walking;House hold activities;Sitting   walking, driving, turning head to view surroundings and be safe, stationary bike, looking up, looking down, reading, grooming, doing hair, lifting, sleeping.   Diagnostic tests cervical spine radiograph 11/29/2020: "FINDINGS:  Normal alignment. Bones are osteopenic. Normal prevertebral soft  tissues. Degenerative disc disease most severe at C5-6 with marked  disc space narrowing, sclerosis and osteophyte formation. Additional  degenerative changes at C6-7 and multilevel facet arthropathy noted  posteriorly.  Trachea midline.  Lung apices are clear.  IMPRESSION:  Lower cervical degenerative changes  as above, most pronounced at  C5-6.  No acute finding or malalignment by plain radiography."    Patient Stated Goals "to have no pain in my neck" "to be able to move it like I always used to"    Currently in Pain? No/denies    Pain Onset More than a month ago            OBJECTIVE  SELF-REPORTED FUNCTION FOTO score: 44/100 (neck questionnaire)  TREATMENT:  Therapeutic exercise: to centralize symptoms and improve ROM, strength, muscular endurance, and activity tolerance required for  successful completion of functional activities.  - Upper body ergometer level 3 encourage joint nutrition, warm tissue, induce analgesic effect of aerobic exercise, improve muscular strength and endurance,  and prepare for remainder of session. 1x5 min changing directions every 1 min.  - standing scapular row, 3x10 with 10/10/5# cable (has difficulty relaxing SCM/throat) (Manual therapy - see below).  - supine isometric cervical spine rotation with manual resistance, 5 second hold, 1x10 each side.  - supine chin tuck, 5 second hold, 2x10   Manual therapy: to reduce pain and tissue tension, improve range of motion, neuromodulation, in order to promote improved ability to complete functional activities. SUPINE - STM to posterior cervical spine musculature including bilateral upper traps, cervical paraspinals, scalenes, suboccipital muscles - gentle intermittent manual cervical spine traction - PROM rotation with 2-5 second hold, 1x10 each side (end range pain each side).  - Contract relax stretch towards left rotation, 1x10 cycles of contract/relax - PROM UT stretch, 3x30 seconds each side   Pt required multimodal cuing for proper technique and to facilitate improved neuromuscular control, strength, range of motion, and functional ability resulting in improved performance and form.     HOME EXERCISE PROGRAM Access Code: [email or web address] URL: [email or web address] Date: 01/06/2021 Prepared by: Norton Blizzard   Exercises Supine Chin Tuck - 2 x daily - 2 sets - 10 reps - 5 hold Supine Deep Neck Flexor Training - Repetitions - 2 x daily - 2 sets - 10 reps Seated Cervical Rotation AROM - 2 x daily - 2 sets - 10 reps Row with band/cable - 1 x daily - 3 sets - 10 reps - 2 seconds hold     PT Education - 01/11/21 1203     Education Details Exercise purpose/form. Self management techniques    Person(s) Educated Patient    Methods Explanation;Demonstration;Tactile cues;Verbal cues     Comprehension Verbalized understanding;Returned demonstration;Verbal cues required;Tactile cues required;Need further instruction              PT Short Term Goals - 01/06/21 1135       PT SHORT TERM GOAL #1   Title Be independent with initial home exercise program for self-management of symptoms.    Baseline initial HEP provided at IE (12/28/2020);    Time 2    Period Weeks    Status Achieved    Target Date 01/11/21               PT Long Term Goals - 12/28/20 1456       PT LONG TERM GOAL #1   Title Be independent with a long-term home exercise program for self-management of symptoms.    Baseline initial HEP provided at IE (12/28/2020);    Time 12    Period Weeks    Status New   TARGET DATE FOR ALL LONG TERM GOALS: 03/22/2021     PT LONG TERM GOAL #2   Title Demonstrate improved FOTO to  equal or greater than 58 by visit #12 to demonstrate improvement in overall condition and self-reported functional ability.    Baseline 42 (12/28/2020);    Time 12    Period Weeks    Status New      PT LONG TERM GOAL #3   Title Reduce pain with functional activities to equal or less than 1/10 to allow patient to complete usual activities including housework, walking, turning head with less difficulty.    Baseline 5/10 (12/28/2020);    Time 12    Period Weeks    Status New      PT LONG TERM GOAL #4   Title Patient will demonstrate cervical spine AROM rotation to equal or greater than 60 degrees each way to improve ability to check blind spot while driving and view surroundings.    Baseline R 45 pain at base of left neck  L 30 pain at base of left neck (12/28/2020);    Time 12    Period Weeks    Status New      PT LONG TERM GOAL #5   Title Complete community, work and/or recreational activities without limitation due to current condition.    Baseline Functional Limitations: walking for exercise, driving, turning head to view surroundings and be safe, stationary bike, looking up,  looking down, reading, grooming, doing hair, lifting, sleeping, housework/cleaning (12/28/2020);    Time 12    Period Weeks    Status New                   Plan - 01/11/21 1430     Clinical Impression Statement Patient tolerated treatment well overall and reported decreased pain and improved motion by end of session. Continues to be most TTP at left suboccipital region and painful there with end range cervical spine rotation L > R. FOTO score shows trend towards improvement (from 42 to 44). Plan to update HEP with rotation isometrics next session. Patient would benefit from continued management of limiting condition by skilled physical therapist to address remaining impairments and functional limitations to work towards stated goals and return to PLOF or maximal functional independence.    Personal Factors and Comorbidities Age;Comorbidity 3+;Past/Current Experience;Time since onset of injury/illness/exacerbation    Comorbidities Relevant past medical history and comorbidities include recent shingles, HTN, sleep apnea (untreated), GERD, asthma if she coughs, osteoporosis, diverticulitis (asymptomatic), suspect glaucoma, hx right knee pain, hx R RTC repair.    Examination-Activity Limitations Bathing;Hygiene/Grooming;Lift;Caring for Others;Carry;Sleep    Examination-Participation Restrictions Driving;Community Activity;Laundry;Cleaning;Interpersonal Relationship;Other   walking for exercise, driving, turning head to view surroundings and be safe, stationary bike, looking up, looking down, reading, grooming, doing hair, lifting, sleeping, housework/cleaning.   Stability/Clinical Decision Making Evolving/Moderate complexity    Rehab Potential Good    PT Frequency 2x / week    PT Duration 12 weeks    PT Treatment/Interventions ADLs/Self Care Home Management;Cryotherapy;Moist Heat;Therapeutic activities;Therapeutic exercise;Neuromuscular re-education;Manual techniques;Dry needling;Passive range  of motion;Patient/family education    PT Next Visit Plan update HEP as appropriate, manual therapy, graded motor control, strengthening, and stretching exercises as tolerated.    PT Home Exercise Plan Medbridge Access Code: [email or web address]    Consulted and Agree with Plan of Care Patient             Patient will benefit from skilled therapeutic intervention in order to improve the following deficits and impairments:  Decreased skin integrity, Pain, Improper body mechanics, Postural dysfunction, Increased muscle spasms, Decreased activity tolerance, Decreased endurance, Decreased range  of motion, Decreased strength, Hypomobility, Impaired perceived functional ability, Impaired flexibility  Visit Diagnosis: Cervicalgia  Other muscle spasm  Nonintractable headache, unspecified chronicity pattern, unspecified headache type     Problem List Patient Active Problem List   Diagnosis Date Noted   Headache 12/08/2020   Post herpetic neuralgia 12/02/2020   Scalp pain 12/02/2020   Neck pain 11/20/2020   Insomnia 11/05/2020   Skin lesion 08/05/2020   Right knee pain 11/22/2019   Elevated TSH 12/14/2015   Vitamin D deficiency 12/06/2015   GERD (gastroesophageal reflux disease) 06/09/2014   Encounter for routine gynecological examination 03/03/2014   Estrogen deficiency 03/03/2014   Heartburn 03/01/2013   Gynecological examination 06/22/2010   Special screening for malignant neoplasms, colon 06/22/2010   Glaucoma suspect 06/22/2010   Routine general medical examination at a health care facility 06/03/2010   Sleep apnea    Osteoporosis 05/20/2009   SLEEP APNEA 10/17/2008   COUGH VARIANT ASTHMA 07/30/2008   Leukocytopenia 01/29/2008   Prediabetes 11/27/2007   HYPERCHOLESTEROLEMIA 03/21/2007   Essential hypertension 03/21/2007   Sinusitis, chronic 03/21/2007   Allergic rhinitis 03/21/2007   DIVERTICULOSIS, COLON 03/21/2007    Luretha Murphy. Ilsa Iha, PT, DPT 01/11/21, 2:32 PM   [facility] PHYSICAL AND SPORTS MEDICINE [address], [place], [address] Phone: [phone]   Fax:  [phone]  Name: Jasmine Rollins MRN: [id number] Date of Birth: January 16, 1951

## 2021-01-12 ENCOUNTER — Other Ambulatory Visit: Payer: Self-pay | Admitting: Family Medicine

## 2021-01-13 ENCOUNTER — Ambulatory Visit: Payer: Medicare Other | Admitting: Physical Therapy

## 2021-01-13 ENCOUNTER — Encounter: Payer: Self-pay | Admitting: Physical Therapy

## 2021-01-13 DIAGNOSIS — R519 Headache, unspecified: Secondary | ICD-10-CM

## 2021-01-13 DIAGNOSIS — M542 Cervicalgia: Secondary | ICD-10-CM

## 2021-01-13 DIAGNOSIS — M62838 Other muscle spasm: Secondary | ICD-10-CM | POA: Diagnosis not present

## 2021-01-13 NOTE — Therapy (Signed)
[facility] PHYSICAL AND SPORTS MEDICINE [address] [place], [place], [address] Phone: [phone]   Fax:  [phone]  Physical Therapy Treatment  Patient Details  Name: Jasmine Rollins MRN: [id number] Date of Birth: [birth year] Referring Provider (PT): Eula Listen, MD (Orthopedics)   Encounter Date: 01/13/2021   PT End of Session - 01/13/21 1307     Visit Number 6    Number of Visits 24    Date for PT Re-Evaluation 03/22/21    Authorization Type [employer] MEDICARE reporting period from 12/28/2020    Progress Note Due on Visit 10    PT Start Time 1303    PT Stop Time 1343    PT Time Calculation (min) 40 min    Activity Tolerance Patient tolerated treatment well    Behavior During Therapy [facility] for tasks assessed/performed             Past Medical History:  Diagnosis Date   Allergic rhinitis    Arthritis    hip   Borderline high cholesterol    Cataract    Cough variant asthma    Diverticulosis of colon    GERD (gastroesophageal reflux disease)    HTN (hypertension)    Hyperglycemia    borderline DM   OP (osteoporosis)    Seasonal allergies    Sleep apnea    no cpap    Past Surgical History:  Procedure Laterality Date   COLONOSCOPY     COLPOSCOPY     ESOPHAGOGASTRODUODENOSCOPY  10/01   Negative   ROTATOR CUFF REPAIR     right   TUBAL LIGATION      There were no vitals filed for this visit.   Subjective Assessment - 01/13/21 1305     Subjective Patient reports she is feeling well but wonders if she hurt herself yesterday with her exercises. She states she had some increased soreness after she did her exercises and it has remained more sore than it has been. Reports current pain 1/10 when she turns her head. She thinks it may have been when she did the chin tuck to lift exercise.    Pertinent History Patient is a 70 y.o. female who presents to outpatient physical therapy with a referral for medical diagnosis neck pain  and stiffness. This patient's chief complaints consist of cervical spine pain and stiffness, L > R, and head pain leading to the following functional deficits: difficulty with walking for exercise, driving, turning head to view surroundings and be safe, stationary bike, looking up, looking down, reading, grooming, doing hair, lifting, sleeping.  Relevant past medical history and comorbidities include recent shingles, HTN, sleep apnea (untreated), GERD, asthma if she coughs, osteoporosis, diverticulitis (asymptomatic), suspect glaucoma, hx right knee pain, hx R RTC repair.  Patient denies hx of cancer, stroke, seizures, lung problem, major cardiac events, unexplained weight loss, changes in bowel or bladder problems, new onset stumbling or dropping things, spinal surgery.    Limitations Reading;Lifting;Walking;House hold activities;Sitting   walking, driving, turning head to view surroundings and be safe, stationary bike, looking up, looking down, reading, grooming, doing hair, lifting, sleeping.   Diagnostic tests cervical spine radiograph 11/29/2020: "FINDINGS:  Normal alignment. Bones are osteopenic. Normal prevertebral soft  tissues. Degenerative disc disease most severe at C5-6 with marked  disc space narrowing, sclerosis and osteophyte formation. Additional  degenerative changes at C6-7 and multilevel facet arthropathy noted  posteriorly.  Trachea midline.  Lung apices are clear.  IMPRESSION:  Lower cervical  degenerative changes as above, most pronounced at  C5-6.  No acute finding or malalignment by plain radiography."    Patient Stated Goals "to have no pain in my neck" "to be able to move it like I always used to"    Currently in Pain? Yes    Pain Score 1    when rotating head   Pain Onset More than a month ago               TREATMENT:  Therapeutic exercise: to centralize symptoms and improve ROM, strength, muscular endurance, and activity tolerance required for successful completion of  functional activities.  - Upper body ergometer level 3 encourage joint nutrition, warm tissue, induce analgesic effect of aerobic exercise, improve muscular strength and endurance,  and prepare for remainder of session. 1x5 min changing directions every 1 min.  - standing scapular row, 3x10 with 5# cable (has difficulty relaxing SCM/throat with more strain) - standing cervical retraction with self overpressure, standing against wall with half foam roller supporting thoracic spine, 1x20 with 5 second holds.  - Standing cervical thoracic extension/BUE flexion and serratus anterior activation, lat stretch, with foam roller up wall, 5 second holds, 1x20 - Sidelying open book (thoracic rotation) to improve thoracic, shoulder girdle, and upper trunk mobility. Required instruction for technique and cuing to achieve end range as tolerated, hold time, and breathing technique. 5 second holds. 1x10 each side with 5 second holds.  (Manual therapy - see below).  - supine isometric cervical spine rotation with manual resistance, 5 second hold, 1x10 each side.  - supine chin tuck, 5 second hold, 1x10 - supine chin tuck to lift, 2x10   Manual therapy: to reduce pain and tissue tension, improve range of motion, neuromodulation, in order to promote improved ability to complete functional activities. SUPINE - STM to posterior cervical spine musculature including bilateral upper traps, cervical paraspinals, scalenes, suboccipital muscles - PROM rotation with 15 seconds second hold, 1x3 each side (end range pain each side).  - PROM upper cervical spine rotation with overpressure with neck fully flexed, 2x10 each side - L Occipital-Atlantal (OA) Flexion Mobilization (head rotated left and sidebent right). 1x10.    Pt required multimodal cuing for proper technique and to facilitate improved neuromuscular control, strength, range of motion, and functional ability resulting in improved performance and form.     HOME  EXERCISE PROGRAM Access Code: [email or web address] URL: [email or web address] Date: 01/13/2021 Prepared by: Norton Blizzard  Exercises Supine Chin Tuck - 2 x daily - 2 sets - 10 reps - 5 hold Supine Deep Neck Flexor Training - Repetitions - 2 x daily - 2 sets - 10 reps Seated Cervical Rotation AROM - 2 x daily - 2 sets - 10 reps Row with band/cable - 1 x daily - 3 sets - 10 reps - 2 seconds hold Supine Isometric Neck Rotation - 1 x daily - 1 sets - 10 reps - 5 seconds hold        PT Education - 01/13/21 1307     Education Details Exercise purpose/form. Self management techniques    Person(s) Educated Patient    Methods Explanation;Demonstration;Tactile cues;Verbal cues    Comprehension Verbalized understanding;Returned demonstration;Verbal cues required;Tactile cues required;Need further instruction              PT Short Term Goals - 01/06/21 1135       PT SHORT TERM GOAL #1   Title Be independent with initial home exercise program for self-management of symptoms.  Baseline initial HEP provided at IE (12/28/2020);    Time 2    Period Weeks    Status Achieved    Target Date 01/11/21               PT Long Term Goals - 12/28/20 1456       PT LONG TERM GOAL #1   Title Be independent with a long-term home exercise program for self-management of symptoms.    Baseline initial HEP provided at IE (12/28/2020);    Time 12    Period Weeks    Status New   TARGET DATE FOR ALL LONG TERM GOALS: 03/22/2021     PT LONG TERM GOAL #2   Title Demonstrate improved FOTO to equal or greater than 58 by visit #12 to demonstrate improvement in overall condition and self-reported functional ability.    Baseline 42 (12/28/2020);    Time 12    Period Weeks    Status New      PT LONG TERM GOAL #3   Title Reduce pain with functional activities to equal or less than 1/10 to allow patient to complete usual activities including housework, walking, turning head with less difficulty.     Baseline 5/10 (12/28/2020);    Time 12    Period Weeks    Status New      PT LONG TERM GOAL #4   Title Patient will demonstrate cervical spine AROM rotation to equal or greater than 60 degrees each way to improve ability to check blind spot while driving and view surroundings.    Baseline R 45 pain at base of left neck  L 30 pain at base of left neck (12/28/2020);    Time 12    Period Weeks    Status New      PT LONG TERM GOAL #5   Title Complete community, work and/or recreational activities without limitation due to current condition.    Baseline Functional Limitations: walking for exercise, driving, turning head to view surroundings and be safe, stationary bike, looking up, looking down, reading, grooming, doing hair, lifting, sleeping, housework/cleaning (12/28/2020);    Time 12    Period Weeks    Status New                   Plan - 01/13/21 1320     Clinical Impression Statement Patient tolerated treatment well overall and was able to progress to more active interventions. Continues to have pain at the left neck with rotation either way. Continued manual therapy and progressive strengthening/mobility exercises. Patient has not returned to [facility] and continues to be limited by pain and stiffness.  Patient would benefit from continued management of limiting condition by skilled physical therapist to address remaining impairments and functional limitations to work towards stated goals and return to PLOF or maximal functional independence.    Personal Factors and Comorbidities Age;Comorbidity 3+;Past/Current Experience;Time since onset of injury/illness/exacerbation    Comorbidities Relevant past medical history and comorbidities include recent shingles, HTN, sleep apnea (untreated), GERD, asthma if she coughs, osteoporosis, diverticulitis (asymptomatic), suspect glaucoma, hx right knee pain, hx R RTC repair.    Examination-Activity Limitations Bathing;Hygiene/Grooming;Lift;Caring  for Others;Carry;Sleep    Examination-Participation Restrictions Driving;Community Activity;Laundry;Cleaning;Interpersonal Relationship;Other   walking for exercise, driving, turning head to view surroundings and be safe, stationary bike, looking up, looking down, reading, grooming, doing hair, lifting, sleeping, housework/cleaning.   Stability/Clinical Decision Making Evolving/Moderate complexity    Rehab Potential Good    PT Frequency 2x /  week    PT Duration 12 weeks    PT Treatment/Interventions ADLs/Self Care Home Management;Cryotherapy;Moist Heat;Therapeutic activities;Therapeutic exercise;Neuromuscular re-education;Manual techniques;Dry needling;Passive range of motion;Patient/family education    PT Next Visit Plan update HEP as appropriate, manual therapy, graded motor control, strengthening, and stretching exercises as tolerated.    PT Home Exercise Plan Medbridge Access Code: [email or web address]    Consulted and Agree with Plan of Care Patient             Patient will benefit from skilled therapeutic intervention in order to improve the following deficits and impairments:  Decreased skin integrity, Pain, Improper body mechanics, Postural dysfunction, Increased muscle spasms, Decreased activity tolerance, Decreased endurance, Decreased range of motion, Decreased strength, Hypomobility, Impaired perceived functional ability, Impaired flexibility  Visit Diagnosis: Cervicalgia  Other muscle spasm  Nonintractable headache, unspecified chronicity pattern, unspecified headache type     Problem List Patient Active Problem List   Diagnosis Date Noted   Headache 12/08/2020   Post herpetic neuralgia 12/02/2020   Scalp pain 12/02/2020   Neck pain 11/20/2020   Insomnia 11/05/2020   Skin lesion 08/05/2020   Right knee pain 11/22/2019   Elevated TSH 12/14/2015   Vitamin D deficiency 12/06/2015   GERD (gastroesophageal reflux disease) 06/09/2014   Encounter for routine gynecological  examination 03/03/2014   Estrogen deficiency 03/03/2014   Heartburn 03/01/2013   Gynecological examination 06/22/2010   Special screening for malignant neoplasms, colon 06/22/2010   Glaucoma suspect 06/22/2010   Routine general medical examination at a health care facility 06/03/2010   Sleep apnea    Osteoporosis 05/20/2009   SLEEP APNEA 10/17/2008   COUGH VARIANT ASTHMA 07/30/2008   Leukocytopenia 01/29/2008   Prediabetes 11/27/2007   HYPERCHOLESTEROLEMIA 03/21/2007   Essential hypertension 03/21/2007   Sinusitis, chronic 03/21/2007   Allergic rhinitis 03/21/2007   DIVERTICULOSIS, COLON 03/21/2007    Luretha Murphy. Ilsa Iha, PT, DPT 01/13/21, 1:53 PM  [facility] PHYSICAL AND SPORTS MEDICINE [address] [place], [address] Phone: [phone]   Fax:  [phone]  Name: Jasmine Rollins MRN: [id number] Date of Birth: April 06, 1950

## 2021-01-18 ENCOUNTER — Encounter: Payer: Self-pay | Admitting: Physical Therapy

## 2021-01-18 ENCOUNTER — Ambulatory Visit: Payer: Medicare Other | Attending: Family Medicine | Admitting: Physical Therapy

## 2021-01-18 DIAGNOSIS — R519 Headache, unspecified: Secondary | ICD-10-CM | POA: Insufficient documentation

## 2021-01-18 DIAGNOSIS — M62838 Other muscle spasm: Secondary | ICD-10-CM | POA: Diagnosis not present

## 2021-01-18 DIAGNOSIS — M542 Cervicalgia: Secondary | ICD-10-CM | POA: Diagnosis not present

## 2021-01-18 NOTE — Therapy (Signed)
[facility] [address] [place], [place], [address] Phone: [phone]   Fax:  [phone]  Physical Therapy Treatment  Patient Details  Name: Jasmine Rollins MRN: [id number] Date of Birth: 1950/09/07 Referring Provider (PT): Eula Listen, MD (Orthopedics)   Encounter Date: 01/18/2021   PT End of Session - 01/18/21 1127     Visit Number 7    Number of Visits 24    Date for PT Re-Evaluation 03/22/21    Authorization Type [employer] MEDICARE reporting period from 12/28/2020    Progress Note Due on Visit 10    PT Start Time 1121    PT Stop Time 1207    PT Time Calculation (min) 46 min    Activity Tolerance Patient tolerated treatment well    Behavior During Therapy [facility] for tasks assessed/performed             Past Medical History:  Diagnosis Date   Allergic rhinitis    Arthritis    hip   Borderline high cholesterol    Cataract    Cough variant asthma    Diverticulosis of colon    GERD (gastroesophageal reflux disease)    HTN (hypertension)    Hyperglycemia    borderline DM   OP (osteoporosis)    Seasonal allergies    Sleep apnea    no cpap    Past Surgical History:  Procedure Laterality Date   COLONOSCOPY     COLPOSCOPY     ESOPHAGOGASTRODUODENOSCOPY  10/01   Negative   ROTATOR CUFF REPAIR     right   TUBAL LIGATION      There were no vitals filed for this visit.   Subjective Assessment - 01/18/21 1125     Subjective Patient reports she continues to feel better. She has pain when she turns her head but doesn't notice it during her daily life. It is easier to put her eye drops in. States her HEP is going well including the new exercises. head has been so much better and not hurting any longer.    Pertinent History Patient is a 70 y.o. female who presents to outpatient physical therapy with a referral for medical diagnosis neck pain and stiffness. This patient's chief complaints consist of  cervical spine pain and stiffness, L > R, and head pain leading to the following functional deficits: difficulty with walking for exercise, driving, turning head to view surroundings and be safe, stationary bike, looking up, looking down, reading, grooming, doing hair, lifting, sleeping.  Relevant past medical history and comorbidities include recent shingles, HTN, sleep apnea (untreated), GERD, asthma if she coughs, osteoporosis, diverticulitis (asymptomatic), suspect glaucoma, hx right knee pain, hx R RTC repair.  Patient denies hx of cancer, stroke, seizures, lung problem, major cardiac events, unexplained weight loss, changes in bowel or bladder problems, new onset stumbling or dropping things, spinal surgery.    Limitations Reading;Lifting;Walking;House hold activities;Sitting   walking, driving, turning head to view surroundings and be safe, stationary bike, looking up, looking down, reading, grooming, doing hair, lifting, sleeping.   Diagnostic tests cervical spine radiograph 11/29/2020: "FINDINGS:  Normal alignment. Bones are osteopenic. Normal prevertebral soft  tissues. Degenerative disc disease most severe at C5-6 with marked  disc space narrowing, sclerosis and osteophyte formation. Additional  degenerative changes at C6-7 and multilevel facet arthropathy noted  posteriorly.  Trachea midline.  Lung apices are clear.  IMPRESSION:  Lower cervical degenerative changes as above, most pronounced at  C5-6.  No acute finding or malalignment by plain radiography."    Patient Stated Goals "to have no pain in my neck" "to be able to move it like I always used to"    Currently in Pain? No/denies    Pain Onset More than a month ago              TREATMENT:  Therapeutic exercise: to centralize symptoms and improve ROM, strength, muscular endurance, and activity tolerance required for successful completion of functional activities.  - Upper body ergometer level 3 encourage joint nutrition, warm tissue,  induce analgesic effect of aerobic exercise, improve muscular strength and endurance,  and prepare for remainder of session. 1x5 min changing directions every 1 min.  - standing scapular row, 1x20 with 5# cable, 2x10 with 10# cable and staggered stance.  - standing cervical retraction with self overpressure, standing against wall with half foam roller supporting thoracic spine, 1x20 with 5 second holds.  - Standing cervical thoracic extension/BUE flexion and serratus anterior activation, lat stretch, with foam roller up wall, 5 second holds, 1x20. - Sidelying open book (thoracic rotation) to improve thoracic, shoulder girdle, and upper trunk mobility. Required instruction for technique and cuing to achieve end range as tolerated, hold time, and breathing technique. 2x10 each side with 5 second holds.  (Manual therapy - see below).  - supine chin tuck without pillow, 5 second hold, 1x20 - supine chin tuck to lift without pillow, 2x10, 2-5 second holds.  - supine isometric cervical spine rotation with manual resistance, 5 second hold, 1x10 each side.    Manual therapy: to reduce pain and tissue tension, improve range of motion, neuromodulation, in order to promote improved ability to complete functional activities. SUPINE - STM to posterior cervical spine musculature including bilateral upper traps, cervical paraspinals, scalenes, suboccipital muscles - Occipital-Atlantal (OA) Flexion Mobilization (ipsilateral rotation, contralateral sidebend). 2x10 each direction.  - PROM upper cervical spine rotation with overpressure with neck fully flexed, 2x10 each side, 1-5 second hold.    Pt required multimodal cuing for proper technique and to facilitate improved neuromuscular control, strength, range of motion, and functional ability resulting in improved performance and form.     HOME EXERCISE PROGRAM Access Code: [email or web address] URL: [email or web address] Date: 01/18/2021 Prepared by: Norton Blizzard  Exercises Supine Chin Tuck - 2 x daily - 2 sets - 10 reps - 5 hold Supine Deep Neck Flexor Training - Repetitions - 2 x daily - 2 sets - 10 reps Seated Cervical Rotation AROM - 2 x daily - 2 sets - 10 reps Row with band/cable - 1 x daily - 3 sets - 10 reps - 2 seconds hold Supine Isometric Neck Rotation - 1 x daily - 1 sets - 10 reps - 5 seconds hold Sidelying Thoracic Rotation with Open Book - 1 x daily - 1-2 sets - 10 reps - 5 seconds hold Seated Passive Cervical Retraction - 1-2 x daily - 1 sets - 20 reps   PT Education - 01/18/21 1127     Education Details Exercise purpose/form. Self management techniques    Person(s) Educated Patient    Methods Explanation;Demonstration;Tactile cues;Verbal cues    Comprehension Verbalized understanding;Returned demonstration;Verbal cues required;Tactile cues required;Need further instruction              PT Short Term Goals - 01/06/21 1135       PT SHORT TERM GOAL #1   Title Be independent with initial home exercise program for self-management of symptoms.  Baseline initial HEP provided at IE (12/28/2020);    Time 2    Period Weeks    Status Achieved    Target Date 01/11/21               PT Long Term Goals - 12/28/20 1456       PT LONG TERM GOAL #1   Title Be independent with a long-term home exercise program for self-management of symptoms.    Baseline initial HEP provided at IE (12/28/2020);    Time 12    Period Weeks    Status New   TARGET DATE FOR ALL LONG TERM GOALS: 03/22/2021     PT LONG TERM GOAL #2   Title Demonstrate improved FOTO to equal or greater than 58 by visit #12 to demonstrate improvement in overall condition and self-reported functional ability.    Baseline 42 (12/28/2020);    Time 12    Period Weeks    Status New      PT LONG TERM GOAL #3   Title Reduce pain with functional activities to equal or less than 1/10 to allow patient to complete usual activities including housework, walking,  turning head with less difficulty.    Baseline 5/10 (12/28/2020);    Time 12    Period Weeks    Status New      PT LONG TERM GOAL #4   Title Patient will demonstrate cervical spine AROM rotation to equal or greater than 60 degrees each way to improve ability to check blind spot while driving and view surroundings.    Baseline R 45 pain at base of left neck  L 30 pain at base of left neck (12/28/2020);    Time 12    Period Weeks    Status New      PT LONG TERM GOAL #5   Title Complete community, work and/or recreational activities without limitation due to current condition.    Baseline Functional Limitations: walking for exercise, driving, turning head to view surroundings and be safe, stationary bike, looking up, looking down, reading, grooming, doing hair, lifting, sleeping, housework/cleaning (12/28/2020);    Time 12    Period Weeks    Status New                   Plan - 01/18/21 1143     Clinical Impression Statement Pateint tolerated treatment well overall and continues to report and demonstrate improvement in condition and pain. Today's session continued to focus on postural strengthening and range of motion. Patient continues to have stiff and painful cervical spine rotation. Patient would benefit from continued management of limiting condition by skilled physical therapist to address remaining impairments and functional limitations to work towards stated goals and return to PLOF or maximal functional independence.    Personal Factors and Comorbidities Age;Comorbidity 3+;Past/Current Experience;Time since onset of injury/illness/exacerbation    Comorbidities Relevant past medical history and comorbidities include recent shingles, HTN, sleep apnea (untreated), GERD, asthma if she coughs, osteoporosis, diverticulitis (asymptomatic), suspect glaucoma, hx right knee pain, hx R RTC repair.    Examination-Activity Limitations Bathing;Hygiene/Grooming;Lift;Caring for  Others;Carry;Sleep    Examination-Participation Restrictions Driving;Community Activity;Laundry;Cleaning;Interpersonal Relationship;Other   walking for exercise, driving, turning head to view surroundings and be safe, stationary bike, looking up, looking down, reading, grooming, doing hair, lifting, sleeping, housework/cleaning.   Stability/Clinical Decision Making Evolving/Moderate complexity    Rehab Potential Good    PT Frequency 2x / week    PT Duration 12 weeks  PT Treatment/Interventions ADLs/Self Care Home Management;Cryotherapy;Moist Heat;Therapeutic activities;Therapeutic exercise;Neuromuscular re-education;Manual techniques;Dry needling;Passive range of motion;Patient/family education    PT Next Visit Plan update HEP as appropriate, manual therapy, graded motor control, strengthening, and stretching exercises as tolerated.    PT Home Exercise Plan Medbridge Access Code: [email or web address]    Consulted and Agree with Plan of Care Patient             Patient will benefit from skilled therapeutic intervention in order to improve the following deficits and impairments:  Decreased skin integrity, Pain, Improper body mechanics, Postural dysfunction, Increased muscle spasms, Decreased activity tolerance, Decreased endurance, Decreased range of motion, Decreased strength, Hypomobility, Impaired perceived functional ability, Impaired flexibility  Visit Diagnosis: Cervicalgia  Other muscle spasm  Nonintractable headache, unspecified chronicity pattern, unspecified headache type     Problem List Patient Active Problem List   Diagnosis Date Noted   Headache 12/08/2020   Post herpetic neuralgia 12/02/2020   Scalp pain 12/02/2020   Neck pain 11/20/2020   Insomnia 11/05/2020   Skin lesion 08/05/2020   Right knee pain 11/22/2019   Elevated TSH 12/14/2015   Vitamin D deficiency 12/06/2015   GERD (gastroesophageal reflux disease) 06/09/2014   Encounter for routine gynecological examination  03/03/2014   Estrogen deficiency 03/03/2014   Heartburn 03/01/2013   Gynecological examination 06/22/2010   Special screening for malignant neoplasms, colon 06/22/2010   Glaucoma suspect 06/22/2010   Routine general medical examination at a health care facility 06/03/2010   Sleep apnea    Osteoporosis 05/20/2009   SLEEP APNEA 10/17/2008   COUGH VARIANT ASTHMA 07/30/2008   Leukocytopenia 01/29/2008   Prediabetes 11/27/2007   HYPERCHOLESTEROLEMIA 03/21/2007   Essential hypertension 03/21/2007   Sinusitis, chronic 03/21/2007   Allergic rhinitis 03/21/2007   DIVERTICULOSIS, COLON 03/21/2007    Luretha Murphy. Ilsa Iha, PT, DPT 01/18/21, 12:05 PM   [facility] [address] [place], [address] Phone: [phone]   Fax:  [phone]  Name: Jasmine Rollins MRN: [id number] Date of Birth: Jul 05, 1950

## 2021-01-20 ENCOUNTER — Encounter: Payer: Self-pay | Admitting: Physical Therapy

## 2021-01-20 ENCOUNTER — Ambulatory Visit: Payer: Medicare Other | Admitting: Physical Therapy

## 2021-01-20 DIAGNOSIS — M62838 Other muscle spasm: Secondary | ICD-10-CM | POA: Diagnosis not present

## 2021-01-20 DIAGNOSIS — R519 Headache, unspecified: Secondary | ICD-10-CM

## 2021-01-20 DIAGNOSIS — M542 Cervicalgia: Secondary | ICD-10-CM

## 2021-01-20 NOTE — Therapy (Signed)
[facility] PHYS[facility] AND [facility] MEDICINE [address] [place], [place], [address] Phone: [phone]   Fax:  [phone]  Physical Therapy Treatment  Patient Details  Name: Jasmine Rollins MRN: [id number] Date of Birth: 1950/10/07 Referring Provider (PT): Eula Listen, MD (Orthopedics)   Encounter Date: 01/20/2021   PT End of Session - 01/20/21 1124     Visit Number 8    Number of Visits 24    Date for PT Re-Evaluation 03/22/21    Authorization Type [employer] MEDICARE reporting period from 12/28/2020    Progress Note Due on Visit 10    PT Start Time 1120    PT Stop Time 1205    PT Time Calculation (min) 45 min    Activity Tolerance Patient tolerated treatment well    Behavior During Therapy [facility] for tasks assessed/performed             Past Medical History:  Diagnosis Date   Allergic rhinitis    Arthritis    hip   Borderline high cholesterol    Cataract    Cough variant asthma    Diverticulosis of colon    GERD (gastroesophageal reflux disease)    HTN (hypertension)    Hyperglycemia    borderline DM   OP (osteoporosis)    Seasonal allergies    Sleep apnea    no cpap    Past Surgical History:  Procedure Laterality Date   COLONOSCOPY     COLPOSCOPY     ESOPHAGOGASTRODUODENOSCOPY  10/01   Negative   ROTATOR CUFF REPAIR     right   TUBAL LIGATION      There were no vitals filed for this visit.   Subjective Assessment - 01/20/21 1123     Subjective Patient reports she is feeling well today. She has no pain today but she did have a headache over night after doing her exercise where she lifts her head up. She states this has not happened for a long time.    Pertinent History Patient is a 70 y.o. female who presents to outpatient physical therapy with a referral for medical diagnosis neck pain and stiffness. This patient's chief complaints consist of cervical spine pain and stiffness, L > R, and head pain leading to the  following functional deficits: difficulty with walking for exercise, driving, turning head to view surroundings and be safe, stationary bike, looking up, looking down, reading, grooming, doing hair, lifting, sleeping.  Relevant past medical history and comorbidities include recent shingles, HTN, sleep apnea (untreated), GERD, asthma if she coughs, osteoporosis, diverticulitis (asymptomatic), suspect glaucoma, hx right knee pain, hx R RTC repair.  Patient denies hx of cancer, stroke, seizures, lung problem, major cardiac events, unexplained weight loss, changes in bowel or bladder problems, new onset stumbling or dropping things, spinal surgery.    Limitations Reading;Lifting;Walking;House hold activities;Sitting   walking, driving, turning head to view surroundings and be safe, stationary bike, looking up, looking down, reading, grooming, doing hair, lifting, sleeping.   Diagnostic tests cervical spine radiograph 11/29/2020: "FINDINGS:  Normal alignment. Bones are osteopenic. Normal prevertebral soft  tissues. Degenerative disc disease most severe at C5-6 with marked  disc space narrowing, sclerosis and osteophyte formation. Additional  degenerative changes at C6-7 and multilevel facet arthropathy noted  posteriorly.  Trachea midline.  Lung apices are clear.  IMPRESSION:  Lower cervical degenerative changes as above, most pronounced at  C5-6.  No acute finding or malalignment by plain radiography."    Patient  Stated Goals "to have no pain in my neck" "to be able to move it like I always used to"    Currently in Pain? No/denies    Pain Onset More than a month ago             TREATMENT:  Therapeutic exercise: to centralize symptoms and improve ROM, strength, muscular endurance, and activity tolerance required for successful completion of functional activities.  - Upper body ergometer level 4 encourage joint nutrition, warm tissue, induce analgesic effect of aerobic exercise, improve muscular strength  and endurance,  and prepare for remainder of session. 1x5 min changing directions every 1 min.  - standing scapular row, 3x10 with 10# cable and staggered stance.  - standing cervical retraction with self overpressure, standing against wall with half foam roller supporting thoracic spine, 1x20 with 5 second holds.  - standing cervical retraction with resistance from YTB held in hands at face level (head retracting and elbows extending).  - Standing cervical thoracic extension/BUE flexion and serratus anterior activation, lat stretch, with foam roller up wall, 5 second holds, 1x20. - standing lower trap "Y" shoulder flexion with YTB anchored at chest level. 3x10 - standing UT stretch, 2x30 seconds each side with self overpressure with hand.  - seated rotation cervical SNAG with pillow case, 2x10 each side, 1-5 second hold.  - supine chin tuck without pillow, 5 second hold, 1x20 - supine chin tuck to lift without pillow, 2x10, 1 second holds.  - Sidelying open book (thoracic rotation) to improve thoracic, shoulder girdle, and upper trunk mobility. Required instruction for technique and cuing to achieve end range as tolerated, hold time, and breathing technique. 2x10 each side with 5 second holds.  Pt required multimodal cuing for proper technique and to facilitate improved neuromuscular control, strength, range of motion, and functional ability resulting in improved performance and form.     HOME EXERCISE PROGRAM Access Code: [email or web address] URL: [email or web address] Date: 01/20/2021 Prepared by: Norton Blizzard  Exercises Supine Chin Tuck - 2 x daily - 2 sets - 10 reps - 5 hold Supine Deep Neck Flexor Training - Repetitions - 2 x daily - 2 sets - 10 reps Supine Isometric Neck Rotation - 1 x daily - 1 sets - 10 reps - 5 seconds hold Sidelying Thoracic Rotation with Open Book - 1 x daily - 1-2 sets - 10 reps - 5 seconds hold Row with band/cable - 1 x daily - 3 sets - 10 reps - 2 seconds  hold Seated Assisted Cervical Rotation with Towel - 2 x daily - 2 sets - 10 reps - 5 seconds hold Cervical Retraction with Resistance - 1 x daily - 3 sets - 10 reps   PT Education - 01/20/21 1124     Education Details Exercise purpose/form. Self management techniques    Person(s) Educated Patient    Methods Explanation;Demonstration;Tactile cues;Verbal cues    Comprehension Verbalized understanding;Returned demonstration;Verbal cues required;Tactile cues required;Need further instruction              PT Short Term Goals - 01/06/21 1135       PT SHORT TERM GOAL #1   Title Be independent with initial home exercise program for self-management of symptoms.    Baseline initial HEP provided at IE (12/28/2020);    Time 2    Period Weeks    Status Achieved    Target Date 01/11/21               PT Long Term Goals -  12/28/20 1456       PT LONG TERM GOAL #1   Title Be independent with a long-term home exercise program for self-management of symptoms.    Baseline initial HEP provided at IE (12/28/2020);    Time 12    Period Weeks    Status New   TARGET DATE FOR ALL LONG TERM GOALS: 03/22/2021     PT LONG TERM GOAL #2   Title Demonstrate improved FOTO to equal or greater than 58 by visit #12 to demonstrate improvement in overall condition and self-reported functional ability.    Baseline 42 (12/28/2020);    Time 12    Period Weeks    Status New      PT LONG TERM GOAL #3   Title Reduce pain with functional activities to equal or less than 1/10 to allow patient to complete usual activities including housework, walking, turning head with less difficulty.    Baseline 5/10 (12/28/2020);    Time 12    Period Weeks    Status New      PT LONG TERM GOAL #4   Title Patient will demonstrate cervical spine AROM rotation to equal or greater than 60 degrees each way to improve ability to check blind spot while driving and view surroundings.    Baseline R 45 pain at base of left neck   L 30 pain at base of left neck (12/28/2020);    Time 12    Period Weeks    Status New      PT LONG TERM GOAL #5   Title Complete community, work and/or recreational activities without limitation due to current condition.    Baseline Functional Limitations: walking for exercise, driving, turning head to view surroundings and be safe, stationary bike, looking up, looking down, reading, grooming, doing hair, lifting, sleeping, housework/cleaning (12/28/2020);    Time 12    Period Weeks    Status New                   Plan - 01/20/21 1202     Clinical Impression Statement Patient tolerated well this session and was able to progress postural and mobility exercises for neck and cervical spine. Patient continues to demonstrate stiffness to rotation and sidebending and lack of strength/endurance. Patient would benefit from continued management of limiting condition by skilled physical therapist to address remaining impairments and functional limitations to work towards stated goals and return to PLOF or maximal functional independence.    Personal Factors and Comorbidities Age;Comorbidity 3+;Past/Current Experience;Time since onset of injury/illness/exacerbation    Comorbidities Relevant past medical history and comorbidities include recent shingles, HTN, sleep apnea (untreated), GERD, asthma if she coughs, osteoporosis, diverticulitis (asymptomatic), suspect glaucoma, hx right knee pain, hx R RTC repair.    Examination-Activity Limitations Bathing;Hygiene/Grooming;Lift;Caring for Others;Carry;Sleep    Examination-Participation Restrictions Driving;Community Activity;Laundry;Cleaning;Interpersonal Relationship;Other   walking for exercise, driving, turning head to view surroundings and be safe, stationary bike, looking up, looking down, reading, grooming, doing hair, lifting, sleeping, housework/cleaning.   Stability/Clinical Decision Making Evolving/Moderate complexity    Rehab Potential Good     PT Frequency 2x / week    PT Duration 12 weeks    PT Treatment/Interventions ADLs/Self Care Home Management;Cryotherapy;Moist Heat;Therapeutic activities;Therapeutic exercise;Neuromuscular re-education;Manual techniques;Dry needling;Passive range of motion;Patient/family education    PT Next Visit Plan update HEP as appropriate, manual therapy, graded motor control, strengthening, and stretching exercises as tolerated.    PT Home Exercise Plan Medbridge Access Code: [email or web address]    Consulted  and Agree with Plan of Care Patient             Patient will benefit from skilled therapeutic intervention in order to improve the following deficits and impairments:  Decreased skin integrity, Pain, Improper body mechanics, Postural dysfunction, Increased muscle spasms, Decreased activity tolerance, Decreased endurance, Decreased range of motion, Decreased strength, Hypomobility, Impaired perceived functional ability, Impaired flexibility  Visit Diagnosis: Cervicalgia  Other muscle spasm  Nonintractable headache, unspecified chronicity pattern, unspecified headache type     Problem List Patient Active Problem List   Diagnosis Date Noted   Headache 12/08/2020   Post herpetic neuralgia 12/02/2020   Scalp pain 12/02/2020   Neck pain 11/20/2020   Insomnia 11/05/2020   Skin lesion 08/05/2020   Right knee pain 11/22/2019   Elevated TSH 12/14/2015   Vitamin D deficiency 12/06/2015   GERD (gastroesophageal reflux disease) 06/09/2014   Encounter for routine gynecological examination 03/03/2014   Estrogen deficiency 03/03/2014   Heartburn 03/01/2013   Gynecological examination 06/22/2010   Special screening for malignant neoplasms, colon 06/22/2010   Glaucoma suspect 06/22/2010   Routine general medical examination at a health care facility 06/03/2010   Sleep apnea    Osteoporosis 05/20/2009   SLEEP APNEA 10/17/2008   COUGH VARIANT ASTHMA 07/30/2008   Leukocytopenia 01/29/2008    Prediabetes 11/27/2007   HYPERCHOLESTEROLEMIA 03/21/2007   Essential hypertension 03/21/2007   Sinusitis, chronic 03/21/2007   Allergic rhinitis 03/21/2007   DIVERTICULOSIS, COLON 03/21/2007    Luretha Murphy. Ilsa Iha, PT, DPT 01/20/21, 12:17 PM   [facility] PHYS[facility] AND SPORTS MEDICINE [address] [place], [address] Phone: [phone]   Fax:  [phone]  Name: Jasmine Rollins MRN: [id number] Date of Birth: 03/17/1950

## 2021-01-25 ENCOUNTER — Encounter: Payer: Self-pay | Admitting: Physical Therapy

## 2021-01-25 ENCOUNTER — Ambulatory Visit: Payer: Medicare Other | Admitting: Physical Therapy

## 2021-01-25 DIAGNOSIS — R519 Headache, unspecified: Secondary | ICD-10-CM | POA: Diagnosis not present

## 2021-01-25 DIAGNOSIS — M62838 Other muscle spasm: Secondary | ICD-10-CM | POA: Diagnosis not present

## 2021-01-25 DIAGNOSIS — M542 Cervicalgia: Secondary | ICD-10-CM | POA: Diagnosis not present

## 2021-01-25 NOTE — Therapy (Signed)
[facility] [address] [place], [place], [address] Phone: [phone]   Fax:  [phone]  Physical Therapy Treatment  Patient Details  Name: Jasmine Rollins MRN: [id number] Date of Birth: 06/18/1950 Referring Provider (PT): Eula Listen, MD (Orthopedics)   Encounter Date: 01/25/2021   PT End of Session - 01/25/21 1131     Visit Number 9    Number of Visits 24    Date for PT Re-Evaluation 03/22/21    Authorization Type [employer] MEDICARE reporting period from 12/28/2020    Progress Note Due on Visit 10    PT Start Time 1118    PT Stop Time 1158    PT Time Calculation (min) 40 min    Activity Tolerance Patient tolerated treatment well    Behavior During Therapy WFL for tasks assessed/performed             Past Medical History:  Diagnosis Date   Allergic rhinitis    Arthritis    hip   Borderline high cholesterol    Cataract    Cough variant asthma    Diverticulosis of colon    GERD (gastroesophageal reflux disease)    HTN (hypertension)    Hyperglycemia    borderline DM   OP (osteoporosis)    Seasonal allergies    Sleep apnea    no cpap    Past Surgical History:  Procedure Laterality Date   COLONOSCOPY     COLPOSCOPY     ESOPHAGOGASTRODUODENOSCOPY  10/01   Negative   ROTATOR CUFF REPAIR     right   TUBAL LIGATION      There were no vitals filed for this visit.   Subjective Assessment - 01/25/21 1119     Subjective Patient reports she had pain for 2-3 days after her last PT session. She thinks this may have been from stretching so hard with the towel. She stopped doing any of her HEP and she took advil because it hurt so bad. She thinks maybe the open book exercise bothered her too because her upper back hurt too.    Pertinent History Patient is a 70 y.o. female who presents to outpatient physical therapy with a referral for medical diagnosis neck pain and stiffness. This patient's chief  complaints consist of cervical spine pain and stiffness, L > R, and head pain leading to the following functional deficits: difficulty with walking for exercise, driving, turning head to view surroundings and be safe, stationary bike, looking up, looking down, reading, grooming, doing hair, lifting, sleeping.  Relevant past medical history and comorbidities include recent shingles, HTN, sleep apnea (untreated), GERD, asthma if she coughs, osteoporosis, diverticulitis (asymptomatic), suspect glaucoma, hx right knee pain, hx R RTC repair.  Patient denies hx of cancer, stroke, seizures, lung problem, major cardiac events, unexplained weight loss, changes in bowel or bladder problems, new onset stumbling or dropping things, spinal surgery.    Limitations Reading;Lifting;Walking;House hold activities;Sitting   walking, driving, turning head to view surroundings and be safe, stationary bike, looking up, looking down, reading, grooming, doing hair, lifting, sleeping.   Diagnostic tests cervical spine radiograph 11/29/2020: "FINDINGS:  Normal alignment. Bones are osteopenic. Normal prevertebral soft  tissues. Degenerative disc disease most severe at C5-6 with marked  disc space narrowing, sclerosis and osteophyte formation. Additional  degenerative changes at C6-7 and multilevel facet arthropathy noted  posteriorly.  Trachea midline.  Lung apices are clear.  IMPRESSION:  Lower cervical degenerative changes as above,  most pronounced at  C5-6.  No acute finding or malalignment by plain radiography."    Patient Stated Goals "to have no pain in my neck" "to be able to move it like I always used to"    Currently in Pain? Yes    Pain Score 1    pain only when turning her head   Pain Onset More than a month ago               TREATMENT:  Therapeutic exercise: to centralize symptoms and improve ROM, strength, muscular endurance, and activity tolerance required for successful completion of functional activities.  -  Upper body ergometer level 4 encourage joint nutrition, warm tissue, induce analgesic effect of aerobic exercise, improve muscular strength and endurance,  and prepare for remainder of session. 1x5 min changing directions every 1 min.  - standing scapular row, 3x10 with 10# cable and staggered stance.  - standing cervical retraction with resistance from YTB held in hands at face level (head retracting), 2x10. - Standing cervical thoracic extension/BUE flexion and serratus anterior activation, lat stretch, with foam roller up wall, 5 second holds, 1x20. - standing lower trap "Y" shoulder flexion with YTB anchored at chest level. 2x10 - standing UT stretch, 2x30 seconds each side with self overpressure with hand.  - seated rotation cervical SNAG with pillow case, 1x10 each side, 1-5 second hold.  - seated isometric cervical spine rotation with manual resistance, 5 second hold, 1x10 each side.  - supine chin tuck without pillow, 10 second hold, 1x10 - supine chin tuck to lift without pillow, 1x10, 1 second holds.  - Sidelying open book (thoracic rotation) to improve thoracic, shoulder girdle, and upper trunk mobility. Required instruction for technique and cuing to achieve end range as tolerated, hold time, and breathing technique. 1x10 each side with 1-5 second holds.   Pt required multimodal cuing for proper technique and to facilitate improved neuromuscular control, strength, range of motion, and functional ability resulting in improved performance and form.     HOME EXERCISE PROGRAM Access Code: [email or web address] URL: [email or web address] Date: 01/20/2021 Prepared by: Norton Blizzard   Exercises Supine Chin Tuck - 2 x daily - 2 sets - 10 reps - 5 hold Supine Deep Neck Flexor Training - Repetitions - 2 x daily - 2 sets - 10 reps Supine Isometric Neck Rotation - 1 x daily - 1 sets - 10 reps - 5 seconds hold Sidelying Thoracic Rotation with Open Book - 1 x daily - 1-2 sets - 10 reps - 5  seconds hold Row with band/cable - 1 x daily - 3 sets - 10 reps - 2 seconds hold Seated Assisted Cervical Rotation with Towel - 2 x daily - 2 sets - 10 reps - 5 seconds hold Cervical Retraction with Resistance - 1 x daily - 3 sets - 10 reps     PT Education - 01/25/21 1131     Education Details Exercise purpose/form. Self management techniques    Person(s) Educated Patient    Methods Explanation;Demonstration;Tactile cues;Verbal cues    Comprehension Verbalized understanding;Returned demonstration;Verbal cues required;Tactile cues required;Need further instruction              PT Short Term Goals - 01/06/21 1135       PT SHORT TERM GOAL #1   Title Be independent with initial home exercise program for self-management of symptoms.    Baseline initial HEP provided at IE (12/28/2020);    Time 2    Period Weeks  Status Achieved    Target Date 01/11/21               PT Long Term Goals - 12/28/20 1456       PT LONG TERM GOAL #1   Title Be independent with a long-term home exercise program for self-management of symptoms.    Baseline initial HEP provided at IE (12/28/2020);    Time 12    Period Weeks    Status New   TARGET DATE FOR ALL LONG TERM GOALS: 03/22/2021     PT LONG TERM GOAL #2   Title Demonstrate improved FOTO to equal or greater than 58 by visit #12 to demonstrate improvement in overall condition and self-reported functional ability.    Baseline 42 (12/28/2020);    Time 12    Period Weeks    Status New      PT LONG TERM GOAL #3   Title Reduce pain with functional activities to equal or less than 1/10 to allow patient to complete usual activities including housework, walking, turning head with less difficulty.    Baseline 5/10 (12/28/2020);    Time 12    Period Weeks    Status New      PT LONG TERM GOAL #4   Title Patient will demonstrate cervical spine AROM rotation to equal or greater than 60 degrees each way to improve ability to check blind spot  while driving and view surroundings.    Baseline R 45 pain at base of left neck  L 30 pain at base of left neck (12/28/2020);    Time 12    Period Weeks    Status New      PT LONG TERM GOAL #5   Title Complete community, work and/or recreational activities without limitation due to current condition.    Baseline Functional Limitations: walking for exercise, driving, turning head to view surroundings and be safe, stationary bike, looking up, looking down, reading, grooming, doing hair, lifting, sleeping, housework/cleaning (12/28/2020);    Time 12    Period Weeks    Status New                   Plan - 01/25/21 1159     Clinical Impression Statement Patient tolerated treatment well overall and had not increase in pain by end of session. She did feel some pain in her neck and head during some exercises that resolved with rest. Exercise intensity and volume was reduced this session compared to last to decrease likelihood of irritation of condition. Patient would benefit from continued management of limiting condition by skilled physical therapist to address remaining impairments and functional limitations to work towards stated goals and return to PLOF or maximal functional independence.    Personal Factors and Comorbidities Age;Comorbidity 3+;Past/Current Experience;Time since onset of injury/illness/exacerbation    Comorbidities Relevant past medical history and comorbidities include recent shingles, HTN, sleep apnea (untreated), GERD, asthma if she coughs, osteoporosis, diverticulitis (asymptomatic), suspect glaucoma, hx right knee pain, hx R RTC repair.    Examination-Activity Limitations Bathing;Hygiene/Grooming;Lift;Caring for Others;Carry;Sleep    Examination-Participation Restrictions Driving;Community Activity;Laundry;Cleaning;Interpersonal Relationship;Other   walking for exercise, driving, turning head to view surroundings and be safe, stationary bike, looking up, looking down,  reading, grooming, doing hair, lifting, sleeping, housework/cleaning.   Stability/Clinical Decision Making Evolving/Moderate complexity    Rehab Potential Good    PT Frequency 2x / week    PT Duration 12 weeks    PT Treatment/Interventions ADLs/Self Care Home Management;Cryotherapy;Moist Heat;Therapeutic activities;Therapeutic  exercise;Neuromuscular re-education;Manual techniques;Dry needling;Passive range of motion;Patient/family education    PT Next Visit Plan update HEP as appropriate, manual therapy, graded motor control, strengthening, and stretching exercises as tolerated.    PT Home Exercise Plan Medbridge Access Code: [email or web address]    Consulted and Agree with Plan of Care Patient             Patient will benefit from skilled therapeutic intervention in order to improve the following deficits and impairments:  Decreased skin integrity, Pain, Improper body mechanics, Postural dysfunction, Increased muscle spasms, Decreased activity tolerance, Decreased endurance, Decreased range of motion, Decreased strength, Hypomobility, Impaired perceived functional ability, Impaired flexibility  Visit Diagnosis: Cervicalgia  Other muscle spasm  Nonintractable headache, unspecified chronicity pattern, unspecified headache type     Problem List Patient Active Problem List   Diagnosis Date Noted   Headache 12/08/2020   Post herpetic neuralgia 12/02/2020   Scalp pain 12/02/2020   Neck pain 11/20/2020   Insomnia 11/05/2020   Skin lesion 08/05/2020   Right knee pain 11/22/2019   Elevated TSH 12/14/2015   Vitamin D deficiency 12/06/2015   GERD (gastroesophageal reflux disease) 06/09/2014   Encounter for routine gynecological examination 03/03/2014   Estrogen deficiency 03/03/2014   Heartburn 03/01/2013   Gynecological examination 06/22/2010   Special screening for malignant neoplasms, colon 06/22/2010   Glaucoma suspect 06/22/2010   Routine general medical examination at a health care  facility 06/03/2010   Sleep apnea    Osteoporosis 05/20/2009   SLEEP APNEA 10/17/2008   COUGH VARIANT ASTHMA 07/30/2008   Leukocytopenia 01/29/2008   Prediabetes 11/27/2007   HYPERCHOLESTEROLEMIA 03/21/2007   Essential hypertension 03/21/2007   Sinusitis, chronic 03/21/2007   Allergic rhinitis 03/21/2007   DIVERTICULOSIS, COLON 03/21/2007    Luretha Murphy. Ilsa Iha, PT, DPT 01/25/21, 12:00 PM   [place] [facility] PH[facility] SPOR[facility] MEDIC[facility] [address] [place], [address] Phone: [phone]   Fax:  [phone]  Name: Jasmine Rollins MRN: [id number] Date of Birth: 06-29-1950

## 2021-01-27 ENCOUNTER — Ambulatory Visit: Payer: Medicare Other | Admitting: Physical Therapy

## 2021-01-29 DIAGNOSIS — M79672 Pain in left foot: Secondary | ICD-10-CM | POA: Diagnosis not present

## 2021-02-01 ENCOUNTER — Encounter: Payer: Self-pay | Admitting: Physical Therapy

## 2021-02-01 ENCOUNTER — Ambulatory Visit: Payer: Medicare Other | Admitting: Physical Therapy

## 2021-02-01 DIAGNOSIS — M62838 Other muscle spasm: Secondary | ICD-10-CM

## 2021-02-01 DIAGNOSIS — M542 Cervicalgia: Secondary | ICD-10-CM

## 2021-02-01 DIAGNOSIS — R519 Headache, unspecified: Secondary | ICD-10-CM | POA: Diagnosis not present

## 2021-02-01 NOTE — Therapy (Signed)
[facility] [address], [place], [address] Phone: [phone]   Fax:  [phone]  Physical Therapy Treatment / Discharge Summary Dates of reporting from 12/28/2020 to 02/01/2021  Patient Details  Name: Jasmine Rollins MRN: [id number] Date of Birth: 1950-04-05 Referring Provider (PT): Rhina Brackett, MD (Orthopedics)   Encounter Date: 02/01/2021   PT End of Session - 02/01/21 1127     Visit Number 10    Number of Visits 24    Date for PT Re-Evaluation 03/22/21    Authorization Type [employer] MEDICARE reporting period from 12/28/2020    Progress Note Due on Visit 10    PT Start Time 1117    PT Stop Time 1157    PT Time Calculation (min) 40 min    Activity Tolerance Patient tolerated treatment well    Behavior During Therapy [facility] for tasks assessed/performed             Past Medical History:  Diagnosis Date   Allergic rhinitis    Arthritis    hip   Borderline high cholesterol    Cataract    Cough variant asthma    Diverticulosis of colon    GERD (gastroesophageal reflux disease)    HTN (hypertension)    Hyperglycemia    borderline DM   OP (osteoporosis)    Seasonal allergies    Sleep apnea    no cpap    Past Surgical History:  Procedure Laterality Date   COLONOSCOPY     COLPOSCOPY     ESOPHAGOGASTRODUODENOSCOPY  10/01   Negative   ROTATOR CUFF REPAIR     right   TUBAL LIGATION      There were no vitals filed for this visit.   Subjective Assessment - 02/01/21 1122     Subjective Patient reports she is feeling well today. She states she has no neck pain but it is stiff especially when she turns to the left. She felt okay after last PT session and was able to do her HEP on Wed and Thursday with a more gentle appreach. She awoke with L heel pain on Thursday and it did not get better the next day and she ended up going to the MD for it who gave her a shot in the heel for plantar faciitis.  Patient reports her neck is "so much better" since staring PT. She can move her head up and down to put in eye drops and look up. She can shake her head now and turn to side to side. she has returned to driving and she can check her blind spot. She states people at church have noticed it is better. She still has a little stiffness turning to the left. She feels like she is ready to discharge from PT.    Pertinent History Patient is a 70 y.o. female who presents to outpatient physical therapy with a referral for medical diagnosis neck pain and stiffness. This patient's chief complaints consist of cervical spine pain and stiffness, L > R, and head pain leading to the following functional deficits: difficulty with walking for exercise, driving, turning head to view surroundings and be safe, stationary bike, looking up, looking down, reading, grooming, doing hair, lifting, sleeping.  Relevant past medical history and comorbidities include recent shingles, HTN, sleep apnea (untreated), GERD, asthma if she coughs, osteoporosis, diverticulitis (asymptomatic), suspect glaucoma, hx right knee pain, hx R RTC repair.  Patient denies hx of cancer, stroke, seizures, lung  problem, major cardiac events, unexplained weight loss, changes in bowel or bladder problems, new onset stumbling or dropping things, spinal surgery.    Limitations Reading;Lifting;Walking;House hold activities;Sitting   walking, driving, turning head to view surroundings and be safe, stationary bike, looking up, looking down, reading, grooming, doing hair, lifting, sleeping.   Diagnostic tests cervical spine radiograph 11/29/2020: "FINDINGS:  Normal alignment. Bones are osteopenic. Normal prevertebral soft  tissues. Degenerative disc disease most severe at C5-6 with marked  disc space narrowing, sclerosis and osteophyte formation. Additional  degenerative changes at C6-7 and multilevel facet arthropathy noted  posteriorly.  Trachea midline.  Lung apices are  clear.  IMPRESSION:  Lower cervical degenerative changes as above, most pronounced at  C5-6.  No acute finding or malalignment by plain radiography."    Patient Stated Goals "to have no pain in my neck" "to be able to move it like I always used to"    Currently in Pain? No/denies    Pain Onset More than a month ago    Effect of Pain on Daily Activities everything is much better.              OBJECTIVE  SELF-REPORTED FUNCTION FOTO score: 74/100 (neck questionnaire)   SPINE MOTION Cervical Spine AROM *Indicates pain Flexion: 43  Extension: 50  Side Flexion:        R 25        L 25  Rotation:  R 50  L 48    TREATMENT:   Therapeutic exercise: to centralize symptoms and improve ROM, strength, muscular endurance, and activity tolerance required for successful completion of functional activities.  - Upper body ergometer level 4 encourage joint nutrition, warm tissue, induce analgesic effect of aerobic exercise, improve muscular strength and endurance,  and prepare for remainder of session. 1x5 min changing directions every 1 min.  - measurements to assess progress - see above - standing scapular row, 3x10 with 10# cable and staggered stance.  - standing lower trap "Y" shoulder flexion with YTB anchored at chest level. 2x10 - standing cervical retraction with resistance from YTB held in hands at face level (head retracting), 2x10. - seated rotation cervical SNAG with pillow case, 1x10 each side, 1-5 second hold.  - seated isometric cervical spine rotation with manual resistance, 5 second hold, 1x10 each side.  - supine chin tuck without pillow, 5-10 second hold, 1x15 - supine chin tuck to lift without pillow, 1x10, 1 second holds. (improved form).    Pt required multimodal cuing for proper technique and to facilitate improved neuromuscular control, strength, range of motion, and functional ability resulting in improved performance and form.     HOME EXERCISE PROGRAM Access  Code: [email or web address] URL: [email or web address] Date: 02/01/2021 Prepared by: Rosita Kea  Exercises Supine Chin Tuck - 2 x daily - 2 sets - 10 reps - 10 hold Supine Deep Neck Flexor Training - Repetitions - 2 x daily - 2 sets - 10 reps Supine Isometric Neck Rotation - 1 x daily - 1 sets - 10 reps - 5 seconds hold Sidelying Thoracic Rotation with Open Book - 1 x daily - 1-2 sets - 10 reps - 5 seconds hold Row with band/cable - 1 x daily - 3 sets - 10 reps - 2 seconds hold Seated Assisted Cervical Rotation with Towel - 2 x daily - 2 sets - 10 reps - 5 seconds hold Cervical Retraction with Resistance - 1 x daily - 3 sets - 10 reps Shoulder Flexion with Anterior  Anchored Resistance - 1 x daily - 3 sets - 10 reps   PT Education - 02/01/21 1126     Education Details Exercise purpose/form. Self management techniques. POC, discharge reccomendations.    Person(s) Educated Patient    Methods Explanation;Demonstration    Comprehension Verbalized understanding;Returned demonstration              PT Short Term Goals - 01/06/21 1135       PT SHORT TERM GOAL #1   Title Be independent with initial home exercise program for self-management of symptoms.    Baseline initial HEP provided at IE (12/28/2020);    Time 2    Period Weeks    Status Achieved    Target Date 01/11/21               PT Long Term Goals - 02/01/21 1127       PT LONG TERM GOAL #1   Title Be independent with a long-term home exercise program for self-management of symptoms.    Baseline initial HEP provided at IE (12/28/2020);    Time 12    Period Weeks    Status Achieved   TARGET DATE FOR ALL LONG TERM GOALS: 03/22/2021     PT LONG TERM GOAL #2   Title Demonstrate improved FOTO to equal or greater than 58 by visit #12 to demonstrate improvement in overall condition and self-reported functional ability.    Baseline 42 (12/28/2020); 74 at visit #10 (02/01/2021);    Time 12    Period Weeks    Status  Achieved      PT LONG TERM GOAL #3   Title Reduce pain with functional activities to equal or less than 1/10 to allow patient to complete usual activities including housework, walking, turning head with less difficulty.    Baseline 5/10 (12/28/2020); 1/10 or less in the last 2 weeks with no meds (02/01/2021);    Time 12    Period Weeks    Status Achieved      PT LONG TERM GOAL #4   Title Patient will demonstrate cervical spine AROM rotation to equal or greater than 60 degrees each way to improve ability to check blind spot while driving and view surroundings.    Baseline R 45 pain at base of left neck  L 30 pain at base of left neck (12/28/2020); R 50, L 48 (02/01/2021);    Time 12    Period Weeks    Status Partially Met      PT LONG TERM GOAL #5   Title Complete community, work and/or recreational activities without limitation due to current condition.    Baseline Functional Limitations: walking for exercise, driving, turning head to view surroundings and be safe, stationary bike, looking up, looking down, reading, grooming, doing hair, lifting, sleeping, housework/cleaning (12/28/2020);    Time 12    Period Weeks    Status Achieved                   Plan - 02/01/21 1144     Clinical Impression Statement Pateint has attended 10 physical therapy sessions since starting this episode of care on 12/28/2020. She has shown steady improvement and now appears ready for discharge due to improvement and independence with HEP.  She has met all of her goals except AROM rotation, where she has improved significantly and will likely continue to improve with consistent performance of HEP. Patient is now discharged from PT to independent HEP.    Personal  Factors and Comorbidities Age;Comorbidity 3+;Past/Current Experience;Time since onset of injury/illness/exacerbation    Comorbidities Relevant past medical history and comorbidities include recent shingles, HTN, sleep apnea (untreated), GERD,  asthma if she coughs, osteoporosis, diverticulitis (asymptomatic), suspect glaucoma, hx right knee pain, hx R RTC repair.    Examination-Activity Limitations Bathing;Hygiene/Grooming;Lift;Caring for Others;Carry;Sleep    Examination-Participation Restrictions Driving;Community Activity;Laundry;Cleaning;Interpersonal Relationship;Other   walking for exercise, driving, turning head to view surroundings and be safe, stationary bike, looking up, looking down, reading, grooming, doing hair, lifting, sleeping, housework/cleaning.   Stability/Clinical Decision Making Evolving/Moderate complexity    Rehab Potential Good    PT Frequency 2x / week    PT Duration 12 weeks    PT Treatment/Interventions ADLs/Self Care Home Management;Cryotherapy;Moist Heat;Therapeutic activities;Therapeutic exercise;Neuromuscular re-education;Manual techniques;Dry needling;Passive range of motion;Patient/family education    PT Next Visit Plan patient is now discharged from PT due to improvement in symptoms    PT Home Exercise Plan Medbridge Access Code: [email or web address]    Consulted and Agree with Plan of Care Patient             Patient will benefit from skilled therapeutic intervention in order to improve the following deficits and impairments:  Decreased skin integrity, Pain, Improper body mechanics, Postural dysfunction, Increased muscle spasms, Decreased activity tolerance, Decreased endurance, Decreased range of motion, Decreased strength, Hypomobility, Impaired perceived functional ability, Impaired flexibility  Visit Diagnosis: Cervicalgia  Other muscle spasm  Nonintractable headache, unspecified chronicity pattern, unspecified headache type     Problem List Patient Active Problem List   Diagnosis Date Noted   Headache 12/08/2020   Post herpetic neuralgia 12/02/2020   Scalp pain 12/02/2020   Neck pain 11/20/2020   Insomnia 11/05/2020   Skin lesion 08/05/2020   Right knee pain 11/22/2019   Elevated TSH  12/14/2015   Vitamin D deficiency 12/06/2015   GERD (gastroesophageal reflux disease) 06/09/2014   Encounter for routine gynecological examination 03/03/2014   Estrogen deficiency 03/03/2014   Heartburn 03/01/2013   Gynecological examination 06/22/2010   Special screening for malignant neoplasms, colon 06/22/2010   Glaucoma suspect 06/22/2010   Routine general medical examination at a health care facility 06/03/2010   Sleep apnea    Osteoporosis 05/20/2009   SLEEP APNEA 10/17/2008   COUGH VARIANT ASTHMA 07/30/2008   Leukocytopenia 01/29/2008   Prediabetes 11/27/2007   HYPERCHOLESTEROLEMIA 03/21/2007   Essential hypertension 03/21/2007   Sinusitis, chronic 03/21/2007   Allergic rhinitis 03/21/2007   DIVERTICULOSIS, COLON 03/21/2007    Everlean Alstrom. Graylon Good, PT, DPT 02/01/21, 12:01 PM   [facility] [address], [place], [address] Phone: [phone]   Fax:  [phone]  Name: Jasmine Rollins MRN: [id number] Date of Birth: 1950-10-01

## 2021-02-03 ENCOUNTER — Ambulatory Visit: Payer: Medicare Other | Admitting: Physical Therapy

## 2021-02-09 ENCOUNTER — Ambulatory Visit: Payer: Medicare Other | Admitting: Physical Therapy

## 2021-03-23 ENCOUNTER — Other Ambulatory Visit: Payer: Self-pay | Admitting: Family Medicine

## 2021-03-23 DIAGNOSIS — Z1231 Encounter for screening mammogram for malignant neoplasm of breast: Secondary | ICD-10-CM

## 2021-04-07 ENCOUNTER — Ambulatory Visit
Admission: RE | Admit: 2021-04-07 | Discharge: 2021-04-07 | Disposition: A | Discharge status: Home / Self Care | Payer: Medicare Other | Source: Ambulatory Visit | Attending: Family Medicine | Admitting: Family Medicine

## 2021-04-07 DIAGNOSIS — Z1231 Encounter for screening mammogram for malignant neoplasm of breast: Secondary | ICD-10-CM

## 2021-04-15 DIAGNOSIS — H40033 Anatomical narrow angle, bilateral: Secondary | ICD-10-CM | POA: Diagnosis not present

## 2021-04-15 DIAGNOSIS — H04123 Dry eye syndrome of bilateral lacrimal glands: Secondary | ICD-10-CM | POA: Diagnosis not present

## 2021-04-15 DIAGNOSIS — H401131 Primary open-angle glaucoma, bilateral, mild stage: Secondary | ICD-10-CM | POA: Diagnosis not present

## 2021-04-21 ENCOUNTER — Telehealth: Payer: Self-pay

## 2021-04-21 NOTE — Chronic Care Management (AMB) (Signed)
Transition CCM to Self Care ? ?Patient contacted to inform they have achieved their CCM goals and no longer need to be contacted as frequently. Patient advised services will still be available to them if they would like to reach out or have any new health concerns. Verified patient had contact information to pharmacist and health concierge on hand. Patient made aware CCM services would be continued if desired. Patient consented to cancel future CCM appointments.  ? ?Lindsey Foltanski, CPP notified ? ?Abram Sax, CCMA ?Health concierge  ?[phone]  ?

## 2021-05-25 ENCOUNTER — Other Ambulatory Visit (HOSPITAL_BASED_OUTPATIENT_CLINIC_OR_DEPARTMENT_OTHER): Payer: Self-pay

## 2021-05-25 ENCOUNTER — Ambulatory Visit: Payer: Medicare Other | Attending: Internal Medicine

## 2021-05-25 DIAGNOSIS — Z23 Encounter for immunization: Secondary | ICD-10-CM

## 2021-05-25 MED ORDER — PFIZER COVID-19 VAC BIVALENT 30 MCG/0.3ML IM SUSP
INTRAMUSCULAR | 0 refills | Status: DC
  Filled 2021-05-25: qty 0.3, 1d supply, fill #0

## 2021-05-25 NOTE — Progress Notes (Signed)
? ?  Covid-19 Vaccination Clinic ? ?Name:  Jasmine Rollins    ?MRN: [id number] ?DOB: 1950-08-06 ? ?05/25/2021 ? ?Ms. Nebergall was observed post Covid-19 immunization for 15 minutes without incident. She was provided with Vaccine Information Sheet and instruction to access the V-Safe system.  ? ?Ms. Bonsignore was instructed to call 911 with any severe reactions post vaccine: ?Difficulty breathing  ?Swelling of face and throat  ?A fast heartbeat  ?A bad rash all over body  ?Dizziness and weakness  ? ?Immunizations Administered   ? ? Name Date Dose VIS Date Route  ? [employer], [employer] 05/25/2021  2:37 PM 0.3 mL 10/14/2020 Intramuscular  ? Manufacturer: [employer]  ? Lot: [id number]  ? NDC: [phone]  ? ?  ? ? ?

## 2021-06-22 ENCOUNTER — Telehealth: Payer: Medicare Other

## 2021-07-01 ENCOUNTER — Ambulatory Visit (INDEPENDENT_AMBULATORY_CARE_PROVIDER_SITE_OTHER): Payer: Medicare Other | Admitting: Family Medicine

## 2021-07-01 ENCOUNTER — Encounter: Payer: Self-pay | Admitting: Family Medicine

## 2021-07-01 VITALS — BP 138/86 | HR 75 | Temp 97.2°F | Ht 62.0 in | Wt 132.4 lb

## 2021-07-01 DIAGNOSIS — J329 Chronic sinusitis, unspecified: Secondary | ICD-10-CM

## 2021-07-01 MED ORDER — PREDNISONE 10 MG PO TABS: ORAL_TABLET | ORAL | 0 refills | Status: DC

## 2021-07-01 NOTE — Assessment & Plan Note (Signed)
Exacerbation of sinus congestion and inflammation  Had dizziness/suspect due to ETD Had been off flonase for a while   Reassuring exam Px prednisone 30 mg taper  Rev poss side eff Will use saline/re start flonase Update if not starting to improve in a week or if worsening  Rev s/s of bacterial sinusitis to watch for

## 2021-07-01 NOTE — Patient Instructions (Addendum)
Take the prednisone for congestion and sinus inflammation   This may be from allergies or a cold  If you develop signs of a bacterial sinus infection (green/thick nasal discharge and facial pain and fever)   Use your nasal saline  Afrin - 2 days maximum   Get back on flonase every day/once daily

## 2021-07-01 NOTE — Progress Notes (Signed)
Subjective:    Patient ID: Jasmine Rollins, female    DOB: [birth year], 71 y.o.   MRN: [id number]  HPI Pt presents with sinus symptoms   Wt Readings from Last 3 Encounters:  07/01/21 132 lb 6 oz (60 kg)  12/08/20 134 lb (60.8 kg)  12/02/20 131 lb 4 oz (59.5 kg)   24.21 kg/m   Dizziness -after leaving church/ a little nausea  Then noticed she was congested  Had been off flonase for a while   She used afrin once and it really helped   Not really sinus pain  Had a headache across forehead - is some better but not gone   Nasal d/c is clear   No cough  No fever or aches   Now a little light headed /not really dizzy    Allergic to augmentin and sufa   Patient Active Problem List   Diagnosis Date Noted   Headache 12/08/2020   Post herpetic neuralgia 12/02/2020   Scalp pain 12/02/2020   Neck pain 11/20/2020   Insomnia 11/05/2020   Skin lesion 08/05/2020   Right knee pain 11/22/2019   Elevated TSH 12/14/2015   Vitamin D deficiency 12/06/2015   GERD (gastroesophageal reflux disease) 06/09/2014   Encounter for routine gynecological examination 03/03/2014   Estrogen deficiency 03/03/2014   Heartburn 03/01/2013   Gynecological examination 06/22/2010   Special screening for malignant neoplasms, colon 06/22/2010   Glaucoma suspect 06/22/2010   Routine general medical examination at a health care facility 06/03/2010   Sleep apnea    Osteoporosis 05/20/2009   SLEEP APNEA 10/17/2008   COUGH VARIANT ASTHMA 07/30/2008   Leukocytopenia 01/29/2008   Prediabetes 11/27/2007   HYPERCHOLESTEROLEMIA 03/21/2007   Essential hypertension 03/21/2007   Sinusitis, chronic 03/21/2007   Allergic rhinitis 03/21/2007   DIVERTICULOSIS, COLON 03/21/2007   Past Medical History:  Diagnosis Date   Allergic rhinitis    Arthritis    hip   Borderline high cholesterol    Cataract    Cough variant asthma    Diverticulosis of colon    GERD (gastroesophageal reflux disease)    HTN  (hypertension)    Hyperglycemia    borderline DM   OP (osteoporosis)    Seasonal allergies    Sleep apnea    no cpap   Past Surgical History:  Procedure Laterality Date   COLONOSCOPY     COLPOSCOPY     ESOPHAGOGASTRODUODENOSCOPY  10/01   Negative   ROTATOR CUFF REPAIR     right   TUBAL LIGATION     Social History   Tobacco Use   Smoking status: Never   Smokeless tobacco: Never   Tobacco comments:    Remote 2nd hand exposure  Vaping Use   Vaping Use: Never used  Substance Use Topics   Alcohol use: No    Alcohol/week: 0.0 standard drinks   Drug use: No   Family History  Problem Relation Age of Onset   Hypertension Father    Diabetes Father    Diabetes Brother    Diabetes Brother    Cancer Brother        prostate   Cancer Brother        prostate   Cancer Brother        prostate   Cancer Brother        prostate   Cancer Brother        prostate   Cancer Brother        prostate  Breast cancer Maternal Aunt    Colon cancer Neg Hx    Allergies  Allergen Reactions   Augmentin [Amoxicillin-Pot Clavulanate]     GI upset    Sulfonamide Derivatives Rash   Current Outpatient Medications on File Prior to Visit  Medication Sig Dispense Refill   albuterol (PROVENTIL HFA) 108 (90 Base) MCG/ACT inhaler INHALE 2 PUFFS EVERY 4 HOURS AS NEEDED 18 g 3   alendronate (FOSAMAX) 70 MG tablet Take 1 tablet (70 mg total) by mouth every 7 (seven) days. Take with a full glass of water on an empty stomach. 12 tablet 3   amLODipine (NORVASC) 5 MG tablet Take 1 tablet (5 mg total) by mouth daily. 90 tablet 3   Calcium Carb-Cholecalciferol 600-800 MG-UNIT TABS Take by mouth.     fluticasone (FLONASE) 50 MCG/ACT nasal spray Place 2 sprays into both nostrils daily. 48 g 3   fluticasone (FLOVENT HFA) 44 MCG/ACT inhaler USE 2 INHALATIONS TWICE A DAY (RINSE MOUTH AFTER USE) 31.8 g 3   glucose blood (FREESTYLE LITE) test strip To check glucose once daily (dx. R73.03) 100 each 1    latanoprost (XALATAN) 0.005 % ophthalmic solution Place 1 drop into both eyes at bedtime.      montelukast (SINGULAIR) 10 MG tablet Take 1 tablet (10 mg total) by mouth at bedtime. 90 tablet 3   olmesartan-hydrochlorothiazide (BENICAR HCT) 40-12.5 MG tablet Take 1 tablet by mouth daily. 90 tablet 3   No current facility-administered medications on file prior to visit.     Review of Systems  Constitutional:  Negative for activity change, appetite change, fatigue, fever and unexpected weight change.  HENT:  Positive for congestion and sinus pressure. Negative for ear pain, postnasal drip, rhinorrhea, sore throat, trouble swallowing and voice change.   Eyes:  Negative for pain, redness and visual disturbance.  Respiratory:  Negative for cough, shortness of breath and wheezing.   Cardiovascular:  Negative for chest pain and palpitations.  Gastrointestinal:  Positive for nausea. Negative for abdominal pain, blood in stool, constipation and diarrhea.  Endocrine: Negative for polydipsia and polyuria.  Genitourinary:  Negative for dysuria, frequency and urgency.  Musculoskeletal:  Negative for arthralgias, back pain and myalgias.  Skin:  Negative for pallor and rash.  Allergic/Immunologic: Negative for environmental allergies.  Neurological:  Positive for dizziness. Negative for syncope and headaches.  Hematological:  Negative for adenopathy. Does not bruise/bleed easily.  Psychiatric/Behavioral:  Negative for decreased concentration and dysphoric mood. The patient is not nervous/anxious.       Objective:   Physical Exam Constitutional:      General: She is not in acute distress.    Appearance: Normal appearance. She is well-developed and normal weight. She is not ill-appearing.  HENT:     Head: Normocephalic and atraumatic.     Comments: Nares are injected and congested  Clear pnd   Maxillary sinus tenderness worse on the L    Right Ear: Tympanic membrane, ear canal and external ear  normal.     Left Ear: Tympanic membrane, ear canal and external ear normal.     Ears:     Comments: TMS are dull but not bulging or injected     Nose: Congestion and rhinorrhea present.     Mouth/Throat:     Mouth: Mucous membranes are moist.     Pharynx: No oropharyngeal exudate or posterior oropharyngeal erythema.     Comments: Clear pnd Eyes:     General:  Right eye: No discharge.        Left eye: No discharge.     Conjunctiva/sclera: Conjunctivae normal.     Pupils: Pupils are equal, round, and reactive to light.  Cardiovascular:     Rate and Rhythm: Normal rate and regular rhythm.  Pulmonary:     Effort: Pulmonary effort is normal. No respiratory distress.     Breath sounds: Normal breath sounds. No stridor. No wheezing, rhonchi or rales.  Musculoskeletal:     Cervical back: Normal range of motion and neck supple.  Lymphadenopathy:     Cervical: No cervical adenopathy.  Skin:    General: Skin is warm and dry.     Findings: No rash.  Neurological:     Mental Status: She is alert.     Cranial Nerves: No cranial nerve deficit.  Psychiatric:        Mood and Affect: Mood normal.          Assessment & Plan:   Problem List Items Addressed This Visit       Respiratory   Sinusitis, chronic - Primary    Exacerbation of sinus congestion and inflammation  Had dizziness/suspect due to ETD Had been off flonase for a while   Reassuring exam Px prednisone 30 mg taper  Rev poss side eff Will use saline/re start flonase Update if not starting to improve in a week or if worsening  Rev s/s of bacterial sinusitis to watch for        Relevant Medications   predniSONE (DELTASONE) 10 MG tablet

## 2021-07-08 ENCOUNTER — Encounter: Payer: Self-pay | Admitting: Family Medicine

## 2021-07-22 ENCOUNTER — Other Ambulatory Visit: Payer: Self-pay

## 2021-07-22 ENCOUNTER — Telehealth: Payer: Self-pay

## 2021-07-22 MED ORDER — OLMESARTAN MEDOXOMIL-HCTZ 40-12.5 MG PO TABS: 1.0000 | ORAL_TABLET | Freq: Every day | ORAL | 3 refills | Status: DC

## 2021-07-22 NOTE — Telephone Encounter (Signed)
MEDICATION: olmesartan-hydrochlorothiazide (BENICAR HCT) 40-12.5 MG tablet // montelukast (SINGULAIR) 10 MG tablet //   PHARMACY: [facility] - [place], [place] - [address] (Ph: [phone])  Comments: [facility] preferred [facility].   **Let patient know to contact pharmacy at the end of the day to make sure medication is ready. **  ** Please notify patient to allow 48-72 hours to process**  **Encourage patient to contact the pharmacy for refills or they can request refills through [employer]**

## 2021-07-22 NOTE — Telephone Encounter (Signed)
Sent to pharmacy 

## 2021-07-28 ENCOUNTER — Ambulatory Visit (INDEPENDENT_AMBULATORY_CARE_PROVIDER_SITE_OTHER): Payer: Medicare Other

## 2021-07-28 VITALS — Wt 132.0 lb

## 2021-07-28 DIAGNOSIS — Z780 Asymptomatic menopausal state: Secondary | ICD-10-CM

## 2021-07-28 DIAGNOSIS — Z0000 Encounter for general adult medical examination without abnormal findings: Secondary | ICD-10-CM | POA: Diagnosis not present

## 2021-07-28 NOTE — Patient Instructions (Addendum)
Jasmine Rollins , Thank you for taking time to come for your Medicare Wellness Visit. I appreciate your ongoing commitment to your health goals. Please review the following plan we discussed and let me know if I can assist you in the future.   Screening recommendations/referrals: Colonoscopy: 06/11/15 Mammogram: 04/07/21 Bone Density: 11/06/17, referral sent Recommended yearly ophthalmology/optometry visit for glaucoma screening and checkup Recommended yearly dental visit for hygiene and checkup  Vaccinations: Influenza vaccine: 11/26/20 Pneumococcal vaccine: 01/25/17 Tdap vaccine: 06/22/10, due if have an injury Shingles vaccine: Shingrix 09/15/19, 11/14/19  Zostavax 08/05/10   Covid-19:03/27/19, 07/08/19, 01/30/20, 05/25/21  Advanced directives: no  Conditions/risks identified: none  Next appointment: Follow up in one year for your annual wellness visit - 08/02/22 @ 10 am by phone   Preventive Care 72 Years and Older, Female Preventive care refers to lifestyle choices and visits with your health care provider that can promote health and wellness. What does preventive care include? A yearly physical exam. This is also called an annual well check. Dental exams once or twice a year. Routine eye exams. Ask your health care provider how often you should have your eyes checked. Personal lifestyle choices, including: Daily care of your teeth and gums. Regular physical activity. Eating a healthy diet. Avoiding tobacco and drug use. Limiting alcohol use. Practicing safe sex. Taking low-dose aspirin every day. Taking vitamin and mineral supplements as recommended by your health care provider. What happens during an annual well check? The services and screenings done by your health care provider during your annual well check will depend on your age, overall health, lifestyle risk factors, and family history of disease. Counseling  Your health care provider may ask you questions about your: Alcohol  use. Tobacco use. Drug use. Emotional well-being. Home and relationship well-being. Sexual activity. Eating habits. History of falls. Memory and ability to understand (cognition). Work and work Astronomer. Reproductive health. Screening  You may have the following tests or measurements: Height, weight, and BMI. Blood pressure. Lipid and cholesterol levels. These may be checked every 5 years, or more frequently if you are over 65 years old. Skin check. Lung cancer screening. You may have this screening every year starting at age 72 if you have a 30-pack-year history of smoking and currently smoke or have quit within the past 15 years. Fecal occult blood test (FOBT) of the stool. You may have this test every year starting at age 72. Flexible sigmoidoscopy or colonoscopy. You may have a sigmoidoscopy every 5 years or a colonoscopy every 10 years starting at age 65. Hepatitis C blood test. Hepatitis B blood test. Sexually transmitted disease (STD) testing. Diabetes screening. This is done by checking your blood sugar (glucose) after you have not eaten for a while (fasting). You may have this done every 1-3 years. Bone density scan. This is done to screen for osteoporosis. You may have this done starting at age 24. Mammogram. This may be done every 1-2 years. Talk to your health care provider about how often you should have regular mammograms. Talk with your health care provider about your test results, treatment options, and if necessary, the need for more tests. Vaccines  Your health care provider may recommend certain vaccines, such as: Influenza vaccine. This is recommended every year. Tetanus, diphtheria, and acellular pertussis (Tdap, Td) vaccine. You may need a Td booster every 10 years. Zoster vaccine. You may need this after age 70. Pneumococcal 13-valent conjugate (PCV13) vaccine. One dose is recommended after age 10. Pneumococcal polysaccharide (  PPSV23) vaccine. One dose is  recommended after age 72. Talk to your health care provider about which screenings and vaccines you need and how often you need them. This information is not intended to replace advice given to you by your health care provider. Make sure you discuss any questions you have with your health care provider. Document Released: 02/27/2015 Document Revised: 10/21/2015 Document Reviewed: 12/02/2014 Elsevier Interactive Patient Education  2017 [place] Prevention in the Home Falls can cause injuries. They can happen to people of all ages. There are many things you can do to make your home safe and to help prevent falls. What can I do on the outside of my home? Regularly fix the edges of walkways and driveways and fix any cracks. Remove anything that might make you trip as you walk through a door, such as a raised step or threshold. Trim any bushes or trees on the path to your home. Use bright outdoor lighting. Clear any walking paths of anything that might make someone trip, such as rocks or tools. Regularly check to see if handrails are loose or broken. Make sure that both sides of any steps have handrails. Any raised decks and porches should have guardrails on the edges. Have any leaves, snow, or ice cleared regularly. Use sand or salt on walking paths during winter. Clean up any spills in your garage right away. This includes oil or grease spills. What can I do in the bathroom? Use night lights. Install grab bars by the toilet and in the tub and shower. Do not use towel bars as grab bars. Use non-skid mats or decals in the tub or shower. If you need to sit down in the shower, use a plastic, non-slip stool. Keep the floor dry. Clean up any water that spills on the floor as soon as it happens. Remove soap buildup in the tub or shower regularly. Attach bath mats securely with double-sided non-slip rug tape. Do not have throw rugs and other things on the floor that can make you  trip. What can I do in the bedroom? Use night lights. Make sure that you have a light by your bed that is easy to reach. Do not use any sheets or blankets that are too big for your bed. They should not hang down onto the floor. Have a firm chair that has side arms. You can use this for support while you get dressed. Do not have throw rugs and other things on the floor that can make you trip. What can I do in the kitchen? Clean up any spills right away. Avoid walking on wet floors. Keep items that you use a lot in easy-to-reach places. If you need to reach something above you, use a strong step stool that has a grab bar. Keep electrical cords out of the way. Do not use floor polish or wax that makes floors slippery. If you must use wax, use non-skid floor wax. Do not have throw rugs and other things on the floor that can make you trip. What can I do with my stairs? Do not leave any items on the stairs. Make sure that there are handrails on both sides of the stairs and use them. Fix handrails that are broken or loose. Make sure that handrails are as long as the stairways. Check any carpeting to make sure that it is firmly attached to the stairs. Fix any carpet that is loose or worn. Avoid having throw rugs at the top or  bottom of the stairs. If you do have throw rugs, attach them to the floor with carpet tape. Make sure that you have a light switch at the top of the stairs and the bottom of the stairs. If you do not have them, ask someone to add them for you. What else can I do to help prevent falls? Wear shoes that: Do not have high heels. Have rubber bottoms. Are comfortable and fit you well. Are closed at the toe. Do not wear sandals. If you use a stepladder: Make sure that it is fully opened. Do not climb a closed stepladder. Make sure that both sides of the stepladder are locked into place. Ask someone to hold it for you, if possible. Clearly mark and make sure that you can  see: Any grab bars or handrails. First and last steps. Where the edge of each step is. Use tools that help you move around (mobility aids) if they are needed. These include: Canes. Walkers. Scooters. Crutches. Turn on the lights when you go into a dark area. Replace any light bulbs as soon as they burn out. Set up your furniture so you have a clear path. Avoid moving your furniture around. If any of your floors are uneven, fix them. If there are any pets around you, be aware of where they are. Review your medicines with your doctor. Some medicines can make you feel dizzy. This can increase your chance of falling. Ask your doctor what other things that you can do to help prevent falls. This information is not intended to replace advice given to you by your health care provider. Make sure you discuss any questions you have with your health care provider. Document Released: 11/27/2008 Document Revised: 07/09/2015 Document Reviewed: 03/07/2014 Elsevier Interactive Patient Education  2017 [employer].

## 2021-07-28 NOTE — Progress Notes (Signed)
Virtual Visit via Telephone Note  I connected with  Jasmine Rollins on 07/28/21 at 11:00 AM EDT by telephone and verified that I am speaking with the correct person using two identifiers.  Location: Patient: home Provider: [facility] Persons participating in the virtual visit: patient/Nurse Health Advisor   I discussed the limitations, risks, security and privacy concerns of performing an evaluation and management service by telephone and the availability of in person appointments. The patient expressed understanding and agreed to proceed.  Interactive audio and video telecommunications were attempted between this nurse and patient, however failed, due to patient having technical difficulties OR patient did not have access to video capability.  We continued and completed visit with audio only.  Some vital signs may be absent or patient reported.   Hal Hope, LPN  Subjective:   Jasmine Rollins is a 71 y.o. female who presents for Medicare Annual (Subsequent) preventive examination.  Review of Systems     Cardiac Risk Factors include: advanced age (>60men, >41 women);hypertension     Objective:    There were no vitals filed for this visit. There is no height or weight on file to calculate BMI.     07/28/2021   11:01 AM 12/30/2020   11:25 AM 07/27/2020    1:16 PM 11/06/2018    9:54 AM 10/25/2017    8:29 AM 12/09/2015    8:30 AM 10/20/2015   10:54 PM  Advanced Directives  Does Patient Have a Medical Advance Directive? No No No No No No No  Does patient want to make changes to medical advance directive?   No - Patient declined      Would patient like information on creating a medical advance directive? No - Patient declined Yes (MAU/Ambulatory/Procedural Areas - Information given)  Yes (MAU/Ambulatory/Procedural Areas - Information given) Yes (MAU/Ambulatory/Procedural Areas - Information given) Yes - Educational materials given     Current Medications  (verified) Outpatient Encounter Medications as of 07/28/2021  Medication Sig   albuterol (PROVENTIL HFA) 108 (90 Base) MCG/ACT inhaler INHALE 2 PUFFS EVERY 4 HOURS AS NEEDED   alendronate (FOSAMAX) 70 MG tablet Take 1 tablet (70 mg total) by mouth every 7 (seven) days. Take with a full glass of water on an empty stomach.   amLODipine (NORVASC) 5 MG tablet Take 1 tablet (5 mg total) by mouth daily.   Calcium Carb-Cholecalciferol 600-800 MG-UNIT TABS Take by mouth.   fluticasone (FLONASE) 50 MCG/ACT nasal spray Place 2 sprays into both nostrils daily.   fluticasone (FLOVENT HFA) 44 MCG/ACT inhaler USE 2 INHALATIONS TWICE A DAY (RINSE MOUTH AFTER USE)   glucose blood (FREESTYLE LITE) test strip To check glucose once daily (dx. R73.03)   latanoprost (XALATAN) 0.005 % ophthalmic solution Place 1 drop into both eyes at bedtime.    montelukast (SINGULAIR) 10 MG tablet Take 1 tablet (10 mg total) by mouth at bedtime.   olmesartan-hydrochlorothiazide (BENICAR HCT) 40-12.5 MG tablet Take 1 tablet by mouth daily.   predniSONE (DELTASONE) 10 MG tablet Take 3 pills once daily by mouth for 3 days, then 2 pills once daily for 3 days, then 1 pill once daily for 3 days and then stop (Patient not taking: Reported on 07/28/2021)   No facility-administered encounter medications on file as of 07/28/2021.    Allergies (verified) Augmentin [amoxicillin-pot clavulanate] and Sulfonamide derivatives   History: Past Medical History:  Diagnosis Date   Allergic rhinitis    Arthritis    hip  Borderline high cholesterol    Cataract    Cough variant asthma    Diverticulosis of colon    GERD (gastroesophageal reflux disease)    HTN (hypertension)    Hyperglycemia    borderline DM   OP (osteoporosis)    Seasonal allergies    Sleep apnea    no cpap   Past Surgical History:  Procedure Laterality Date   COLONOSCOPY     COLPOSCOPY     ESOPHAGOGASTRODUODENOSCOPY  10/01   Negative   ROTATOR CUFF REPAIR      right   TUBAL LIGATION     Family History  Problem Relation Age of Onset   Hypertension Father    Diabetes Father    Diabetes Brother    Diabetes Brother    Cancer Brother        prostate   Cancer Brother        prostate   Cancer Brother        prostate   Cancer Brother        prostate   Cancer Brother        prostate   Cancer Brother        prostate   Breast cancer Maternal Aunt    Colon cancer Neg Hx    Social History   Socioeconomic History   Marital status: Married    Spouse name: Not on file   Number of children: 1   Years of education: Not on file   Highest education level: Not on file  Occupational History   Occupation: Customer Service  Tobacco Use   Smoking status: Never   Smokeless tobacco: Never   Tobacco comments:    Remote 2nd hand exposure  Vaping Use   Vaping Use: Never used  Substance and Sexual Activity   Alcohol use: No    Alcohol/week: 0.0 standard drinks of alcohol   Drug use: No   Sexual activity: Yes  Other Topics Concern   Not on file  Social History Narrative   Not on file   Social Determinants of Health   Financial Resource Strain: Low Risk  (07/28/2021)   Overall Financial Resource Strain (CARDIA)    Difficulty of Paying Living Expenses: Not hard at all  Food Insecurity: No Food Insecurity (07/28/2021)   Hunger Vital Sign    Worried About Running Out of Food in the Last Year: Never true    Ran Out of Food in the Last Year: Never true  Transportation Needs: No Transportation Needs (07/28/2021)   PRAPARE - Administrator, Civil ServiceTransportation    Lack of Transportation (Medical): No    Lack of Transportation (Non-Medical): No  Physical Activity: Insufficiently Active (07/28/2021)   Exercise Vital Sign    Days of Exercise per Week: 3 days    Minutes of Exercise per Session: 30 min  Stress: No Stress Concern Present (07/28/2021)   [employer] - Occupational Stress Questionnaire    Feeling of Stress : Not at all  Social  Connections: Moderately Integrated (07/28/2021)   Social Connection and Isolation Panel [NHANES]    Frequency of Communication with Friends and Family: More than three times a week    Frequency of Social Gatherings with Friends and Family: Three times a week    Attends Religious Services: More than 4 times per year    Active Member of Clubs or Organizations: No    Attends BankerClub or Organization Meetings: Never    Marital Status: Married    Tobacco Counseling Counseling  given: Not Answered Tobacco comments: Remote 2nd hand exposure   Clinical Intake:  Pre-visit preparation completed: Yes  Pain : No/denies pain     Nutritional Risks: None Diabetes: No  How often do you need to have someone help you when you read instructions, pamphlets, or other written materials from your doctor or pharmacy?: 1 - Never  Diabetic?no  Interpreter Needed?: No  Information entered by :: Kennedy Bucker, LPN   Activities of Daily Living    07/28/2021   11:02 AM  In your present state of health, do you have any difficulty performing the following activities:  Hearing? 0  Vision? 0  Difficulty concentrating or making decisions? 0  Walking or climbing stairs? 0  Dressing or bathing? 0  Doing errands, shopping? 0  Preparing Food and eating ? N  Using the Toilet? N  In the past six months, have you accidently leaked urine? N  Do you have problems with loss of bowel control? N  Managing your Medications? N  Managing your Finances? N  Housekeeping or managing your Housekeeping? N    Patient Care Team: Tower, Audrie Gallus, MD as PCP - Cherlynn Polo, MD as Consulting Physician (Ophthalmology) Phil Dopp, [facility] as Pharmacist (Pharmacist)  Indicate any recent Medical Services you may have received from other than [facility] providers in the past year (date may be approximate).     Assessment:   This is a routine wellness examination for Jasmine Rollins.  Hearing/Vision screen Hearing Screening -  Comments:: No aids Vision Screening - Comments:: Wears glasses- Dr.Hecker  Dietary issues and exercise activities discussed: Current Exercise Habits: Home exercise routine, Type of exercise: walking, Time (Minutes): 30, Frequency (Times/Week): 3, Weekly Exercise (Minutes/Week): 90, Intensity: Mild   Goals Addressed             This Visit's Progress    DIET - EAT MORE FRUITS AND VEGETABLES         Depression Screen    07/28/2021   10:58 AM 07/27/2020    1:20 PM 01/14/2020    9:20 AM 11/06/2018    9:57 AM 10/25/2017    8:04 AM 01/25/2017   10:47 AM 12/09/2015    8:32 AM  PHQ 2/9 Scores  PHQ - 2 Score 0 0 0 0 0 0 0  PHQ- 9 Score 0 0 1 0 0      Fall Risk    07/28/2021   11:02 AM 07/27/2020    1:18 PM 11/06/2018    9:57 AM 10/25/2017    8:04 AM 01/25/2017   10:47 AM  Fall Risk   Falls in the past year? 0 0 0 No Yes  Number falls in past yr: 0 0 0  1  Injury with Fall? 0 0   Yes  Risk for fall due to : No Fall Risks Medication side effect Medication side effect    Follow up Falls evaluation completed Falls evaluation completed;Falls prevention discussed Falls evaluation completed;Falls prevention discussed  Falls evaluation completed    FALL RISK PREVENTION PERTAINING TO THE HOME:  Any stairs in or around the home? No  If so, are there any without handrails? No  Home free of loose throw rugs in walkways, pet beds, electrical cords, etc? Yes  Adequate lighting in your home to reduce risk of falls? Yes   ASSISTIVE DEVICES UTILIZED TO PREVENT FALLS:  Life alert? No  Use of a cane, walker or w/c? No  Grab bars in the bathroom? Yes  Shower chair  or bench in shower? Yes  Elevated toilet seat or a handicapped toilet? Yes     Cognitive Function:    07/27/2020    1:22 PM 11/06/2018   10:02 AM 10/25/2017    8:04 AM 12/09/2015    8:55 AM  MMSE - Mini Mental State Exam  Orientation to time 5 5 5 5   Orientation to Place 5 5 5 5   Registration 3 3 3 3   Attention/  Calculation 5 5 0 0  Recall 3 3 3 3   Language- name 2 objects   0 0  Language- repeat 1 1 1 1   Language- follow 3 step command   3 3  Language- read & follow direction   0 0  Write a sentence   0 0  Copy design   0 0  Total score   20 20        07/28/2021   11:04 AM  6CIT Screen  What Year? 0 points  What month? 0 points  What time? 0 points  Count back from 20 0 points  Months in reverse 0 points  Repeat phrase 0 points  Total Score 0 points    Immunizations Immunization History  Administered Date(s) Administered   Fluad Quad(high Dose 65+) 12/10/2019   Influenza Whole 11/12/2008, 11/05/2009   Influenza, High Dose Seasonal PF 11/13/2014, 11/03/2017, 10/16/2018   Influenza,inj,Quad PF,6+ Mos 11/03/2016   Influenza-Unspecified 11/28/2012, 11/15/2013, 11/03/2016, 11/26/2020   PFIZER(Purple Top)SARS-COV-2 Vaccination 03/27/2019, 07/08/2019, 01/30/2020   Pfizer Covid-19 Vaccine Bivalent Booster 43yrs & up 05/25/2021   Pneumococcal Conjugate-13 12/09/2015   Pneumococcal Polysaccharide-23 03/19/2009, 01/25/2017   Td 05/15/2001   Tdap 06/22/2010   Zoster Recombinat (Shingrix) 09/15/2019, 11/14/2019   Zoster, Live 08/05/2010    TDAP status: Due, Education has been provided regarding the importance of this vaccine. Advised may receive this vaccine at local pharmacy or Health Dept. Aware to provide a copy of the vaccination record if obtained from local pharmacy or Health Dept. Verbalized acceptance and understanding.  Flu Vaccine status: Up to date  Pneumococcal vaccine status: Up to date  Covid-19 vaccine status: Completed vaccines  Qualifies for Shingles Vaccine? Yes   Zostavax completed Yes   Shingrix Completed?: Yes  Screening Tests Health Maintenance  Topic Date Due   TETANUS/TDAP  07/27/2025 (Originally 06/21/2020)   INFLUENZA VACCINE  09/14/2021   MAMMOGRAM  04/08/2023   COLONOSCOPY (Pts 45-73yrs Insurance coverage will need to be confirmed)  06/10/2025    Pneumonia Vaccine 10+ Years old  Completed   DEXA SCAN  Completed   COVID-19 Vaccine  Completed   Hepatitis C Screening  Completed   Zoster Vaccines- Shingrix  Completed   HPV VACCINES  Aged Out    Health Maintenance  There are no preventive care reminders to display for this patient.  Colorectal cancer screening: Type of screening: Colonoscopy. Completed 06/11/15. Repeat every 10 years  Mammogram status: Completed 04/07/21. Repeat every year  Bone Density status: Completed 11/06/2017. Results reflect: Bone density results: OSTEOPOROSIS. Repeat every 2 years.  Lung Cancer Screening: (Low Dose CT Chest recommended if Age 61-80 years, 30 pack-year currently smoking OR have quit w/in 15years.) does qualify.   Additional Screening:  Hepatitis C Screening: does qualify; Completed 06/08/15  Vision Screening: Recommended annual ophthalmology exams for early detection of glaucoma and other disorders of the eye. Is the patient up to date with their annual eye exam?  Yes  Who is the provider or what is the name of the office in  which the patient attends annual eye exams? Dr.Hecker If pt is not established with a provider, would they like to be referred to a provider to establish care? No .   Dental Screening: Recommended annual dental exams for proper oral hygiene  Community Resource Referral / Chronic Care Management: CRR required this visit?  No   CCM required this visit?  No      Plan:     I have personally reviewed and noted the following in the patient's chart:   Medical and social history Use of alcohol, tobacco or illicit drugs  Current medications and supplements including opioid prescriptions.  Functional ability and status Nutritional status Physical activity Advanced directives List of other physicians Hospitalizations, surgeries, and ER visits in previous 12 months Vitals Screenings to include cognitive, depression, and falls Referrals and appointments  In  addition, I have reviewed and discussed with patient certain preventive protocols, quality metrics, and best practice recommendations. A written personalized care plan for preventive services as well as general preventive health recommendations were provided to patient.     Hal Hope, LPN   03/04/1476   Nurse Notes: none

## 2021-08-18 ENCOUNTER — Other Ambulatory Visit: Payer: Self-pay | Admitting: Family Medicine

## 2021-09-01 ENCOUNTER — Telehealth: Payer: Self-pay | Admitting: Family Medicine

## 2021-09-01 ENCOUNTER — Other Ambulatory Visit: Payer: Medicare Other

## 2021-09-01 DIAGNOSIS — R7989 Other specified abnormal findings of blood chemistry: Secondary | ICD-10-CM

## 2021-09-01 DIAGNOSIS — E559 Vitamin D deficiency, unspecified: Secondary | ICD-10-CM

## 2021-09-01 DIAGNOSIS — E7800 Pure hypercholesterolemia, unspecified: Secondary | ICD-10-CM

## 2021-09-01 DIAGNOSIS — I10 Essential (primary) hypertension: Secondary | ICD-10-CM

## 2021-09-01 DIAGNOSIS — M810 Age-related osteoporosis without current pathological fracture: Secondary | ICD-10-CM

## 2021-09-01 DIAGNOSIS — R7303 Prediabetes: Secondary | ICD-10-CM

## 2021-09-01 NOTE — Telephone Encounter (Signed)
-----   Message from Alvina Chou sent at 08/27/2021 12:52 PM EDT ----- Regarding: Lab orders for Thursday, 7.20.23 Patient is scheduled for CPX labs, please order future labs, Thanks , Camelia Eng

## 2021-09-02 ENCOUNTER — Other Ambulatory Visit (INDEPENDENT_AMBULATORY_CARE_PROVIDER_SITE_OTHER): Payer: Medicare Other

## 2021-09-02 DIAGNOSIS — E559 Vitamin D deficiency, unspecified: Secondary | ICD-10-CM | POA: Diagnosis not present

## 2021-09-02 DIAGNOSIS — I10 Essential (primary) hypertension: Secondary | ICD-10-CM

## 2021-09-02 DIAGNOSIS — R7989 Other specified abnormal findings of blood chemistry: Secondary | ICD-10-CM | POA: Diagnosis not present

## 2021-09-02 DIAGNOSIS — E7800 Pure hypercholesterolemia, unspecified: Secondary | ICD-10-CM | POA: Diagnosis not present

## 2021-09-02 DIAGNOSIS — R7303 Prediabetes: Secondary | ICD-10-CM

## 2021-09-02 LAB — LIPID PANEL
Cholesterol: 217 mg/dL — ABNORMAL HIGH (ref 0–200)
HDL: 76.9 mg/dL (ref >39.00)
LDL Cholesterol: 126 mg/dL — ABNORMAL HIGH (ref 0–99)
NonHDL: 139.82
Total CHOL/HDL Ratio: 3
Triglycerides: 70 mg/dL (ref 0.0–149.0)
VLDL: 14 mg/dL (ref 0.0–40.0)

## 2021-09-02 LAB — CBC WITH DIFFERENTIAL/PLATELET
Basophils Absolute: 0 10*3/uL (ref 0.0–0.1)
Basophils Relative: 0.5 % (ref 0.0–3.0)
Eosinophils Absolute: 0.1 10*3/uL (ref 0.0–0.7)
Eosinophils Relative: 2.7 % (ref 0.0–5.0)
HCT: 40.3 % (ref 36.0–46.0)
Hemoglobin: 13.5 g/dL (ref 12.0–15.0)
Lymphocytes Relative: 50.6 % — ABNORMAL HIGH (ref 12.0–46.0)
Lymphs Abs: 1.5 10*3/uL (ref 0.7–4.0)
MCHC: 33.5 g/dL (ref 30.0–36.0)
MCV: 93.4 fl (ref 78.0–100.0)
Monocytes Absolute: 0.2 10*3/uL (ref 0.1–1.0)
Monocytes Relative: 7.5 % (ref 3.0–12.0)
Neutro Abs: 1.1 10*3/uL — ABNORMAL LOW (ref 1.4–7.7)
Neutrophils Relative %: 38.7 % — ABNORMAL LOW (ref 43.0–77.0)
Platelets: 231 10*3/uL (ref 150.0–400.0)
RBC: 4.32 Mil/uL (ref 3.87–5.11)
RDW: 12.9 % (ref 11.5–15.5)
WBC: 2.9 10*3/uL — ABNORMAL LOW (ref 4.0–10.5)

## 2021-09-02 LAB — COMPREHENSIVE METABOLIC PANEL
ALT: 18 U/L (ref 0–35)
AST: 16 U/L (ref 0–37)
Albumin: 4.4 g/dL (ref 3.5–5.2)
Alkaline Phosphatase: 79 U/L (ref 39–117)
BUN: 13 mg/dL (ref 6–23)
CO2: 31 mEq/L (ref 19–32)
Calcium: 9.6 mg/dL (ref 8.4–10.5)
Chloride: 104 mEq/L (ref 96–112)
Creatinine, Ser: 0.71 mg/dL (ref 0.40–1.20)
GFR: 85.14 mL/min (ref >60.00)
Glucose, Bld: 106 mg/dL — ABNORMAL HIGH (ref 70–99)
Potassium: 4.3 mEq/L (ref 3.5–5.1)
Sodium: 142 mEq/L (ref 135–145)
Total Bilirubin: 1.1 mg/dL (ref 0.2–1.2)
Total Protein: 7.1 g/dL (ref 6.0–8.3)

## 2021-09-02 LAB — HEMOGLOBIN A1C: Hgb A1c MFr Bld: 6.8 % — ABNORMAL HIGH (ref 4.6–6.5)

## 2021-09-02 LAB — TSH: TSH: 3.22 u[IU]/mL (ref 0.35–5.50)

## 2021-09-02 LAB — VITAMIN D 25 HYDROXY (VIT D DEFICIENCY, FRACTURES): VITD: 51.31 ng/mL (ref 30.00–100.00)

## 2021-09-02 LAB — T4, FREE: Free T4: 0.79 ng/dL (ref 0.60–1.60)

## 2021-09-08 ENCOUNTER — Encounter: Payer: Self-pay | Admitting: Family Medicine

## 2021-09-08 ENCOUNTER — Ambulatory Visit (INDEPENDENT_AMBULATORY_CARE_PROVIDER_SITE_OTHER): Payer: Medicare Other | Admitting: Family Medicine

## 2021-09-08 VITALS — BP 114/78 | HR 75 | Ht 62.5 in | Wt 131.6 lb

## 2021-09-08 DIAGNOSIS — K219 Gastro-esophageal reflux disease without esophagitis: Secondary | ICD-10-CM

## 2021-09-08 DIAGNOSIS — M810 Age-related osteoporosis without current pathological fracture: Secondary | ICD-10-CM | POA: Diagnosis not present

## 2021-09-08 DIAGNOSIS — D72818 Other decreased white blood cell count: Secondary | ICD-10-CM

## 2021-09-08 DIAGNOSIS — E7800 Pure hypercholesterolemia, unspecified: Secondary | ICD-10-CM

## 2021-09-08 DIAGNOSIS — R7303 Prediabetes: Secondary | ICD-10-CM

## 2021-09-08 DIAGNOSIS — E2839 Other primary ovarian failure: Secondary | ICD-10-CM

## 2021-09-08 DIAGNOSIS — E559 Vitamin D deficiency, unspecified: Secondary | ICD-10-CM

## 2021-09-08 DIAGNOSIS — Z0000 Encounter for general adult medical examination without abnormal findings: Secondary | ICD-10-CM | POA: Diagnosis not present

## 2021-09-08 DIAGNOSIS — I10 Essential (primary) hypertension: Secondary | ICD-10-CM

## 2021-09-08 DIAGNOSIS — R7989 Other specified abnormal findings of blood chemistry: Secondary | ICD-10-CM

## 2021-09-08 NOTE — Assessment & Plan Note (Signed)
Disc goals for lipids and reasons to control them Rev last labs with pt Rev low sat fat diet in detail LDL is up with recent inc in fried food and bacon  Pt will work on this  Re check in 3 months

## 2021-09-08 NOTE — Assessment & Plan Note (Signed)
dexa 10/2017 with OP On 2nd course of alendronate after drug holiday No falls or fractures Taking ca and D  D level therapeutic

## 2021-09-08 NOTE — Assessment & Plan Note (Signed)
Wbc 2.9  Not far from baseline  No clinical changes

## 2021-09-08 NOTE — Assessment & Plan Note (Signed)
Lab Results  Component Value Date   HGBA1C 6.8 (H) 09/02/2021   Up from 6.3 Now in the DM range  disc imp of low glycemic diet and wt loss to prevent DM2  F/u 3 mo and re check

## 2021-09-08 NOTE — Assessment & Plan Note (Signed)
bp is stable today  No cp or palpitations or headaches or edema  No side effects to medicines  BP Readings from Last 3 Encounters:  09/08/21 114/78  07/01/21 138/86  12/08/20 134/76    Amlodipine 5 mg daily  beniar hct 40-12.5 mg daily

## 2021-09-08 NOTE — Patient Instructions (Addendum)
Continue the calcium and D and fosamax   Add more exercise when you can   For cholesterol Avoid red meat/ fried foods/ egg yolks/ fatty breakfast meats/ butter, cheese and high fat dairy/ and shellfish   A low glycemic diet is important  Try to get most of your carbohydrates from produce (with the exception of white potatoes)  Eat less bread/pasta/rice/snack foods/cereals/sweets and other items from the middle of the grocery store (processed carbs)  Follow up in 3 months to follow up with blood sugar and cholesterol   Call and schedule your bone density test   Please call the location of your choice from the menu below to schedule your Mammogram and/or Bone Density appointment.    [facility]   [facility]                      Phone:  [phone] [address]                               [place], [place] [address]                                                             Services: Traditional and 3D Mammogram, Bone Density   [facility]                 Phone: [phone] [address]                                                       [place], [place] [address]    Service: Bone Density ONLY   *this site does NOT perform mammograms  [facility]                        Phone:  [phone] [address]                                  [place], [place] [address]                                            Services:  3D Mammogram and Bone Density    [facility]   Phone:  [phone]   [address]                                                                            [place], [place] [address]  Services: 3D Mammogram and Bone Density  [place] [facility])  Phone:  [phone]   [address] [place], [place] [address]                                              Services:  3D Mammogram and Bone Density

## 2021-09-08 NOTE — Assessment & Plan Note (Addendum)
Therapeutic D level at 51  Vitamin D level is therapeutic with current supplementation Disc importance of this to bone and overall health

## 2021-09-08 NOTE — Assessment & Plan Note (Signed)
Reviewed health habits including diet and exercise and skin cancer prevention Reviewed appropriate screening tests for age  Also reviewed health mt list, fam hx and immunization status , as well as social and family history   See HPI Labs reviewed  Colonoscopy utd 2017 Mammogram utd 03/2021 dexa due and ordered, no falls or fractures

## 2021-09-08 NOTE — Progress Notes (Signed)
Subjective:    Patient ID: Jasmine Rollins, female    DOB: June 06, 1950, 71 y.o.   MRN: [id number]  HPI Here for health maintenance exam and to review chronic medical problems   Wt Readings from Last 3 Encounters:  09/08/21 131 lb 9.6 oz (59.7 kg)  07/28/21 132 lb (59.9 kg)  07/01/21 132 lb 6 oz (60 kg)   23.69 kg/m  Doing ok overall   Too busy to exercise lately  Difficult to schedule -but working on it  Enjoys walking    Immunization History  Administered Date(s) Administered   Fluad Quad(high Dose 65+) 12/10/2019   Influenza Whole 11/12/2008, 11/05/2009   Influenza, High Dose Seasonal PF 11/13/2014, 11/03/2017, 10/16/2018   Influenza,inj,Quad PF,6+ Mos 11/03/2016   Influenza-Unspecified 11/28/2012, 11/15/2013, 11/03/2016, 11/26/2020   PFIZER(Purple Top)SARS-COV-2 Vaccination 03/27/2019, 07/08/2019, 01/30/2020   Pfizer Covid-19 Vaccine Bivalent Booster 62yrs & up 05/25/2021   Pneumococcal Conjugate-13 12/09/2015   Pneumococcal Polysaccharide-23 03/19/2009, 01/25/2017   Td 05/15/2001   Tdap 06/22/2010   Zoster Recombinat (Shingrix) 09/15/2019, 11/14/2019   Zoster, Live 08/05/2010   There are no preventive care reminders to display for this patient.  Mammogram 03/2021 Self breast exam : no lumps   Colonoscopy 05/2015 with 10 y recall   Dexa  10/2017  -OP Alendronate 2nd course px in June 2022/ tolerates well  Falls:  none Fractures: none  Supplements : ca and D D level is 51  Exercise -walking  Has a painful area on L hand   HTN bp is stable today  No cp or palpitations or headaches or edema  No side effects to medicines  BP Readings from Last 3 Encounters:  09/08/21 114/78  07/01/21 138/86  12/08/20 134/76     Amlodipine 5 mg daily  Benicar hct 40-12.5 mg daily   Lab Results  Component Value Date   CREATININE 0.71 09/02/2021   BUN 13 09/02/2021   NA 142 09/02/2021   K 4.3 09/02/2021   CL 104 09/02/2021   CO2 31 09/02/2021    GERD-prev took  omeprazole and ranitidine  Doing ok without medicine    Lab Results  Component Value Date   TSH 3.22 09/02/2021   Elevated in past   Hyperlipidemia Lab Results  Component Value Date   CHOL 217 (H) 09/02/2021   CHOL 186 07/27/2020   CHOL 213 (H) 02/05/2019   Lab Results  Component Value Date   HDL 76.90 09/02/2021   HDL 74.40 07/27/2020   HDL 73.10 02/05/2019   Lab Results  Component Value Date   LDLCALC 126 (H) 09/02/2021   LDLCALC 97 07/27/2020   LDLCALC 126 (H) 02/05/2019   Lab Results  Component Value Date   TRIG 70.0 09/02/2021   TRIG 73.0 07/27/2020   TRIG 70.0 02/05/2019   Lab Results  Component Value Date   CHOLHDL 3 09/02/2021   CHOLHDL 2 07/27/2020   CHOLHDL 3 02/05/2019   Lab Results  Component Value Date   LDLDIRECT 106.4 02/26/2013   LDLDIRECT 104.2 09/27/2011   LDLDIRECT 86.7 11/27/2007   LDL is up  Eating too much fried food -more than once per week  Cut back on bacon  Husband demands fatty foods   Prediabetes Lab Results  Component Value Date   HGBA1C 6.8 (H) 09/02/2021   This is up from 6.3 Cut sweets back No sugary drinks  Coffee is black now   Processed foods and potatoes and fries are more  Starches   Lab Results  Component Value Date   ALT 18 09/02/2021   AST 16 09/02/2021   ALKPHOS 79 09/02/2021   BILITOT 1.1 09/02/2021   Lab Results  Component Value Date   WBC 2.9 (L) 09/02/2021   HGB 13.5 09/02/2021   HCT 40.3 09/02/2021   MCV 93.4 09/02/2021   PLT 231.0 09/02/2021     Patient Active Problem List   Diagnosis Date Noted   Post herpetic neuralgia 12/02/2020   Scalp pain 12/02/2020   Neck pain 11/20/2020   Insomnia 11/05/2020   Skin lesion 08/05/2020   Right knee pain 11/22/2019   Vitamin D deficiency 12/06/2015   GERD (gastroesophageal reflux disease) 06/09/2014   Encounter for routine gynecological examination 03/03/2014   Estrogen deficiency 03/03/2014   Heartburn 03/01/2013   Gynecological  examination 06/22/2010   Special screening for malignant neoplasms, colon 06/22/2010   Glaucoma suspect 06/22/2010   Routine general medical examination at a health care facility 06/03/2010   Sleep apnea    Osteoporosis 05/20/2009   SLEEP APNEA 10/17/2008   COUGH VARIANT ASTHMA 07/30/2008   Leukocytopenia 01/29/2008   Prediabetes 11/27/2007   HYPERCHOLESTEROLEMIA 03/21/2007   Essential hypertension 03/21/2007   Sinusitis, chronic 03/21/2007   Allergic rhinitis 03/21/2007   DIVERTICULOSIS, COLON 03/21/2007   Past Medical History:  Diagnosis Date   Allergic rhinitis    Arthritis    hip   Borderline high cholesterol    Cataract    Cough variant asthma    Diverticulosis of colon    GERD (gastroesophageal reflux disease)    HTN (hypertension)    Hyperglycemia    borderline DM   OP (osteoporosis)    Seasonal allergies    Sleep apnea    no cpap   Past Surgical History:  Procedure Laterality Date   COLONOSCOPY     COLPOSCOPY     ESOPHAGOGASTRODUODENOSCOPY  10/01   Negative   ROTATOR CUFF REPAIR     right   TUBAL LIGATION     Social History   Tobacco Use   Smoking status: Never   Smokeless tobacco: Never   Tobacco comments:    Remote 2nd hand exposure  Vaping Use   Vaping Use: Never used  Substance Use Topics   Alcohol use: No    Alcohol/week: 0.0 standard drinks of alcohol   Drug use: No   Family History  Problem Relation Age of Onset   Hypertension Father    Diabetes Father    Diabetes Brother    Diabetes Brother    Cancer Brother        prostate   Cancer Brother        prostate   Cancer Brother        prostate   Cancer Brother        prostate   Cancer Brother        prostate   Cancer Brother        prostate   Breast cancer Maternal Aunt    Colon cancer Neg Hx    Allergies  Allergen Reactions   Augmentin [Amoxicillin-Pot Clavulanate]     GI upset    Sulfonamide Derivatives Rash   Current Outpatient Medications on File Prior to Visit   Medication Sig Dispense Refill   albuterol (PROVENTIL HFA) 108 (90 Base) MCG/ACT inhaler INHALE 2 PUFFS EVERY 4 HOURS AS NEEDED 18 g 3   alendronate (FOSAMAX) 70 MG tablet TAKE 1 TABLET EVERY 7 DAYS WITH A FULL GLASS OF WATER ON AN EMPTY STOMACH 12  tablet 3   amLODipine (NORVASC) 5 MG tablet Take 1 tablet (5 mg total) by mouth daily. 90 tablet 3   Calcium Carb-Cholecalciferol 600-800 MG-UNIT TABS Take by mouth.     fluticasone (FLONASE) 50 MCG/ACT nasal spray Place 2 sprays into both nostrils daily. 48 g 3   fluticasone (FLOVENT HFA) 44 MCG/ACT inhaler USE 2 INHALATIONS TWICE A DAY (RINSE MOUTH AFTER USE) 31.8 g 3   glucose blood (FREESTYLE LITE) test strip To check glucose once daily (dx. R73.03) 100 each 1   latanoprost (XALATAN) 0.005 % ophthalmic solution Place 1 drop into both eyes at bedtime.      montelukast (SINGULAIR) 10 MG tablet Take 1 tablet (10 mg total) by mouth at bedtime. 90 tablet 3   olmesartan-hydrochlorothiazide (BENICAR HCT) 40-12.5 MG tablet Take 1 tablet by mouth daily. 90 tablet 3   No current facility-administered medications on file prior to visit.     Review of Systems  Constitutional:  Negative for activity change, appetite change, fever and unexpected weight change.  HENT:  Negative for congestion, ear pain, rhinorrhea, sinus pressure and sore throat.   Eyes:  Negative for pain, redness and visual disturbance.  Respiratory:  Negative for cough, shortness of breath and wheezing.   Cardiovascular:  Negative for chest pain and palpitations.  Gastrointestinal:  Negative for abdominal pain, blood in stool, constipation and diarrhea.  Endocrine: Negative for polydipsia and polyuria.  Genitourinary:  Negative for dysuria, frequency and urgency.  Musculoskeletal:  Negative for arthralgias, back pain and myalgias.  Skin:  Negative for pallor and rash.  Allergic/Immunologic: Negative for environmental allergies.  Neurological:  Negative for dizziness, syncope and  headaches.  Hematological:  Negative for adenopathy. Does not bruise/bleed easily.  Psychiatric/Behavioral:  Negative for decreased concentration and dysphoric mood. The patient is not nervous/anxious.        Objective:   Physical Exam Constitutional:      General: She is not in acute distress.    Appearance: Normal appearance. She is well-developed and normal weight. She is not ill-appearing or diaphoretic.  HENT:     Head: Normocephalic and atraumatic.     Right Ear: Tympanic membrane, ear canal and external ear normal.     Left Ear: Tympanic membrane, ear canal and external ear normal.     Nose: Nose normal. No congestion.     Mouth/Throat:     Mouth: Mucous membranes are moist.     Pharynx: Oropharynx is clear. No posterior oropharyngeal erythema.  Eyes:     General: No scleral icterus.    Extraocular Movements: Extraocular movements intact.     Conjunctiva/sclera: Conjunctivae normal.     Pupils: Pupils are equal, round, and reactive to light.  Neck:     Thyroid: No thyromegaly.     Vascular: No carotid bruit or JVD.  Cardiovascular:     Rate and Rhythm: Normal rate and regular rhythm.     Pulses: Normal pulses.     Heart sounds: Normal heart sounds.     No gallop.  Pulmonary:     Effort: Pulmonary effort is normal. No respiratory distress.     Breath sounds: Normal breath sounds. No wheezing.     Comments: Good air exch Chest:     Chest wall: No tenderness.  Abdominal:     General: Bowel sounds are normal. There is no distension or abdominal bruit.     Palpations: Abdomen is soft. There is no mass.     Tenderness: There is  no abdominal tenderness.     Hernia: No hernia is present.  Genitourinary:    Comments: Breast exam: No mass, nodules, thickening, tenderness, bulging, retraction, inflamation, nipple discharge or skin changes noted.  No axillary or clavicular LA.     Musculoskeletal:        General: No tenderness. Normal range of motion.     Cervical back:  Normal range of motion and neck supple. No rigidity. No muscular tenderness.     Right lower leg: No edema.     Left lower leg: No edema.     Comments: No kyphosis   Lymphadenopathy:     Cervical: No cervical adenopathy.  Skin:    General: Skin is warm and dry.     Coloration: Skin is not pale.     Findings: No erythema or rash.  Neurological:     Mental Status: She is alert. Mental status is at baseline.     Cranial Nerves: No cranial nerve deficit.     Motor: No abnormal muscle tone.     Coordination: Coordination normal.     Gait: Gait normal.     Deep Tendon Reflexes: Reflexes are normal and symmetric.  Psychiatric:        Mood and Affect: Mood normal.        Cognition and Memory: Cognition and memory normal.           Assessment & Plan:   Problem List Items Addressed This Visit       Cardiovascular and Mediastinum   Essential hypertension    bp is stable today  No cp or palpitations or headaches or edema  No side effects to medicines  BP Readings from Last 3 Encounters:  09/08/21 114/78  07/01/21 138/86  12/08/20 134/76    Amlodipine 5 mg daily  beniar hct 40-12.5 mg daily         Digestive   GERD (gastroesophageal reflux disease)    Doing well without medication now  Watching her diet         Musculoskeletal and Integument   Osteoporosis    dexa 10/2017 with OP On 2nd course of alendronate after drug holiday No falls or fractures Taking ca and D  D level therapeutic         Other   RESOLVED: Elevated TSH    Normal this draw Lab Results  Component Value Date   TSH 3.22 09/02/2021         Estrogen deficiency   Relevant Orders   DG Bone Density   HYPERCHOLESTEROLEMIA    Disc goals for lipids and reasons to control them Rev last labs with pt Rev low sat fat diet in detail LDL is up with recent inc in fried food and bacon  Pt will work on this  Re check in 3 months       Leukocytopenia    Wbc 2.9  Not far from baseline  No  clinical changes       Prediabetes    Lab Results  Component Value Date   HGBA1C 6.8 (H) 09/02/2021  Up from 6.3 Now in the DM range  disc imp of low glycemic diet and wt loss to prevent DM2  F/u 3 mo and re check       Routine general medical examination at a health care facility - Primary    Reviewed health habits including diet and exercise and skin cancer prevention Reviewed appropriate screening tests for age  Also reviewed health mt  list, fam hx and immunization status , as well as social and family history   See HPI Labs reviewed  Colonoscopy utd 2017 Mammogram utd 03/2021 dexa due and ordered, no falls or fractures         Vitamin D deficiency    Therapeutic D level at 51  Vitamin D level is therapeutic with current supplementation Disc importance of this to bone and overall health

## 2021-09-08 NOTE — Assessment & Plan Note (Signed)
Normal this draw Lab Results  Component Value Date   TSH 3.22 09/02/2021

## 2021-09-08 NOTE — Assessment & Plan Note (Signed)
Doing well without medication now  Watching her diet

## 2021-10-08 ENCOUNTER — Other Ambulatory Visit: Payer: Self-pay | Admitting: Family Medicine

## 2021-10-19 DIAGNOSIS — H04123 Dry eye syndrome of bilateral lacrimal glands: Secondary | ICD-10-CM | POA: Diagnosis not present

## 2021-10-19 DIAGNOSIS — H40033 Anatomical narrow angle, bilateral: Secondary | ICD-10-CM | POA: Diagnosis not present

## 2021-10-19 DIAGNOSIS — H401131 Primary open-angle glaucoma, bilateral, mild stage: Secondary | ICD-10-CM | POA: Diagnosis not present

## 2021-10-19 DIAGNOSIS — H25813 Combined forms of age-related cataract, bilateral: Secondary | ICD-10-CM | POA: Diagnosis not present

## 2021-10-21 ENCOUNTER — Other Ambulatory Visit: Payer: Self-pay | Admitting: Family Medicine

## 2021-12-01 ENCOUNTER — Telehealth: Payer: Self-pay | Admitting: Family Medicine

## 2021-12-01 DIAGNOSIS — I10 Essential (primary) hypertension: Secondary | ICD-10-CM

## 2021-12-01 DIAGNOSIS — E7800 Pure hypercholesterolemia, unspecified: Secondary | ICD-10-CM

## 2021-12-01 DIAGNOSIS — R7303 Prediabetes: Secondary | ICD-10-CM

## 2021-12-01 DIAGNOSIS — M810 Age-related osteoporosis without current pathological fracture: Secondary | ICD-10-CM

## 2021-12-01 DIAGNOSIS — E559 Vitamin D deficiency, unspecified: Secondary | ICD-10-CM

## 2021-12-01 NOTE — Telephone Encounter (Signed)
-----   Message from Ellamae Sia sent at 11/22/2021  4:09 PM EDT ----- Regarding: Lab orders for Thursday, 10.19.23 Patient is scheduled for CPX labs, please order future labs, Thanks , Karna Christmas

## 2021-12-02 ENCOUNTER — Other Ambulatory Visit (INDEPENDENT_AMBULATORY_CARE_PROVIDER_SITE_OTHER): Payer: Medicare Other

## 2021-12-02 DIAGNOSIS — E7800 Pure hypercholesterolemia, unspecified: Secondary | ICD-10-CM | POA: Diagnosis not present

## 2021-12-02 DIAGNOSIS — E559 Vitamin D deficiency, unspecified: Secondary | ICD-10-CM | POA: Diagnosis not present

## 2021-12-02 DIAGNOSIS — R7303 Prediabetes: Secondary | ICD-10-CM

## 2021-12-02 DIAGNOSIS — I10 Essential (primary) hypertension: Secondary | ICD-10-CM | POA: Diagnosis not present

## 2021-12-02 LAB — COMPREHENSIVE METABOLIC PANEL
ALT: 31 U/L (ref 0–35)
AST: 20 U/L (ref 0–37)
Albumin: 4.4 g/dL (ref 3.5–5.2)
Alkaline Phosphatase: 99 U/L (ref 39–117)
BUN: 10 mg/dL (ref 6–23)
CO2: 32 mEq/L (ref 19–32)
Calcium: 9.8 mg/dL (ref 8.4–10.5)
Chloride: 103 mEq/L (ref 96–112)
Creatinine, Ser: 0.68 mg/dL (ref 0.40–1.20)
GFR: 87.05 mL/min (ref >60.00)
Glucose, Bld: 116 mg/dL — ABNORMAL HIGH (ref 70–99)
Potassium: 4.3 mEq/L (ref 3.5–5.1)
Sodium: 139 mEq/L (ref 135–145)
Total Bilirubin: 0.8 mg/dL (ref 0.2–1.2)
Total Protein: 7.2 g/dL (ref 6.0–8.3)

## 2021-12-02 LAB — CBC WITH DIFFERENTIAL/PLATELET
Basophils Absolute: 0 10*3/uL (ref 0.0–0.1)
Basophils Relative: 0.9 % (ref 0.0–3.0)
Eosinophils Absolute: 0.2 10*3/uL (ref 0.0–0.7)
Eosinophils Relative: 6.7 % — ABNORMAL HIGH (ref 0.0–5.0)
HCT: 40.2 % (ref 36.0–46.0)
Hemoglobin: 13.5 g/dL (ref 12.0–15.0)
Lymphocytes Relative: 38.9 % (ref 12.0–46.0)
Lymphs Abs: 1.1 10*3/uL (ref 0.7–4.0)
MCHC: 33.6 g/dL (ref 30.0–36.0)
MCV: 93.1 fl (ref 78.0–100.0)
Monocytes Absolute: 0.3 10*3/uL (ref 0.1–1.0)
Monocytes Relative: 10 % (ref 3.0–12.0)
Neutro Abs: 1.3 10*3/uL — ABNORMAL LOW (ref 1.4–7.7)
Neutrophils Relative %: 43.5 % (ref 43.0–77.0)
Platelets: 255 10*3/uL (ref 150.0–400.0)
RBC: 4.32 Mil/uL (ref 3.87–5.11)
RDW: 12.8 % (ref 11.5–15.5)
WBC: 2.9 10*3/uL — ABNORMAL LOW (ref 4.0–10.5)

## 2021-12-02 LAB — LIPID PANEL
Cholesterol: 209 mg/dL — ABNORMAL HIGH (ref 0–200)
HDL: 82.9 mg/dL (ref >39.00)
LDL Cholesterol: 111 mg/dL — ABNORMAL HIGH (ref 0–99)
NonHDL: 125.89
Total CHOL/HDL Ratio: 3
Triglycerides: 72 mg/dL (ref 0.0–149.0)
VLDL: 14.4 mg/dL (ref 0.0–40.0)

## 2021-12-02 LAB — TSH: TSH: 5.58 u[IU]/mL — ABNORMAL HIGH (ref 0.35–5.50)

## 2021-12-02 LAB — VITAMIN D 25 HYDROXY (VIT D DEFICIENCY, FRACTURES): VITD: 47.27 ng/mL (ref 30.00–100.00)

## 2021-12-02 LAB — HEMOGLOBIN A1C: Hgb A1c MFr Bld: 6.6 % — ABNORMAL HIGH (ref 4.6–6.5)

## 2021-12-09 ENCOUNTER — Encounter: Payer: Self-pay | Admitting: Family Medicine

## 2021-12-09 ENCOUNTER — Ambulatory Visit (INDEPENDENT_AMBULATORY_CARE_PROVIDER_SITE_OTHER): Payer: Medicare Other | Admitting: Family Medicine

## 2021-12-09 VITALS — BP 128/66 | HR 65 | Temp 97.2°F | Ht 62.5 in | Wt 130.1 lb

## 2021-12-09 DIAGNOSIS — E119 Type 2 diabetes mellitus without complications: Secondary | ICD-10-CM

## 2021-12-09 DIAGNOSIS — E1169 Type 2 diabetes mellitus with other specified complication: Secondary | ICD-10-CM

## 2021-12-09 DIAGNOSIS — Z23 Encounter for immunization: Secondary | ICD-10-CM | POA: Diagnosis not present

## 2021-12-09 DIAGNOSIS — I10 Essential (primary) hypertension: Secondary | ICD-10-CM

## 2021-12-09 DIAGNOSIS — E785 Hyperlipidemia, unspecified: Secondary | ICD-10-CM

## 2021-12-09 DIAGNOSIS — D72818 Other decreased white blood cell count: Secondary | ICD-10-CM

## 2021-12-09 MED ORDER — ROSUVASTATIN CALCIUM 5 MG PO TABS: 5.0000 mg | ORAL_TABLET | Freq: Every day | ORAL | 1 refills | Status: DC

## 2021-12-09 MED ORDER — METFORMIN HCL ER 500 MG PO TB24: 500.0000 mg | ORAL_TABLET | Freq: Every day | ORAL | 1 refills | Status: DC

## 2021-12-09 NOTE — Patient Instructions (Addendum)
Add some strength training - to gain some muscle  This will help all of your problems  Bands, weights, program, gym, machines Lots of videos on line  Keep walking   Check your blood sugar daily  Every other day - am /fasting  Every other day 2 hours after a meal (different times) Keep a log   Goal of 120s or less am Gwenevere Ghazi of 140s or less other times   Get a diabetic eye exam once per year  Take good care of your feet  Let us know if you want to go to some diabetic classes   Start metformin xr 500 mg daily  If you get any diarrhea that lasts more than 1-2 weeks let us know   Start generic crestor 5 mg daily in the evening  If you get muscle pain let us know   Follow up in 3 months

## 2021-12-09 NOTE — Progress Notes (Signed)
Subjective:    Patient ID: Jasmine Rollins, female    DOB: [birth year], 71 y.o.   MRN: [id number]  HPI Pt presents for f/u of elevated glucose, HTN and chronic medical problems   Wt Readings from Last 3 Encounters:  12/09/21 130 lb 2 oz (59 kg)  09/08/21 131 lb 9.6 oz (59.7 kg)  07/28/21 132 lb (59.9 kg)   Doing well  Walking daily with her daughter, up to 4 miles   Cut way back on sugar/ added sugar  Weakness is biscuits - she loves bread  (only has once a week)   Changing to brown rice   Husband is diabetic and eats terribly and will not change    Pulse Readings from Last 3 Encounters:  12/09/21 65  09/08/21 75  07/01/21 75     Prediabetes  Last visit a1c went up to 6.8 Lab Results  Component Value Date   HGBA1C 6.6 (H) 12/02/2021   This is improved but still in the DM range   Wants to totally cut sugar out  Wants to cut out bread   Does check her blood sugar   Eye exam was in the summer   HTN bp is stable today  No cp or palpitations or headaches or edema  No side effects to medicines  BP Readings from Last 3 Encounters:  12/09/21 128/66  09/08/21 114/78  07/01/21 138/86     Amlodipine 5 mg daily  Benicar hct 40-12.5 mg daily   Lab Results  Component Value Date   CREATININE 0.68 12/02/2021   BUN 10 12/02/2021   NA 139 12/02/2021   K 4.3 12/02/2021   CL 103 12/02/2021   CO2 32 12/02/2021   GFR 87.0 Lab Results  Component Value Date   WBC 2.9 (L) 12/02/2021   HGB 13.5 12/02/2021   HCT 40.2 12/02/2021   MCV 93.1 12/02/2021   PLT 255.0 12/02/2021   TSH is up a bit Lab Results  Component Value Date   TSH 5.58 (H) 12/02/2021      Hyperlipidemia Lab Results  Component Value Date   CHOL 209 (H) 12/02/2021   CHOL 217 (H) 09/02/2021   CHOL 186 07/27/2020   Lab Results  Component Value Date   HDL 82.90 12/02/2021   HDL 76.90 09/02/2021   HDL 74.40 07/27/2020   Lab Results  Component Value Date   LDLCALC 111 (H) 12/02/2021    LDLCALC 126 (H) 09/02/2021   LDLCALC 97 07/27/2020   Lab Results  Component Value Date   TRIG 72.0 12/02/2021   TRIG 70.0 09/02/2021   TRIG 73.0 07/27/2020   Lab Results  Component Value Date   CHOLHDL 3 12/02/2021   CHOLHDL 3 09/02/2021   CHOLHDL 2 07/27/2020   Lab Results  Component Value Date   LDLDIRECT 106.4 02/26/2013   LDLDIRECT 104.2 09/27/2011   LDLDIRECT 86.7 11/27/2007  Bakes chicken instead of frying   Patient Active Problem List   Diagnosis Date Noted   Post herpetic neuralgia 12/02/2020   Scalp pain 12/02/2020   Neck pain 11/20/2020   Insomnia 11/05/2020   Skin lesion 08/05/2020   Right knee pain 11/22/2019   Vitamin D deficiency 12/06/2015   GERD (gastroesophageal reflux disease) 06/09/2014   Encounter for routine gynecological examination 03/03/2014   Estrogen deficiency 03/03/2014   Heartburn 03/01/2013   Gynecological examination 06/22/2010   Special screening for malignant neoplasms, colon 06/22/2010   Glaucoma suspect 06/22/2010   Routine general medical examination at  a health care facility 06/03/2010   Sleep apnea    Osteoporosis 05/20/2009   SLEEP APNEA 10/17/2008   COUGH VARIANT ASTHMA 07/30/2008   Leukocytopenia 01/29/2008   Controlled type 2 diabetes mellitus without complication, without long-term current use of insulin (HCC) 11/27/2007   Hyperlipidemia associated with type 2 diabetes mellitus (HCC) 03/21/2007   Essential hypertension 03/21/2007   Sinusitis, chronic 03/21/2007   Allergic rhinitis 03/21/2007   DIVERTICULOSIS, COLON 03/21/2007   Past Medical History:  Diagnosis Date   Allergic rhinitis    Arthritis    hip   Borderline high cholesterol    Cataract    Cough variant asthma    Diverticulosis of colon    GERD (gastroesophageal reflux disease)    HTN (hypertension)    Hyperglycemia    borderline DM   OP (osteoporosis)    Seasonal allergies    Sleep apnea    no cpap   Past Surgical History:  Procedure Laterality  Date   COLONOSCOPY     COLPOSCOPY     ESOPHAGOGASTRODUODENOSCOPY  10/01   Negative   ROTATOR CUFF REPAIR     right   TUBAL LIGATION     Social History   Tobacco Use   Smoking status: Never   Smokeless tobacco: Never   Tobacco comments:    Remote 2nd hand exposure  Vaping Use   Vaping Use: Never used  Substance Use Topics   Alcohol use: No    Alcohol/week: 0.0 standard drinks of alcohol   Drug use: No   Family History  Problem Relation Age of Onset   Hypertension Father    Diabetes Father    Diabetes Brother    Diabetes Brother    Cancer Brother        prostate   Cancer Brother        prostate   Cancer Brother        prostate   Cancer Brother        prostate   Cancer Brother        prostate   Cancer Brother        prostate   Breast cancer Maternal Aunt    Colon cancer Neg Hx    Allergies  Allergen Reactions   Augmentin [Amoxicillin-Pot Clavulanate]     GI upset    Sulfonamide Derivatives Rash   Current Outpatient Medications on File Prior to Visit  Medication Sig Dispense Refill   albuterol (PROVENTIL HFA) 108 (90 Base) MCG/ACT inhaler INHALE 2 PUFFS EVERY 4 HOURS AS NEEDED 18 g 3   alendronate (FOSAMAX) 70 MG tablet TAKE 1 TABLET EVERY 7 DAYS WITH A FULL GLASS OF WATER ON AN EMPTY STOMACH 12 tablet 3   amLODipine (NORVASC) 5 MG tablet TAKE 1 TABLET DAILY 90 tablet 1   Calcium Carb-Cholecalciferol 600-800 MG-UNIT TABS Take by mouth.     fluticasone (FLONASE) 50 MCG/ACT nasal spray Place 2 sprays into both nostrils daily. 48 g 3   fluticasone (FLOVENT HFA) 44 MCG/ACT inhaler USE 2 INHALATIONS TWICE A DAY (RINSE MOUTH AFTER USE) 31.8 g 3   glucose blood (FREESTYLE LITE) test strip To check glucose once daily (dx. R73.03) 100 each 1   latanoprost (XALATAN) 0.005 % ophthalmic solution Place 1 drop into both eyes at bedtime.      montelukast (SINGULAIR) 10 MG tablet TAKE 1 TABLET AT BEDTIME 90 tablet 3   olmesartan-hydrochlorothiazide (BENICAR HCT) 40-12.5 MG  tablet Take 1 tablet by mouth daily. 90 tablet 3  Omega-3 Fatty Acids (FISH OIL PO) Take 1 capsule by mouth daily.     No current facility-administered medications on file prior to visit.    Review of Systems  Constitutional:  Negative for activity change, appetite change, fatigue, fever and unexpected weight change.  HENT:  Positive for rhinorrhea. Negative for congestion, ear pain, sinus pressure and sore throat.   Eyes:  Negative for pain, redness and visual disturbance.  Respiratory:  Negative for cough, shortness of breath and wheezing.   Cardiovascular:  Negative for chest pain and palpitations.  Gastrointestinal:  Negative for abdominal pain, blood in stool, constipation and diarrhea.  Endocrine: Negative for polydipsia and polyuria.  Genitourinary:  Negative for dysuria, frequency and urgency.  Musculoskeletal:  Negative for arthralgias, back pain and myalgias.  Skin:  Negative for pallor and rash.  Allergic/Immunologic: Negative for environmental allergies.  Neurological:  Negative for dizziness, syncope and headaches.  Hematological:  Negative for adenopathy. Does not bruise/bleed easily.  Psychiatric/Behavioral:  Negative for decreased concentration and dysphoric mood. The patient is not nervous/anxious.        Objective:   Physical Exam Constitutional:      General: She is not in acute distress.    Appearance: Normal appearance. She is well-developed and normal weight. She is not ill-appearing or diaphoretic.  HENT:     Head: Normocephalic and atraumatic.  Eyes:     Conjunctiva/sclera: Conjunctivae normal.     Pupils: Pupils are equal, round, and reactive to light.  Neck:     Thyroid: No thyromegaly.     Vascular: No carotid bruit or JVD.  Cardiovascular:     Rate and Rhythm: Normal rate and regular rhythm.     Heart sounds: Normal heart sounds.     No gallop.  Pulmonary:     Effort: Pulmonary effort is normal. No respiratory distress.     Breath sounds: Normal  breath sounds. No wheezing or rales.  Abdominal:     General: There is no distension or abdominal bruit.     Palpations: Abdomen is soft.  Musculoskeletal:     Cervical back: Normal range of motion and neck supple.     Right lower leg: No edema.     Left lower leg: No edema.  Lymphadenopathy:     Cervical: No cervical adenopathy.  Skin:    General: Skin is warm and dry.     Coloration: Skin is not pale.     Findings: No rash.  Neurological:     Mental Status: She is alert.     Coordination: Coordination normal.     Deep Tendon Reflexes: Reflexes are normal and symmetric. Reflexes normal.  Psychiatric:        Mood and Affect: Mood normal.           Assessment & Plan:   Problem List Items Addressed This Visit       Cardiovascular and Mediastinum   Essential hypertension    bp in fair control at this time  BP Readings from Last 1 Encounters:  12/09/21 128/66  No changes needed Most recent labs reviewed  Disc lifstyle change with low sodium diet and exercise  Plan to continue Amlodipine 5 mg daily  benicar hct 40-12.5 mg daily       Relevant Medications   rosuvastatin (CRESTOR) 5 MG tablet     Endocrine   Controlled type 2 diabetes mellitus without complication, without long-term current use of insulin (HCC) - Primary    New diagnosis  Lab Results  Component Value Date   HGBA1C 6.6 (H) 12/02/2021   Discussed goals for low glycemic diet/exercise  Will start to monitor glucose at different times  Due for annual eye exam in summer  Will start metformin xr 500 mg daily (discussed poss side eff) Will start low dose statin (disc poss side eff) Declines ref to dm teaching at this time Disc imp of foot care, age app imms and handouts given Will get microalb next time  Flu shot today        Relevant Medications   metFORMIN (GLUCOPHAGE-XR) 500 MG 24 hr tablet   rosuvastatin (CRESTOR) 5 MG tablet   Hyperlipidemia associated with type 2 diabetes mellitus (Many)     Disc goals for lipids and reasons to control them Rev last labs with pt Rev low sat fat diet in detail LDL 111 Now diabetic and goal is under 70 if possible  Px crestor 5 mg daily - rev poss side eff Re check at f/u      Relevant Medications   metFORMIN (GLUCOPHAGE-XR) 500 MG 24 hr tablet   rosuvastatin (CRESTOR) 5 MG tablet   Other Visit Diagnoses     Need for influenza vaccination       Relevant Orders   Flu Vaccine QUAD High Dose(Fluad) (Completed)

## 2021-12-10 NOTE — Assessment & Plan Note (Addendum)
New diagnosis  Lab Results  Component Value Date   HGBA1C 6.6 (H) 12/02/2021   Discussed goals for low glycemic diet/exercise  Will start to monitor glucose at different times  Due for annual eye exam in summer  Will start metformin xr 500 mg daily (discussed poss side eff) Will start low dose statin (disc poss side eff) Declines ref to dm teaching at this time Disc imp of foot care, age app imms and handouts given Will get microalb next time  Flu shot today

## 2021-12-10 NOTE — Assessment & Plan Note (Signed)
bp in fair control at this time  BP Readings from Last 1 Encounters:  12/09/21 128/66   No changes needed Most recent labs reviewed  Disc lifstyle change with low sodium diet and exercise  Plan to continue Amlodipine 5 mg daily  benicar hct 40-12.5 mg daily

## 2021-12-10 NOTE — Assessment & Plan Note (Signed)
Disc goals for lipids and reasons to control them Rev last labs with pt Rev low sat fat diet in detail LDL 111 Now diabetic and goal is under 70 if possible  Px crestor 5 mg daily - rev poss side eff Re check at f/u

## 2021-12-24 ENCOUNTER — Ambulatory Visit
Admission: RE | Admit: 2021-12-24 | Discharge: 2021-12-24 | Disposition: A | Discharge status: Home / Self Care | Payer: Medicare Other | Source: Ambulatory Visit | Attending: Family Medicine | Admitting: Family Medicine

## 2021-12-24 DIAGNOSIS — M810 Age-related osteoporosis without current pathological fracture: Secondary | ICD-10-CM | POA: Diagnosis not present

## 2021-12-24 DIAGNOSIS — E2839 Other primary ovarian failure: Secondary | ICD-10-CM

## 2021-12-24 DIAGNOSIS — Z780 Asymptomatic menopausal state: Secondary | ICD-10-CM | POA: Diagnosis not present

## 2021-12-24 DIAGNOSIS — M8589 Other specified disorders of bone density and structure, multiple sites: Secondary | ICD-10-CM | POA: Diagnosis not present

## 2022-02-01 ENCOUNTER — Encounter: Payer: Self-pay | Admitting: Family Medicine

## 2022-02-01 MED ORDER — METFORMIN HCL ER 500 MG PO TB24: 500.0000 mg | ORAL_TABLET | Freq: Every day | ORAL | 1 refills | Status: DC

## 2022-02-09 ENCOUNTER — Other Ambulatory Visit: Payer: Self-pay | Admitting: Family Medicine

## 2022-03-02 ENCOUNTER — Encounter: Payer: Self-pay | Admitting: Family Medicine

## 2022-03-02 DIAGNOSIS — R7989 Other specified abnormal findings of blood chemistry: Secondary | ICD-10-CM

## 2022-03-02 DIAGNOSIS — I10 Essential (primary) hypertension: Secondary | ICD-10-CM

## 2022-03-02 DIAGNOSIS — E119 Type 2 diabetes mellitus without complications: Secondary | ICD-10-CM

## 2022-03-02 DIAGNOSIS — D72818 Other decreased white blood cell count: Secondary | ICD-10-CM

## 2022-03-02 DIAGNOSIS — E1169 Type 2 diabetes mellitus with other specified complication: Secondary | ICD-10-CM

## 2022-03-02 NOTE — Telephone Encounter (Signed)
I put orders in  Please call her and schedule fasting labs

## 2022-03-03 NOTE — Telephone Encounter (Signed)
Spoke to pt, scheduled labs for 03/09/22

## 2022-03-09 ENCOUNTER — Other Ambulatory Visit (INDEPENDENT_AMBULATORY_CARE_PROVIDER_SITE_OTHER): Payer: TRICARE For Life (TFL)

## 2022-03-09 DIAGNOSIS — E785 Hyperlipidemia, unspecified: Secondary | ICD-10-CM | POA: Diagnosis not present

## 2022-03-09 DIAGNOSIS — E119 Type 2 diabetes mellitus without complications: Secondary | ICD-10-CM

## 2022-03-09 DIAGNOSIS — E1169 Type 2 diabetes mellitus with other specified complication: Secondary | ICD-10-CM

## 2022-03-09 DIAGNOSIS — D72818 Other decreased white blood cell count: Secondary | ICD-10-CM | POA: Diagnosis not present

## 2022-03-09 DIAGNOSIS — R7989 Other specified abnormal findings of blood chemistry: Secondary | ICD-10-CM

## 2022-03-09 DIAGNOSIS — I10 Essential (primary) hypertension: Secondary | ICD-10-CM | POA: Diagnosis not present

## 2022-03-09 LAB — CBC WITH DIFFERENTIAL/PLATELET
Basophils Absolute: 0 10*3/uL (ref 0.0–0.1)
Basophils Relative: 0.4 % (ref 0.0–3.0)
Eosinophils Absolute: 0.1 10*3/uL (ref 0.0–0.7)
Eosinophils Relative: 3 % (ref 0.0–5.0)
HCT: 39.3 % (ref 36.0–46.0)
Hemoglobin: 13.3 g/dL (ref 12.0–15.0)
Lymphocytes Relative: 55.5 % — ABNORMAL HIGH (ref 12.0–46.0)
Lymphs Abs: 1.8 10*3/uL (ref 0.7–4.0)
MCHC: 33.7 g/dL (ref 30.0–36.0)
MCV: 93.8 fl (ref 78.0–100.0)
Monocytes Absolute: 0.3 10*3/uL (ref 0.1–1.0)
Monocytes Relative: 8 % (ref 3.0–12.0)
Neutro Abs: 1.1 10*3/uL — ABNORMAL LOW (ref 1.4–7.7)
Neutrophils Relative %: 33.1 % — ABNORMAL LOW (ref 43.0–77.0)
Platelets: 254 10*3/uL (ref 150.0–400.0)
RBC: 4.19 Mil/uL (ref 3.87–5.11)
RDW: 12.8 % (ref 11.5–15.5)
WBC: 3.2 10*3/uL — ABNORMAL LOW (ref 4.0–10.5)

## 2022-03-09 LAB — COMPREHENSIVE METABOLIC PANEL
ALT: 17 U/L (ref 0–35)
AST: 15 U/L (ref 0–37)
Albumin: 4.3 g/dL (ref 3.5–5.2)
Alkaline Phosphatase: 90 U/L (ref 39–117)
BUN: 10 mg/dL (ref 6–23)
CO2: 32 mEq/L (ref 19–32)
Calcium: 9.6 mg/dL (ref 8.4–10.5)
Chloride: 103 mEq/L (ref 96–112)
Creatinine, Ser: 0.66 mg/dL (ref 0.40–1.20)
GFR: 87.52 mL/min (ref >60.00)
Glucose, Bld: 98 mg/dL (ref 70–99)
Potassium: 4.2 mEq/L (ref 3.5–5.1)
Sodium: 141 mEq/L (ref 135–145)
Total Bilirubin: 0.7 mg/dL (ref 0.2–1.2)
Total Protein: 7 g/dL (ref 6.0–8.3)

## 2022-03-09 LAB — MICROALBUMIN / CREATININE URINE RATIO
Creatinine,U: 228 mg/dL
Microalb Creat Ratio: 0.7 mg/g (ref 0.0–30.0)
Microalb, Ur: 1.6 mg/dL (ref 0.0–1.9)

## 2022-03-09 LAB — LIPID PANEL
Cholesterol: 224 mg/dL — ABNORMAL HIGH (ref 0–200)
HDL: 81.3 mg/dL (ref >39.00)
LDL Cholesterol: 127 mg/dL — ABNORMAL HIGH (ref 0–99)
NonHDL: 142.29
Total CHOL/HDL Ratio: 3
Triglycerides: 78 mg/dL (ref 0.0–149.0)
VLDL: 15.6 mg/dL (ref 0.0–40.0)

## 2022-03-09 LAB — HEMOGLOBIN A1C: Hgb A1c MFr Bld: 6.4 % (ref 4.6–6.5)

## 2022-03-09 LAB — TSH: TSH: 4.55 u[IU]/mL (ref 0.35–5.50)

## 2022-03-09 LAB — T4, FREE: Free T4: 0.7 ng/dL (ref 0.60–1.60)

## 2022-03-11 ENCOUNTER — Ambulatory Visit (INDEPENDENT_AMBULATORY_CARE_PROVIDER_SITE_OTHER): Payer: Medicare Other | Admitting: Family Medicine

## 2022-03-11 ENCOUNTER — Encounter: Payer: Self-pay | Admitting: Family Medicine

## 2022-03-11 VITALS — BP 138/80 | HR 77 | Temp 97.5°F | Ht 62.0 in | Wt 130.2 lb

## 2022-03-11 DIAGNOSIS — I10 Essential (primary) hypertension: Secondary | ICD-10-CM

## 2022-03-11 DIAGNOSIS — E785 Hyperlipidemia, unspecified: Secondary | ICD-10-CM

## 2022-03-11 DIAGNOSIS — E1169 Type 2 diabetes mellitus with other specified complication: Secondary | ICD-10-CM

## 2022-03-11 DIAGNOSIS — E119 Type 2 diabetes mellitus without complications: Secondary | ICD-10-CM

## 2022-03-11 MED ORDER — ROSUVASTATIN CALCIUM 5 MG PO TABS: ORAL_TABLET | ORAL | 3 refills | Status: DC

## 2022-03-11 NOTE — Progress Notes (Unsigned)
Subjective:    Patient ID: Jasmine Rollins, female    DOB: April 23, 1950, 72 y.o.   MRN: [id number]  HPI Pt presents for f/u of DM2 and chronic medical problems   Wt Readings from Last 3 Encounters:  03/11/22 130 lb 4 oz (59.1 kg)  12/09/21 130 lb 2 oz (59 kg)  09/08/21 131 lb 9.6 oz (59.7 kg)   23.82 kg/m  Working on diet   Started exercising with bands  Then strained her back after decorating the xmas tree  [place] 2 [place] Now back on track  Also bike and walk   dm2 Lab Results  Component Value Date   HGBA1C 6.4 03/09/2022   This is down from 6.6  Doing well   Metformin xr 500 mg daily  Pt declined dm teaching   Lab Results  Component Value Date   MICROALBUR 1.6 03/09/2022    HTN BP Readings from Last 3 Encounters:  03/11/22 138/80  12/09/21 128/66  09/08/21 114/78   Pulse Readings from Last 3 Encounters:  03/11/22 77  12/09/21 65  09/08/21 75    Amlodipine 5 mg daily  Benicar hct 40-12.5 mg dialy  Lab Results  Component Value Date   CREATININE 0.66 03/09/2022   BUN 10 03/09/2022   NA 141 03/09/2022   K 4.2 03/09/2022   CL 103 03/09/2022   CO2 32 03/09/2022   Gfr 87.5   Cholesterol Lab Results  Component Value Date   CHOL 224 (H) 03/09/2022   CHOL 209 (H) 12/02/2021   CHOL 217 (H) 09/02/2021   Lab Results  Component Value Date   HDL 81.30 03/09/2022   HDL 82.90 12/02/2021   HDL 76.90 09/02/2021   Lab Results  Component Value Date   LDLCALC 127 (H) 03/09/2022   LDLCALC 111 (H) 12/02/2021   LDLCALC 126 (H) 09/02/2021   Lab Results  Component Value Date   TRIG 78.0 03/09/2022   TRIG 72.0 12/02/2021   TRIG 70.0 09/02/2021   Lab Results  Component Value Date   CHOLHDL 3 03/09/2022   CHOLHDL 3 12/02/2021   CHOLHDL 3 09/02/2021   Lab Results  Component Value Date   LDLDIRECT 106.4 02/26/2013   LDLDIRECT 104.2 09/27/2011   LDLDIRECT 86.7 11/27/2007   Crestor 5 mg was px - it gave her some cramps  It never came in  the mail - so she stopped as well  Got better when she stopped it   With eating less sugar may have    Lab Results  Component Value Date   WBC 3.2 (L) 03/09/2022   HGB 13.3 03/09/2022   HCT 39.3 03/09/2022   MCV 93.8 03/09/2022   PLT 254.0 03/09/2022   Lab Results  Component Value Date   TSH 4.55 03/09/2022    Patient Active Problem List   Diagnosis Date Noted   Post herpetic neuralgia 12/02/2020   Scalp pain 12/02/2020   Neck pain 11/20/2020   Insomnia 11/05/2020   Skin lesion 08/05/2020   Right knee pain 11/22/2019   Elevated TSH 12/14/2015   Vitamin D deficiency 12/06/2015   GERD (gastroesophageal reflux disease) 06/09/2014   Encounter for routine gynecological examination 03/03/2014   Estrogen deficiency 03/03/2014   Heartburn 03/01/2013   Gynecological examination 06/22/2010   Special screening for malignant neoplasms, colon 06/22/2010   Glaucoma suspect 06/22/2010   Routine general medical examination at a health care facility 06/03/2010   Sleep apnea    Osteoporosis 05/20/2009  SLEEP APNEA 10/17/2008   COUGH VARIANT ASTHMA 07/30/2008   Leukocytopenia 01/29/2008   Controlled type 2 diabetes mellitus without complication, without long-term current use of insulin (HCC) 11/27/2007   Hyperlipidemia associated with type 2 diabetes mellitus (HCC) 03/21/2007   Essential hypertension 03/21/2007   Sinusitis, chronic 03/21/2007   Allergic rhinitis 03/21/2007   DIVERTICULOSIS, COLON 03/21/2007   Past Medical History:  Diagnosis Date   Allergic rhinitis    Arthritis    hip   Borderline high cholesterol    Cataract    Cough variant asthma    Diverticulosis of colon    GERD (gastroesophageal reflux disease)    HTN (hypertension)    Hyperglycemia    borderline DM   OP (osteoporosis)    Seasonal allergies    Sleep apnea    no cpap   Past Surgical History:  Procedure Laterality Date   COLONOSCOPY     COLPOSCOPY     ESOPHAGOGASTRODUODENOSCOPY  10/01    Negative   ROTATOR CUFF REPAIR     right   TUBAL LIGATION     Social History   Tobacco Use   Smoking status: Never   Smokeless tobacco: Never   Tobacco comments:    Remote 2nd hand exposure  Vaping Use   Vaping Use: Never used  Substance Use Topics   Alcohol use: No    Alcohol/week: 0.0 standard drinks of alcohol   Drug use: No   Family History  Problem Relation Age of Onset   Hypertension Father    Diabetes Father    Diabetes Brother    Diabetes Brother    Cancer Brother        prostate   Cancer Brother        prostate   Cancer Brother        prostate   Cancer Brother        prostate   Cancer Brother        prostate   Cancer Brother        prostate   Breast cancer Maternal Aunt    Colon cancer Neg Hx    Allergies  Allergen Reactions   Augmentin [Amoxicillin-Pot Clavulanate]     GI upset    Sulfonamide Derivatives Rash   Current Outpatient Medications on File Prior to Visit  Medication Sig Dispense Refill   albuterol (PROVENTIL HFA) 108 (90 Base) MCG/ACT inhaler INHALE 2 PUFFS EVERY 4 HOURS AS NEEDED 18 g 3   alendronate (FOSAMAX) 70 MG tablet TAKE 1 TABLET EVERY 7 DAYS WITH A FULL GLASS OF WATER ON AN EMPTY STOMACH 12 tablet 3   amLODipine (NORVASC) 5 MG tablet TAKE 1 TABLET DAILY 90 tablet 1   Calcium Carb-Cholecalciferol 600-800 MG-UNIT TABS Take by mouth.     fluticasone (FLONASE) 50 MCG/ACT nasal spray Place 2 sprays into both nostrils daily. 48 g 3   fluticasone (FLOVENT HFA) 44 MCG/ACT inhaler USE 2 INHALATIONS TWICE A DAY (RINSE MOUTH AFTER USE) 31.8 g 3   glucose blood (FREESTYLE LITE) test strip To check glucose once daily (dx. R73.03) 100 each 1   latanoprost (XALATAN) 0.005 % ophthalmic solution Place 1 drop into both eyes at bedtime.      metFORMIN (GLUCOPHAGE-XR) 500 MG 24 hr tablet Take 1 tablet (500 mg total) by mouth daily with breakfast. 90 tablet 1   montelukast (SINGULAIR) 10 MG tablet TAKE 1 TABLET AT BEDTIME 90 tablet 3    olmesartan-hydrochlorothiazide (BENICAR HCT) 40-12.5 MG tablet Take 1 tablet  by mouth daily. 90 tablet 3   Omega-3 Fatty Acids (FISH OIL PO) Take 1 capsule by mouth daily.     No current facility-administered medications on file prior to visit.      Review of Systems  Constitutional:  Negative for activity change, appetite change, fatigue, fever and unexpected weight change.  HENT:  Negative for congestion, ear pain, rhinorrhea, sinus pressure and sore throat.   Eyes:  Negative for pain, redness and visual disturbance.  Respiratory:  Negative for cough, shortness of breath and wheezing.   Cardiovascular:  Negative for chest pain and palpitations.  Gastrointestinal:  Negative for abdominal pain, blood in stool, constipation and diarrhea.  Endocrine: Negative for polydipsia and polyuria.  Genitourinary:  Negative for dysuria, frequency and urgency.  Musculoskeletal:  Negative for arthralgias, back pain and myalgias.  Skin:  Negative for pallor and rash.  Allergic/Immunologic: Negative for environmental allergies.  Neurological:  Negative for dizziness, syncope and headaches.  Hematological:  Negative for adenopathy. Does not bruise/bleed easily.  Psychiatric/Behavioral:  Negative for decreased concentration and dysphoric mood. The patient is not nervous/anxious.        Objective:   Physical Exam Constitutional:      General: She is not in acute distress.    Appearance: Normal appearance. She is well-developed and normal weight. She is not ill-appearing or diaphoretic.  HENT:     Head: Normocephalic and atraumatic.  Eyes:     Conjunctiva/sclera: Conjunctivae normal.     Pupils: Pupils are equal, round, and reactive to light.  Neck:     Thyroid: No thyromegaly.     Vascular: No carotid bruit or JVD.  Cardiovascular:     Rate and Rhythm: Normal rate and regular rhythm.     Heart sounds: Normal heart sounds.     No gallop.  Pulmonary:     Effort: Pulmonary effort is normal. No  respiratory distress.     Breath sounds: Normal breath sounds. No wheezing or rales.  Abdominal:     General: There is no distension or abdominal bruit.     Palpations: Abdomen is soft.  Musculoskeletal:     Cervical back: Normal range of motion and neck supple.     Right lower leg: No edema.     Left lower leg: No edema.  Lymphadenopathy:     Cervical: No cervical adenopathy.  Skin:    General: Skin is warm and dry.     Coloration: Skin is not jaundiced or pale.     Findings: No rash.  Neurological:     Mental Status: She is alert.     Coordination: Coordination normal.     Deep Tendon Reflexes: Reflexes are normal and symmetric. Reflexes normal.  Psychiatric:        Mood and Affect: Mood normal.           Assessment & Plan:   Problem List Items Addressed This Visit       Cardiovascular and Mediastinum   Essential hypertension    bp in fair control at this time  BP Readings from Last 1 Encounters:  03/11/22 138/80  No changes needed Most recent labs reviewed  Disc lifstyle change with low sodium diet and exercise  Plan to continue Amlodipine 5 mg daily  benicar hct 40-12.5 mg daily       Relevant Medications   rosuvastatin (CRESTOR) 5 MG tablet     Endocrine   Controlled type 2 diabetes mellitus without complication, without long-term current use of  insulin (HCC) - Primary    Lab Results  Component Value Date   HGBA1C 6.4 03/09/2022  This is improved Will continue metformin xr 500 mg daily  Declined DM teaching for now  Disc goals for low glycemic diet and exercise  Microalb in nl range  Disc eye and foot care      Relevant Medications   rosuvastatin (CRESTOR) 5 MG tablet   Hyperlipidemia associated with type 2 diabetes mellitus (HCC)    Disc goals for lipids and reasons to control them Rev last labs with pt Rev low sat fat diet in detail  Last LDL 127 Intol of crestor- causes cramps in legs/body  Disc trial of 5 mg twice weekly  Can also try  co enzyme q 10 If cramps continue then will stop it Plan lipid labs in 6 wk      Relevant Medications   rosuvastatin (CRESTOR) 5 MG tablet   Other Relevant Orders   ALT   AST   Lipid panel

## 2022-03-11 NOTE — Patient Instructions (Addendum)
Try the crestor again twice weekly  If you still have muscle symptoms alert [place] -- we will stop it  Co enzyme Q 10 over the counter as directed may help the side effect   For cholesterol  Avoid red meat/ fried foods/ egg yolks/ fatty breakfast meats/ butter, cheese and high fat dairy/ and shellfish     For diabetes  Try to get most of your carbohydrates from produce (with the exception of white potatoes)  Eat less bread/pasta/rice/snack foods/cereals/sweets and other items from the middle of the grocery store (processed carbs)  Let us know when you want do a diabetic teaching class   Let's check your cholesterol in 6 weeks (lab appt)   Follow up for annual exam in the summer as planned

## 2022-03-13 NOTE — Assessment & Plan Note (Signed)
bp in fair control at this time  BP Readings from Last 1 Encounters:  03/11/22 138/80   No changes needed Most recent labs reviewed  Disc lifstyle change with low sodium diet and exercise  Plan to continue Amlodipine 5 mg daily  benicar hct 40-12.5 mg daily

## 2022-03-13 NOTE — Assessment & Plan Note (Signed)
Lab Results  Component Value Date   HGBA1C 6.4 03/09/2022   This is improved Will continue metformin xr 500 mg daily  Declined DM teaching for now  Disc goals for low glycemic diet and exercise  Microalb in nl range  Disc eye and foot care

## 2022-03-13 NOTE — Assessment & Plan Note (Signed)
Disc goals for lipids and reasons to control them Rev last labs with pt Rev low sat fat diet in detail  Last LDL 127 Intol of crestor- causes cramps in legs/body  Disc trial of 5 mg twice weekly  Can also try co enzyme q 10 If cramps continue then will stop it Plan lipid labs in 6 wk

## 2022-04-15 ENCOUNTER — Other Ambulatory Visit: Payer: Self-pay | Admitting: Family Medicine

## 2022-04-19 ENCOUNTER — Other Ambulatory Visit (INDEPENDENT_AMBULATORY_CARE_PROVIDER_SITE_OTHER): Payer: Medicare PPO

## 2022-04-19 DIAGNOSIS — E785 Hyperlipidemia, unspecified: Secondary | ICD-10-CM

## 2022-04-19 DIAGNOSIS — E1169 Type 2 diabetes mellitus with other specified complication: Secondary | ICD-10-CM

## 2022-04-19 LAB — LIPID PANEL
Cholesterol: 152 mg/dL (ref 0–200)
HDL: 74.8 mg/dL (ref >39.00)
LDL Cholesterol: 64 mg/dL (ref 0–99)
NonHDL: 76.78
Total CHOL/HDL Ratio: 2
Triglycerides: 63 mg/dL (ref 0.0–149.0)
VLDL: 12.6 mg/dL (ref 0.0–40.0)

## 2022-04-19 LAB — ALT: ALT: 20 U/L (ref 0–35)

## 2022-04-19 LAB — AST: AST: 18 U/L (ref 0–37)

## 2022-04-25 LAB — HM DIABETES EYE EXAM

## 2022-05-31 ENCOUNTER — Other Ambulatory Visit: Payer: Self-pay | Admitting: Family Medicine

## 2022-05-31 DIAGNOSIS — Z1231 Encounter for screening mammogram for malignant neoplasm of breast: Secondary | ICD-10-CM

## 2022-06-03 ENCOUNTER — Ambulatory Visit
Admission: RE | Admit: 2022-06-03 | Discharge: 2022-06-03 | Disposition: A | Discharge status: Home / Self Care | Payer: TRICARE For Life (TFL) | Source: Ambulatory Visit | Attending: Family Medicine | Admitting: Family Medicine

## 2022-06-03 DIAGNOSIS — Z1231 Encounter for screening mammogram for malignant neoplasm of breast: Secondary | ICD-10-CM

## 2022-06-21 ENCOUNTER — Encounter: Payer: Self-pay | Admitting: Family Medicine

## 2022-06-22 NOTE — Telephone Encounter (Signed)
Last time we talked daily gave you cramping so we decided to try twice weekly (is that how you are taking it) How are you tolerating it ?

## 2022-06-23 MED ORDER — ROSUVASTATIN CALCIUM 5 MG PO TABS: ORAL_TABLET | ORAL | 0 refills | Status: DC

## 2022-06-23 MED ORDER — ROSUVASTATIN CALCIUM 5 MG PO TABS: ORAL_TABLET | ORAL | 2 refills | Status: DC

## 2022-06-23 NOTE — Telephone Encounter (Signed)
I sent short supply to local pharmacy  90 day supply with refills to mail order  Jasmine Rollins it is going well

## 2022-06-23 NOTE — Telephone Encounter (Signed)
She is taking twice a week. She is not having any symptoms with it. She would like to have short supply sent to local pharmacy.

## 2022-06-27 ENCOUNTER — Other Ambulatory Visit: Payer: Self-pay | Admitting: Family Medicine

## 2022-07-23 ENCOUNTER — Other Ambulatory Visit (HOSPITAL_BASED_OUTPATIENT_CLINIC_OR_DEPARTMENT_OTHER): Payer: Self-pay

## 2022-07-23 ENCOUNTER — Other Ambulatory Visit: Payer: Self-pay

## 2022-07-23 ENCOUNTER — Emergency Department (HOSPITAL_BASED_OUTPATIENT_CLINIC_OR_DEPARTMENT_OTHER): Payer: TRICARE For Life (TFL) | Admitting: Radiology

## 2022-07-23 ENCOUNTER — Encounter (HOSPITAL_BASED_OUTPATIENT_CLINIC_OR_DEPARTMENT_OTHER): Payer: Self-pay

## 2022-07-23 ENCOUNTER — Emergency Department (HOSPITAL_BASED_OUTPATIENT_CLINIC_OR_DEPARTMENT_OTHER)
Admission: EM | Admit: 2022-07-23 | Discharge: 2022-07-23 | Disposition: A | Discharge status: Home / Self Care | Payer: Medicare PPO | Attending: Emergency Medicine | Admitting: Emergency Medicine

## 2022-07-23 DIAGNOSIS — Z79899 Other long term (current) drug therapy: Secondary | ICD-10-CM | POA: Diagnosis not present

## 2022-07-23 DIAGNOSIS — U071 COVID-19: Secondary | ICD-10-CM | POA: Diagnosis not present

## 2022-07-23 DIAGNOSIS — I10 Essential (primary) hypertension: Secondary | ICD-10-CM | POA: Insufficient documentation

## 2022-07-23 DIAGNOSIS — R059 Cough, unspecified: Secondary | ICD-10-CM | POA: Diagnosis present

## 2022-07-23 LAB — RESP PANEL BY RT-PCR (RSV, FLU A&B, COVID)  RVPGX2
Influenza A by PCR: NEGATIVE
Influenza B by PCR: NEGATIVE
Resp Syncytial Virus by PCR: NEGATIVE
SARS Coronavirus 2 by RT PCR: POSITIVE — AB

## 2022-07-23 MED ORDER — FLUTICASONE PROPIONATE 50 MCG/ACT NA SUSP
1.0000 | Freq: Every day | NASAL | 2 refills | Status: DC
  Filled 2022-07-23: qty 16, 30d supply, fill #0

## 2022-07-23 MED ORDER — ONDANSETRON 4 MG PO TBDP
4.0000 mg | ORAL_TABLET | Freq: Three times a day (TID) | ORAL | 0 refills | Status: DC | PRN
  Filled 2022-07-23: qty 20, 7d supply, fill #0

## 2022-07-23 MED ORDER — ACETAMINOPHEN 500 MG PO TABS
1000.0000 mg | ORAL_TABLET | Freq: Once | ORAL | Status: AC
  Administered 2022-07-23: 1000 mg via ORAL
  Filled 2022-07-23: qty 2

## 2022-07-23 MED ORDER — FLUTICASONE PROPIONATE 50 MCG/ACT NA SUSP: 1.0000 | Freq: Every day | NASAL | 2 refills | Status: DC

## 2022-07-23 MED ORDER — PAXLOVID (300/100) 20 X 150 MG & 10 X 100MG PO TBPK
3.0000 | ORAL_TABLET | Freq: Two times a day (BID) | ORAL | 0 refills | Status: AC
  Filled 2022-07-23: qty 30, 5d supply, fill #0

## 2022-07-23 MED ORDER — ONDANSETRON 4 MG PO TBDP: 4.0000 mg | ORAL_TABLET | Freq: Three times a day (TID) | ORAL | 0 refills | Status: DC | PRN

## 2022-07-23 MED ORDER — PAXLOVID (300/100) 20 X 150 MG & 10 X 100MG PO TBPK: 3.0000 | ORAL_TABLET | Freq: Two times a day (BID) | ORAL | 0 refills | Status: DC

## 2022-07-23 NOTE — ED Triage Notes (Signed)
Pt. C/o cough congestion x 4-5 days.

## 2022-07-23 NOTE — Discharge Instructions (Addendum)
Please take your medications as prescribed.  Please discuss with the pharmacist and your PCP before taking Paxlovid.  You can also try DayQuil/NyQuil or TheraFlu for symptom relief. I recommend close follow-up with PCP for reevaluation.  Please do not hesitate to return to emergency department if worrisome signs symptoms we discussed become apparent.  

## 2022-07-23 NOTE — ED Provider Notes (Signed)
[place] EMERGENCY DEPARTMENT AT [facility] Provider Note   CSN: [id number] Arrival date & time: 07/23/22  0751     History  Chief Complaint  Patient presents with   URI    Jasmine Rollins is a 72 y.o. female medical history of GERD, hypertension presents today for evaluation of flulike symptoms.  Patient reports he has had nonproductive cough and nasal congestions since Wednesday.  Husband at home is also sick.  She denies any chest pain or shortness of breath.  Endorses subjective fever at home.  Patient has been trying Mucinex with minimal relief.  Denies any bowel changes, urinary symptoms, rash.   URI     Past Medical History:  Diagnosis Date   Allergic rhinitis    Arthritis    hip   Borderline high cholesterol    Cataract    Cough variant asthma    Diverticulosis of colon    GERD (gastroesophageal reflux disease)    HTN (hypertension)    Hyperglycemia    borderline DM   OP (osteoporosis)    Seasonal allergies    Sleep apnea    no cpap   Past Surgical History:  Procedure Laterality Date   COLONOSCOPY     COLPOSCOPY     ESOPHAGOGASTRODUODENOSCOPY  10/01   Negative   ROTATOR CUFF REPAIR     right   TUBAL LIGATION       Home Medications Prior to Admission medications   Medication Sig Start Date End Date Taking? Authorizing Provider  albuterol (PROVENTIL HFA) 108 (90 Base) MCG/ACT inhaler INHALE 2 PUFFS EVERY 4 HOURS AS NEEDED 08/05/20   Tower, Audrie Gallus, MD  alendronate (FOSAMAX) 70 MG tablet TAKE 1 TABLET EVERY 7 DAYS WITH A FULL GLASS OF WATER ON AN EMPTY STOMACH 08/18/21   Tower, Audrie Gallus, MD  amLODipine (NORVASC) 5 MG tablet TAKE 1 TABLET DAILY 04/15/22   Tower, Audrie Gallus, MD  Calcium Carb-Cholecalciferol 600-800 MG-UNIT TABS Take by mouth.    [provider]  fluticasone (FLONASE) 50 MCG/ACT nasal spray Place 2 sprays into both nostrils daily. 08/05/20   Tower, Audrie Gallus, MD  fluticasone (FLOVENT HFA) 44 MCG/ACT inhaler USE 2 INHALATIONS TWICE A  DAY (RINSE MOUTH AFTER USE) 08/05/20   Tower, Audrie Gallus, MD  glucose blood (FREESTYLE LITE) test strip To check glucose once daily (dx. R73.03) 09/23/20   Tower, Audrie Gallus, MD  latanoprost (XALATAN) 0.005 % ophthalmic solution Place 1 drop into both eyes at bedtime.  04/16/15   [provider]  metFORMIN (GLUCOPHAGE-XR) 500 MG 24 hr tablet TAKE 1 TABLET DAILY WITH BREAKFAST 06/27/22   Tower, Tow A, MD  montelukast (SINGULAIR) 10 MG tablet TAKE 1 TABLET AT BEDTIME 10/08/21   Tower, Audrie Gallus, MD  olmesartan-hydrochlorothiazide (BENICAR HCT) 40-12.5 MG tablet Take 1 tablet by mouth daily. 07/22/21   Tower, Audrie Gallus, MD  Omega-3 Fatty Acids (FISH OIL PO) Take 1 capsule by mouth daily.    [provider]  rosuvastatin (CRESTOR) 5 MG tablet Take one pill by mouth twice weekly 06/23/22   Tower, Audrie Gallus, MD      Allergies    Augmentin [amoxicillin-pot clavulanate] and Sulfonamide derivatives    Review of Systems   Review of Systems Negative except as per HPI.  Physical Exam Updated Vital Signs BP (!) 127/97 (BP Location: Right Arm)   Pulse 88   Temp (!) 100.5 F (38.1 C) (Oral)   Resp 16   SpO2 99%  Physical  Exam Vitals and nursing note reviewed.  Constitutional:      Appearance: Normal appearance.  HENT:     Head: Normocephalic and atraumatic.     Mouth/Throat:     Mouth: Mucous membranes are moist.  Eyes:     General: No scleral icterus. Cardiovascular:     Rate and Rhythm: Normal rate and regular rhythm.     Pulses: Normal pulses.     Heart sounds: Normal heart sounds.  Pulmonary:     Effort: Pulmonary effort is normal.     Breath sounds: Normal breath sounds.  Abdominal:     General: Abdomen is flat.     Palpations: Abdomen is soft.     Tenderness: There is no abdominal tenderness.  Musculoskeletal:        General: No deformity.  Skin:    General: Skin is warm.     Findings: No rash.  Neurological:     General: No focal deficit present.     Mental Status: She is  alert.  Psychiatric:        Mood and Affect: Mood normal.     ED Results / Procedures / Treatments   Labs (all labs ordered are listed, but only abnormal results are displayed) Labs Reviewed  RESP PANEL BY RT-PCR (RSV, FLU A&B, COVID)  [id number]    EKG None  Radiology No results found.  Procedures Procedures    Medications Ordered in ED Medications  acetaminophen (TYLENOL) tablet 1,000 mg (1,000 mg Oral Given 07/23/22 0904)    ED Course/ Medical Decision Making/ A&P                             Medical Decision Making Amount and/or Complexity of Data Reviewed Radiology: ordered.  Risk OTC drugs. Prescription drug management.   This patient presents to the ED for sore throat, body aches, cough, nasal congestion, this involves an extensive number of treatment options, and is a complaint that carries with a high risk of complications and morbidity.  The differential diagnosis includes flu, COVID, RSV, strep, pharyngitis, bronchitis, pneumonia, infectious etiology.  This is not an exhaustive list.  Lab tests: I ordered and personally interpreted labs.  The pertinent results include: Viral panel positive for COVID.  Imaging studies: I ordered and personally interpreted imaging, the results include: Chest x-ray showed no active cardiopulmonary disease.  Problem list/ ED course/ Critical interventions/ Medical management: HPI: See above Vital signs within normal range and stable throughout visit. Laboratory/imaging studies significant for: See above. On physical examination, patient is afebrile and appears in no acute distress. This patient presents with symptoms suspicious for COVID. Based on history and physical doubt sinusitis. COVID test was positive. Do not suspect underlying cardiopulmonary process. I considered, but think unlikely, pneumonia based on chest x-ray. Patient is nontoxic appearing and not in need of emergent medical intervention. Patient told to self  isolate at home until symptoms subside for 72 hours. Recommended patient to take TheraFlu or Mucinex for symptom relief.  I also sent in Rx of Paxlovid, Flonase and Zofran. Follow-up with primary care physician for further evaluation and management.  Return to the ER if new or worsening symptoms. I have reviewed the patient home medicines and have made adjustments as needed.  Cardiac monitoring/EKG: The patient was maintained on a cardiac monitor.  I personally reviewed and interpreted the cardiac monitor which showed an underlying rhythm of: sinus rhythm.  Additional history obtained: External records  from outside source obtained and reviewed including: Chart review including previous notes, labs, imaging.  Consultations obtained:  Disposition Continued outpatient therapy. Follow-up with PCP recommended for reevaluation of symptoms. Treatment plan discussed with patient.  Pt acknowledged understanding was agreeable to the plan. Worrisome signs and symptoms were discussed with patient, and patient acknowledged understanding to return to the ED if they noticed these signs and symptoms. Patient was stable upon discharge.   This chart was dictated using voice recognition software.  Despite best efforts to proofread,  errors can occur which can change the documentation meaning.         Final Clinical Impression(s) / ED Diagnoses Final diagnoses:  COVID    Rx / DC Orders ED Discharge Orders          Ordered    nirmatrelvir & ritonavir (PAXLOVID, 300/100,) 20 x 150 MG & 10 x 100MG  TBPK  2 times daily        07/23/22 1118    ondansetron (ZOFRAN-ODT) 4 MG disintegrating tablet  Every 8 hours PRN        07/23/22 1118    fluticasone (FLONASE) 50 MCG/ACT nasal spray  Daily        07/23/22 1118              Jeanelle Malling, [place] 07/23/22 1126    Mardene Sayer, MD 07/23/22 1914

## 2022-07-25 ENCOUNTER — Ambulatory Visit: Payer: Self-pay

## 2022-07-25 ENCOUNTER — Telehealth: Payer: Self-pay | Admitting: Family Medicine

## 2022-07-25 ENCOUNTER — Telehealth: Payer: Self-pay

## 2022-07-25 NOTE — Telephone Encounter (Signed)
Please hold crestor until she is done with the anti viral Thanks

## 2022-07-25 NOTE — Telephone Encounter (Signed)
Pt notified of Dr. Tower's comments and verbalized understanding  

## 2022-07-25 NOTE — Transitions of Care (Post Inpatient/ED Visit) (Unsigned)
07/25/2022  Name: Jasmine Rollins MRN: [id number] DOB: 26-Aug-1950  Today's TOC FU Call Status: Today's TOC FU Call Status:: Successful TOC FU Call Competed  Transition Care Management Follow-up Telephone Call Date of Discharge: 07/22/08 Discharge Facility: [facility] Type of Discharge: Emergency Department Reason for ED Visit: Other: How have you been since you were released from the hospital?: Better Any questions or concerns?: No  Items Reviewed: Did you receive and understand the discharge instructions provided?: Yes Medications obtained,verified, and reconciled?: Yes (Medications Reviewed) Any new allergies since your discharge?: No Dietary orders reviewed?: NA Do you have support at home?: Yes People in Home: spouse  Medications Reviewed Today: Medications Reviewed Today     Reviewed by Annabell Sabal, CMA (Certified Medical Assistant) on 07/25/22 at 1213  Med List Status: <None>   Medication Order Taking? Sig Documenting Provider Last Dose Status Informant  albuterol (PROVENTIL HFA) 108 (90 Base) MCG/ACT inhaler [id number] Yes INHALE 2 PUFFS EVERY 4 HOURS AS NEEDED Tower, Audrie Gallus, MD Taking Active   alendronate (FOSAMAX) 70 MG tablet [id number] Yes TAKE 1 TABLET EVERY 7 DAYS WITH A FULL GLASS OF WATER ON AN EMPTY STOMACH Tower, Audrie Gallus, MD Taking Active   amLODipine (NORVASC) 5 MG tablet [id number] Yes TAKE 1 TABLET DAILY Tower, Audrie Gallus, MD Taking Active   Calcium Carb-Cholecalciferol 600-800 MG-UNIT TABS [id number] Yes Take by mouth. [provider] Taking Active Self  fluticasone (FLONASE) 50 MCG/ACT nasal spray [id number] Yes Place 2 sprays into both nostrils daily. Tower, Audrie Gallus, MD Taking Active   fluticasone [facility]) 50 MCG/ACT nasal spray [id number] Yes Place 1 spray into both nostrils daily. Jeanelle Malling, PA Taking Active   fluticasone (FLOVENT HFA) 44 MCG/ACT inhaler [id number] Yes USE 2 INHALATIONS TWICE A DAY (RINSE MOUTH AFTER USE) Tower, Audrie Gallus,  MD Taking Active   glucose blood (FREESTYLE LITE) test strip [id number] Yes To check glucose once daily (dx. R73.03) Tower, Audrie Gallus, MD Taking Active   latanoprost (XALATAN) 0.005 % ophthalmic solution [id number] Yes Place 1 drop into both eyes at bedtime.  [provider] Taking Active Self           Med Note Montez Morita, CRYSTAL D   Wed Oct 21, 2015 12:24 AM)    metFORMIN (GLUCOPHAGE-XR) 500 MG 24 hr tablet [id number] Yes TAKE 1 TABLET DAILY WITH BREAKFAST Tower, Audrie Gallus, MD Taking Active   montelukast (SINGULAIR) 10 MG tablet [id number] Yes TAKE 1 TABLET AT BEDTIME Tower, Audrie Gallus, MD Taking Active   nirmatrelvir & ritonavir (PAXLOVID, 300/100,) 20 x 150 MG & 10 x 100MG  TBPK [id number] Yes Take 3 tablets by mouth 2 (two) times daily for 5 days. Jeanelle Malling, PA Taking Active   olmesartan-hydrochlorothiazide (BENICAR HCT) 40-12.5 MG tablet [id number] Yes Take 1 tablet by mouth daily. Tower, Audrie Gallus, MD Taking Active   Omega-3 Fatty Acids (FISH OIL PO) [id number] Yes Take 1 capsule by mouth daily. [provider] Taking Active   ondansetron (ZOFRAN-ODT) 4 MG disintegrating tablet [id number] Yes Take 1 tablet (4 mg total) by mouth every 8 (eight) hours as needed. Jeanelle Malling, PA Taking Active   rosuvastatin (CRESTOR) 5 MG tablet [id number] Yes Take one pill by mouth twice weekly Tower, Audrie Gallus, MD Taking Active             Home Care and Equipment/Supplies: Were Home Health Services Ordered?: No Any new equipment or medical supplies ordered?: No  Functional Questionnaire: Do you need assistance with bathing/showering or dressing?:  No Do you need assistance with meal preparation?: No Do you need assistance with eating?: No Do you have difficulty maintaining continence: No Do you need assistance with getting out of bed/getting out of a chair/moving?: No Do you have difficulty managing or taking your medications?: No  Follow up appointments reviewed: PCP Follow-up appointment confirmed?:  Yes Date of PCP follow-up appointment?: 08/02/22 Follow-up Provider: tower Specialist [facility] Follow-up appointment confirmed?: No Reason Specialist Follow-Up Not Confirmed: Patient has Specialist Provider Number and will Call for Appointment Do you need transportation to your follow-up appointment?: No Do you understand care options if your condition(s) worsen?: Yes-patient verbalized understanding    SIGNATURE Fredirick Maudlin

## 2022-07-25 NOTE — Chronic Care Management (AMB) (Signed)
   07/25/2022  SEIDY LABRECK 1950-05-15 [id number]   Reason for Encounter: Patient is not currently enrolled in the CCM program. CCM status changed to previously enrolled  Alto Denver RN, MSN, CCM RN Care Manager  Chronic Care Management Direct Number: [phone]

## 2022-07-25 NOTE — Telephone Encounter (Signed)
Patient was prescribed paxlovid for covid,she would like to know if its okay for her to take this with  rosuvastatin (CRESTOR) 5 MG tablet? Please advise

## 2022-08-02 ENCOUNTER — Ambulatory Visit (INDEPENDENT_AMBULATORY_CARE_PROVIDER_SITE_OTHER): Payer: Medicare PPO

## 2022-08-02 VITALS — Ht 62.5 in | Wt 130.0 lb

## 2022-08-02 DIAGNOSIS — Z0000 Encounter for general adult medical examination without abnormal findings: Secondary | ICD-10-CM | POA: Diagnosis not present

## 2022-08-02 NOTE — Patient Instructions (Addendum)
[name] , Thank you for taking time to come for your Medicare Wellness Visit. I appreciate your ongoing commitment to your health goals. Please review the following plan we discussed and let me know if I can assist you in the future.   These are the goals we discussed:  Goals      DIET - EAT MORE FRUITS AND VEGETABLES     Patient Stated     Starting 10/25/2017, I will continue to take medications as prescribed.      Patient Stated     11/06/2018, Patient wants to take more time for herself and doing things she likes.      Patient Stated     07/27/2020, I will continue to ride my stationary bike 3 days a week for 30 minutes.      Patient Stated     Be more healthy        This is a list of the screening recommended for you and due dates:  Health Maintenance  Topic Date Due   DTaP/Tdap/Td vaccine (3 - Td or Tdap) 06/21/2020   COVID-19 Vaccine (5 - 2023-24 season) 10/15/2021   Hemoglobin A1C  09/07/2022   Flu Shot  09/15/2022   Complete foot exam   12/10/2022   Yearly kidney function blood test for diabetes  03/10/2023   Yearly kidney health urinalysis for diabetes  03/10/2023   Eye exam for diabetics  04/25/2023   Medicare Annual Wellness Visit  08/02/2023   Mammogram  06/02/2024   Colon Cancer Screening  06/10/2025   Pneumonia Vaccine  Completed   DEXA scan (bone density measurement)  Completed   Hepatitis C Screening  Completed   Zoster (Shingles) Vaccine  Completed   HPV Vaccine  Aged Out      Advanced directives: Please bring a copy of your health care power of attorney and living will to the office to be added to your chart at your convenience.   Conditions/risks identified: none  Next appointment: Follow up in one year for your annual wellness visit 08/03/23 @ 9:15 televisit   Preventive Care 65 Years and Older, Female Preventive care refers to lifestyle choices and visits with your health care provider that can promote health and wellness. What does preventive  care include? A yearly physical exam. This is also called an annual well check. Dental exams once or twice a year. Routine eye exams. Ask your health care provider how often you should have your eyes checked. Personal lifestyle choices, including: Daily care of your teeth and gums. Regular physical activity. Eating a healthy diet. Avoiding tobacco and drug use. Limiting alcohol use. Practicing safe sex. Taking low-dose aspirin every day. Taking vitamin and mineral supplements as recommended by your health care provider. What happens during an annual well check? The services and screenings done by your health care provider during your annual well check will depend on your age, overall health, lifestyle risk factors, and family history of disease. Counseling  Your health care provider may ask you questions about your: Alcohol use. Tobacco use. Drug use. Emotional well-being. Home and relationship well-being. Sexual activity. Eating habits. History of falls. Memory and ability to understand (cognition). Work and work Astronomer. Reproductive health. Screening  You may have the following tests or measurements: Height, weight, and BMI. Blood pressure. Lipid and cholesterol levels. These may be checked every 5 years, or more frequently if you are over 94 years old. Skin check. Lung cancer screening. You may have this screening every  year starting at age 63 if you have a 30-pack-year history of smoking and currently smoke or have quit within the past 15 years. Fecal occult blood test (FOBT) of the stool. You may have this test every year starting at age 2. Flexible sigmoidoscopy or colonoscopy. You may have a sigmoidoscopy every 5 years or a colonoscopy every 10 years starting at age 25. Hepatitis C blood test. Hepatitis B blood test. Sexually transmitted disease (STD) testing. Diabetes screening. This is done by checking your blood sugar (glucose) after you have not eaten for a  while (fasting). You may have this done every 1-3 years. Bone density scan. This is done to screen for osteoporosis. You may have this done starting at age 24. Mammogram. This may be done every 1-2 years. Talk to your health care provider about how often you should have regular mammograms. Talk with your health care provider about your test results, treatment options, and if necessary, the need for more tests. Vaccines  Your health care provider may recommend certain vaccines, such as: Influenza vaccine. This is recommended every year. Tetanus, diphtheria, and acellular pertussis (Tdap, Td) vaccine. You may need a Td booster every 10 years. Zoster vaccine. You may need this after age 67. Pneumococcal 13-valent conjugate (PCV13) vaccine. One dose is recommended after age 62. Pneumococcal polysaccharide (PPSV23) vaccine. One dose is recommended after age 37. Talk to your health care provider about which screenings and vaccines you need and how often you need them. This information is not intended to replace advice given to you by your health care provider. Make sure you discuss any questions you have with your health care provider. Document Released: 02/27/2015 Document Revised: 10/21/2015 Document Reviewed: 12/02/2014 Elsevier Interactive Patient Education  2017 [employer].  Fall Prevention in the Home Falls can cause injuries. They can happen to people of all ages. There are many things you can do to make your home safe and to help prevent falls. What can I do on the outside of my home? Regularly fix the edges of walkways and driveways and fix any cracks. Remove anything that might make you trip as you walk through a door, such as a raised step or threshold. Trim any bushes or trees on the path to your home. Use bright outdoor lighting. Clear any walking paths of anything that might make someone trip, such as rocks or tools. Regularly check to see if handrails are loose or broken. Make  sure that both sides of any steps have handrails. Any raised decks and porches should have guardrails on the edges. Have any leaves, snow, or ice cleared regularly. Use sand or salt on walking paths during winter. Clean up any spills in your garage right away. This includes oil or grease spills. What can I do in the bathroom? Use night lights. Install grab bars by the toilet and in the tub and shower. Do not use towel bars as grab bars. Use non-skid mats or decals in the tub or shower. If you need to sit down in the shower, use a plastic, non-slip stool. Keep the floor dry. Clean up any water that spills on the floor as soon as it happens. Remove soap buildup in the tub or shower regularly. Attach bath mats securely with double-sided non-slip rug tape. Do not have throw rugs and other things on the floor that can make you trip. What can I do in the bedroom? Use night lights. Make sure that you have a light by your bed that  is easy to reach. Do not use any sheets or blankets that are too big for your bed. They should not hang down onto the floor. Have a firm chair that has side arms. You can use this for support while you get dressed. Do not have throw rugs and other things on the floor that can make you trip. What can I do in the kitchen? Clean up any spills right away. Avoid walking on wet floors. Keep items that you use a lot in easy-to-reach places. If you need to reach something above you, use a strong step stool that has a grab bar. Keep electrical cords out of the way. Do not use floor polish or wax that makes floors slippery. If you must use wax, use non-skid floor wax. Do not have throw rugs and other things on the floor that can make you trip. What can I do with my stairs? Do not leave any items on the stairs. Make sure that there are handrails on both sides of the stairs and use them. Fix handrails that are broken or loose. Make sure that handrails are as long as the  stairways. Check any carpeting to make sure that it is firmly attached to the stairs. Fix any carpet that is loose or worn. Avoid having throw rugs at the top or bottom of the stairs. If you do have throw rugs, attach them to the floor with carpet tape. Make sure that you have a light switch at the top of the stairs and the bottom of the stairs. If you do not have them, ask someone to add them for you. What else can I do to help prevent falls? Wear shoes that: Do not have high heels. Have rubber bottoms. Are comfortable and fit you well. Are closed at the toe. Do not wear sandals. If you use a stepladder: Make sure that it is fully opened. Do not climb a closed stepladder. Make sure that both sides of the stepladder are locked into place. Ask someone to hold it for you, if possible. Clearly mark and make sure that you can see: Any grab bars or handrails. First and last steps. Where the edge of each step is. Use tools that help you move around (mobility aids) if they are needed. These include: Canes. Walkers. Scooters. Crutches. Turn on the lights when you go into a dark area. Replace any light bulbs as soon as they burn out. Set up your furniture so you have a clear path. Avoid moving your furniture around. If any of your floors are uneven, fix them. If there are any pets around you, be aware of where they are. Review your medicines with your doctor. Some medicines can make you feel dizzy. This can increase your chance of falling. Ask your doctor what other things that you can do to help prevent falls. This information is not intended to replace advice given to you by your health care provider. Make sure you discuss any questions you have with your health care provider. Document Released: 11/27/2008 Document Revised: 07/09/2015 Document Reviewed: 03/07/2014 Elsevier Interactive Patient Education  2017 [employer].

## 2022-08-02 NOTE — Progress Notes (Signed)
Subjective:   Jasmine Rollins is a 72 y.o. female who presents for Medicare Annual (Subsequent) preventive examination.  I connected with  [name] on 08/02/22 by a audio enabled telemedicine application and verified that I am speaking with the correct person using two identifiers.  Patient Location: Home  Provider Location: Office/Clinic  I discussed the limitations of evaluation and management by telemedicine. The patient expressed understanding and agreed to proceed.   Review of Systems      Cardiac Risk Factors include: advanced age (>28men, >25 women);hypertension;diabetes mellitus     Objective:    Today's Vitals   08/02/22 1009 08/02/22 1010  Weight: 130 lb (59 kg)   Height: 5' 2.5" (1.588 m)   PainSc:  4    Body mass index is 23.4 kg/m.     08/02/2022   10:20 AM 07/23/2022    8:28 AM 07/28/2021   11:01 AM 12/30/2020   11:25 AM 07/27/2020    1:16 PM 11/06/2018    9:54 AM 10/25/2017    8:29 AM  Advanced Directives  Does Patient Have a Medical Advance Directive? Yes No No No No No No  Type of Estate agent of [place];Living will        Does patient want to make changes to medical advance directive?     No - Patient declined    Copy of Healthcare Power of Attorney in Chart? No - copy requested        Would patient like information on creating a medical advance directive?  No - Patient declined No - Patient declined Yes (MAU/Ambulatory/Procedural Areas - Information given)  Yes (MAU/Ambulatory/Procedural Areas - Information given) Yes (MAU/Ambulatory/Procedural Areas - Information given)    Current Medications (verified) Outpatient Encounter Medications as of 08/02/2022  Medication Sig   albuterol (PROVENTIL HFA) 108 (90 Base) MCG/ACT inhaler INHALE 2 PUFFS EVERY 4 HOURS AS NEEDED   alendronate (FOSAMAX) 70 MG tablet TAKE 1 TABLET EVERY 7 DAYS WITH A FULL GLASS OF WATER ON AN EMPTY STOMACH   amLODipine (NORVASC) 5 MG tablet TAKE 1 TABLET DAILY    Calcium Carb-Cholecalciferol 600-800 MG-UNIT TABS Take by mouth.   fluticasone (FLONASE) 50 MCG/ACT nasal spray Place 2 sprays into both nostrils daily.   fluticasone (FLONASE) 50 MCG/ACT nasal spray Place 1 spray into both nostrils daily.   fluticasone (FLOVENT HFA) 44 MCG/ACT inhaler USE 2 INHALATIONS TWICE A DAY (RINSE MOUTH AFTER USE)   glucose blood (FREESTYLE LITE) test strip To check glucose once daily (dx. R73.03)   metFORMIN (GLUCOPHAGE-XR) 500 MG 24 hr tablet TAKE 1 TABLET DAILY WITH BREAKFAST   montelukast (SINGULAIR) 10 MG tablet TAKE 1 TABLET AT BEDTIME   olmesartan-hydrochlorothiazide (BENICAR HCT) 40-12.5 MG tablet Take 1 tablet by mouth daily.   ROCKLATAN 0.02-0.005 % SOLN    latanoprost (XALATAN) 0.005 % ophthalmic solution Place 1 drop into both eyes at bedtime.  (Patient not taking: Reported on 08/02/2022)   Omega-3 Fatty Acids (FISH OIL PO) Take 1 capsule by mouth daily. (Patient not taking: Reported on 08/02/2022)   ondansetron (ZOFRAN-ODT) 4 MG disintegrating tablet Take 1 tablet (4 mg total) by mouth every 8 (eight) hours as needed. (Patient not taking: Reported on 08/02/2022)   rosuvastatin (CRESTOR) 5 MG tablet Take one pill by mouth twice weekly (Patient not taking: Reported on 08/02/2022)   No facility-administered encounter medications on file as of 08/02/2022.    Allergies (verified) Augmentin [amoxicillin-pot clavulanate] and Sulfonamide derivatives   History:  Past Medical History:  Diagnosis Date   Allergic rhinitis    Arthritis    hip   Borderline high cholesterol    Cataract    Cough variant asthma    Diverticulosis of colon    GERD (gastroesophageal reflux disease)    HTN (hypertension)    Hyperglycemia    borderline DM   OP (osteoporosis)    Seasonal allergies    Sleep apnea    no cpap   Past Surgical History:  Procedure Laterality Date   COLONOSCOPY     COLPOSCOPY     ESOPHAGOGASTRODUODENOSCOPY  10/01   Negative   ROTATOR CUFF REPAIR      right   TUBAL LIGATION     Family History  Problem Relation Age of Onset   Hypertension Father    Diabetes Father    Diabetes Brother    Diabetes Brother    Cancer Brother        prostate   Cancer Brother        prostate   Cancer Brother        prostate   Cancer Brother        prostate   Cancer Brother        prostate   Cancer Brother        prostate   Breast cancer Maternal Aunt    Colon cancer Neg Hx    Social History   Socioeconomic History   Marital status: Married    Spouse name: Not on file   Number of children: 1   Years of education: Not on file   Highest education level: Not on file  Occupational History   Occupation: Customer Service  Tobacco Use   Smoking status: Never   Smokeless tobacco: Never   Tobacco comments:    Remote 2nd hand exposure  Vaping Use   Vaping Use: Never used  Substance and Sexual Activity   Alcohol use: No    Alcohol/week: 0.0 standard drinks of alcohol   Drug use: No   Sexual activity: Yes  Other Topics Concern   Not on file  Social History Narrative   Not on file   Social Determinants of Health   Financial Resource Strain: Low Risk  (08/02/2022)   Overall Financial Resource Strain (CARDIA)    Difficulty of Paying Living Expenses: Not hard at all  Food Insecurity: No Food Insecurity (08/02/2022)   Hunger Vital Sign    Worried About Running Out of Food in the Last Year: Never true    Ran Out of Food in the Last Year: Never true  Transportation Needs: No Transportation Needs (08/02/2022)   PRAPARE - Administrator, Civil Service (Medical): No    Lack of Transportation (Non-Medical): No  Physical Activity: Insufficiently Active (08/02/2022)   Exercise Vital Sign    Days of Exercise per Week: 4 days    Minutes of Exercise per Session: 30 min  Stress: No Stress Concern Present (08/02/2022)   [employer] of Occupational Health - Occupational Stress Questionnaire    Feeling of Stress : Not at all  Social  Connections: Moderately Integrated (08/02/2022)   Social Connection and Isolation Panel [NHANES]    Frequency of Communication with Friends and Family: More than three times a week    Frequency of Social Gatherings with Friends and Family: More than three times a week    Attends Religious Services: More than 4 times per year    Active Member of [employer] or Organizations:  No    Attends Banker Meetings: Never    Marital Status: Married    Tobacco Counseling Counseling given: Not Answered Tobacco comments: Remote 2nd hand exposure   Clinical Intake:  Pre-visit preparation completed: Yes  Pain : 0-10 Pain Score: 4  Pain Type: Acute pain Pain Location: Hand Pain Orientation: Left Pain Descriptors / Indicators: Throbbing, Aching Pain Onset: More than a month ago     Nutritional Risks: None Diabetes: Yes CBG done?: Yes (113 per pt) CBG resulted in Enter/ Edit results?: No Did pt. bring in CBG monitor from home?: No  How often do you need to have someone help you when you read instructions, pamphlets, or other written materials from your doctor or pharmacy?: 1 - Never  Interpreter Needed?: No  Information entered by :: C.Rogina Schiano LPN   Activities of Daily Living    08/02/2022   10:22 AM  In your present state of health, do you have any difficulty performing the following activities:  Hearing? 0  Vision? 0  Difficulty concentrating or making decisions? 1  Comment occasionally forgets  Walking or climbing stairs? 0  Dressing or bathing? 0  Doing errands, shopping? 0  Preparing Food and eating ? N  Using the Toilet? N  In the past six months, have you accidently leaked urine? N  Do you have problems with loss of bowel control? N  Managing your Medications? N  Managing your Finances? N  Housekeeping or managing your Housekeeping? N    Patient Care Team: Tower, Audrie Gallus, MD as PCP - Cherlynn Polo, MD as Consulting Physician (Ophthalmology) Vilinda Flake, [facility] as Pharmacist (Pharmacist)  Indicate any recent Medical Services you may have received from other than [facility] providers in the past year (date may be approximate).     Assessment:   This is a routine wellness examination for Jamya.  Hearing/Vision screen Hearing Screening - Comments:: No issues Vision Screening - Comments:: Glasses - [facility] - Cataract surgery 09/07/22  Dietary issues and exercise activities discussed:     Goals Addressed             This Visit's Progress    Patient Stated       Be more healthy       Depression Screen    08/02/2022   10:20 AM 03/11/2022    9:28 AM 09/08/2021    8:34 AM 07/28/2021   10:58 AM 07/27/2020    1:20 PM 01/14/2020    9:20 AM 11/06/2018    9:57 AM  PHQ 2/9 Scores  PHQ - 2 Score 0 0 0 0 0 0 0  PHQ- 9 Score  2 0 0 0 1 0    Fall Risk    08/02/2022   10:22 AM 03/11/2022    9:27 AM 09/08/2021    8:34 AM 07/28/2021   11:02 AM 07/27/2020    1:18 PM  Fall Risk   Falls in the past year? 0 0 0 0 0  Number falls in past yr: 0 0  0 0  Injury with Fall? 0 0  0 0  Risk for fall due to : No Fall Risks No Fall Risks  No Fall Risks Medication side effect  Follow up Falls prevention discussed;Falls evaluation completed Falls evaluation completed Falls evaluation completed Falls evaluation completed Falls evaluation completed;Falls prevention discussed    MEDICARE RISK AT HOME:  Medicare Risk at Home - 08/02/22 1025     Any stairs in or  around the home? Yes    If so, are there any without handrails? Yes    Home free of loose throw rugs in walkways, pet beds, electrical cords, etc? Yes    Adequate lighting in your home to reduce risk of falls? Yes    Life alert? No    Use of a cane, walker or w/c? No    Grab bars in the bathroom? Yes    Shower chair or bench in shower? Yes    Elevated toilet seat or a handicapped toilet? Yes                 Cognitive Function:    07/27/2020    1:22 PM 11/06/2018    10:02 AM 10/25/2017    8:04 AM 12/09/2015    8:55 AM  MMSE - Mini Mental State Exam  Orientation to time 5 5 5 5   Orientation to Place 5 5 5 5   Registration 3 3 3 3   Attention/ Calculation 5 5 0 0  Recall 3 3 3 3   Language- name 2 objects   0 0  Language- repeat 1 1 1 1   Language- follow 3 step command   3 3  Language- read & follow direction   0 0  Write a sentence   0 0  Copy design   0 0  Total score   20 20        08/02/2022   10:23 AM 07/28/2021   11:04 AM  6CIT Screen  What Year? 0 points 0 points  What month? 0 points 0 points  What time? 0 points 0 points  Count back from 20 0 points 0 points  Months in reverse 0 points 0 points  Repeat phrase 0 points 0 points  Total Score 0 points 0 points    Immunizations Immunization History  Administered Date(s) Administered   Fluad Quad(high Dose 65+) 12/10/2019, 12/09/2021   Influenza Whole 11/12/2008, 11/05/2009   Influenza, High Dose Seasonal PF 11/13/2014, 11/03/2017, 10/16/2018   Influenza,inj,Quad PF,6+ Mos 11/03/2016   Influenza-Unspecified 11/28/2012, 11/15/2013, 11/03/2016, 11/26/2020   PFIZER(Purple Top)SARS-COV-2 Vaccination 03/27/2019, 07/08/2019, 01/30/2020   Pfizer Covid-19 Vaccine Bivalent Booster 36yrs & up 05/25/2021   Pneumococcal Conjugate-13 12/09/2015   Pneumococcal Polysaccharide-23 03/19/2009, 01/25/2017   Td 05/15/2001   Tdap 06/22/2010   Zoster Recombinat (Shingrix) 09/15/2019, 11/14/2019   Zoster, Live 08/05/2010    TDAP status: Due, Education has been provided regarding the importance of this vaccine. Advised may receive this vaccine at local pharmacy or Health Dept. Aware to provide a copy of the vaccination record if obtained from local pharmacy or Health Dept. Verbalized acceptance and understanding.  Flu Vaccine status: Up to date  Pneumococcal vaccine status: Up to date  Covid-19 vaccine status: Information provided on how to obtain vaccines.   Qualifies for Shingles Vaccine? Yes    Zostavax completed Yes   Shingrix Completed?: Yes  Screening Tests Health Maintenance  Topic Date Due   DTaP/Tdap/Td (3 - Td or Tdap) 06/21/2020   COVID-19 Vaccine (5 - 2023-24 season) 10/15/2021   HEMOGLOBIN A1C  09/07/2022   INFLUENZA VACCINE  09/15/2022   FOOT EXAM  12/10/2022   Diabetic kidney evaluation - eGFR measurement  03/10/2023   Diabetic kidney evaluation - Urine ACR  03/10/2023   OPHTHALMOLOGY EXAM  04/25/2023   Medicare Annual Wellness (AWV)  08/02/2023   MAMMOGRAM  06/02/2024   Colonoscopy  06/10/2025   Pneumonia Vaccine 42+ Years old  Completed   DEXA SCAN  Completed   Hepatitis C Screening  Completed   Zoster Vaccines- Shingrix  Completed   HPV VACCINES  Aged Out    Health Maintenance  Health Maintenance Due  Topic Date Due   DTaP/Tdap/Td (3 - Td or Tdap) 06/21/2020   COVID-19 Vaccine (5 - 2023-24 season) 10/15/2021    Colorectal cancer screening: Type of screening: Colonoscopy. Completed 06/11/15. Repeat every 10 years  Mammogram status: Completed 06/03/22. Repeat every year  Bone Density status: Completed 12/24/21. Results reflect: Bone density results: OSTEOPOROSIS. Repeat every 2 years.  Lung Cancer Screening: (Low Dose CT Chest recommended if Age 61-80 years, 20 pack-year currently smoking OR have quit w/in 15years.) does not qualify.   Lung Cancer Screening Referral: no  Additional Screening:  Hepatitis C Screening: does qualify; Completed 06/08/15  Vision Screening: Recommended annual ophthalmology exams for early detection of glaucoma and other disorders of the eye. Is the patient up to date with their annual eye exam?  Yes  Who is the provider or what is the name of the office in which the patient attends annual eye exams? [facility] If pt is not established with a provider, would they like to be referred to a provider to establish care? Yes .   Dental Screening: Recommended annual dental exams for proper oral hygiene  Diabetic Foot Exam:  Diabetic Foot Exam: Completed 12/09/21 PCP  Community Resource Referral / Chronic Care Management: CRR required this visit?  No   CCM required this visit?  No     Plan:     I have personally reviewed and noted the following in the patient's chart:   Medical and social history Use of alcohol, tobacco or illicit drugs  Current medications and supplements including opioid prescriptions. Patient is not currently taking opioid prescriptions. Functional ability and status Nutritional status Physical activity Advanced directives List of other physicians Hospitalizations, surgeries, and ER visits in previous 12 months Vitals Screenings to include cognitive, depression, and falls Referrals and appointments  In addition, I have reviewed and discussed with patient certain preventive protocols, quality metrics, and best practice recommendations. A written personalized care plan for preventive services as well as general preventive health recommendations were provided to patient.     Maryan Puls, LPN   1/61/0960   After Visit Summary: (Declined) Due to this being a telephonic visit, with patients personalized plan was offered to patient but patient Declined AVS at this time   Nurse Notes: none

## 2022-08-03 ENCOUNTER — Ambulatory Visit (INDEPENDENT_AMBULATORY_CARE_PROVIDER_SITE_OTHER)
Admission: RE | Admit: 2022-08-03 | Discharge: 2022-08-03 | Disposition: A | Discharge status: Home / Self Care | Payer: Medicare PPO | Source: Ambulatory Visit | Attending: Internal Medicine | Admitting: Internal Medicine

## 2022-08-03 ENCOUNTER — Ambulatory Visit (INDEPENDENT_AMBULATORY_CARE_PROVIDER_SITE_OTHER): Payer: Medicare PPO | Admitting: Internal Medicine

## 2022-08-03 ENCOUNTER — Encounter: Payer: Self-pay | Admitting: Internal Medicine

## 2022-08-03 VITALS — BP 128/84 | HR 87 | Temp 97.9°F | Ht 62.0 in | Wt 126.0 lb

## 2022-08-03 DIAGNOSIS — M25532 Pain in left wrist: Secondary | ICD-10-CM

## 2022-08-03 NOTE — Progress Notes (Signed)
Subjective:    Patient ID: Jasmine Rollins, female    DOB: 05/24/1950, 72 y.o.   MRN: [id number]  HPI Here due to trouble with her left hand/wrist  Feels her hands are weak--nothing new Does do exercises--was working with 3# weights for hands----left hand felt too weak but did try it (2 days ago) Noticed swelling after that --came on overnight Now has pain up volar forearm Pain in thumb and wrist also No other finger pain  Has worn splint in the past Slept in it after this episode--then noted the swelling Some chronic problems with left thumb--can't bend normally  Tried advil 400mg  three times yesterday--did help Tylenol didn't help Used topical diclofenac also helps  Current Outpatient Medications on File Prior to Visit  Medication Sig Dispense Refill   albuterol (PROVENTIL HFA) 108 (90 Base) MCG/ACT inhaler INHALE 2 PUFFS EVERY 4 HOURS AS NEEDED 18 g 3   alendronate (FOSAMAX) 70 MG tablet TAKE 1 TABLET EVERY 7 DAYS WITH A FULL GLASS OF WATER ON AN EMPTY STOMACH 12 tablet 3   amLODipine (NORVASC) 5 MG tablet TAKE 1 TABLET DAILY 90 tablet 1   Calcium Carb-Cholecalciferol 600-800 MG-UNIT TABS Take by mouth.     fluticasone (FLONASE) 50 MCG/ACT nasal spray Place 1 spray into both nostrils daily. 16 g 2   fluticasone (FLOVENT HFA) 44 MCG/ACT inhaler USE 2 INHALATIONS TWICE A DAY (RINSE MOUTH AFTER USE) 31.8 g 3   glucose blood (FREESTYLE LITE) test strip To check glucose once daily (dx. R73.03) 100 each 1   latanoprost (XALATAN) 0.005 % ophthalmic solution Place 1 drop into both eyes at bedtime.     metFORMIN (GLUCOPHAGE-XR) 500 MG 24 hr tablet TAKE 1 TABLET DAILY WITH BREAKFAST 90 tablet 0   montelukast (SINGULAIR) 10 MG tablet TAKE 1 TABLET AT BEDTIME 90 tablet 3   olmesartan-hydrochlorothiazide (BENICAR HCT) 40-12.5 MG tablet Take 1 tablet by mouth daily. 90 tablet 3   Omega-3 Fatty Acids (FISH OIL PO) Take 1 capsule by mouth daily.     ondansetron (ZOFRAN-ODT) 4 MG disintegrating  tablet Take 1 tablet (4 mg total) by mouth every 8 (eight) hours as needed. 20 tablet 0   ROCKLATAN 0.02-0.005 % SOLN      rosuvastatin (CRESTOR) 5 MG tablet Take one pill by mouth twice weekly 24 tablet 2   No current facility-administered medications on file prior to visit.    Allergies  Allergen Reactions   Augmentin [Amoxicillin-Pot Clavulanate]     GI upset    Sulfonamide Derivatives Rash    Past Medical History:  Diagnosis Date   Allergic rhinitis    Arthritis    hip   Borderline high cholesterol    Cataract    Cough variant asthma    Diverticulosis of colon    GERD (gastroesophageal reflux disease)    HTN (hypertension)    Hyperglycemia    borderline DM   OP (osteoporosis)    Seasonal allergies    Sleep apnea    no cpap    Past Surgical History:  Procedure Laterality Date   COLONOSCOPY     COLPOSCOPY     ESOPHAGOGASTRODUODENOSCOPY  10/01   Negative   ROTATOR CUFF REPAIR     right   TUBAL LIGATION      Family History  Problem Relation Age of Onset   Hypertension Father    Diabetes Father    Diabetes Brother    Diabetes Brother    Cancer Brother  prostate   Cancer Brother        prostate   Cancer Brother        prostate   Cancer Brother        prostate   Cancer Brother        prostate   Cancer Brother        prostate   Breast cancer Maternal Aunt    Colon cancer Neg Hx     Social History   Socioeconomic History   Marital status: Married    Spouse name: Not on file   Number of children: 1   Years of education: Not on file   Highest education level: Not on file  Occupational History   Occupation: Customer Service  Tobacco Use   Smoking status: Never   Smokeless tobacco: Never   Tobacco comments:    Remote 2nd hand exposure  Vaping Use   Vaping Use: Never used  Substance and Sexual Activity   Alcohol use: No    Alcohol/week: 0.0 standard drinks of alcohol   Drug use: No   Sexual activity: Yes  Other Topics Concern   Not  on file  Social History Narrative   Not on file   Social Determinants of Health   Financial Resource Strain: Low Risk  (08/02/2022)   Overall Financial Resource Strain (CARDIA)    Difficulty of Paying Living Expenses: Not hard at all  Food Insecurity: No Food Insecurity (08/02/2022)   Hunger Vital Sign    Worried About Running Out of Food in the Last Year: Never true    Ran Out of Food in the Last Year: Never true  Transportation Needs: No Transportation Needs (08/02/2022)   PRAPARE - Administrator, Civil Service (Medical): No    Lack of Transportation (Non-Medical): No  Physical Activity: Insufficiently Active (08/02/2022)   Exercise Vital Sign    Days of Exercise per Week: 4 days    Minutes of Exercise per Session: 30 min  Stress: No Stress Concern Present (08/02/2022)   [employer] of Occupational Health - Occupational Stress Questionnaire    Feeling of Stress : Not at all  Social Connections: Moderately Integrated (08/02/2022)   Social Connection and Isolation Panel [NHANES]    Frequency of Communication with Friends and Family: More than three times a week    Frequency of Social Gatherings with Friends and Family: More than three times a week    Attends Religious Services: More than 4 times per year    Active Member of [employer] or Organizations: No    Attends Banker Meetings: Never    Marital Status: Married  Catering manager Violence: Not At Risk (08/02/2022)   Humiliation, Afraid, Rape, and Kick questionnaire    Fear of Current or Ex-Partner: No    Emotionally Abused: No    Physically Abused: No    Sexually Abused: No   Review of Systems No fever Not feeling sick COVID infection about 3 weeks ago--better now     Objective:   Physical Exam Constitutional:      Appearance: Normal appearance.  Musculoskeletal:     Comments: Swelling in thenar eminence and wrist on left Reduced ROM and tender   Neurological:     Mental Status: She is  alert.            Assessment & Plan:

## 2022-08-03 NOTE — Assessment & Plan Note (Addendum)
Mechanism of injury suggests tendonitis--but has marked swelling and tenderness Will check x-ray  X-ray negative Probably just tendonitis Discussed ice/topical diclofenac/tylenol Can try the splint Ortho if doesn't improve (or sports medicine)

## 2022-08-11 ENCOUNTER — Other Ambulatory Visit: Payer: Self-pay | Admitting: Family Medicine

## 2022-08-12 ENCOUNTER — Other Ambulatory Visit: Payer: Self-pay | Admitting: *Deleted

## 2022-08-12 MED ORDER — FLUTICASONE PROPIONATE 50 MCG/ACT NA SUSP: 1.0000 | Freq: Every day | NASAL | 0 refills | Status: DC

## 2022-09-02 ENCOUNTER — Emergency Department (HOSPITAL_BASED_OUTPATIENT_CLINIC_OR_DEPARTMENT_OTHER)
Admission: EM | Admit: 2022-09-02 | Discharge: 2022-09-02 | Disposition: A | Discharge status: Home / Self Care | Payer: Medicare PPO | Attending: Emergency Medicine | Admitting: Emergency Medicine

## 2022-09-02 ENCOUNTER — Emergency Department (HOSPITAL_BASED_OUTPATIENT_CLINIC_OR_DEPARTMENT_OTHER): Payer: Medicare PPO

## 2022-09-02 ENCOUNTER — Emergency Department (HOSPITAL_BASED_OUTPATIENT_CLINIC_OR_DEPARTMENT_OTHER): Payer: Medicare PPO | Admitting: Radiology

## 2022-09-02 ENCOUNTER — Encounter (HOSPITAL_BASED_OUTPATIENT_CLINIC_OR_DEPARTMENT_OTHER): Payer: Self-pay

## 2022-09-02 ENCOUNTER — Other Ambulatory Visit: Payer: Self-pay

## 2022-09-02 DIAGNOSIS — S8002XA Contusion of left knee, initial encounter: Secondary | ICD-10-CM | POA: Insufficient documentation

## 2022-09-02 DIAGNOSIS — W010XXA Fall on same level from slipping, tripping and stumbling without subsequent striking against object, initial encounter: Secondary | ICD-10-CM | POA: Insufficient documentation

## 2022-09-02 DIAGNOSIS — Y9301 Activity, walking, marching and hiking: Secondary | ICD-10-CM | POA: Diagnosis not present

## 2022-09-02 DIAGNOSIS — K5790 Diverticulosis of intestine, part unspecified, without perforation or abscess without bleeding: Secondary | ICD-10-CM | POA: Diagnosis not present

## 2022-09-02 DIAGNOSIS — Z7984 Long term (current) use of oral hypoglycemic drugs: Secondary | ICD-10-CM | POA: Insufficient documentation

## 2022-09-02 DIAGNOSIS — S79912A Unspecified injury of left hip, initial encounter: Secondary | ICD-10-CM | POA: Diagnosis present

## 2022-09-02 DIAGNOSIS — Z79899 Other long term (current) drug therapy: Secondary | ICD-10-CM | POA: Diagnosis not present

## 2022-09-02 DIAGNOSIS — S7002XA Contusion of left hip, initial encounter: Secondary | ICD-10-CM | POA: Diagnosis not present

## 2022-09-02 MED ORDER — ACETAMINOPHEN 500 MG PO TABS
1000.0000 mg | ORAL_TABLET | Freq: Once | ORAL | Status: AC
  Administered 2022-09-02: 1000 mg via ORAL

## 2022-09-02 NOTE — Discharge Instructions (Addendum)
Use tylenol and ice every 4 hrs as needed for pain. See your doctor if no improvement next week.

## 2022-09-02 NOTE — ED Triage Notes (Signed)
Pt POV to ED c/o left hip and left knee pain following a fall. Pt states she was walking out of her doctor's office when she stumbled over her shoes, causing her to fall onto her left knee and then left hip. No blood thinners or LOC.

## 2022-09-02 NOTE — ED Provider Notes (Signed)
Jasmine Rollins   CSN: [id number] Arrival date & time: 09/02/22  7829     History  Chief Complaint  Patient presents with   Marletta Lor    Jasmine Rollins is a 72 y.o. female.  Patient presents with left knee and thigh pain since falling onto that side. She tripped, no head injury or syncope. No other injuries. Pain with walking.        Home Medications Prior to Admission medications   Medication Sig Start Date End Date Taking? Authorizing Provider  albuterol (PROVENTIL HFA) 108 (90 Base) MCG/ACT inhaler INHALE 2 PUFFS EVERY 4 HOURS AS NEEDED 08/05/20   Tower, Audrie Gallus, MD  alendronate (FOSAMAX) 70 MG tablet TAKE 1 TABLET EVERY 7 DAYS WITH A FULL GLASS OF WATER ON AN EMPTY STOMACH 08/18/21   Tower, Audrie Gallus, MD  amLODipine (NORVASC) 5 MG tablet TAKE 1 TABLET DAILY 04/15/22   Tower, Audrie Gallus, MD  Calcium Carb-Cholecalciferol 600-800 MG-UNIT TABS Take by mouth.    [provider]  fluticasone (FLONASE) 50 MCG/ACT nasal spray Place 1 spray into both nostrils daily. 08/12/22   Tower, Audrie Gallus, MD  fluticasone (FLOVENT HFA) 44 MCG/ACT inhaler USE 2 INHALATIONS TWICE A DAY (RINSE MOUTH AFTER USE) 08/05/20   Tower, Audrie Gallus, MD  glucose blood (FREESTYLE LITE) test strip To check glucose once daily (dx. R73.03) 09/23/20   Tower, Audrie Gallus, MD  latanoprost (XALATAN) 0.005 % ophthalmic solution Place 1 drop into both eyes at bedtime. 04/16/15   [provider]  metFORMIN (GLUCOPHAGE-XR) 500 MG 24 hr tablet TAKE 1 TABLET DAILY WITH BREAKFAST 06/27/22   Tower, Istachatta A, MD  montelukast (SINGULAIR) 10 MG tablet TAKE 1 TABLET AT BEDTIME 10/08/21   Tower, Audrie Gallus, MD  olmesartan-hydrochlorothiazide (BENICAR HCT) 40-12.5 MG tablet TAKE 1 TABLET DAILY 08/11/22   Tower, Audrie Gallus, MD  Omega-3 Fatty Acids (FISH OIL PO) Take 1 capsule by mouth daily.    [provider]  ondansetron (ZOFRAN-ODT) 4 MG disintegrating tablet Take 1 tablet (4 mg total) by  mouth every 8 (eight) hours as needed. 07/23/22   Jeanelle Malling, PA  ROCKLATAN 0.02-0.005 % SOLN  04/25/22   [provider]  rosuvastatin (CRESTOR) 5 MG tablet Take one pill by mouth twice weekly 06/23/22   Tower, Audrie Gallus, MD      Allergies    Augmentin [amoxicillin-pot clavulanate] and Sulfonamide derivatives    Review of Systems   Review of Systems  Constitutional:  Negative for chills and fever.  HENT:  Negative for congestion.   Eyes:  Negative for visual disturbance.  Respiratory:  Negative for shortness of breath.   Cardiovascular:  Negative for chest pain.  Gastrointestinal:  Negative for abdominal pain and vomiting.  Genitourinary:  Negative for dysuria and flank pain.  Musculoskeletal:  Positive for gait problem and joint swelling. Negative for back pain, neck pain and neck stiffness.  Skin:  Negative for rash.  Neurological:  Negative for light-headedness and headaches.    Physical Exam Updated Vital Signs BP (!) 147/84 (BP Location: Right Arm)   Pulse 65   Temp 98.5 F (36.9 C) (Oral)   Resp 17   Ht 5' 2.5" (1.588 m)   Wt 59 kg   SpO2 97%   BMI 23.40 kg/m  Physical Exam Vitals and nursing Rollins reviewed.  Constitutional:      General: She is not in acute distress.    Appearance: She is  well-developed.  HENT:     Head: Normocephalic and atraumatic.     Mouth/Throat:     Mouth: Mucous membranes are moist.  Eyes:     General:        Right eye: No discharge.        Left eye: No discharge.     Conjunctiva/sclera: Conjunctivae normal.  Neck:     Trachea: No tracheal deviation.  Cardiovascular:     Rate and Rhythm: Normal rate.  Pulmonary:     Effort: Pulmonary effort is normal.  Abdominal:     General: There is no distension.     Palpations: Abdomen is soft.  Musculoskeletal:        General: Swelling and tenderness present. No deformity.     Cervical back: Normal range of motion and neck supple. No rigidity.     Comments: Patient with mild tenderness  anterior patella and lateral left thigh and hip. No tibia, ankle or foot tenderness left side. Compartments soft. Pain with flexion left hip, minimal with flexion left knee.   Feet:     Comments: P Skin:    General: Skin is warm.     Capillary Refill: Capillary refill takes less than 2 seconds.     Findings: No rash.  Neurological:     General: No focal deficit present.     Mental Status: She is alert.  Psychiatric:        Mood and Affect: Mood normal.     ED Results / Procedures / Treatments   Labs (all labs ordered are listed, but only abnormal results are displayed) Labs Reviewed - No data to display  EKG None  Radiology CT Hip Left Wo Contrast  Result Date: 09/02/2022 CLINICAL DATA:  Left hip and left knee pain.  Fall. EXAM: CT OF THE LEFT HIP WITHOUT CONTRAST TECHNIQUE: Multidetector CT imaging of the left hip was performed according to the standard protocol. Multiplanar CT image reconstructions were also generated. RADIATION DOSE REDUCTION: This exam was performed according to the departmental dose-optimization program which includes automated exposure control, adjustment of the mA and/or kV according to patient size and/or use of iterative reconstruction technique. COMPARISON:  Radiographs 09/02/2022 FINDINGS: Bones/Joint/Cartilage Mild spurring of the left femoral head and acetabulum. No fracture or acute bony findings. Spurring and chondrocalcinosis of the pubis. No hip joint effusion. Ligaments Suboptimally assessed by CT. Muscles and Tendons Unremarkable Soft tissues Sigmoid colon diverticulosis. No impingement along the sacral plexus or proximal left sciatic nerve. IMPRESSION: 1. No acute bony findings. 2. Mild spurring of the left femoral head and acetabulum. 3. Spurring and chondrocalcinosis of the pubis. 4. Sigmoid colon diverticulosis. Electronically Signed   By: Gaylyn Rong M.D.   On: 09/02/2022 12:02   DG Hip Unilat With Pelvis 2-3 Views Left  Result Date:  09/02/2022 CLINICAL DATA:  Left hip pain EXAM: DG HIP (WITH OR WITHOUT PELVIS) 3V LEFT COMPARISON:  None Available. FINDINGS: No evidence of hip fracture or dislocation. Mild degenerative changes of the bilateral hips. Degenerative changes of the partially visualized lower lumbar spine. IMPRESSION: No radiographic evidence of left hip fracture or dislocation. Electronically Signed   By: Allegra Lai M.D.   On: 09/02/2022 10:29   DG Knee Complete 4 Views Left  Result Date: 09/02/2022 CLINICAL DATA:  L knee pain following fall EXAM: LEFT KNEE - COMPLETE 4+ VIEW COMPARISON:  None Available. FINDINGS: No acute fracture or dislocation. No aggressive osseous lesion. The knee joint appears within normal limits. No  significant arthritis. No knee effusion or focal soft tissue swelling. No radiopaque foreign bodies. IMPRESSION: 1. Negative. Electronically Signed   By: Jules Schick M.D.   On: 09/02/2022 10:29    Procedures Procedures    Medications Ordered in ED Medications  acetaminophen (TYLENOL) tablet 1,000 mg (1,000 mg Oral Given 09/02/22 1203)    ED Course/ Medical Decision Making/ A&P                             Medical Decision Making Amount and/or Complexity of Data Reviewed Radiology: ordered.  Risk OTC drugs.   Patient with left thigh contusion and hip / knee pain. Concern for contusion, other differential include occult fx. No evidence of dislocation on exam. Xrays independently reviewed, no acute fx however with persistent left hip pain CT ordered and tylenol for pain.  CT results reviewed independently, no fx. Pt stable for dc.        Final Clinical Impression(s) / ED Diagnoses Final diagnoses:  Contusion of left hip, initial encounter  Contusion of left patella, initial encounter  Diverticulosis    Rx / DC Orders ED Discharge Orders     None         Blane Ohara, MD 09/02/22 1209

## 2022-09-05 ENCOUNTER — Encounter: Payer: Self-pay | Admitting: Family Medicine

## 2022-09-05 ENCOUNTER — Ambulatory Visit (INDEPENDENT_AMBULATORY_CARE_PROVIDER_SITE_OTHER): Payer: Medicare PPO | Admitting: Family Medicine

## 2022-09-05 ENCOUNTER — Telehealth: Payer: Self-pay

## 2022-09-05 VITALS — BP 133/80 | HR 78 | Temp 98.2°F | Ht 62.5 in | Wt 126.5 lb

## 2022-09-05 DIAGNOSIS — W19XXXD Unspecified fall, subsequent encounter: Secondary | ICD-10-CM | POA: Diagnosis not present

## 2022-09-05 DIAGNOSIS — I10 Essential (primary) hypertension: Secondary | ICD-10-CM | POA: Diagnosis not present

## 2022-09-05 DIAGNOSIS — W19XXXA Unspecified fall, initial encounter: Secondary | ICD-10-CM | POA: Insufficient documentation

## 2022-09-05 DIAGNOSIS — R319 Hematuria, unspecified: Secondary | ICD-10-CM | POA: Insufficient documentation

## 2022-09-05 LAB — POCT UA - MICROSCOPIC ONLY
Bacteria, U Microscopic: 0
Casts, Ur, LPF, POC: 0
Crystals, Ur, HPF, POC: 0
RBC, Urine, Miroscopic: 0 (ref 0–2)
Yeast, UA: 0

## 2022-09-05 LAB — POC URINALSYSI DIPSTICK (AUTOMATED)
Bilirubin, UA: NEGATIVE
Blood, UA: 50 — AB
Glucose, UA: NEGATIVE
Ketones, UA: NEGATIVE
Leukocytes, UA: NEGATIVE
Nitrite, UA: NEGATIVE
Protein, UA: NEGATIVE
Spec Grav, UA: 1.01 (ref 1.010–1.025)
Urobilinogen, UA: 0.2 E.U./dL
pH, UA: 6 (ref 5.0–8.0)

## 2022-09-05 NOTE — Assessment & Plan Note (Signed)
bp in fair control at this time  BP Readings from Last 1 Encounters:  09/05/22 133/80   No changes needed Most recent labs reviewed  Disc lifstyle change with low sodium diet and exercise  Plan to continue Amlodipine 5 mg daily  benicar hct 40-12.5 mg daily   Encouraged to stop meloxicam now that she has improved

## 2022-09-05 NOTE — Progress Notes (Signed)
Subjective:    Patient ID: Jasmine Rollins, female    DOB: Dec 20, 1950, 72 y.o.   MRN: [id number]  HPI  Wt Readings from Last 3 Encounters:  09/05/22 126 lb 8 oz (57.4 kg)  09/02/22 130 lb (59 kg)  08/03/22 126 lb (57.2 kg)   22.77 kg/m  Vitals:   09/05/22 1458 09/05/22 1524  BP: (!) 144/78 133/80  Pulse: 78   Temp: 98.2 F (36.8 C)   SpO2: 97%    Pr presents for follow up of left knee and thigh pain after a fall Also hematuria (microscopic)-noted from an insurance physical (for life insurance)    Seen in ER on 7/19 Fell onto her left side   Had some new sketchers shoes - was walking out of a doctor office -she caught shoe on floor and fell on the left side   Bruised her left knee  A little abrasion   Mostly sore on lateral hip/trochanter and knee Getting better   Knee and hip films were negative   CT of hip  IMPRESSION: 1. No acute bony findings. 2. Mild spurring of the left femoral head and acetabulum. 3. Spurring and chondrocalcinosis of the pubis. 4. Sigmoid colon diverticulosis.   Urinalysis here Results for orders placed or performed in visit on 09/05/22  POCT Urinalysis Dipstick (Automated)  Result Value Ref Range   Color, UA Yellow    Clarity, UA Clear    Glucose, UA Negative Negative   Bilirubin, UA Negative    Ketones, UA Negative    Spec Grav, UA 1.010 1.010 - 1.025   Blood, UA 50 Ery/uL (A)    pH, UA 6.0 5.0 - 8.0   Protein, UA Negative Negative   Urobilinogen, UA 0.2 0.2 or 1.0 E.U./dL   Nitrite, UA Negative    Leukocytes, UA Negative Negative  POCT UA - Microscopic Only  Result Value Ref Range   WBC, Ur, HPF, POC 0-1 0 - 5   RBC, Urine, Miroscopic 0 0 - 2   Bacteria, U Microscopic 0 None - Trace   Mucus, UA few    Epithelial cells, urine per micros 0-1    Crystals, Ur, HPF, POC 0    Casts, Ur, LPF, POC 0    Yeast, UA 0    Positive for blood on dip and entirely negative micro No blood cells seen at all   No new frequency or  urgency    Bladder is sensitive to sugar  Eating less sugar  Drinks a good amount of water  Is exercising also- taking care of herself   Last A1c 6.1 (with insurance) - 2 weeks ago   On meloxicam for a hand problem Arthritis in her left hand from old fracture  Feels better now      Patient Active Problem List   Diagnosis Date Noted   Fall 09/05/2022   Hematuria 09/05/2022   Left wrist pain 08/03/2022   Post herpetic neuralgia 12/02/2020   Scalp pain 12/02/2020   Neck pain 11/20/2020   Insomnia 11/05/2020   Skin lesion 08/05/2020   Right knee pain 11/22/2019   Elevated TSH 12/14/2015   Vitamin D deficiency 12/06/2015   GERD (gastroesophageal reflux disease) 06/09/2014   Encounter for routine gynecological examination 03/03/2014   Estrogen deficiency 03/03/2014   Heartburn 03/01/2013   Gynecological examination 06/22/2010   Special screening for malignant neoplasms, colon 06/22/2010   Glaucoma suspect 06/22/2010   Routine general medical examination at a health care facility 06/03/2010  Sleep apnea    Osteoporosis 05/20/2009   SLEEP APNEA 10/17/2008   COUGH VARIANT ASTHMA 07/30/2008   Leukocytopenia 01/29/2008   Controlled type 2 diabetes mellitus without complication, without long-term current use of insulin (HCC) 11/27/2007   Hyperlipidemia associated with type 2 diabetes mellitus (HCC) 03/21/2007   Essential hypertension 03/21/2007   Sinusitis, chronic 03/21/2007   Allergic rhinitis 03/21/2007   DIVERTICULOSIS, COLON 03/21/2007   Past Medical History:  Diagnosis Date   Allergic rhinitis    Arthritis    hip   Borderline high cholesterol    Cataract    Cough variant asthma    Diverticulosis of colon    GERD (gastroesophageal reflux disease)    HTN (hypertension)    Hyperglycemia    borderline DM   OP (osteoporosis)    Seasonal allergies    Sleep apnea    no cpap   Past Surgical History:  Procedure Laterality Date   COLONOSCOPY     COLPOSCOPY      ESOPHAGOGASTRODUODENOSCOPY  10/01   Negative   ROTATOR CUFF REPAIR     right   TUBAL LIGATION     Social History   Tobacco Use   Smoking status: Never   Smokeless tobacco: Never   Tobacco comments:    Remote 2nd hand exposure  Vaping Use   Vaping status: Never Used  Substance Use Topics   Alcohol use: No    Alcohol/week: 0.0 standard drinks of alcohol   Drug use: No   Family History  Problem Relation Age of Onset   Hypertension Father    Diabetes Father    Diabetes Brother    Diabetes Brother    Cancer Brother        prostate   Cancer Brother        prostate   Cancer Brother        prostate   Cancer Brother        prostate   Cancer Brother        prostate   Cancer Brother        prostate   Breast cancer Maternal Aunt    Colon cancer Neg Hx    Allergies  Allergen Reactions   Augmentin [Amoxicillin-Pot Clavulanate]     GI upset    Sulfonamide Derivatives Rash   Current Outpatient Medications on File Prior to Visit  Medication Sig Dispense Refill   albuterol (PROVENTIL HFA) 108 (90 Base) MCG/ACT inhaler INHALE 2 PUFFS EVERY 4 HOURS AS NEEDED 18 g 3   alendronate (FOSAMAX) 70 MG tablet TAKE 1 TABLET EVERY 7 DAYS WITH A FULL GLASS OF WATER ON AN EMPTY STOMACH 12 tablet 3   amLODipine (NORVASC) 5 MG tablet TAKE 1 TABLET DAILY 90 tablet 1   Calcium Carb-Cholecalciferol 600-800 MG-UNIT TABS Take by mouth.     fluticasone (FLONASE) 50 MCG/ACT nasal spray Place 1 spray into both nostrils daily. 48 g 0   fluticasone (FLOVENT HFA) 44 MCG/ACT inhaler USE 2 INHALATIONS TWICE A DAY (RINSE MOUTH AFTER USE) 31.8 g 3   glucose blood (FREESTYLE LITE) test strip To check glucose once daily (dx. R73.03) 100 each 1   metFORMIN (GLUCOPHAGE-XR) 500 MG 24 hr tablet TAKE 1 TABLET DAILY WITH BREAKFAST 90 tablet 0   montelukast (SINGULAIR) 10 MG tablet TAKE 1 TABLET AT BEDTIME 90 tablet 3   olmesartan-hydrochlorothiazide (BENICAR HCT) 40-12.5 MG tablet TAKE 1 TABLET DAILY 90 tablet 1    Omega-3 Fatty Acids (FISH OIL PO) Take 1 capsule  by mouth daily.     ROCKLATAN 0.02-0.005 % SOLN      rosuvastatin (CRESTOR) 5 MG tablet Take one pill by mouth twice weekly 24 tablet 2   No current facility-administered medications on file prior to visit.    Review of Systems  Constitutional:  Negative for activity change, appetite change, fatigue, fever and unexpected weight change.  HENT:  Negative for congestion, ear pain, rhinorrhea, sinus pressure and sore throat.   Eyes:  Negative for pain, redness and visual disturbance.  Respiratory:  Negative for cough, shortness of breath and wheezing.   Cardiovascular:  Negative for chest pain and palpitations.  Gastrointestinal:  Negative for abdominal pain, blood in stool, constipation and diarrhea.  Endocrine: Negative for polydipsia and polyuria.  Genitourinary:  Negative for dysuria, frequency and urgency.  Musculoskeletal:  Positive for arthralgias. Negative for back pain and myalgias.  Skin:  Negative for pallor and rash.  Allergic/Immunologic: Negative for environmental allergies.  Neurological:  Negative for dizziness, syncope and headaches.  Hematological:  Negative for adenopathy. Does not bruise/bleed easily.  Psychiatric/Behavioral:  Negative for decreased concentration and dysphoric mood. The patient is not nervous/anxious.        Objective:   Physical Exam Constitutional:      General: She is not in acute distress.    Appearance: Normal appearance. She is well-developed and normal weight. She is not ill-appearing or diaphoretic.  HENT:     Head: Normocephalic and atraumatic.     Mouth/Throat:     Mouth: Mucous membranes are moist.  Eyes:     General: No scleral icterus.    Conjunctiva/sclera: Conjunctivae normal.     Pupils: Pupils are equal, round, and reactive to light.  Neck:     Thyroid: No thyromegaly.     Vascular: No carotid bruit or JVD.  Cardiovascular:     Rate and Rhythm: Normal rate and regular  rhythm.     Heart sounds: Normal heart sounds.     No gallop.  Pulmonary:     Effort: Pulmonary effort is normal. No respiratory distress.     Breath sounds: Normal breath sounds. No wheezing or rales.  Abdominal:     General: There is no distension or abdominal bruit.     Palpations: Abdomen is soft.  Musculoskeletal:     Cervical back: Normal range of motion and neck supple.     Right lower leg: No edema.     Left lower leg: No edema.     Comments: Ecchymosis of left patella with healed abrasion  Non tender Normal rom   Tender over left greater trochanter  Slightly swelling noted   Lymphadenopathy:     Cervical: No cervical adenopathy.  Skin:    General: Skin is warm and dry.     Coloration: Skin is not pale.     Findings: No rash.  Neurological:     Mental Status: She is alert.     Cranial Nerves: No cranial nerve deficit.     Motor: No weakness.     Coordination: Coordination normal.     Deep Tendon Reflexes: Reflexes are normal and symmetric. Reflexes normal.  Psychiatric:        Mood and Affect: Mood normal.           Assessment & Plan:   Problem List Items Addressed This Visit       Cardiovascular and Mediastinum   Essential hypertension    bp in fair control at this time  BP  Readings from Last 1 Encounters:  09/05/22 133/80   No changes needed Most recent labs reviewed  Disc lifstyle change with low sodium diet and exercise  Plan to continue Amlodipine 5 mg daily  benicar hct 40-12.5 mg daily   Encouraged to stop meloxicam now that she has improved         Other   Hematuria - Primary    Urine dip test positive for blood on insurance exam Also here Then micro is entirely clear (for rbc or wbc)   Will continue to watch this  Instructed to call if she develops any urinary changes       Relevant Orders   POCT Urinalysis Dipstick (Automated) (Completed)   POCT UA - Microscopic Only (Completed)   Fall    Seen in ER after trip and fall   Reviewed hospital records, lab results and studies in detail  Reassuring imaging Knee pain and hip pain are gradually improving Encouraged cold compresses Return to exercise (videos) when able  Discussed fall prevention

## 2022-09-05 NOTE — Patient Instructions (Addendum)
Take care yourself   Your urine microscopic exam is entirely negative for blood If you develop any urinary symptoms let us know If you see blood in urine let us know   This is reassuring   Use a cold compress on your knee and hip area  When better, start back to exercise     Stop meloxicam if you are done with it  It is hard on kidney/ can cause bleeding and stomach problems and elevated blood pressure    Take care of yourself

## 2022-09-05 NOTE — Transitions of Care (Post Inpatient/ED Visit) (Signed)
09/05/2022  Name: Jasmine Rollins MRN: [id number] DOB: 1950/08/18  Today's TOC FU Call Status: Today's TOC FU Call Status:: Successful TOC FU Call Competed TOC FU Call Complete Date: 09/05/22  Transition Care Management Follow-up Telephone Call Date of Discharge: 09/02/22 Discharge Facility: [facility] ([facility]) Type of Discharge: Emergency Department Reason for ED Visit: Other: (Contusion of left hip) How have you been since you were released from the hospital?: Better Any questions or concerns?: Yes Patient Questions/Concerns:: (S) insurance company told her that she had blood in her urine Patient Questions/Concerns Addressed: (S) Notified Provider of Patient Questions/Concerns  Items Reviewed: Did you receive and understand the discharge instructions provided?: Yes Medications obtained,verified, and reconciled?: Yes (Medications Reviewed) Any new allergies since your discharge?: No Dietary orders reviewed?: Yes Do you have support at home?: Yes  Medications Reviewed Today: Medications Reviewed Today     Reviewed by Merleen Nicely, LPN (Licensed Practical Nurse) on 09/05/22 at 1345  Med List Status: <None>   Medication Order Taking? Sig Documenting Provider Last Dose Status Informant  albuterol (PROVENTIL HFA) 108 (90 Base) MCG/ACT inhaler [id number] Yes INHALE 2 PUFFS EVERY 4 HOURS AS NEEDED Tower, Audrie Gallus, MD Taking Active   alendronate (FOSAMAX) 70 MG tablet [id number] Yes TAKE 1 TABLET EVERY 7 DAYS WITH A FULL GLASS OF WATER ON AN EMPTY STOMACH Tower, Audrie Gallus, MD Taking Active   amLODipine (NORVASC) 5 MG tablet [id number] Yes TAKE 1 TABLET DAILY Tower, Audrie Gallus, MD Taking Active   Calcium Carb-Cholecalciferol 600-800 MG-UNIT TABS [id number] Yes Take by mouth. [provider] Taking Active Self  fluticasone (FLONASE) 50 MCG/ACT nasal spray [id number] Yes Place 1 spray into both nostrils daily. Tower, Audrie Gallus, MD Taking Active   fluticasone (FLOVENT HFA) 44 MCG/ACT  inhaler [id number] Yes USE 2 INHALATIONS TWICE A DAY (RINSE MOUTH AFTER USE) Tower, Audrie Gallus, MD Taking Active   glucose blood (FREESTYLE LITE) test strip [id number] Yes To check glucose once daily (dx. R73.03) Tower, Audrie Gallus, MD Taking Active   latanoprost (XALATAN) 0.005 % ophthalmic solution [id number] Yes Place 1 drop into both eyes at bedtime. [provider] Taking Active Self           Med Note Montez Morita, CRYSTAL D   Wed Oct 21, 2015 12:24 AM)    metFORMIN (GLUCOPHAGE-XR) 500 MG 24 hr tablet [id number] Yes TAKE 1 TABLET DAILY WITH BREAKFAST Tower, Audrie Gallus, MD Taking Active   montelukast (SINGULAIR) 10 MG tablet [id number] Yes TAKE 1 TABLET AT BEDTIME Tower, Audrie Gallus, MD Taking Active   olmesartan-hydrochlorothiazide [facility] HCT) 40-12.5 MG tablet [id number] Yes TAKE 1 TABLET DAILY Tower, Audrie Gallus, MD Taking Active   Omega-3 Fatty Acids (FISH OIL PO) [id number] Yes Take 1 capsule by mouth daily. [provider] Taking Active   ondansetron (ZOFRAN-ODT) 4 MG disintegrating tablet [id number] Yes Take 1 tablet (4 mg total) by mouth every 8 (eight) hours as needed. Jeanelle Malling, PA Taking Active   ROCKLATAN 0.02-0.005 % Criss Rosales [id number] Yes  [provider] Taking Active   rosuvastatin (CRESTOR) 5 MG tablet [id number] Yes Take one pill by mouth twice weekly Tower, Audrie Gallus, MD Taking Active             Home Care and Equipment/Supplies: Were Home Health Services Ordered?: No Any new equipment or medical supplies ordered?: No  Functional Questionnaire: Do you need assistance with bathing/showering or dressing?: No Do you need assistance with meal preparation?: No Do you need assistance with eating?: No  Do you have difficulty maintaining continence: No Do you need assistance with getting out of bed/getting out of a chair/moving?: No Do you have difficulty managing or taking your medications?: No  Follow up appointments reviewed: PCP Follow-up appointment confirmed?: Yes Date of  PCP follow-up appointment?: 09/05/22 Follow-up Provider: [facility] Follow-up appointment confirmed?: No Do you need transportation to your follow-up appointment?: No    SIGNATURE  Woodfin Ganja LPN [facility] Nurse Health Advisor Direct Dial [phone]

## 2022-09-05 NOTE — Assessment & Plan Note (Signed)
Seen in ER after trip and fall  Reviewed hospital records, lab results and studies in detail  Reassuring imaging Knee pain and hip pain are gradually improving Encouraged cold compresses Return to exercise (videos) when able  Discussed fall prevention

## 2022-09-05 NOTE — Assessment & Plan Note (Signed)
Urine dip test positive for blood on insurance exam Also here Then micro is entirely clear (for rbc or wbc)   Will continue to watch this  Instructed to call if she develops any urinary changes

## 2022-09-12 ENCOUNTER — Other Ambulatory Visit: Payer: Self-pay | Admitting: Family Medicine

## 2022-09-20 ENCOUNTER — Encounter: Payer: Self-pay | Admitting: Family Medicine

## 2022-09-20 NOTE — Telephone Encounter (Signed)
If possible please send the urinalysis (last microscopic) to the company if we are allowed to do that - the micro had no blood  Schedule follow up in office to discuss her other concerns - too many to address outside of a visit  Thanks

## 2022-09-20 NOTE — Telephone Encounter (Signed)
Appt scheduled tomorrow

## 2022-09-21 ENCOUNTER — Ambulatory Visit: Payer: Medicare PPO | Admitting: Family Medicine

## 2022-09-21 ENCOUNTER — Encounter: Payer: Self-pay | Admitting: Family Medicine

## 2022-09-21 VITALS — BP 132/70 | HR 86 | Temp 97.7°F | Ht 62.5 in | Wt 123.1 lb

## 2022-09-21 DIAGNOSIS — R8290 Unspecified abnormal findings in urine: Secondary | ICD-10-CM | POA: Diagnosis not present

## 2022-09-21 DIAGNOSIS — E119 Type 2 diabetes mellitus without complications: Secondary | ICD-10-CM

## 2022-09-21 DIAGNOSIS — E785 Hyperlipidemia, unspecified: Secondary | ICD-10-CM

## 2022-09-21 DIAGNOSIS — R319 Hematuria, unspecified: Secondary | ICD-10-CM | POA: Diagnosis not present

## 2022-09-21 DIAGNOSIS — E1169 Type 2 diabetes mellitus with other specified complication: Secondary | ICD-10-CM | POA: Diagnosis not present

## 2022-09-21 DIAGNOSIS — Z8781 Personal history of (healed) traumatic fracture: Secondary | ICD-10-CM | POA: Insufficient documentation

## 2022-09-21 DIAGNOSIS — R7303 Prediabetes: Secondary | ICD-10-CM | POA: Insufficient documentation

## 2022-09-21 DIAGNOSIS — R748 Abnormal levels of other serum enzymes: Secondary | ICD-10-CM

## 2022-09-21 DIAGNOSIS — S62102S Fracture of unspecified carpal bone, left wrist, sequela: Secondary | ICD-10-CM

## 2022-09-21 DIAGNOSIS — Z7984 Long term (current) use of oral hypoglycemic drugs: Secondary | ICD-10-CM

## 2022-09-21 DIAGNOSIS — S62102A Fracture of unspecified carpal bone, left wrist, initial encounter for closed fracture: Secondary | ICD-10-CM | POA: Insufficient documentation

## 2022-09-21 DIAGNOSIS — M810 Age-related osteoporosis without current pathological fracture: Secondary | ICD-10-CM

## 2022-09-21 LAB — POC URINALSYSI DIPSTICK (AUTOMATED)
Bilirubin, UA: NEGATIVE
Blood, UA: 25 — AB
Glucose, UA: NEGATIVE
Ketones, UA: NEGATIVE
Nitrite, UA: NEGATIVE
Protein, UA: NEGATIVE
Spec Grav, UA: 1.025 (ref 1.010–1.025)
Urobilinogen, UA: 0.2 E.U./dL
pH, UA: 6 (ref 5.0–8.0)

## 2022-09-21 LAB — POCT UA - MICROSCOPIC ONLY

## 2022-09-21 LAB — HEPATIC FUNCTION PANEL
ALT: 25 U/L (ref 0–35)
AST: 19 U/L (ref 0–37)
Albumin: 4.7 g/dL (ref 3.5–5.2)
Alkaline Phosphatase: 89 U/L (ref 39–117)
Bilirubin, Direct: 0.1 mg/dL (ref 0.0–0.3)
Total Bilirubin: 0.6 mg/dL (ref 0.2–1.2)
Total Protein: 7.6 g/dL (ref 6.0–8.3)

## 2022-09-21 LAB — GAMMA GT: GGT: 70 U/L — ABNORMAL HIGH (ref 7–51)

## 2022-09-21 NOTE — Assessment & Plan Note (Signed)
Disc goals for lipids and reasons to control them Rev last labs with pt Rev low sat fat diet in detail Tolerating twice week crestor and will continue  Understands if she stops it cholesterol will go back up  Doing well with diet

## 2022-09-21 NOTE — Patient Instructions (Signed)
Let's re check urinalysis today   Take care of yourself   If you take tart cherry-take it with a lot of fluid   Continue the statin medicine if you tolerate it

## 2022-09-21 NOTE — Assessment & Plan Note (Signed)
Pt has had positive blood on dip and neg micro- suspect pseudohematuria  Has noted positive blood on dip for insurance exam -has caused problems Given pt copy of last micro (neg) at last visit   However today urinalysis shows wbc, bact and wbc Culture pending ? If uti or contaminated

## 2022-09-21 NOTE — Assessment & Plan Note (Signed)
With an insurance exam-was mildly elevated at 74 Pt does not drink etoh Normal iver labs in march  Will repeat these with GGT today  No GI symptoms

## 2022-09-21 NOTE — Assessment & Plan Note (Signed)
Taking alendronate and tolerating it  Recent wrist fracture was not a fragility injury

## 2022-09-21 NOTE — Assessment & Plan Note (Signed)
Lab Results  Component Value Date   HGBA1C 6.4 03/09/2022   With life insurance labs-down to 6.1 This is encouraging and pt commended disc imp of low glycemic diet and wt loss to prevent DM2

## 2022-09-21 NOTE — Assessment & Plan Note (Signed)
Per pt= fracture caught on [place] after a fall (initial xray neg) Medial wrist- unsure what bone  About a month  Still some swelling and soreness Encouraged use of cold compress Voltaren gel prn  Continue brace For follow up there soon and further plan

## 2022-09-21 NOTE — Progress Notes (Signed)
Subjective:    Patient ID: Jasmine Rollins, female    DOB: 08/07/1950, 72 y.o.   MRN: [id number]  HPI  Wt Readings from Last 3 Encounters:  09/21/22 123 lb 2 oz (55.8 kg)  09/05/22 126 lb 8 oz (57.4 kg)  09/02/22 130 lb (59 kg)   22.16 kg/m  Vitals:   09/21/22 1151  BP: 132/70  Pulse: 86  Temp: 97.7 F (36.5 C)  SpO2: 97%     Pt presents for blood in urine (dip only)- this was noted with life insurance work up  Also hand swelling with fracture  Concern about statin  Hyperlipidemia  Elevated GGT  Left wrist :  Had a fall and was initially concerned with hip and knee (had neg imaging incl CT) About a month ago    Then hand started swelling (left) Dr Althea Charon- did xray -ok  Did an ultrasound and found a small fracture     (may have had a fracture there in 1972)  In medial wrist  Very painful -- the brace really does help  Will re check in 3 weeks  Using ice  Voltaren gel    Thankful she is right handed    We re did urinalysis here last month Dip positive for blood and micro negative  Lab Results  Component Value Date   COLORU Yellow 09/21/2022   CLARITYU Clear 09/21/2022   GLUCOSEUR Negative 09/21/2022   BILIRUBINUR Negative 09/21/2022   KETONESU Negative 09/21/2022   SPECGRAV 1.025 09/21/2022   RBCUR 25 Ery/uL (A) 09/21/2022   PHUR 6.0 09/21/2022   PROTEINUR Negative 09/21/2022   UROBILINOGEN 0.2 09/21/2022   LEUKOCYTESUR Trace (A) 09/21/2022   Her micro was negative   No vaginal bleeding    Latest Reference Range & Units Most Recent  Bacteria, U Microscopic None - Trace  0 09/05/22 15:29  Casts, Ur, LPF, POC  0 09/05/22 15:29  Crystals, Ur, HPF, POC  0 09/05/22 15:29  Epithelial cells, urine per micros  0-1 09/05/22 15:29  Mucus, UA  few 09/05/22 15:29  RBC, urine, microscopic 0 - 2  0 09/05/22 15:29  WBC, Ur, HPF, POC 0 - 5  0-1 09/05/22 15:29  Yeast, UA  0 09/05/22 15:29   No frequency or urgency or burning or visible blood in urine    She takes tart cherry extract  She noted that occational made it hurt to urinate    Urinalysis today  Results for orders placed or performed in visit on 09/21/22  POCT Urinalysis Dipstick (Automated)  Result Value Ref Range   Color, UA Yellow    Clarity, UA Clear    Glucose, UA Negative Negative   Bilirubin, UA Negative    Ketones, UA Negative    Spec Grav, UA 1.025 1.010 - 1.025   Blood, UA 25 Ery/uL (A)    pH, UA 6.0 5.0 - 8.0   Protein, UA Negative Negative   Urobilinogen, UA 0.2 0.2 or 1.0 E.U./dL   Nitrite, UA Negative    Leukocytes, UA Trace (A) Negative  POCT UA - Microscopic Only  Result Value Ref Range   WBC, Ur, HPF, POC many 0 - 5   RBC, Urine, Miroscopic 3-10 0 - 2   Bacteria, U Microscopic many None - Trace   Mucus, UA few    Epithelial cells, urine per micros few    Crystals, Ur, HPF, POC few    Casts, Ur, LPF, POC none    Yeast, UA none  Hyperlipidemia Lab Results  Component Value Date   CHOL 152 04/19/2022   HDL 74.80 04/19/2022   LDLCALC 64 04/19/2022   LDLDIRECT 106.4 02/26/2013   TRIG 63.0 04/19/2022   CHOLHDL 2 04/19/2022   Crestor 5 mg twice weekly  Was having some cramps in the past  No longer having it      Lab Results  Component Value Date   HGBA1C 6.4 03/09/2022   This went down to 6.1 with insurance labs   GGT was flt elevated at 74  Does not drin etoh at all   Lab Results  Component Value Date   ALT 20 04/19/2022   AST 18 04/19/2022   ALKPHOS 90 03/09/2022   BILITOT 0.7 03/09/2022      Patient Active Problem List   Diagnosis Date Noted   Prediabetes 09/21/2022   Wrist fracture, left 09/21/2022   Elevated serum GGT level 09/21/2022   Abnormal urinalysis 09/21/2022   Fall 09/05/2022   Hematuria 09/05/2022   Left wrist pain 08/03/2022   Scalp pain 12/02/2020   Neck pain 11/20/2020   Insomnia 11/05/2020   Right knee pain 11/22/2019   Elevated TSH 12/14/2015   Vitamin D deficiency 12/06/2015   GERD  (gastroesophageal reflux disease) 06/09/2014   Encounter for routine gynecological examination 03/03/2014   Estrogen deficiency 03/03/2014   Heartburn 03/01/2013   Gynecological examination 06/22/2010   Special screening for malignant neoplasms, colon 06/22/2010   Glaucoma suspect 06/22/2010   Routine general medical examination at a health care facility 06/03/2010   Sleep apnea    Osteoporosis 05/20/2009   SLEEP APNEA 10/17/2008   COUGH VARIANT ASTHMA 07/30/2008   Leukocytopenia 01/29/2008   Controlled type 2 diabetes mellitus without complication, without long-term current use of insulin (HCC) 11/27/2007   Hyperlipidemia associated with type 2 diabetes mellitus (HCC) 03/21/2007   Essential hypertension 03/21/2007   Sinusitis, chronic 03/21/2007   Allergic rhinitis 03/21/2007   DIVERTICULOSIS, COLON 03/21/2007   Past Medical History:  Diagnosis Date   Allergic rhinitis    Arthritis    hip   Borderline high cholesterol    Cataract    Cough variant asthma    Diverticulosis of colon    GERD (gastroesophageal reflux disease)    HTN (hypertension)    Hyperglycemia    borderline DM   OP (osteoporosis)    Seasonal allergies    Sleep apnea    no cpap   Past Surgical History:  Procedure Laterality Date   COLONOSCOPY     COLPOSCOPY     ESOPHAGOGASTRODUODENOSCOPY  10/01   Negative   ROTATOR CUFF REPAIR     right   TUBAL LIGATION     Social History   Tobacco Use   Smoking status: Never   Smokeless tobacco: Never   Tobacco comments:    Remote 2nd hand exposure  Vaping Use   Vaping status: Never Used  Substance Use Topics   Alcohol use: No    Alcohol/week: 0.0 standard drinks of alcohol   Drug use: No   Family History  Problem Relation Age of Onset   Hypertension Father    Diabetes Father    Diabetes Brother    Diabetes Brother    Cancer Brother        prostate   Cancer Brother        prostate   Cancer Brother        prostate   Cancer Brother         prostate  Cancer Brother        prostate   Cancer Brother        prostate   Breast cancer Maternal Aunt    Colon cancer Neg Hx    Allergies  Allergen Reactions   Augmentin [Amoxicillin-Pot Clavulanate]     GI upset    Sulfonamide Derivatives Rash   Current Outpatient Medications on File Prior to Visit  Medication Sig Dispense Refill   albuterol (PROVENTIL HFA) 108 (90 Base) MCG/ACT inhaler INHALE 2 PUFFS EVERY 4 HOURS AS NEEDED 18 g 3   alendronate (FOSAMAX) 70 MG tablet TAKE 1 TABLET EVERY 7 DAYS WITH A FULL GLASS OF WATER ON AN EMPTY STOMACH 12 tablet 3   amLODipine (NORVASC) 5 MG tablet TAKE 1 TABLET DAILY 90 tablet 1   Calcium Carb-Cholecalciferol 600-800 MG-UNIT TABS Take by mouth.     fluticasone (FLONASE) 50 MCG/ACT nasal spray Place 1 spray into both nostrils daily. 48 g 0   fluticasone (FLOVENT HFA) 44 MCG/ACT inhaler USE 2 INHALATIONS TWICE A DAY (RINSE MOUTH AFTER USE) 31.8 g 3   glucose blood (FREESTYLE LITE) test strip To check glucose once daily (dx. R73.03) 100 each 1   metFORMIN (GLUCOPHAGE-XR) 500 MG 24 hr tablet TAKE 1 TABLET DAILY WITH BREAKFAST 90 tablet 0   montelukast (SINGULAIR) 10 MG tablet TAKE 1 TABLET AT BEDTIME 90 tablet 3   olmesartan-hydrochlorothiazide (BENICAR HCT) 40-12.5 MG tablet TAKE 1 TABLET DAILY 90 tablet 1   Omega-3 Fatty Acids (FISH OIL PO) Take 1 capsule by mouth daily.     ROCKLATAN 0.02-0.005 % SOLN      rosuvastatin (CRESTOR) 5 MG tablet Take one pill by mouth twice weekly 24 tablet 2   No current facility-administered medications on file prior to visit.    Review of Systems  Constitutional:  Negative for activity change, appetite change, fatigue, fever and unexpected weight change.  HENT:  Negative for congestion, ear pain, rhinorrhea, sinus pressure and sore throat.   Eyes:  Negative for pain, redness and visual disturbance.  Respiratory:  Negative for cough, shortness of breath and wheezing.   Cardiovascular:  Negative for chest  pain and palpitations.  Gastrointestinal:  Negative for abdominal pain, blood in stool, constipation and diarrhea.  Endocrine: Negative for polydipsia and polyuria.  Genitourinary:  Negative for dysuria, frequency and urgency.       Only has dysuria if she takes tart cherry product   Musculoskeletal:  Positive for arthralgias. Negative for back pain and myalgias.       Left wrist pain and swelling  Skin:  Negative for pallor and rash.  Allergic/Immunologic: Negative for environmental allergies.  Neurological:  Negative for dizziness, syncope and headaches.  Hematological:  Negative for adenopathy. Does not bruise/bleed easily.  Psychiatric/Behavioral:  Negative for decreased concentration and dysphoric mood. The patient is not nervous/anxious.        Objective:   Physical Exam Constitutional:      General: She is not in acute distress.    Appearance: Normal appearance. She is well-developed and normal weight. She is not ill-appearing or diaphoretic.  HENT:     Head: Normocephalic and atraumatic.  Eyes:     Conjunctiva/sclera: Conjunctivae normal.     Pupils: Pupils are equal, round, and reactive to light.  Neck:     Thyroid: No thyromegaly.     Vascular: No carotid bruit or JVD.  Cardiovascular:     Rate and Rhythm: Normal rate and regular rhythm.  Pulmonary:  Effort: Pulmonary effort is normal. No respiratory distress.  Abdominal:     General: There is no distension or abdominal bruit.     Palpations: Abdomen is soft.  Musculoskeletal:     Cervical back: Normal range of motion and neck supple.     Right lower leg: No edema.     Left lower leg: No edema.     Comments: Left wrist is swollen with reduced rom due to pain  Tender medially  No erythema   Lymphadenopathy:     Cervical: No cervical adenopathy.  Skin:    General: Skin is warm and dry.     Coloration: Skin is not pale.     Findings: No rash.  Neurological:     Mental Status: She is alert.     Coordination:  Coordination normal.     Deep Tendon Reflexes: Reflexes are normal and symmetric. Reflexes normal.  Psychiatric:        Mood and Affect: Mood normal.           Assessment & Plan:   Problem List Items Addressed This Visit       Endocrine   Hyperlipidemia associated with type 2 diabetes mellitus (HCC)    Disc goals for lipids and reasons to control them Rev last labs with pt Rev low sat fat diet in detail Tolerating twice week crestor and will continue  Understands if she stops it cholesterol will go back up  Doing well with diet       Controlled type 2 diabetes mellitus without complication, without long-term current use of insulin (HCC)    Lab Results  Component Value Date   HGBA1C 6.4 03/09/2022   With life insurance labs-down to 6.1 This is encouraging and pt commended disc imp of low glycemic diet and wt loss to prevent DM2         Musculoskeletal and Integument   Wrist fracture, left    Per pt= fracture caught on [place] after a fall (initial xray neg) Medial wrist- unsure what bone  About a month  Still some swelling and soreness Encouraged use of cold compress Voltaren gel prn  Continue brace For follow up there soon and further plan      Osteoporosis    Taking alendronate and tolerating it  Recent wrist fracture was not a fragility injury         Other   Prediabetes    Recent A1c 6.1 with insurance work up  Improved  disc imp of low glycemic diet and wt loss to prevent DM2       Hematuria    Pt has had positive blood on dip and neg micro- suspect pseudohematuria  Has noted positive blood on dip for insurance exam -has caused problems Given pt copy of last micro (neg) at last visit   However today urinalysis shows wbc, bact and wbc Culture pending ? If uti or contaminated       Relevant Orders   POCT Urinalysis Dipstick (Automated) (Completed)   Urine Culture   POCT UA - Microscopic Only (Completed)   Elevated serum GGT level    With an  insurance exam-was mildly elevated at 74 Pt does not drink etoh Normal iver labs in march  Will repeat these with GGT today  No GI symptoms       Relevant Orders   Gamma GT   Hepatic function panel   Abnormal urinalysis - Primary    Wbc, rbc, bact Culture pending  No  symptoms unless she takes tart cherry juice (acidic)      Relevant Orders   Urine Culture   POCT UA - Microscopic Only (Completed)

## 2022-09-21 NOTE — Assessment & Plan Note (Signed)
Wbc, rbc, bact Culture pending  No symptoms unless she takes tart cherry juice (acidic)

## 2022-09-21 NOTE — Assessment & Plan Note (Signed)
Recent A1c 6.1 with insurance work up  Improved  disc imp of low glycemic diet and wt loss to prevent DM2

## 2022-09-22 ENCOUNTER — Other Ambulatory Visit: Payer: Self-pay | Admitting: Family Medicine

## 2022-09-26 ENCOUNTER — Other Ambulatory Visit: Payer: Self-pay | Admitting: Family Medicine

## 2022-09-28 ENCOUNTER — Telehealth (INDEPENDENT_AMBULATORY_CARE_PROVIDER_SITE_OTHER): Payer: Medicare PPO | Admitting: Family Medicine

## 2022-09-28 ENCOUNTER — Other Ambulatory Visit: Payer: Medicare PPO

## 2022-09-28 DIAGNOSIS — R8290 Unspecified abnormal findings in urine: Secondary | ICD-10-CM

## 2022-09-28 DIAGNOSIS — R3129 Other microscopic hematuria: Secondary | ICD-10-CM

## 2022-09-28 LAB — POC URINALSYSI DIPSTICK (AUTOMATED)
Bilirubin, UA: NEGATIVE
Blood, UA: POSITIVE
Glucose, UA: NEGATIVE
Ketones, UA: NEGATIVE
Leukocytes, UA: NEGATIVE
Nitrite, UA: NEGATIVE
Protein, UA: NEGATIVE
Spec Grav, UA: 1.01 (ref 1.010–1.025)
Urobilinogen, UA: 0.2 E.U./dL
pH, UA: 6 (ref 5.0–8.0)

## 2022-09-28 NOTE — Telephone Encounter (Signed)
Repeat urinalysis.

## 2022-09-29 DIAGNOSIS — R3129 Other microscopic hematuria: Secondary | ICD-10-CM | POA: Insufficient documentation

## 2022-09-29 NOTE — Addendum Note (Signed)
Addended by: Roxy Manns A on: 09/29/2022 04:23 PM   Modules accepted: Orders

## 2022-10-03 ENCOUNTER — Encounter: Payer: Self-pay | Admitting: *Deleted

## 2022-10-22 ENCOUNTER — Other Ambulatory Visit: Payer: Self-pay

## 2022-10-22 ENCOUNTER — Emergency Department (HOSPITAL_BASED_OUTPATIENT_CLINIC_OR_DEPARTMENT_OTHER)
Admission: EM | Admit: 2022-10-22 | Discharge: 2022-10-23 | Disposition: A | Discharge status: Home / Self Care | Payer: Medicare PPO | Attending: Emergency Medicine | Admitting: Emergency Medicine

## 2022-10-22 DIAGNOSIS — K5730 Diverticulosis of large intestine without perforation or abscess without bleeding: Secondary | ICD-10-CM | POA: Insufficient documentation

## 2022-10-22 DIAGNOSIS — K921 Melena: Secondary | ICD-10-CM | POA: Insufficient documentation

## 2022-10-22 DIAGNOSIS — Z7951 Long term (current) use of inhaled steroids: Secondary | ICD-10-CM | POA: Diagnosis not present

## 2022-10-22 DIAGNOSIS — Z79899 Other long term (current) drug therapy: Secondary | ICD-10-CM | POA: Insufficient documentation

## 2022-10-22 DIAGNOSIS — E119 Type 2 diabetes mellitus without complications: Secondary | ICD-10-CM | POA: Diagnosis not present

## 2022-10-22 DIAGNOSIS — Z7984 Long term (current) use of oral hypoglycemic drugs: Secondary | ICD-10-CM | POA: Insufficient documentation

## 2022-10-22 DIAGNOSIS — J45909 Unspecified asthma, uncomplicated: Secondary | ICD-10-CM | POA: Insufficient documentation

## 2022-10-22 DIAGNOSIS — I10 Essential (primary) hypertension: Secondary | ICD-10-CM | POA: Insufficient documentation

## 2022-10-22 DIAGNOSIS — R1030 Lower abdominal pain, unspecified: Secondary | ICD-10-CM | POA: Diagnosis present

## 2022-10-22 DIAGNOSIS — K5790 Diverticulosis of intestine, part unspecified, without perforation or abscess without bleeding: Secondary | ICD-10-CM

## 2022-10-22 LAB — URINALYSIS, ROUTINE W REFLEX MICROSCOPIC
Bacteria, UA: NONE SEEN
Bilirubin Urine: NEGATIVE
Glucose, UA: NEGATIVE mg/dL
Ketones, ur: NEGATIVE mg/dL
Nitrite: NEGATIVE
Protein, ur: NEGATIVE mg/dL
Specific Gravity, Urine: 1.014 (ref 1.005–1.030)
pH: 6 (ref 5.0–8.0)

## 2022-10-22 LAB — CBC WITH DIFFERENTIAL/PLATELET
Abs Immature Granulocytes: 0.01 10*3/uL (ref 0.00–0.07)
Basophils Absolute: 0 10*3/uL (ref 0.0–0.1)
Basophils Relative: 1 %
Eosinophils Absolute: 0.1 10*3/uL (ref 0.0–0.5)
Eosinophils Relative: 3 %
HCT: 37.7 % (ref 36.0–46.0)
Hemoglobin: 12.5 g/dL (ref 12.0–15.0)
Immature Granulocytes: 0 %
Lymphocytes Relative: 48 %
Lymphs Abs: 1.9 10*3/uL (ref 0.7–4.0)
MCH: 30.9 pg (ref 26.0–34.0)
MCHC: 33.2 g/dL (ref 30.0–36.0)
MCV: 93.1 fL (ref 80.0–100.0)
Monocytes Absolute: 0.3 10*3/uL (ref 0.1–1.0)
Monocytes Relative: 8 %
Neutro Abs: 1.6 10*3/uL — ABNORMAL LOW (ref 1.7–7.7)
Neutrophils Relative %: 40 %
Platelets: 287 10*3/uL (ref 150–400)
RBC: 4.05 MIL/uL (ref 3.87–5.11)
RDW: 12.5 % (ref 11.5–15.5)
WBC: 4 10*3/uL (ref 4.0–10.5)
nRBC: 0 % (ref 0.0–0.2)

## 2022-10-22 NOTE — ED Triage Notes (Signed)
Pt to triage c/o of bright red blood in stool x 1 with abd pain 5/10 ache in nature x 24 hours. Pt denies blood thinners or previous bleeding issues. VSS NAD A/O x 4 Pt states she has been taking increased NSAIDs for pain relief of left wrist injury.

## 2022-10-22 NOTE — ED Provider Notes (Signed)
[place] EMERGENCY DEPARTMENT AT [facility] Provider Note  CSN: [id number] Arrival date & time: 10/22/22 2016  Chief Complaint(s) Blood In Stools  HPI Jasmine GROENKE is a 72 y.o. female with past medical history as below, significant for hypertension, hyperlipidemia, GERD, diverticulosis who presents to the ED with complaint of rectal bleeding  Patient reports around 10 AM this morning she had a bowel movement, thought it was going to be diarrhea but being part red blood.  She has not had recurrence of bleeding has had a "funny feeling" to her lower abdomen.  History of diverticulosis.  No vomiting.  No blood thinners.  No melena.  No bleeding between bowel movements.  No further bleeding from rectum.  Denies similar complaints in the past.  He has been taking NSAIDs recently due to left wrist injury.  No epigastric discomfort.  Denies vaginal bleeding or blood in urine  Past Medical History Past Medical History:  Diagnosis Date   Allergic rhinitis    Arthritis    hip   Borderline high cholesterol    Cataract    Cough variant asthma    Diverticulosis of colon    GERD (gastroesophageal reflux disease)    HTN (hypertension)    Hyperglycemia    borderline DM   OP (osteoporosis)    Seasonal allergies    Sleep apnea    no cpap   Patient Active Problem List   Diagnosis Date Noted   Microscopic hematuria 09/29/2022   Prediabetes 09/21/2022   Wrist fracture, left 09/21/2022   Elevated serum GGT level 09/21/2022   Abnormal urinalysis 09/21/2022   Fall 09/05/2022   Hematuria 09/05/2022   Left wrist pain 08/03/2022   Scalp pain 12/02/2020   Neck pain 11/20/2020   Insomnia 11/05/2020   Right knee pain 11/22/2019   Elevated TSH 12/14/2015   Vitamin D deficiency 12/06/2015   GERD (gastroesophageal reflux disease) 06/09/2014   Encounter for routine gynecological examination 03/03/2014   Estrogen deficiency 03/03/2014   Heartburn 03/01/2013   Gynecological examination  06/22/2010   Special screening for malignant neoplasms, colon 06/22/2010   Glaucoma suspect 06/22/2010   Routine general medical examination at a health care facility 06/03/2010   Sleep apnea    Osteoporosis 05/20/2009   SLEEP APNEA 10/17/2008   COUGH VARIANT ASTHMA 07/30/2008   Leukocytopenia 01/29/2008   Controlled type 2 diabetes mellitus without complication, without long-term current use of insulin (HCC) 11/27/2007   Hyperlipidemia associated with type 2 diabetes mellitus (HCC) 03/21/2007   Essential hypertension 03/21/2007   Sinusitis, chronic 03/21/2007   Allergic rhinitis 03/21/2007   DIVERTICULOSIS, COLON 03/21/2007   Home Medication(s) Prior to Admission medications   Medication Sig Start Date End Date Taking? Authorizing Provider  albuterol (PROVENTIL HFA) 108 (90 Base) MCG/ACT inhaler INHALE 2 PUFFS EVERY 4 HOURS AS NEEDED 08/05/20   Tower, Audrie Gallus, MD  alendronate (FOSAMAX) 70 MG tablet TAKE 1 TABLET EVERY 7 DAYS WITH A FULL GLASS OF WATER ON AN EMPTY STOMACH 09/13/22   Tower, Audrie Gallus, MD  amLODipine (NORVASC) 5 MG tablet TAKE 1 TABLET DAILY 09/23/22   Tower, Audrie Gallus, MD  Calcium Carb-Cholecalciferol 600-800 MG-UNIT TABS Take by mouth.    [provider]  fluticasone (FLONASE) 50 MCG/ACT nasal spray Place 1 spray into both nostrils daily. 08/12/22   Tower, Audrie Gallus, MD  fluticasone (FLOVENT HFA) 44 MCG/ACT inhaler USE 2 INHALATIONS TWICE A DAY (RINSE MOUTH AFTER USE) 08/05/20   Tower, Audrie Gallus, MD  glucose  blood (FREESTYLE LITE) test strip To check glucose once daily (dx. R73.03) 09/23/20   Tower, Audrie Gallus, MD  metFORMIN (GLUCOPHAGE-XR) 500 MG 24 hr tablet TAKE 1 TABLET DAILY WITH BREAKFAST 09/26/22   Tower, Audrie Gallus, MD  montelukast (SINGULAIR) 10 MG tablet TAKE 1 TABLET AT BEDTIME 09/23/22   Tower, Audrie Gallus, MD  olmesartan-hydrochlorothiazide (BENICAR HCT) 40-12.5 MG tablet TAKE 1 TABLET DAILY 08/11/22   Tower, Audrie Gallus, MD  Omega-3 Fatty Acids (FISH OIL PO) Take 1 capsule by  mouth daily.    [provider]  ROCKLATAN 0.02-0.005 % SOLN  04/25/22   [provider]  rosuvastatin (CRESTOR) 5 MG tablet Take one pill by mouth twice weekly 06/23/22   Tower, Audrie Gallus, MD                                                                                                                                    Past Surgical History Past Surgical History:  Procedure Laterality Date   COLONOSCOPY     COLPOSCOPY     ESOPHAGOGASTRODUODENOSCOPY  10/01   Negative   ROTATOR CUFF REPAIR     right   TUBAL LIGATION     Family History Family History  Problem Relation Age of Onset   Hypertension Father    Diabetes Father    Diabetes Brother    Diabetes Brother    Cancer Brother        prostate   Cancer Brother        prostate   Cancer Brother        prostate   Cancer Brother        prostate   Cancer Brother        prostate   Cancer Brother        prostate   Breast cancer Maternal Aunt    Colon cancer Neg Hx     Social History Social History   Tobacco Use   Smoking status: Never   Smokeless tobacco: Never   Tobacco comments:    Remote 2nd hand exposure  Vaping Use   Vaping status: Never Used  Substance Use Topics   Alcohol use: No    Alcohol/week: 0.0 standard drinks of alcohol   Drug use: No   Allergies Augmentin [amoxicillin-pot clavulanate] and Sulfonamide derivatives  Review of Systems Review of Systems  Constitutional:  Negative for chills and fever.  Respiratory:  Negative for chest tightness and shortness of breath.   Cardiovascular:  Negative for chest pain and palpitations.  Gastrointestinal:  Positive for abdominal pain and blood in stool. Negative for nausea and vomiting.  Genitourinary:  Negative for hematuria and vaginal bleeding.  All other systems reviewed and are negative.   Physical Exam Vital Signs  I have reviewed the triage vital signs BP 116/77   Pulse 70   Temp 98.2 F (36.8 C) (Oral)   Resp 18   Wt 52.2 kg  SpO2 97%   BMI 20.70 kg/m  Physical Exam Vitals and nursing note reviewed.  Constitutional:      General: She is not in acute distress.    Appearance: Normal appearance.  HENT:     Head: Normocephalic and atraumatic.     Right Ear: External ear normal.     Left Ear: External ear normal.     Nose: Nose normal.     Mouth/Throat:     Mouth: Mucous membranes are moist.  Eyes:     General: No scleral icterus.       Right eye: No discharge.        Left eye: No discharge.  Cardiovascular:     Rate and Rhythm: Normal rate and regular rhythm.     Pulses: Normal pulses.     Heart sounds: Normal heart sounds.  Pulmonary:     Effort: Pulmonary effort is normal. No respiratory distress.     Breath sounds: Normal breath sounds. No stridor.  Abdominal:     General: Abdomen is flat. There is no distension.     Palpations: Abdomen is soft.     Tenderness: There is no abdominal tenderness.  Musculoskeletal:     Cervical back: No rigidity.     Right lower leg: No edema.     Left lower leg: No edema.  Skin:    General: Skin is warm and dry.     Capillary Refill: Capillary refill takes less than 2 seconds.  Neurological:     Mental Status: She is alert.  Psychiatric:        Mood and Affect: Mood normal.        Behavior: Behavior normal. Behavior is cooperative.     ED Results and Treatments Labs (all labs ordered are listed, but only abnormal results are displayed) Labs Reviewed  CBC WITH DIFFERENTIAL/PLATELET - Abnormal; Notable for the following components:      Result Value   Neutro Abs 1.6 (*)    All other components within normal limits  COMPREHENSIVE METABOLIC PANEL - Abnormal; Notable for the following components:   Calcium 10.6 (*)    All other components within normal limits  LIPASE, BLOOD - Abnormal; Notable for the following components:   Lipase 98 (*)    All other components within normal limits  URINALYSIS, ROUTINE W REFLEX MICROSCOPIC - Abnormal; Notable for the  following components:   Hgb urine dipstick TRACE (*)    Leukocytes,Ua MODERATE (*)    All other components within normal limits  PROTIME-INR                                                                                                                          Radiology CT ABDOMEN PELVIS W CONTRAST  Result Date: 10/23/2022 CLINICAL DATA:  Acute abdominal pain EXAM: CT ABDOMEN AND PELVIS WITH CONTRAST TECHNIQUE: Multidetector CT imaging of the abdomen and pelvis was performed using the standard protocol following bolus administration of intravenous contrast. RADIATION  DOSE REDUCTION: This exam was performed according to the departmental dose-optimization program which includes automated exposure control, adjustment of the mA and/or kV according to patient size and/or use of iterative reconstruction technique. CONTRAST:  OMNIPAQUE IOHEXOL 300 MG/ML  SOLN COMPARISON:  None Available. FINDINGS: Lower chest: No acute abnormality. Hepatobiliary: There is an 11 mm cyst in the inferior right lobe of the liver. Gallbladder and bile ducts are within normal limits. Pancreas: Unremarkable. No pancreatic ductal dilatation or surrounding inflammatory changes. Spleen: Normal in size without focal abnormality. Adrenals/Urinary Tract: There is a 4.8 x 4.4 cm cyst in the left kidney. There are additional subcentimeter hypodensities in both kidneys which are favored as cysts. There is no hydronephrosis or perinephric fat stranding. The adrenal glands and bladder are within normal limits. Stomach/Bowel: Stomach is within normal limits. No evidence of bowel wall thickening, distention, or inflammatory changes. There is sigmoid colon diverticulosis. The appendix is not visualized. Vascular/Lymphatic: No significant vascular findings are present. No enlarged abdominal or pelvic lymph nodes. Reproductive: The uterus is prominent in size likely related to fibroid change. Other: There is a small fat containing umbilical  hernia. There is no ascites. Musculoskeletal: No acute or significant osseous findings. IMPRESSION: 1. No acute localizing process in the abdomen or pelvis. 2. Sigmoid colon diverticulosis without evidence for diverticulitis. 3. Fibroid uterus. 4. Renal cysts.  No follow-up imaging recommended. Electronically Signed   By: Darliss Cheney M.D.   On: 10/23/2022 02:19    Pertinent labs & imaging results that were available during my care of the patient were reviewed by me and considered in my medical decision making (see MDM for details).  Medications Ordered in ED Medications  iohexol (OMNIPAQUE) 300 MG/ML solution 100 mL (100 mLs Intravenous Contrast Given 10/23/22 0045)                                                                                                                                     Procedures Procedures  (including critical care time)  Medical Decision Making / ED Course    Medical Decision Making:    CORBY YANDYKE is a 72 y.o. female with past medical history as below, significant for hypertension, hyperlipidemia, GERD, diverticulosis who presents to the ED with complaint of rectal bleeding. The complaint involves an extensive differential diagnosis and also carries with it a high risk of complications and morbidity.  Serious etiology was considered. Ddx includes but is not limited to: Differential diagnosis includes but is not exclusive to ectopic pregnancy, ovarian cyst, ovarian torsion, acute appendicitis, urinary tract infection, endometriosis, bowel obstruction, hernia, colitis, renal colic, gastroenteritis, volvulus etc.   Complete initial physical exam performed, notably the patient  was no acute distress, sitting upright, no dyspnea.    Reviewed and confirmed nursing documentation for past medical history, family history, social history.  Vital signs reviewed.    Clinical Course as of 10/23/22 0346  Wynelle Link Oct 23, 2022  0048 Hemoglobin: 12.5 Similar to prior [SG]   0312 CT with diverticulosis, no ongoing bleeding reported by pt. Hgb stable, HDS [SG]    Clinical Course User Index [SG] Sloan Leiter, DO     Labs reviewed, these are stable.  She has mildly elevated lipase, no epigastric discomfort, no alcohol abuse.  No gallstones on imaging.  No evidence of pancreatitis on imaging my disposition for pancreatitis is fairly low at this point.  Imaging is revealing of diverticulosis without diverticulitis.  The bleeding has resolved spontaneously.  She has had normal bowel movement since the initial bowel movement with blood earlier today.  Her labs are stable.  She reports history of prior diverticulosis.  She follows with gastroenterology and has colonoscopy scheduled for next year.  Advised her to follow more closely in the next few weeks to gastroenterology for recheck.   Bleeding has subsided, labs stable, imaging stable, feeling better.  No thinners, no ongoing bleeding.  Stable for discharge and outpatient management  The patient improved significantly and was discharged in stable condition. Detailed discussions were had with the patient regarding current findings, and need for close f/u with PCP or on call doctor. The patient has been instructed to return immediately if the symptoms worsen in any way for re-evaluation. Patient verbalized understanding and is in agreement with current care plan. All questions answered prior to discharge.                   Additional history obtained: -Additional history obtained from spouse -External records from outside source obtained and reviewed including: Chart review including previous notes, labs, imaging, consultation notes including  Prior ED visits, prior labs and imaging, home medications   Lab Tests: -I ordered, reviewed, and interpreted labs.   The pertinent results include:   Labs Reviewed  CBC WITH DIFFERENTIAL/PLATELET - Abnormal; Notable for the following components:      Result  Value   Neutro Abs 1.6 (*)    All other components within normal limits  COMPREHENSIVE METABOLIC PANEL - Abnormal; Notable for the following components:   Calcium 10.6 (*)    All other components within normal limits  LIPASE, BLOOD - Abnormal; Notable for the following components:   Lipase 98 (*)    All other components within normal limits  URINALYSIS, ROUTINE W REFLEX MICROSCOPIC - Abnormal; Notable for the following components:   Hgb urine dipstick TRACE (*)    Leukocytes,Ua MODERATE (*)    All other components within normal limits  PROTIME-INR    Notable for lipase mild +  EKG   EKG Interpretation Date/Time:    Ventricular Rate:    PR Interval:    QRS Duration:    QT Interval:    QTC Calculation:   R Axis:      Text Interpretation:           Imaging Studies ordered: I ordered imaging studies including CTAP I independently visualized the following imaging with scope of interpretation limited to determining acute life threatening conditions related to emergency care; findings noted above, significant for diverticulosis  I independently visualized and interpreted imaging. I agree with the radiologist interpretation   Medicines ordered and prescription drug management: Meds ordered this encounter  Medications   iohexol (OMNIPAQUE) 300 MG/ML solution 100 mL    -I have reviewed the patients home medicines and have made adjustments as needed   Consultations Obtained: na   Cardiac Monitoring: Continuous pulse oximetry interpreted  by myself, 99% on RA.    Social Determinants of Health:  Diagnosis or treatment significantly limited by social determinants of health: non smoker    Reevaluation: After the interventions noted above, I reevaluated the patient and found that they have improved  Co morbidities that complicate the patient evaluation  Past Medical History:  Diagnosis Date   Allergic rhinitis    Arthritis    hip   Borderline high cholesterol     Cataract    Cough variant asthma    Diverticulosis of colon    GERD (gastroesophageal reflux disease)    HTN (hypertension)    Hyperglycemia    borderline DM   OP (osteoporosis)    Seasonal allergies    Sleep apnea    no cpap      Dispostion: Disposition decision including need for hospitalization was considered, and patient discharged from emergency department.    Final Clinical Impression(s) / ED Diagnoses Final diagnoses:  Diverticulosis  Blood in stool        Sloan Leiter, DO 10/23/22 [phone]

## 2022-10-23 ENCOUNTER — Emergency Department (HOSPITAL_BASED_OUTPATIENT_CLINIC_OR_DEPARTMENT_OTHER): Payer: Medicare PPO

## 2022-10-23 DIAGNOSIS — K5730 Diverticulosis of large intestine without perforation or abscess without bleeding: Secondary | ICD-10-CM | POA: Diagnosis not present

## 2022-10-23 DIAGNOSIS — K429 Umbilical hernia without obstruction or gangrene: Secondary | ICD-10-CM | POA: Diagnosis not present

## 2022-10-23 DIAGNOSIS — D259 Leiomyoma of uterus, unspecified: Secondary | ICD-10-CM | POA: Diagnosis not present

## 2022-10-23 DIAGNOSIS — N281 Cyst of kidney, acquired: Secondary | ICD-10-CM | POA: Diagnosis not present

## 2022-10-23 LAB — COMPREHENSIVE METABOLIC PANEL
ALT: 14 U/L (ref 0–44)
AST: 15 U/L (ref 15–41)
Albumin: 4.8 g/dL (ref 3.5–5.0)
Alkaline Phosphatase: 79 U/L (ref 38–126)
Anion gap: 8 (ref 5–15)
BUN: 13 mg/dL (ref 8–23)
CO2: 31 mmol/L (ref 22–32)
Calcium: 10.6 mg/dL — ABNORMAL HIGH (ref 8.9–10.3)
Chloride: 98 mmol/L (ref 98–111)
Creatinine, Ser: 0.65 mg/dL (ref 0.44–1.00)
GFR, Estimated: >60 mL/min (ref >60)
Glucose, Bld: 93 mg/dL (ref 70–99)
Potassium: 3.7 mmol/L (ref 3.5–5.1)
Sodium: 137 mmol/L (ref 135–145)
Total Bilirubin: 0.5 mg/dL (ref 0.3–1.2)
Total Protein: 8 g/dL (ref 6.5–8.1)

## 2022-10-23 LAB — PROTIME-INR
INR: 0.9 (ref 0.8–1.2)
Prothrombin Time: 12.7 s (ref 11.4–15.2)

## 2022-10-23 LAB — LIPASE, BLOOD: Lipase: 98 U/L — ABNORMAL HIGH (ref 11–51)

## 2022-10-23 MED ORDER — IOHEXOL 300 MG/ML  SOLN
100.0000 mL | Freq: Once | INTRAMUSCULAR | Status: AC | PRN
  Administered 2022-10-23: 100 mL via INTRAVENOUS

## 2022-10-23 NOTE — Discharge Instructions (Signed)
It was a pleasure caring for you today in the emergency department.  Please return to the emergency department for any worsening or worrisome symptoms.  Please return if bleeding reoccurs does not resolve.  Return if you have severe abdominal pain, vomiting, any worsening worrisome symptoms.  Otherwise please follow-up with gastroenterology

## 2022-10-24 ENCOUNTER — Telehealth: Payer: Self-pay

## 2022-10-24 NOTE — Telephone Encounter (Signed)
ER on 10/22/22 for blood in stools. Received message from Dr Meridee Score asking for telephone follow up with the patient. Called the patient. She reports 3 bowel movements since Saturday 10/22/22. No blood in any of these stools. Feels well. No abdominal pain. Appointment scheduled for 10/31/22 for follow up at 9:00 am with Doug Sou, PA.

## 2022-10-31 ENCOUNTER — Encounter: Payer: Self-pay | Admitting: Gastroenterology

## 2022-10-31 ENCOUNTER — Other Ambulatory Visit (INDEPENDENT_AMBULATORY_CARE_PROVIDER_SITE_OTHER): Payer: Medicare PPO

## 2022-10-31 ENCOUNTER — Ambulatory Visit (INDEPENDENT_AMBULATORY_CARE_PROVIDER_SITE_OTHER): Payer: Medicare PPO | Admitting: Gastroenterology

## 2022-10-31 VITALS — BP 132/80 | HR 76 | Ht 62.5 in | Wt 125.0 lb

## 2022-10-31 DIAGNOSIS — K625 Hemorrhage of anus and rectum: Secondary | ICD-10-CM

## 2022-10-31 DIAGNOSIS — R10816 Epigastric abdominal tenderness: Secondary | ICD-10-CM | POA: Diagnosis not present

## 2022-10-31 DIAGNOSIS — R748 Abnormal levels of other serum enzymes: Secondary | ICD-10-CM | POA: Diagnosis not present

## 2022-10-31 LAB — LIPASE: Lipase: 73 U/L — ABNORMAL HIGH (ref 11.0–59.0)

## 2022-10-31 MED ORDER — PLENVU 140 G PO SOLR: 1.0000 | Freq: Once | ORAL | 0 refills | Status: AC

## 2022-10-31 NOTE — Patient Instructions (Signed)
Your provider has requested that you go to the basement level for lab work before leaving today. Press "B" on the elevator. The lab is located at the first door on the left as you exit the elevator.  You have been scheduled for a colonoscopy. Please follow written instructions given to you at your visit today.   Please pick up your prep supplies at the pharmacy within the next 1-3 days.  If you use inhalers (even only as needed), please bring them with you on the day of your procedure.  DO NOT TAKE 7 DAYS PRIOR TO TEST- Trulicity (dulaglutide) Ozempic, Wegovy (semaglutide) Mounjaro (tirzepatide) Bydureon Bcise (exanatide extended release)  DO NOT TAKE 1 DAY PRIOR TO YOUR TEST Rybelsus (semaglutide) Adlyxin (lixisenatide) Victoza (liraglutide) Byetta (exanatide) __________________________________________________________  If your blood pressure at your visit was 140/90 or greater, please contact your primary care physician to follow up on this.  _______________________________________________________  If you are age 8 or older, your body mass index should be between 23-30. Your Body mass index is 22.5 kg/m. If this is out of the aforementioned range listed, please consider follow up with your Primary Care Provider.  If you are age 6 or younger, your body mass index should be between 19-25. Your Body mass index is 22.5 kg/m. If this is out of the aformentioned range listed, please consider follow up with your Primary Care Provider.   ________________________________________________________  The [facility] providers would like to encourage you to use [facility] to communicate with providers for non-urgent requests or questions.  Due to long hold times on the telephone, sending your provider a message by [facility] may be a faster and more efficient way to get a response.  Please allow 48 business hours for a response.  Please remember that this is for non-urgent requests.   _______________________________________________________

## 2022-10-31 NOTE — Progress Notes (Signed)
10/31/2022 Jasmine Rollins [id number] [birth year]   HISTORY OF PRESENT ILLNESS: This is a pleasant 72 year old female who is a patient of Dr. Elana Alm.  She has past medical history of hypertension, borderline diabetes mellitus, osteoporosis, hypercholesterolemia.  Is here today with complaints of rectal bleeding.  She says that about 2 to 2.5 weeks ago she had an episode of a good amount of bright red rectal bleeding.  This was in the toilet bowl and on the toilet paper.  It resolved and she has not seen any further bleeding since then.  She says her bowel movements have been normal.  She moves her bowels fairly regularly.  She says that every once in a while she will have some constipation, but she did not notice any constipation or straining around the time that she saw the blood.  She does not have any abdominal pain.  She did go to the ED because of the rectal bleeding and hemoglobin was normal, but down slightly from her baseline.  Of note, they checked a lipase level and it was elevated at 98.  CT scan of the abdomen and pelvis with contrast that showed sigmoid diverticulosis without evidence of diverticulitis, no other cause of her bleeding noted.  CT scan showed a normal pancreas.  She denies any abdominal pain.  Her last colonoscopy was in April 2017 at which time she had small nonbleeding internal hemorrhoids and multiple diverticula with some associated spasm and narrowing.   Past Medical History:  Diagnosis Date   Allergic rhinitis    Arthritis    hip   Borderline high cholesterol    Cataract    Cough variant asthma    Diverticulosis of colon    GERD (gastroesophageal reflux disease)    HTN (hypertension)    Hyperglycemia    borderline DM   OP (osteoporosis)    Seasonal allergies    Sleep apnea    no cpap   Past Surgical History:  Procedure Laterality Date   COLONOSCOPY     COLPOSCOPY     ESOPHAGOGASTRODUODENOSCOPY  10/01   Negative   ROTATOR CUFF REPAIR     right    TUBAL LIGATION      reports that she has never smoked. She has never used smokeless tobacco. She reports that she does not drink alcohol and does not use drugs. family history includes Breast cancer in her maternal aunt; Cancer in her brother, brother, brother, brother, brother, and brother; Diabetes in her brother, brother, and father; Hypertension in her father. Allergies  Allergen Reactions   Augmentin [Amoxicillin-Pot Clavulanate]     GI upset    Sulfonamide Derivatives Rash      Outpatient Encounter Medications as of 10/31/2022  Medication Sig   albuterol (PROVENTIL HFA) 108 (90 Base) MCG/ACT inhaler INHALE 2 PUFFS EVERY 4 HOURS AS NEEDED   alendronate (FOSAMAX) 70 MG tablet TAKE 1 TABLET EVERY 7 DAYS WITH A FULL GLASS OF WATER ON AN EMPTY STOMACH   amLODipine (NORVASC) 5 MG tablet TAKE 1 TABLET DAILY   Calcium Carb-Cholecalciferol 600-800 MG-UNIT TABS Take by mouth.   fluticasone (FLONASE) 50 MCG/ACT nasal spray Place 1 spray into both nostrils daily.   fluticasone (FLOVENT HFA) 44 MCG/ACT inhaler USE 2 INHALATIONS TWICE A DAY (RINSE MOUTH AFTER USE)   glucose blood (FREESTYLE LITE) test strip To check glucose once daily (dx. R73.03)   metFORMIN (GLUCOPHAGE-XR) 500 MG 24 hr tablet TAKE 1 TABLET DAILY WITH BREAKFAST   montelukast (SINGULAIR)  10 MG tablet TAKE 1 TABLET AT BEDTIME   olmesartan-hydrochlorothiazide (BENICAR HCT) 40-12.5 MG tablet TAKE 1 TABLET DAILY   Omega-3 Fatty Acids (FISH OIL PO) Take 1 capsule by mouth daily.   ROCKLATAN 0.02-0.005 % SOLN    rosuvastatin (CRESTOR) 5 MG tablet Take one pill by mouth twice weekly   No facility-administered encounter medications on file as of 10/31/2022.    REVIEW OF SYSTEMS  : All other systems reviewed and negative except where noted in the History of Present Illness.   PHYSICAL EXAM: BP 132/80   Pulse 76   Ht 5' 2.5" (1.588 m)   Wt 125 lb (56.7 kg)   BMI 22.50 kg/m  General: Well developed female in no acute  distress Head: Normocephalic and atraumatic Eyes:  Sclerae anicteric, conjunctiva pink. Ears: Normal auditory acuity Lungs: Clear throughout to auscultation; no W/R/R. Heart: Regular rate and rhythm; no M/R/G. Abdomen: Soft, non-distended.  BS present.  Minimal epigastric TTP. Rectal:  Will be done at the time of colonoscopy. Musculoskeletal: Symmetrical with no gross deformities  Skin: No lesions on visible extremities Extremities: No edema  Neurological: Alert oriented x 4, grossly non-focal Psychological:  Alert and cooperative. Normal mood and affect  ASSESSMENT AND PLAN: *Rectal bleeding: Had a fairly large episode of red rectal bleeding in the toilet bowl and on the toilet paper 2 to 2-1/2 weeks ago.  Resolved and has not had any more since then.  Last colonoscopy 7.5 years ago.  Suspect could have been hemorrhoidal versus even a transient diverticular bleed, but will schedule with Dr. Lavon Paganini.  The risks, benefits, and alternatives to colonoscopy were discussed with the patient and she consents to proceed.  *Elevated lipase: Had gone to the ED when she had the episode of rectal bleeding.  Lipase was elevated at 98, but CT scan showed a normal pancreas.  Will repeat lipase today.  Was not having any epigastric abdominal pain complaints, but was a little tender in the epigastrium on exam today.  CC:  Tower, Audrie Gallus, MD

## 2022-11-02 ENCOUNTER — Encounter: Payer: Self-pay | Admitting: Gastroenterology

## 2022-11-02 DIAGNOSIS — M25532 Pain in left wrist: Secondary | ICD-10-CM | POA: Diagnosis not present

## 2022-11-02 DIAGNOSIS — M19032 Primary osteoarthritis, left wrist: Secondary | ICD-10-CM | POA: Diagnosis not present

## 2022-11-09 ENCOUNTER — Telehealth: Payer: Self-pay

## 2022-11-09 DIAGNOSIS — M25532 Pain in left wrist: Secondary | ICD-10-CM | POA: Diagnosis not present

## 2022-11-09 DIAGNOSIS — H04123 Dry eye syndrome of bilateral lacrimal glands: Secondary | ICD-10-CM | POA: Diagnosis not present

## 2022-11-09 DIAGNOSIS — H02881 Meibomian gland dysfunction right upper eyelid: Secondary | ICD-10-CM | POA: Diagnosis not present

## 2022-11-09 NOTE — Telephone Encounter (Signed)
The pt is not comfortable with having MRI at this time. She states she will speak with Dr Lavon Paganini at her colon on 10/1 after she has time to think about it.

## 2022-11-09 NOTE — Telephone Encounter (Signed)
-----   Message from [place] D. Zehr sent at 11/09/2022 11:02 AM EDT ----- Please let the patient know that her lipase was still somewhat elevated.  I discussed with Dr. Lavon Paganini about getting an MRI of the pancreas just to be sure no underlying lesions are seen that would be causing this and she agrees.  If patient is in agreement please schedule MRI of the pancreas. ----- Message ----- From: Napoleon Form, MD Sent: 11/08/2022   4:33 PM EDT To: Princella Pellegrini Zehr, PA-C  It is unusual to have that much elevation in lipase with normal kidney function.  We do not have any other lipase level to compare, some do have mild elevation in lipase chronically Her GGT was also mildly elevated, I think it is worth getting to make sure she does not have any pancreatic lesion. Thank you ----- Message ----- From: Leta Baptist, PA-C Sent: 10/31/2022   5:09 PM EDT To: Napoleon Form, MD  I saw this lady in clinic today for rectal bleeding and scheduled for colonoscopy.  She went to the ER with the rectal bleeding and they checked a lipase level that was elevated at 98.  CT scan showed a normal pancreas.  She tells me that she did not have any abdominal pain and is not having any abdominal pain now, was just very minimally tender in the epigastrium on exam.  I repeated lipase and it still slightly elevated in the 70s today.  Do you think with her age that I should pursue further imaging just to be sure this is nothing else? ----- Message ----- From: Interface, [facility] Sent: 10/31/2022  11:28 AM EDT To: Leta Baptist, PA-C

## 2022-11-09 NOTE — Telephone Encounter (Signed)
Left message on machine to call back  

## 2022-11-10 DIAGNOSIS — R3121 Asymptomatic microscopic hematuria: Secondary | ICD-10-CM | POA: Diagnosis not present

## 2022-11-13 IMAGING — CT CT HEAD W/O CM
3 of 4 series · 15 of 47 positions shown, 18 images · non-contrast
Comparison: No pertinent prior exams available for comparison.

CLINICAL DATA: New daily persistent headache. Headache,
intracranial hemorrhage suspected. Additional history provided by
scanning technologist: Patient reports headaches for 1 month. Left
neck pain.

EXAM:
CT HEAD WITHOUT CONTRAST
TECHNIQUE: Contiguous axial images were obtained from the base of the skull
through the vertex without intravenous contrast.

[Series 3: head 2.0 hr68 · axial · 0.41mm/px · z∈[+149,+255]mm · 9 of 67 slices shown, 12 images]
[im 7/67  brain]
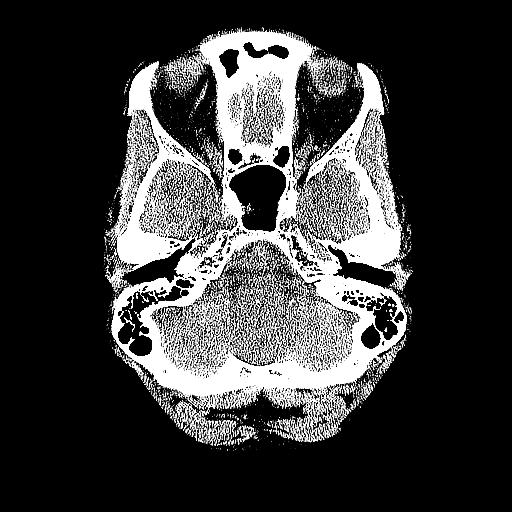
[im 7/67  bone]
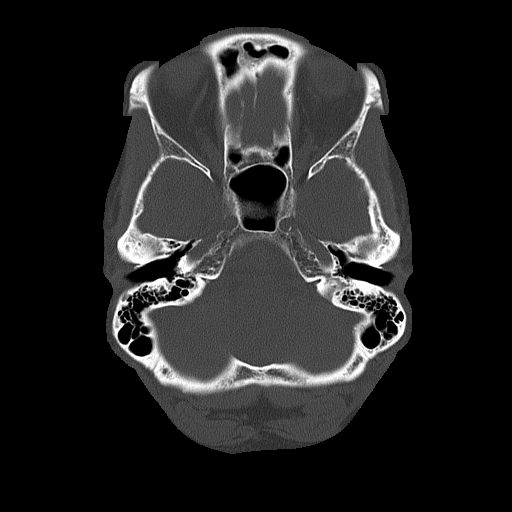
[im 14/67  brain]
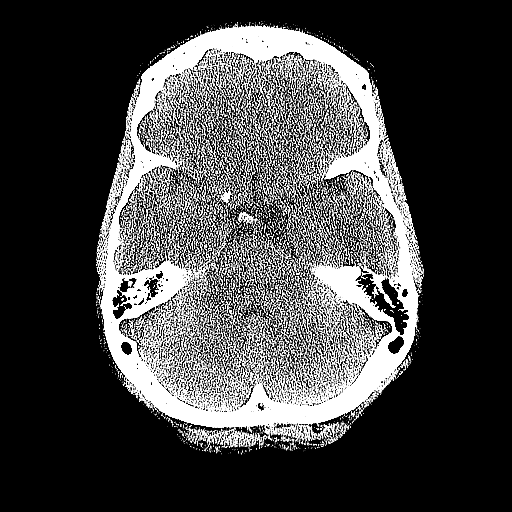
[im 20/67  brain]
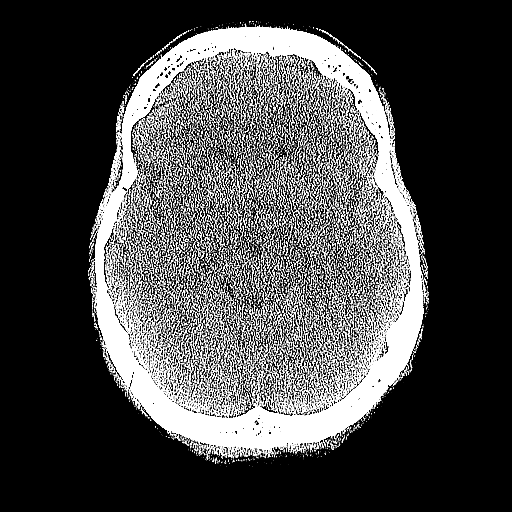
[im 27/67  brain]
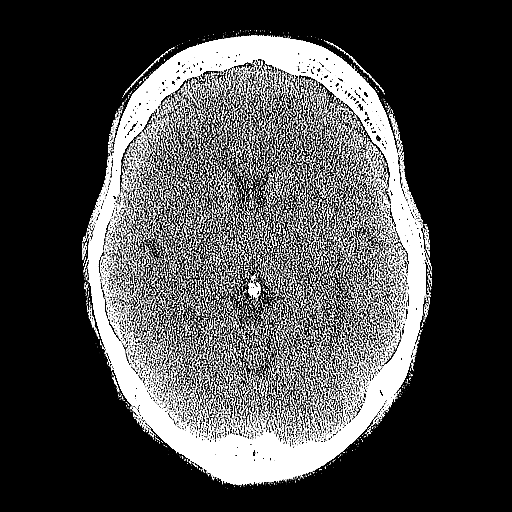
[im 34/67  brain]
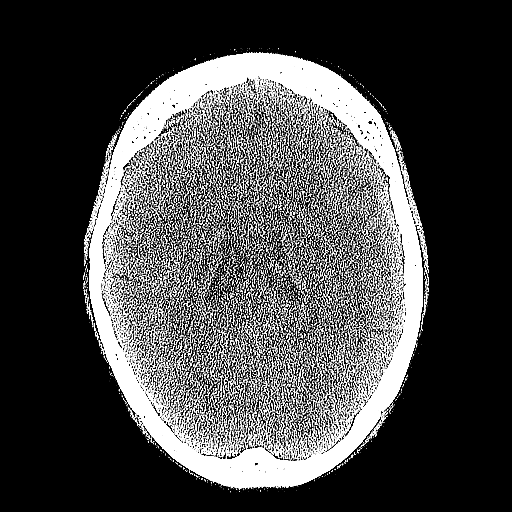
[im 34/67  bone]
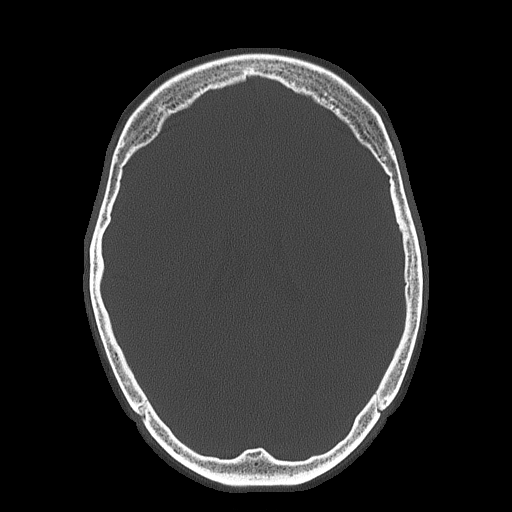
[im 40/67  brain]
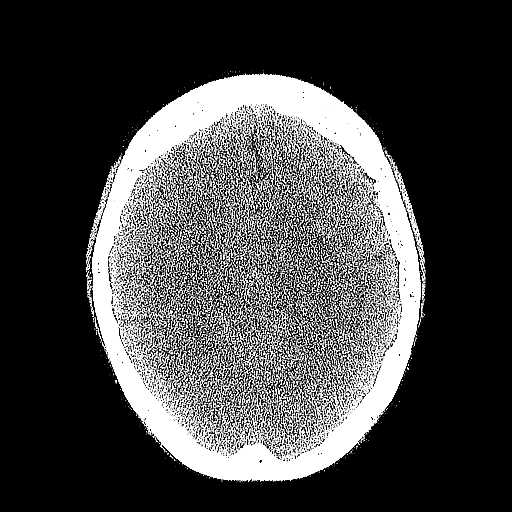
[im 47/67  brain]
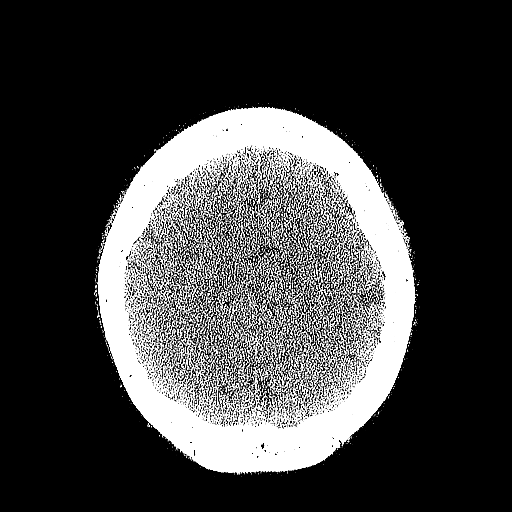
[im 53/67  brain]
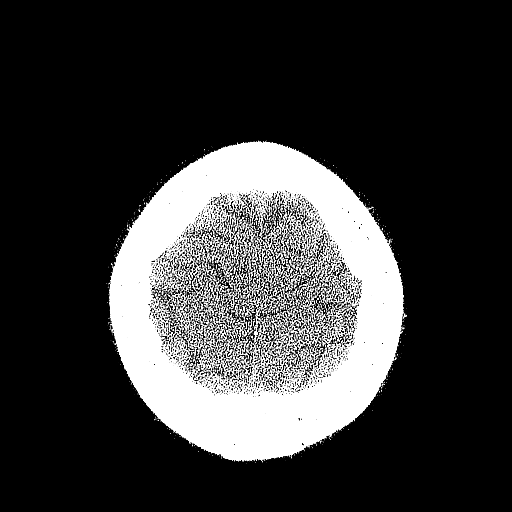
[im 60/67  brain]
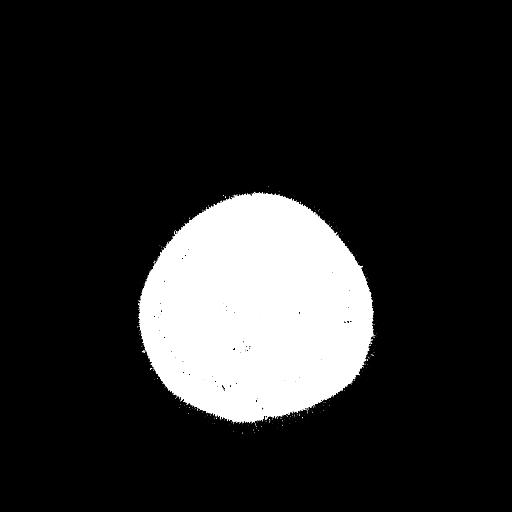
[im 60/67  bone]
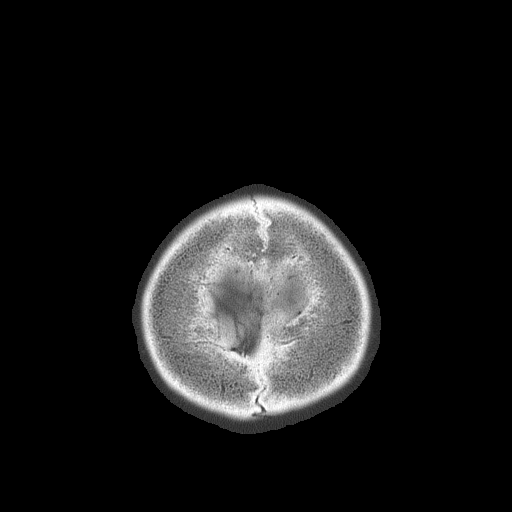

[Series 4: head 3.0 mpr cor · coronal · 0.28mm/px · 3 of 61 slices shown]
[im 21/61  brain]
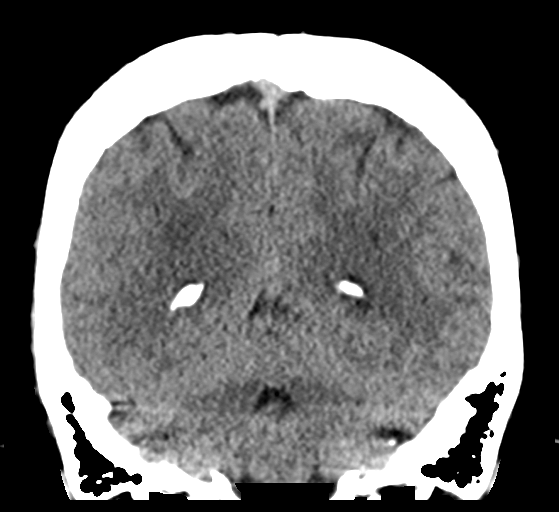
[im 27/61  brain]
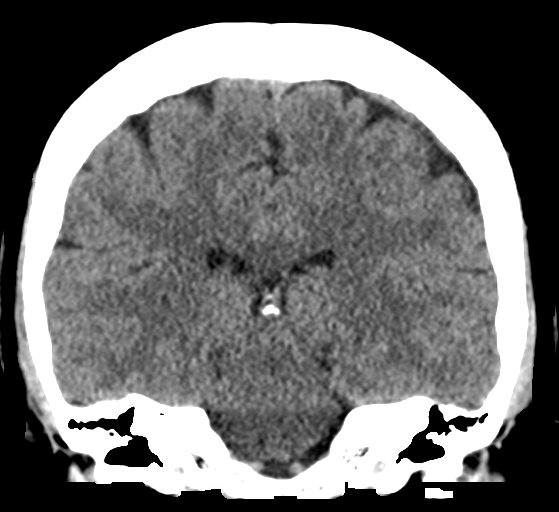
[im 34/61  brain]
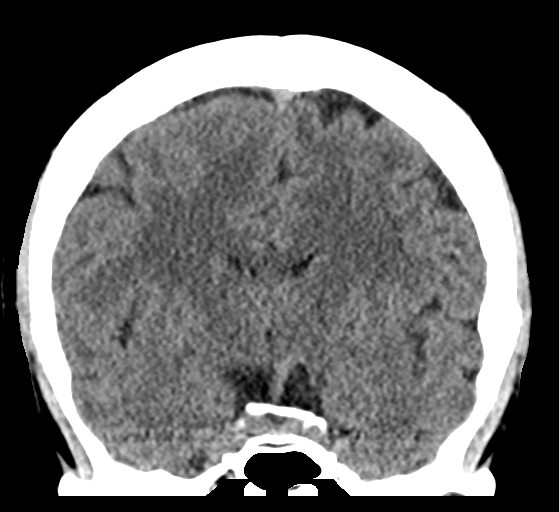

[Series 5: head 3.0 mpr sag · sagittal · 0.28mm/px · 3 of 50 slices shown]
[im 17/50  brain]
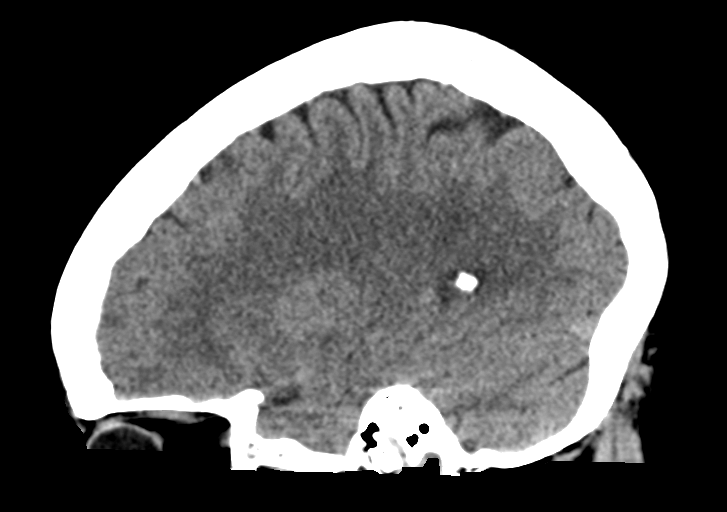
[im 25/50  brain]
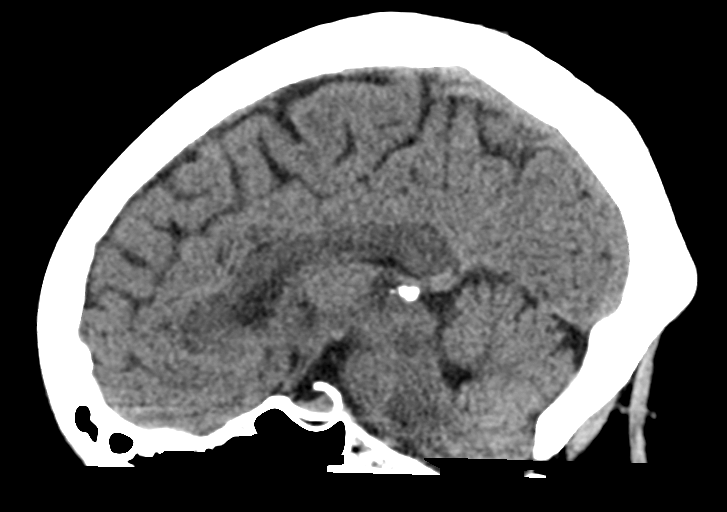
[im 33/50  brain]
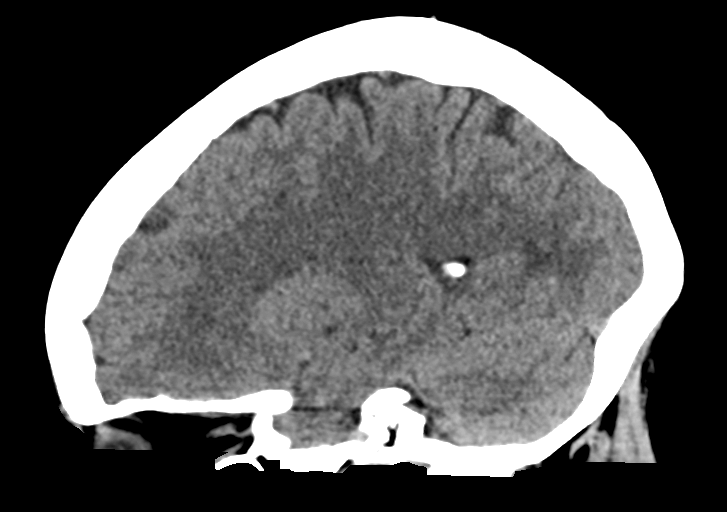

[15 of 47 positions shown; findings below may reference images not displayed]

FINDINGS: Brain:

Cerebral volume is normal.

Minimal patchy and ill-defined hypoattenuation within the cerebral
white matter, nonspecific but compatible with chronic small vessel
ischemic disease.

There is no acute intracranial hemorrhage.

No demarcated cortical infarct.

No extra-axial fluid collection.

No evidence of an intracranial mass.

No midline shift.

Vascular: No hyperdense vessel.  Atherosclerotic calcifications.

Skull: Normal. Negative for fracture or focal lesion.

Sinuses/Orbits: Visualized orbits show no acute finding. Trace
mucosal thickening within the bilateral ethmoid air cells.
IMPRESSION: No evidence of acute intracranial abnormality.

Minimal chronic small-vessel ischemic changes within the cerebral
white matter.

## 2022-11-15 ENCOUNTER — Encounter: Payer: Self-pay | Admitting: Gastroenterology

## 2022-11-15 ENCOUNTER — Ambulatory Visit (AMBULATORY_SURGERY_CENTER): Payer: Medicare PPO | Admitting: Gastroenterology

## 2022-11-15 VITALS — BP 94/70 | HR 66 | Temp 97.2°F | Resp 12 | Ht 62.5 in | Wt 125.0 lb

## 2022-11-15 DIAGNOSIS — E785 Hyperlipidemia, unspecified: Secondary | ICD-10-CM | POA: Diagnosis not present

## 2022-11-15 DIAGNOSIS — K625 Hemorrhage of anus and rectum: Secondary | ICD-10-CM | POA: Diagnosis not present

## 2022-11-15 DIAGNOSIS — G4730 Sleep apnea, unspecified: Secondary | ICD-10-CM | POA: Diagnosis not present

## 2022-11-15 DIAGNOSIS — I10 Essential (primary) hypertension: Secondary | ICD-10-CM | POA: Diagnosis not present

## 2022-11-15 MED ORDER — SODIUM CHLORIDE 0.9 % IV SOLN: 500.0000 mL | Freq: Once | INTRAVENOUS | Status: DC

## 2022-11-15 NOTE — Progress Notes (Signed)
To pacu, VSS. Report to [email or web address].tb 

## 2022-11-15 NOTE — Progress Notes (Signed)
Vitals-SH  Pt's states no medical or surgical changes since previsit or office visit. 

## 2022-11-15 NOTE — Progress Notes (Signed)
[facility] History and Physical   Primary Care Physician:  Tower, Audrie Gallus, MD   Reason for Procedure:  Rectal bleeding  Plan:    colonoscopy with possible interventions as needed     HPI: Jasmine Rollins is a very pleasant 72 y.o. female here for colonoscopy for rectal bleeding.  The risks and benefits as well as alternatives of endoscopic procedure(s) have been discussed and reviewed. All questions answered. The patient agrees to proceed.    Past Medical History:  Diagnosis Date   Allergic rhinitis    Arthritis    hip   Borderline high cholesterol    Cataract    Cough variant asthma    Diverticulosis of colon    GERD (gastroesophageal reflux disease)    HTN (hypertension)    Hyperglycemia    borderline DM   OP (osteoporosis)    Seasonal allergies    Sleep apnea    no cpap    Past Surgical History:  Procedure Laterality Date   COLONOSCOPY     COLPOSCOPY     ESOPHAGOGASTRODUODENOSCOPY  10/01   Negative   ROTATOR CUFF REPAIR     right   TUBAL LIGATION      Prior to Admission medications   Medication Sig Start Date End Date Taking? Authorizing Provider  alendronate (FOSAMAX) 70 MG tablet TAKE 1 TABLET EVERY 7 DAYS WITH A FULL GLASS OF WATER ON AN EMPTY STOMACH 09/13/22  Yes Tower, Marne A, MD  amLODipine (NORVASC) 5 MG tablet TAKE 1 TABLET DAILY 09/23/22  Yes Tower, Audrie Gallus, MD  Calcium Carb-Cholecalciferol 600-800 MG-UNIT TABS Take by mouth.   Yes [provider]  metFORMIN (GLUCOPHAGE-XR) 500 MG 24 hr tablet TAKE 1 TABLET DAILY WITH BREAKFAST 09/26/22  Yes Tower, Marne A, MD  montelukast (SINGULAIR) 10 MG tablet TAKE 1 TABLET AT BEDTIME 09/23/22  Yes Tower, Audrie Gallus, MD  olmesartan-hydrochlorothiazide (BENICAR HCT) 40-12.5 MG tablet TAKE 1 TABLET DAILY 08/11/22  Yes Tower, Audrie Gallus, MD  ROCKLATAN 0.02-0.005 % SOLN  04/25/22  Yes [provider]  albuterol (PROVENTIL HFA) 108 (90 Base) MCG/ACT inhaler INHALE 2 PUFFS EVERY 4 HOURS AS NEEDED  08/05/20   Tower, Audrie Gallus, MD  fluticasone (FLONASE) 50 MCG/ACT nasal spray Place 1 spray into both nostrils daily. 08/12/22   Tower, Audrie Gallus, MD  fluticasone (FLOVENT HFA) 44 MCG/ACT inhaler USE 2 INHALATIONS TWICE A DAY (RINSE MOUTH AFTER USE) 08/05/20   Tower, Audrie Gallus, MD  glucose blood (FREESTYLE LITE) test strip To check glucose once daily (dx. R73.03) 09/23/20   Tower, Audrie Gallus, MD  MAXITROL 3.5-10000-0.1 OINT Apply to eye. 11/09/22   [provider]  Omega-3 Fatty Acids (FISH OIL PO) Take 1 capsule by mouth daily.    [provider]  rosuvastatin (CRESTOR) 5 MG tablet Take one pill by mouth twice weekly 06/23/22   Tower, Audrie Gallus, MD    Current Outpatient Medications  Medication Sig Dispense Refill   alendronate (FOSAMAX) 70 MG tablet TAKE 1 TABLET EVERY 7 DAYS WITH A FULL GLASS OF WATER ON AN EMPTY STOMACH 12 tablet 3   amLODipine (NORVASC) 5 MG tablet TAKE 1 TABLET DAILY 90 tablet 1   Calcium Carb-Cholecalciferol 600-800 MG-UNIT TABS Take by mouth.     metFORMIN (GLUCOPHAGE-XR) 500 MG 24 hr tablet TAKE 1 TABLET DAILY WITH BREAKFAST 90 tablet 1   montelukast (SINGULAIR) 10 MG tablet TAKE 1 TABLET AT BEDTIME 90 tablet 1   olmesartan-hydrochlorothiazide (BENICAR HCT) 40-12.5 MG  tablet TAKE 1 TABLET DAILY 90 tablet 1   ROCKLATAN 0.02-0.005 % SOLN      albuterol (PROVENTIL HFA) 108 (90 Base) MCG/ACT inhaler INHALE 2 PUFFS EVERY 4 HOURS AS NEEDED 18 g 3   fluticasone (FLONASE) 50 MCG/ACT nasal spray Place 1 spray into both nostrils daily. 48 g 0   fluticasone (FLOVENT HFA) 44 MCG/ACT inhaler USE 2 INHALATIONS TWICE A DAY (RINSE MOUTH AFTER USE) 31.8 g 3   glucose blood (FREESTYLE LITE) test strip To check glucose once daily (dx. R73.03) 100 each 1   MAXITROL 3.5-10000-0.1 OINT Apply to eye.     Omega-3 Fatty Acids (FISH OIL PO) Take 1 capsule by mouth daily.     rosuvastatin (CRESTOR) 5 MG tablet Take one pill by mouth twice weekly 24 tablet 2   Current Facility-Administered  Medications  Medication Dose Route Frequency Provider Last Rate Last Admin   0.9 %  sodium chloride infusion  500 mL Intravenous Once Braidon Chermak, Eleonore Chiquito, MD        Allergies as of 11/15/2022 - Review Complete 11/15/2022  Allergen Reaction Noted   Augmentin [amoxicillin-pot clavulanate]  09/28/2018   Sulfonamide derivatives Rash 03/21/2007    Family History  Problem Relation Age of Onset   Hypertension Father    Diabetes Father    Diabetes Brother    Diabetes Brother    Cancer Brother        prostate   Cancer Brother        prostate   Cancer Brother        prostate   Cancer Brother        prostate   Cancer Brother        prostate   Cancer Brother        prostate   Breast cancer Maternal Aunt    Colon cancer Neg Hx    Rectal cancer Neg Hx    Stomach cancer Neg Hx    Esophageal cancer Neg Hx     Social History   Socioeconomic History   Marital status: Married    Spouse name: Not on file   Number of children: 1   Years of education: Not on file   Highest education level: Not on file  Occupational History   Occupation: Customer Service  Tobacco Use   Smoking status: Never   Smokeless tobacco: Never   Tobacco comments:    Remote 2nd hand exposure  Vaping Use   Vaping status: Never Used  Substance and Sexual Activity   Alcohol use: No    Alcohol/week: 0.0 standard drinks of alcohol   Drug use: No   Sexual activity: Yes  Other Topics Concern   Not on file  Social History Narrative   Not on file   Social Determinants of Health   Financial Resource Strain: Low Risk  (08/02/2022)   Overall Financial Resource Strain (CARDIA)    Difficulty of Paying Living Expenses: Not hard at all  Food Insecurity: No Food Insecurity (08/02/2022)   Hunger Vital Sign    Worried About Running Out of Food in the Last Year: Never true    Ran Out of Food in the Last Year: Never true  Transportation Needs: No Transportation Needs (08/02/2022)   PRAPARE - Scientist, research (physical sciences) (Medical): No    Lack of Transportation (Non-Medical): No  Physical Activity: Insufficiently Active (08/02/2022)   Exercise Vital Sign    Days of Exercise per Week: 4 days  Minutes of Exercise per Session: 30 min  Stress: No Stress Concern Present (08/02/2022)   [employer] of Occupational Health - Occupational Stress Questionnaire    Feeling of Stress : Not at all  Social Connections: Moderately Integrated (08/02/2022)   Social Connection and Isolation Panel [NHANES]    Frequency of Communication with Friends and Family: More than three times a week    Frequency of Social Gatherings with Friends and Family: More than three times a week    Attends Religious Services: More than 4 times per year    Active Member of [employer] or Organizations: No    Attends Banker Meetings: Never    Marital Status: Married  Catering manager Violence: Not At Risk (08/02/2022)   Humiliation, Afraid, Rape, and Kick questionnaire    Fear of Current or Ex-Partner: No    Emotionally Abused: No    Physically Abused: No    Sexually Abused: No    Review of Systems:  All other review of systems negative except as mentioned in the HPI.  Physical Exam: Vital signs in last 24 hours: BP 120/81 (BP Location: Left Arm, Patient Position: Sitting, Cuff Size: Normal)   Pulse 88   Temp (!) 97.2 F (36.2 C) (Temporal)   Ht 5' 2.5" (1.588 m)   Wt 125 lb (56.7 kg)   SpO2 96%   BMI 22.50 kg/m  General:   Alert, NAD Lungs:  Clear .   Heart:  Regular rate and rhythm Abdomen:  Soft, nontender and nondistended. Neuro/Psych:  Alert and cooperative. Normal mood and affect. A and O x 3  Reviewed labs, radiology imaging, old records and pertinent past GI work up  Patient is appropriate for planned procedure(s) and anesthesia in an ambulatory setting   K. Scherry Ran , MD [phone]

## 2022-11-15 NOTE — Patient Instructions (Addendum)
Continue present medications. No repeat colonoscopy due to age. Use Benefiber one teaspoon three times daily   YOU HAD AN ENDOSCOPIC PROCEDURE TODAY AT THE [facility]:   Refer to the procedure report that was given to you for any specific questions about what was found during the examination.  If the procedure report does not answer your questions, please call your gastroenterologist to clarify.  If you requested that your care partner not be given the details of your procedure findings, then the procedure report has been included in a sealed envelope for you to review at your convenience later.  YOU SHOULD EXPECT: Some feelings of bloating in the abdomen. Passage of more gas than usual.  Walking can help get rid of the air that was put into your GI tract during the procedure and reduce the bloating. If you had a lower endoscopy (such as a colonoscopy or flexible sigmoidoscopy) you may notice spotting of blood in your stool or on the toilet paper. If you underwent a bowel prep for your procedure, you may not have a normal bowel movement for a few days.  Please Note:  You might notice some irritation and congestion in your nose or some drainage.  This is from the oxygen used during your procedure.  There is no need for concern and it should clear up in a day or so.  SYMPTOMS TO REPORT IMMEDIATELY:  Following lower endoscopy (colonoscopy or flexible sigmoidoscopy):  Excessive amounts of blood in the stool  Significant tenderness or worsening of abdominal pains  Swelling of the abdomen that is new, acute  Fever of 100F or higher  For urgent or emergent issues, a gastroenterologist can be reached at any hour by calling [phone]. Do not use MyChart messaging for urgent concerns.    DIET:  We do recommend a small meal at first, but then you may proceed to your regular diet.  Drink plenty of fluids but you should avoid alcoholic beverages for 24 hours.  ACTIVITY:  You should  plan to take it easy for the rest of today and you should NOT DRIVE or use heavy machinery until tomorrow (because of the sedation medicines used during the test).    FOLLOW UP: Our staff will call the number listed on your records the next business day following your procedure.  We will call around 7:15- 8:00 am to check on you and address any questions or concerns that you may have regarding the information given to you following your procedure. If we do not reach you, we will leave a message.      SIGNATURES/CONFIDENTIALITY: You and/or your care partner have signed paperwork which will be entered into your electronic medical record.  These signatures attest to the fact that that the information above on your After Visit Summary has been reviewed and is understood.  Full responsibility of the confidentiality of this discharge information lies with you and/or your care-partner.

## 2022-11-15 NOTE — Op Note (Signed)
[facility] Patient Name: Jasmine Rollins Procedure Date: 11/15/2022 1:50 PM MRN: [id number] Endoscopist: Napoleon Form , MD, [id number] Age: 72 Referring MD:  Date of Birth: Jan 19, 1951 Gender: Female Account #: [id number] Procedure:                Colonoscopy Indications:              Evaluation of unexplained GI bleeding presenting                            with Hematochezia Medicines:                Monitored Anesthesia Care Procedure:                Pre-Anesthesia Assessment:                           - Prior to the procedure, a History and Physical                            was performed, and patient medications and                            allergies were reviewed. The patient's tolerance of                            previous anesthesia was also reviewed. The risks                            and benefits of the procedure and the sedation                            options and risks were discussed with the patient.                            All questions were answered, and informed consent                            was obtained. Prior Anticoagulants: The patient has                            taken no anticoagulant or antiplatelet agents. ASA                            Grade Assessment: II - A patient with mild systemic                            disease. After reviewing the risks and benefits,                            the patient was deemed in satisfactory condition to                            undergo the procedure.  After obtaining informed consent, the colonoscope                            was passed under direct vision. Throughout the                            procedure, the patient's blood pressure, pulse, and                            oxygen saturations were monitored continuously. The                            Olympus [id number]-[id number] (#[id number]) Colonoscope was                            introduced through the anus and advanced  to the the                            cecum, identified by appendiceal orifice and                            ileocecal valve. The colonoscopy was performed                            without difficulty. The patient tolerated the                            procedure well. The quality of the bowel                            preparation was good. The ileocecal valve,                            appendiceal orifice, and rectum were photographed. Scope In: 2:08:12 PM Scope Out: 2:22:59 PM Scope Withdrawal Time: 0 hours 7 minutes 17 seconds  Total Procedure Duration: 0 hours 14 minutes 47 seconds  Findings:                 The perianal and digital rectal examinations were                            normal.                           Multiple large-mouthed, medium-mouthed and                            small-mouthed diverticula were found in the sigmoid                            colon and descending colon. There was narrowing of                            the colon in association with the diverticular  opening. Erythema was seen in association with the                            diverticular opening. Peri-diverticular erythema                            was seen. There was evidence of an impacted                            diverticulum.                           Non-bleeding external and internal hemorrhoids were                            found during retroflexion. The hemorrhoids were                            large.                           The exam was otherwise without abnormality. Complications:            No immediate complications. Estimated Blood Loss:     Estimated blood loss: none. Impression:               - Severe diverticulosis in the sigmoid colon and in                            the descending colon. There was narrowing of the                            colon in association with the diverticular opening.                            Erythema was seen  in association with the                            diverticular opening. Peri-diverticular erythema                            was seen. There was evidence of an impacted                            diverticulum.                           - Non-bleeding external and internal hemorrhoids.                           - The examination was otherwise normal.                           - No specimens collected. Recommendation:           - Patient has a contact number available for  emergencies. The signs and symptoms of potential                            delayed complications were discussed with the                            patient. Return to normal activities tomorrow.                            Written discharge instructions were provided to the                            patient.                           - Resume previous diet.                           - Continue present medications.                           - No repeat colonoscopy due to age.                           - Use Benefiber one teaspoon PO TID. Napoleon Form, MD 11/15/2022 2:33:14 PM This report has been signed electronically.

## 2022-11-16 ENCOUNTER — Telehealth: Payer: Self-pay

## 2022-11-16 NOTE — Telephone Encounter (Signed)
  Follow up Call-     11/15/2022   12:53 PM 11/15/2022   12:48 PM  Call back number  Post procedure Call Back phone  # [phone]   Permission to leave phone message  Yes     Patient questions:  Do you have a fever, pain , or abdominal swelling? No. Pain Score  0 *  Have you tolerated food without any problems? Yes.    Have you been able to return to your normal activities? Yes.    Do you have any questions about your discharge instructions: Diet   No. Medications  No. Follow up visit  No.  Do you have questions or concerns about your Care? No.  Actions: * If pain score is 4 or above: No action needed, pain <4.

## 2022-11-17 ENCOUNTER — Telehealth: Payer: Self-pay

## 2022-11-17 ENCOUNTER — Other Ambulatory Visit: Payer: Self-pay

## 2022-11-17 DIAGNOSIS — R748 Abnormal levels of other serum enzymes: Secondary | ICD-10-CM

## 2022-11-17 NOTE — Telephone Encounter (Signed)
Ordered imaging at [facility]

## 2022-11-17 NOTE — Telephone Encounter (Signed)
-----   Message from [place] sent at 11/15/2022  4:09 PM EDT ----- Patient has elevated lipase of unclear etiology, will need to exclude pancrease neoplasm.  Please schedule MRI abd/pancreas (MRCP) next available.  Thanks

## 2022-11-21 DIAGNOSIS — G5602 Carpal tunnel syndrome, left upper limb: Secondary | ICD-10-CM | POA: Diagnosis not present

## 2022-11-24 DIAGNOSIS — M25632 Stiffness of left wrist, not elsewhere classified: Secondary | ICD-10-CM | POA: Diagnosis not present

## 2022-11-24 DIAGNOSIS — M25532 Pain in left wrist: Secondary | ICD-10-CM | POA: Diagnosis not present

## 2022-11-28 DIAGNOSIS — M799 Soft tissue disorder, unspecified: Secondary | ICD-10-CM | POA: Diagnosis not present

## 2022-11-28 DIAGNOSIS — M25532 Pain in left wrist: Secondary | ICD-10-CM | POA: Diagnosis not present

## 2022-11-28 DIAGNOSIS — M25632 Stiffness of left wrist, not elsewhere classified: Secondary | ICD-10-CM | POA: Diagnosis not present

## 2022-11-28 DIAGNOSIS — M6281 Muscle weakness (generalized): Secondary | ICD-10-CM | POA: Diagnosis not present

## 2022-11-29 ENCOUNTER — Encounter: Payer: Self-pay | Admitting: Family Medicine

## 2022-11-29 NOTE — Telephone Encounter (Signed)
Please schedule a visit with me, we will talk and check the lab  Thanks

## 2022-11-30 NOTE — Telephone Encounter (Signed)
Scheduled pt for tomorrow, 10/17 @ 10am

## 2022-11-30 NOTE — Progress Notes (Unsigned)
Subjective:    Patient ID: Jasmine Rollins, female    DOB: 1950/09/20, 72 y.o.   MRN: [id number]  HPI  Wt Readings from Last 3 Encounters:  12/01/22 126 lb 6 oz (57.3 kg)  11/15/22 125 lb (56.7 kg)  10/31/22 125 lb (56.7 kg)   22.75 kg/m  Vitals:   12/01/22 0951  BP: 126/78  Pulse: 74  Temp: 98 F (36.7 C)  SpO2: 99%    Pt presents to discuss elevated lipase level  Lab Results  Component Value Date   LIPASE 73.0 (H) 10/31/2022   A month prior it was 98  She was seen in ED on 10/22/22 for rectal bleeding  A/p at that time included the following Labs reviewed, these are stable.  She has mildly elevated lipase, no epigastric discomfort, no alcohol abuse.  No gallstones on imaging.  No evidence of pancreatitis on imaging my disposition for pancreatitis is fairly low at this point.  Imaging is revealing of diverticulosis without diverticulitis.  The bleeding has resolved spontaneously.  She has had normal bowel movement since the initial bowel movement with blood earlier today.  Her labs are stable.  She reports history of prior diverticulosis.  She follows with gastroenterology and has colonoscopy scheduled for next year.  Advised her to follow more closely in the next few weeks to gastroenterology for recheck.     She was then seen by GI on 9/16 Colonoscopy planned for the rectal bleeding (? Hemorrhoidal vs diverticular)  The following noted  *Elevated lipase: Had gone to the ED when she had the episode of rectal bleeding.  Lipase was elevated at 98, but CT scan showed a normal pancreas.  Will repeat lipase today.  Was not having any epigastric abdominal pain complaints, but was a little tender in the epigastrium on exam today.    No history of renal failure  No history of gallstones  No history of bowel obst   Other labs Lab Results  Component Value Date   WBC 4.0 10/22/2022   HGB 12.5 10/22/2022   HCT 37.7 10/22/2022   MCV 93.1 10/22/2022   PLT 287 10/22/2022    No results found for: "IRON", "TIBC", "FERRITIN" Lab Results  Component Value Date   NA 137 10/22/2022   K 3.7 10/22/2022   CO2 31 10/22/2022   GLUCOSE 93 10/22/2022   BUN 13 10/22/2022   CREATININE 0.65 10/22/2022   CALCIUM 10.6 (H) 10/22/2022   GFR 87.52 03/09/2022   GFRNONAA >60 10/22/2022   Lab Results  Component Value Date   ALT 14 10/22/2022   AST 15 10/22/2022   ALKPHOS 79 10/22/2022   BILITOT 0.5 10/22/2022   Once GGT was elevated on insurance physical at 73 No etoh  On re check here it was 70  From recent CT Hepatobiliary: There is an 11 mm cyst in the inferior right lobe of the liver. Gallbladder and bile ducts are within normal limits.   Pancreas: Unremarkable. No pancreatic ductal dilatation or surrounding inflammatory changes.   Today  Feels ok  No abdominal pain  No gas /bloating  No nausea or vomiting    Has MRI scheduled nov 13th now from GI   Has CT scan also from Dr Mena Goes     Patient Active Problem List   Diagnosis Date Noted   Elevated lipase 12/01/2022   Microscopic hematuria 09/29/2022   Prediabetes 09/21/2022   Wrist fracture, left 09/21/2022   Elevated serum GGT level 09/21/2022  Abnormal urinalysis 09/21/2022   Fall 09/05/2022   Hematuria 09/05/2022   Left wrist pain 08/03/2022   Scalp pain 12/02/2020   Neck pain 11/20/2020   Insomnia 11/05/2020   Right knee pain 11/22/2019   Elevated TSH 12/14/2015   Vitamin D deficiency 12/06/2015   GERD (gastroesophageal reflux disease) 06/09/2014   Encounter for routine gynecological examination 03/03/2014   Estrogen deficiency 03/03/2014   Heartburn 03/01/2013   Encounter for gynecological examination 06/22/2010   Special screening for malignant neoplasms, colon 06/22/2010   Glaucoma suspect 06/22/2010   Routine general medical examination at a health care facility 06/03/2010   Sleep apnea    Osteoporosis 05/20/2009   Sleep apnea 10/17/2008   COUGH VARIANT ASTHMA 07/30/2008    Leukocytopenia 01/29/2008   Controlled type 2 diabetes mellitus without complication, without long-term current use of insulin (HCC) 11/27/2007   Hyperlipidemia associated with type 2 diabetes mellitus (HCC) 03/21/2007   Essential hypertension 03/21/2007   Sinusitis, chronic 03/21/2007   Allergic rhinitis 03/21/2007   Diverticulosis of colon 03/21/2007   Past Medical History:  Diagnosis Date   Allergic rhinitis    Arthritis    hip   Borderline high cholesterol    Cataract    Cough variant asthma    Diverticulosis of colon    GERD (gastroesophageal reflux disease)    HTN (hypertension)    Hyperglycemia    borderline DM   OP (osteoporosis)    Seasonal allergies    Sleep apnea    no cpap   Past Surgical History:  Procedure Laterality Date   COLONOSCOPY     COLPOSCOPY     ESOPHAGOGASTRODUODENOSCOPY  10/01   Negative   ROTATOR CUFF REPAIR     right   TUBAL LIGATION     Social History   Tobacco Use   Smoking status: Never   Smokeless tobacco: Never   Tobacco comments:    Remote 2nd hand exposure  Vaping Use   Vaping status: Never Used  Substance Use Topics   Alcohol use: No    Alcohol/week: 0.0 standard drinks of alcohol   Drug use: No   Family History  Problem Relation Age of Onset   Hypertension Father    Diabetes Father    Diabetes Brother    Diabetes Brother    Cancer Brother        prostate   Cancer Brother        prostate   Cancer Brother        prostate   Cancer Brother        prostate   Cancer Brother        prostate   Cancer Brother        prostate   Breast cancer Maternal Aunt    Colon cancer Neg Hx    Rectal cancer Neg Hx    Stomach cancer Neg Hx    Esophageal cancer Neg Hx    Allergies  Allergen Reactions   Augmentin [Amoxicillin-Pot Clavulanate]     GI upset    Sulfonamide Derivatives Rash   Current Outpatient Medications on File Prior to Visit  Medication Sig Dispense Refill   albuterol (PROVENTIL HFA) 108 (90 Base) MCG/ACT  inhaler INHALE 2 PUFFS EVERY 4 HOURS AS NEEDED 18 g 3   alendronate (FOSAMAX) 70 MG tablet TAKE 1 TABLET EVERY 7 DAYS WITH A FULL GLASS OF WATER ON AN EMPTY STOMACH 12 tablet 3   amLODipine (NORVASC) 5 MG tablet TAKE 1 TABLET DAILY 90 tablet  1   Calcium Carb-Cholecalciferol 600-800 MG-UNIT TABS Take by mouth.     fluticasone (FLONASE) 50 MCG/ACT nasal spray Place 1 spray into both nostrils daily. 48 g 0   fluticasone (FLOVENT HFA) 44 MCG/ACT inhaler USE 2 INHALATIONS TWICE A DAY (RINSE MOUTH AFTER USE) 31.8 g 3   glucose blood (FREESTYLE LITE) test strip To check glucose once daily (dx. R73.03) 100 each 1   MAXITROL 3.5-10000-0.1 OINT Apply to eye.     metFORMIN (GLUCOPHAGE-XR) 500 MG 24 hr tablet TAKE 1 TABLET DAILY WITH BREAKFAST 90 tablet 1   montelukast (SINGULAIR) 10 MG tablet TAKE 1 TABLET AT BEDTIME 90 tablet 1   olmesartan-hydrochlorothiazide (BENICAR HCT) 40-12.5 MG tablet TAKE 1 TABLET DAILY 90 tablet 1   Omega-3 Fatty Acids (FISH OIL PO) Take 1 capsule by mouth daily.     ROCKLATAN 0.02-0.005 % SOLN      rosuvastatin (CRESTOR) 5 MG tablet Take one pill by mouth twice weekly 24 tablet 2   No current facility-administered medications on file prior to visit.    Review of Systems  Constitutional:  Negative for activity change, appetite change, fatigue, fever and unexpected weight change.  HENT:  Negative for congestion, ear pain, rhinorrhea, sinus pressure and sore throat.   Eyes:  Negative for pain, redness and visual disturbance.  Respiratory:  Negative for cough, shortness of breath and wheezing.   Cardiovascular:  Negative for chest pain and palpitations.  Gastrointestinal:  Negative for abdominal pain, blood in stool, constipation and diarrhea.  Endocrine: Negative for polydipsia and polyuria.  Genitourinary:  Negative for dysuria, frequency and urgency.  Musculoskeletal:  Negative for arthralgias, back pain and myalgias.  Skin:  Negative for pallor and rash.   Allergic/Immunologic: Negative for environmental allergies.  Neurological:  Negative for dizziness, syncope and headaches.  Hematological:  Negative for adenopathy. Does not bruise/bleed easily.  Psychiatric/Behavioral:  Negative for decreased concentration and dysphoric mood. The patient is not nervous/anxious.        Objective:   Physical Exam Constitutional:      General: She is not in acute distress.    Appearance: Normal appearance. She is well-developed and normal weight. She is not ill-appearing or diaphoretic.  HENT:     Head: Normocephalic and atraumatic.  Eyes:     Conjunctiva/sclera: Conjunctivae normal.     Pupils: Pupils are equal, round, and reactive to light.  Neck:     Thyroid: No thyromegaly.     Vascular: No carotid bruit or JVD.  Cardiovascular:     Rate and Rhythm: Normal rate and regular rhythm.     Heart sounds: Normal heart sounds.     No gallop.  Pulmonary:     Effort: Pulmonary effort is normal. No respiratory distress.     Breath sounds: Normal breath sounds. No wheezing or rales.  Abdominal:     General: There is no distension or abdominal bruit.     Palpations: Abdomen is soft.  Musculoskeletal:     Cervical back: Normal range of motion and neck supple.     Right lower leg: No edema.     Left lower leg: No edema.  Lymphadenopathy:     Cervical: No cervical adenopathy.  Skin:    General: Skin is warm and dry.     Coloration: Skin is not pale.     Findings: No rash.  Neurological:     Mental Status: She is alert.     Coordination: Coordination normal.  Deep Tendon Reflexes: Reflexes are normal and symmetric. Reflexes normal.  Psychiatric:        Mood and Affect: Mood normal.           Assessment & Plan:   Problem List Items Addressed This Visit       Cardiovascular and Mediastinum   Essential hypertension    bp in fair control at this time  BP Readings from Last 1 Encounters:  12/01/22 126/78   No changes needed Most  recent labs reviewed  Disc lifstyle change with low sodium diet and exercise  Plan to continue Amlodipine 5 mg daily  benicar hct 40-12.5 mg daily   No longer taking nsaid Lab today      Relevant Orders   Basic metabolic panel     Digestive   Diverticulosis of colon    Noted on recent colonoscopy  Unsure if prior bleeding was from this or hemorrhoids No symptoms currently   Reviewed last GI note and EGD report today        Genitourinary   Microscopic hematuria    Seeing urology for micro hematuria  Planning renal CT Then likely cystoscopy  Reviewed note today   No symptoms          Other   Elevated lipase - Primary    Elevated in ER at 98 during visit for rectal bleeding (no abd pain)  Then re check was lower but still mildly elevated  Lab Results  Component Value Date   LIPASE 73.0 (H) 10/31/2022   Reviewed hospital records, lab results and studies in detail   Also reviewed last GI notes No symptoms  No alcohol  Normal CT abd/pelvis No history of fatty liver  No history of renal failure or gallstones or bowel obst   Normal exam today   MRI of pancreas is planned as well (MRCP)  Has history of isolated increase GGT with normal iver tests   Lab today        Relevant Orders   Basic metabolic panel   Lipase   Gamma GT   Amylase   Hepatic function panel   Elevated serum GGT level    This was elevated in past from insurance physical  Repeated here and it was 70 No alcohol (does not drink at all) No symptoms  Normal LFT and recent normal CT abd/pelvis   Does have elevated lipase/ ? Reason   Lab today      Relevant Orders   Basic metabolic panel   Lipase   Gamma GT   Amylase   Hepatic function panel

## 2022-12-01 ENCOUNTER — Encounter: Payer: Self-pay | Admitting: Family Medicine

## 2022-12-01 ENCOUNTER — Ambulatory Visit (INDEPENDENT_AMBULATORY_CARE_PROVIDER_SITE_OTHER): Payer: Medicare PPO | Admitting: Family Medicine

## 2022-12-01 VITALS — BP 126/78 | HR 74 | Temp 98.0°F | Ht 62.5 in | Wt 126.4 lb

## 2022-12-01 DIAGNOSIS — R3129 Other microscopic hematuria: Secondary | ICD-10-CM | POA: Diagnosis not present

## 2022-12-01 DIAGNOSIS — I10 Essential (primary) hypertension: Secondary | ICD-10-CM

## 2022-12-01 DIAGNOSIS — K5730 Diverticulosis of large intestine without perforation or abscess without bleeding: Secondary | ICD-10-CM

## 2022-12-01 DIAGNOSIS — R748 Abnormal levels of other serum enzymes: Secondary | ICD-10-CM

## 2022-12-01 LAB — HEPATIC FUNCTION PANEL
ALT: 19 U/L (ref 0–35)
AST: 15 U/L (ref 0–37)
Albumin: 4.2 g/dL (ref 3.5–5.2)
Alkaline Phosphatase: 89 U/L (ref 39–117)
Bilirubin, Direct: 0.2 mg/dL (ref 0.0–0.3)
Total Bilirubin: 0.8 mg/dL (ref 0.2–1.2)
Total Protein: 7 g/dL (ref 6.0–8.3)

## 2022-12-01 LAB — BASIC METABOLIC PANEL
BUN: 10 mg/dL (ref 6–23)
CO2: 33 meq/L — ABNORMAL HIGH (ref 19–32)
Calcium: 9.9 mg/dL (ref 8.4–10.5)
Chloride: 101 meq/L (ref 96–112)
Creatinine, Ser: 0.63 mg/dL (ref 0.40–1.20)
GFR: 88.05 mL/min (ref >60.00)
Glucose, Bld: 110 mg/dL — ABNORMAL HIGH (ref 70–99)
Potassium: 4.1 meq/L (ref 3.5–5.1)
Sodium: 140 meq/L (ref 135–145)

## 2022-12-01 LAB — AMYLASE: Amylase: 79 U/L (ref 27–131)

## 2022-12-01 LAB — LIPASE: Lipase: 66 U/L — ABNORMAL HIGH (ref 11.0–59.0)

## 2022-12-01 LAB — GAMMA GT: GGT: 30 U/L (ref 7–51)

## 2022-12-01 NOTE — Assessment & Plan Note (Signed)
This was elevated in past from insurance physical  Repeated here and it was 70 No alcohol (does not drink at all) No symptoms  Normal LFT and recent normal CT abd/pelvis   Does have elevated lipase/ ? Reason   Lab today

## 2022-12-01 NOTE — Patient Instructions (Signed)
Avoid alcohol  Let us know if you develop any abdominal pain or nausea or vomiting   Take care of yourself   Labs today   Aware that the MRI is planned

## 2022-12-01 NOTE — Assessment & Plan Note (Signed)
bp in fair control at this time  BP Readings from Last 1 Encounters:  12/01/22 126/78   No changes needed Most recent labs reviewed  Disc lifstyle change with low sodium diet and exercise  Plan to continue Amlodipine 5 mg daily  benicar hct 40-12.5 mg daily   No longer taking nsaid Lab today

## 2022-12-01 NOTE — Assessment & Plan Note (Signed)
Noted on recent colonoscopy  Unsure if prior bleeding was from this or hemorrhoids No symptoms currently   Reviewed last GI note and EGD report today

## 2022-12-01 NOTE — Assessment & Plan Note (Signed)
Seeing urology for micro hematuria  Planning renal CT Then likely cystoscopy  Reviewed note today   No symptoms

## 2022-12-01 NOTE — Assessment & Plan Note (Addendum)
Elevated in ER at 98 during visit for rectal bleeding (no abd pain)  Then re check was lower but still mildly elevated  Lab Results  Component Value Date   LIPASE 73.0 (H) 10/31/2022   Reviewed hospital records, lab results and studies in detail   Also reviewed last GI notes No symptoms  No alcohol  Normal CT abd/pelvis No history of fatty liver  No history of renal failure or gallstones or bowel obst   Normal exam today   MRI of pancreas is planned as well (MRCP)  Has history of isolated increase GGT with normal iver tests   Lab today

## 2022-12-02 DIAGNOSIS — M6281 Muscle weakness (generalized): Secondary | ICD-10-CM | POA: Diagnosis not present

## 2022-12-02 DIAGNOSIS — M25632 Stiffness of left wrist, not elsewhere classified: Secondary | ICD-10-CM | POA: Diagnosis not present

## 2022-12-02 DIAGNOSIS — M25532 Pain in left wrist: Secondary | ICD-10-CM | POA: Diagnosis not present

## 2022-12-02 DIAGNOSIS — M799 Soft tissue disorder, unspecified: Secondary | ICD-10-CM | POA: Diagnosis not present

## 2022-12-05 DIAGNOSIS — M25532 Pain in left wrist: Secondary | ICD-10-CM | POA: Diagnosis not present

## 2022-12-05 DIAGNOSIS — M799 Soft tissue disorder, unspecified: Secondary | ICD-10-CM | POA: Diagnosis not present

## 2022-12-05 DIAGNOSIS — M25632 Stiffness of left wrist, not elsewhere classified: Secondary | ICD-10-CM | POA: Diagnosis not present

## 2022-12-05 DIAGNOSIS — M6281 Muscle weakness (generalized): Secondary | ICD-10-CM | POA: Diagnosis not present

## 2022-12-07 DIAGNOSIS — N2889 Other specified disorders of kidney and ureter: Secondary | ICD-10-CM | POA: Diagnosis not present

## 2022-12-07 DIAGNOSIS — R3121 Asymptomatic microscopic hematuria: Secondary | ICD-10-CM | POA: Diagnosis not present

## 2022-12-07 DIAGNOSIS — N281 Cyst of kidney, acquired: Secondary | ICD-10-CM | POA: Diagnosis not present

## 2022-12-07 DIAGNOSIS — K5730 Diverticulosis of large intestine without perforation or abscess without bleeding: Secondary | ICD-10-CM | POA: Diagnosis not present

## 2022-12-07 DIAGNOSIS — K7689 Other specified diseases of liver: Secondary | ICD-10-CM | POA: Diagnosis not present

## 2022-12-08 DIAGNOSIS — M25632 Stiffness of left wrist, not elsewhere classified: Secondary | ICD-10-CM | POA: Diagnosis not present

## 2022-12-08 DIAGNOSIS — M799 Soft tissue disorder, unspecified: Secondary | ICD-10-CM | POA: Diagnosis not present

## 2022-12-08 DIAGNOSIS — M25532 Pain in left wrist: Secondary | ICD-10-CM | POA: Diagnosis not present

## 2022-12-08 DIAGNOSIS — M6281 Muscle weakness (generalized): Secondary | ICD-10-CM | POA: Diagnosis not present

## 2022-12-12 ENCOUNTER — Emergency Department: Payer: Medicare PPO

## 2022-12-12 ENCOUNTER — Other Ambulatory Visit: Payer: Self-pay

## 2022-12-12 ENCOUNTER — Emergency Department
Admission: EM | Admit: 2022-12-12 | Discharge: 2022-12-12 | Disposition: A | Discharge status: Home / Self Care | Payer: Medicare PPO | Attending: Emergency Medicine | Admitting: Emergency Medicine

## 2022-12-12 DIAGNOSIS — M25512 Pain in left shoulder: Secondary | ICD-10-CM | POA: Insufficient documentation

## 2022-12-12 DIAGNOSIS — E119 Type 2 diabetes mellitus without complications: Secondary | ICD-10-CM | POA: Diagnosis not present

## 2022-12-12 DIAGNOSIS — R0789 Other chest pain: Secondary | ICD-10-CM | POA: Diagnosis not present

## 2022-12-12 DIAGNOSIS — M542 Cervicalgia: Secondary | ICD-10-CM | POA: Diagnosis not present

## 2022-12-12 DIAGNOSIS — E042 Nontoxic multinodular goiter: Secondary | ICD-10-CM | POA: Diagnosis not present

## 2022-12-12 DIAGNOSIS — I10 Essential (primary) hypertension: Secondary | ICD-10-CM | POA: Insufficient documentation

## 2022-12-12 DIAGNOSIS — K429 Umbilical hernia without obstruction or gangrene: Secondary | ICD-10-CM | POA: Diagnosis not present

## 2022-12-12 DIAGNOSIS — I77810 Thoracic aortic ectasia: Secondary | ICD-10-CM | POA: Diagnosis not present

## 2022-12-12 DIAGNOSIS — R079 Chest pain, unspecified: Secondary | ICD-10-CM | POA: Diagnosis not present

## 2022-12-12 DIAGNOSIS — K5730 Diverticulosis of large intestine without perforation or abscess without bleeding: Secondary | ICD-10-CM | POA: Diagnosis not present

## 2022-12-12 LAB — BASIC METABOLIC PANEL
Anion gap: 7 (ref 5–15)
BUN: 12 mg/dL (ref 8–23)
CO2: 29 mmol/L (ref 22–32)
Calcium: 9.5 mg/dL (ref 8.9–10.3)
Chloride: 106 mmol/L (ref 98–111)
Creatinine, Ser: 0.52 mg/dL (ref 0.44–1.00)
GFR, Estimated: >60 mL/min (ref >60)
Glucose, Bld: 105 mg/dL — ABNORMAL HIGH (ref 70–99)
Potassium: 3.7 mmol/L (ref 3.5–5.1)
Sodium: 142 mmol/L (ref 135–145)

## 2022-12-12 LAB — CBC
HCT: 38.2 % (ref 36.0–46.0)
Hemoglobin: 12.6 g/dL (ref 12.0–15.0)
MCH: 30.8 pg (ref 26.0–34.0)
MCHC: 33 g/dL (ref 30.0–36.0)
MCV: 93.4 fL (ref 80.0–100.0)
Platelets: 234 10*3/uL (ref 150–400)
RBC: 4.09 MIL/uL (ref 3.87–5.11)
RDW: 12.1 % (ref 11.5–15.5)
WBC: 2.5 10*3/uL — ABNORMAL LOW (ref 4.0–10.5)
nRBC: 0 % (ref 0.0–0.2)

## 2022-12-12 LAB — TROPONIN I (HIGH SENSITIVITY)
Troponin I (High Sensitivity): 2 ng/L (ref <18)
Troponin I (High Sensitivity): 2 ng/L (ref <18)

## 2022-12-12 LAB — HEPATIC FUNCTION PANEL
ALT: 21 U/L (ref 0–44)
AST: 15 U/L (ref 15–41)
Albumin: 3.9 g/dL (ref 3.5–5.0)
Alkaline Phosphatase: 70 U/L (ref 38–126)
Bilirubin, Direct: <0.1 mg/dL (ref 0.0–0.2)
Total Bilirubin: 0.9 mg/dL (ref 0.3–1.2)
Total Protein: 7.2 g/dL (ref 6.5–8.1)

## 2022-12-12 LAB — D-DIMER, QUANTITATIVE: D-Dimer, Quant: 0.88 ug{FEU}/mL — ABNORMAL HIGH (ref 0.00–0.50)

## 2022-12-12 LAB — LIPASE, BLOOD: Lipase: 37 U/L (ref 11–51)

## 2022-12-12 MED ORDER — KETOROLAC TROMETHAMINE 15 MG/ML IJ SOLN
15.0000 mg | Freq: Once | INTRAMUSCULAR | Status: AC
  Administered 2022-12-12: 15 mg via INTRAVENOUS
  Filled 2022-12-12: qty 1

## 2022-12-12 MED ORDER — LIDOCAINE 5 % EX PTCH
1.0000 | MEDICATED_PATCH | CUTANEOUS | Status: DC
  Administered 2022-12-12: 1 via TRANSDERMAL
  Filled 2022-12-12: qty 1

## 2022-12-12 MED ORDER — IOHEXOL 350 MG/ML SOLN
120.0000 mL | Freq: Once | INTRAVENOUS | Status: AC | PRN
  Administered 2022-12-12: 120 mL via INTRAVENOUS

## 2022-12-12 MED ORDER — ASPIRIN 81 MG PO CHEW
324.0000 mg | CHEWABLE_TABLET | Freq: Once | ORAL | Status: AC
  Administered 2022-12-12: 324 mg via ORAL
  Filled 2022-12-12: qty 4

## 2022-12-12 MED ORDER — LIDOCAINE 5 % EX PTCH: 1.0000 | MEDICATED_PATCH | Freq: Two times a day (BID) | CUTANEOUS | 0 refills | Status: AC

## 2022-12-12 NOTE — Discharge Instructions (Addendum)
Fortunately your testing in the emergency department did not show any emergency conditions that account for your pain.  Take acetaminophen 650 mg and ibuprofen 400 mg every 6 hours for pain.  Take with food. Use pain patches as well.  You can try using heating pad which can help with muscle pain.  Thank you for choosing [place] for your health care today!  Please see your primary doctor this week for a follow up appointment.   If you have any new, worsening, or unexpected symptoms call your doctor right away or come back to the emergency department for reevaluation.  It was my pleasure to care for you today.   Daneil Dan Modesto Charon, MD

## 2022-12-12 NOTE — ED Triage Notes (Signed)
Pt comes with c/p that started about 10 minutes ago. Pt states it started at back of upper shoulder and radiates to left sided chest. Pt states no sob. Pt states pain in neck also. Pt states her arms felt clammy. Pt states burning sensation.

## 2022-12-12 NOTE — ED Provider Notes (Signed)
Care of this patient assumed from prior physician at 1600 pending CT, reevaluation, and disposition. Please see prior physician note for further details.  Briefly this is a 72 year old female who presented with neck pain with reproducible tenderness to palpation on exam.  Labs were reassuring, but D-dimer was elevated at 0.88, so CT angio of the head, neck, chest, abdomen, and pelvis were ordered to further evaluate.  These were fortunately without acute vascular pathology or other emergent findings.  Patient reevaluated and updated on the results of her workup.  She was eager to be discharged home.  Strict return precautions were provided.  Patient was discharged stable condition.   Trinna Post, MD 12/12/22 [phone]

## 2022-12-12 NOTE — ED Provider Notes (Signed)
[facility] Provider Note    Event Date/Time   First MD Initiated Contact with Patient 12/12/22 1034     (approximate)   History   Chest Pain   HPI  Jasmine Rollins is a 72 y.o. female   Past medical history of hypertension, diabetes, hyperlipidemia, who presents to the emergency department with left shoulder/base of neck/chest pain.  Started this morning.  No obvious inciting events.  Feels a get tightness soreness mostly in her left trapezius that radiates up toward the base of her neck and wraps around the front to her left chest.  She denies any shortness of breath, cough, fever.  There is no exertional component.  It feels worse when she presses on the shoulder.  Denies any GI or GU symptoms.  She has no visual changes.  She denies headache.  She denies no focal weakness or numbness.  Independent Historian contributed to assessment above: Her husband is at bedside to corroborate information past medical history as above     Physical Exam   Triage Vital Signs: ED Triage Vitals  Encounter Vitals Group     BP 12/12/22 1037 (!) 155/94     Systolic BP Percentile --      Diastolic BP Percentile --      Pulse Rate 12/12/22 1037 99     Resp 12/12/22 1037 18     Temp 12/12/22 1037 98.5 F (36.9 C)     Temp src --      SpO2 12/12/22 1037 98 %     Weight 12/12/22 1027 120 lb (54.4 kg)     Height 12/12/22 1027 5\' 2"  (1.575 m)     Head Circumference --      Peak Flow --      Pain Score 12/12/22 1027 6     Pain Loc --      Pain Education --      Exclude from Growth Chart --     Most recent vital signs: Vitals:   12/12/22 1230 12/12/22 1300  BP: (!) 152/99 131/83  Pulse: 67 62  Resp: 20 16  Temp:    SpO2: 100% 100%    General: Awake, no distress.  CV:  Good peripheral perfusion.  Resp:  Normal effort.  Abd:  No distention.  Other:  Pleasant woman slightly hypertensive otherwise normal vital signs, no acute distress.  She does have  tenderness to palpation of the left trapezius area mainly as well as the left base of the neck, and anterior shoulder.  She does have full active range of motion of the shoulder no obvious swelling erythema or warmth.  Neurovascular intact to the affected left upper extremity with good strong distal pulses.  Her lungs are clear.  Her abdomen is soft and nontender.  Her neck is supple with full range of motion.   ED Results / Procedures / Treatments   Labs (all labs ordered are listed, but only abnormal results are displayed) Labs Reviewed  BASIC METABOLIC PANEL - Abnormal; Notable for the following components:      Result Value   Glucose, Bld 105 (*)    All other components within normal limits  D-DIMER, QUANTITATIVE - Abnormal; Notable for the following components:   D-Dimer, Quant 0.88 (*)    All other components within normal limits  CBC - Abnormal; Notable for the following components:   WBC 2.5 (*)    All other components within normal limits  HEPATIC FUNCTION PANEL  LIPASE, BLOOD  TROPONIN I (HIGH SENSITIVITY)  TROPONIN I (HIGH SENSITIVITY)     I ordered and reviewed the above labs they are notable for D-dimer 0.88  EKG  ED ECG REPORT I, Pilar Jarvis, the attending physician, personally viewed and interpreted this ECG.   Date: 12/12/2022  EKG Time: 1034  Rate: 66  Rhythm: sinus  Axis: nl  Intervals:none  ST&T Change: no stemi    RADIOLOGY I independently reviewed and interpreted chest x-ray and I see no obvious focality or pneumothorax I also reviewed radiologist's formal read.   PROCEDURES:  Critical Care performed: No  Procedures   MEDICATIONS ORDERED IN ED: Medications  lidocaine (LIDODERM) 5 % 1 patch (1 patch Transdermal Patch Applied 12/12/22 1228)  aspirin chewable tablet 324 mg (324 mg Oral Given 12/12/22 1121)  ketorolac (TORADOL) 15 MG/ML injection 15 mg (15 mg Intravenous Given 12/12/22 1227)  iohexol (OMNIPAQUE) 350 MG/ML injection 120 mL (120  mLs Intravenous Contrast Given 12/12/22 1322)     IMPRESSION / MDM / ASSESSMENT AND PLAN / ED COURSE  I reviewed the triage vital signs and the nursing notes.                                Patient's presentation is most consistent with acute presentation with potential threat to life or bodily function.  Differential diagnosis includes, but is not limited to, muscle strain, muscle spasm, ACS, dissection vertebral/carotid, dissection aortic, PE   The patient is on the cardiac monitor to evaluate for evidence of arrhythmia and/or significant heart rate changes.  MDM:    Most consistent with muscle strain given tenderness to palpation to the trapezius muscle but no obvious inciting event, given age, cardiac risk factors, pain radiating up through the neck and into the chest, I considered dissection.  Given lab testing including troponins, D-dimer, for restratification and advanced imaging as needed.  Dimer mildly elevated.  Will proceed with CT angiogram rule out dissection cervical or thoracic.  Troponins have been flat.  Patient has been stable.  Awaiting the results of the CT angiograms, if negative plan will be for discharge home for msk pain most likely.        FINAL CLINICAL IMPRESSION(S) / ED DIAGNOSES   Final diagnoses:  Neck pain  Acute pain of left shoulder  Nonspecific chest pain     Rx / DC Orders   ED Discharge Orders          Ordered    lidocaine (LIDODERM) 5 %  Every 12 hours        12/12/22 1346             Note:  This document was prepared using Dragon voice recognition software and may include unintentional dictation errors.    Pilar Jarvis, MD 12/12/22 [phone]

## 2022-12-14 ENCOUNTER — Encounter: Payer: Self-pay | Admitting: Gastroenterology

## 2022-12-15 DIAGNOSIS — M799 Soft tissue disorder, unspecified: Secondary | ICD-10-CM | POA: Diagnosis not present

## 2022-12-15 DIAGNOSIS — M6281 Muscle weakness (generalized): Secondary | ICD-10-CM | POA: Diagnosis not present

## 2022-12-15 DIAGNOSIS — M25532 Pain in left wrist: Secondary | ICD-10-CM | POA: Diagnosis not present

## 2022-12-15 DIAGNOSIS — M25632 Stiffness of left wrist, not elsewhere classified: Secondary | ICD-10-CM | POA: Diagnosis not present

## 2022-12-19 DIAGNOSIS — M25632 Stiffness of left wrist, not elsewhere classified: Secondary | ICD-10-CM | POA: Diagnosis not present

## 2022-12-19 DIAGNOSIS — M799 Soft tissue disorder, unspecified: Secondary | ICD-10-CM | POA: Diagnosis not present

## 2022-12-19 DIAGNOSIS — M19032 Primary osteoarthritis, left wrist: Secondary | ICD-10-CM | POA: Diagnosis not present

## 2022-12-19 DIAGNOSIS — M25532 Pain in left wrist: Secondary | ICD-10-CM | POA: Diagnosis not present

## 2022-12-19 DIAGNOSIS — M6281 Muscle weakness (generalized): Secondary | ICD-10-CM | POA: Diagnosis not present

## 2022-12-20 ENCOUNTER — Encounter: Payer: Self-pay | Admitting: Family Medicine

## 2022-12-20 ENCOUNTER — Encounter: Payer: Self-pay | Admitting: Gastroenterology

## 2022-12-20 ENCOUNTER — Ambulatory Visit (INDEPENDENT_AMBULATORY_CARE_PROVIDER_SITE_OTHER): Payer: Medicare PPO | Admitting: Family Medicine

## 2022-12-20 VITALS — BP 122/74 | HR 85 | Temp 99.1°F | Ht 62.0 in | Wt 127.1 lb

## 2022-12-20 DIAGNOSIS — S62102S Fracture of unspecified carpal bone, left wrist, sequela: Secondary | ICD-10-CM

## 2022-12-20 DIAGNOSIS — R748 Abnormal levels of other serum enzymes: Secondary | ICD-10-CM

## 2022-12-20 DIAGNOSIS — M542 Cervicalgia: Secondary | ICD-10-CM

## 2022-12-20 NOTE — Assessment & Plan Note (Signed)
Seen in ER on 10/28 for neck pain that radiated to chest  Reviewed hospital records, lab results and studies in detail   Reassuring work up including imaging of neck/body and vascular assessment   Suspect this was msk in origin  Improved with ketorolac and lidocaine patch  May have injured doing PT of hand   Reassuring exam today  Instructed to call if symptoms return

## 2022-12-20 NOTE — Progress Notes (Signed)
Subjective:    Patient ID: Jasmine Rollins, female    DOB: November 27, 1950, 72 y.o.   MRN: [id number]  HPI  Wt Readings from Last 3 Encounters:  12/20/22 127 lb 2 oz (57.7 kg)  12/12/22 120 lb (54.4 kg)  12/01/22 126 lb 6 oz (57.3 kg)   23.25 kg/m  Vitals:   12/20/22 1424  BP: 122/74  Pulse: 85  Temp: 99.1 F (37.3 C)  SpO2: 99%   Pt presents for follow up of ER visit for neck pain  Left trapezius area  Tight and sore  Radiates to front of left chest   Seen on 12/12/22  Per note Briefly this is a 72 year old female who presented with neck pain with reproducible tenderness to palpation on exam. Labs were reassuring, but D-dimer was elevated at 0.88, so CT angio of the head, neck, chest, abdomen, and pelvis were ordered to further evaluate. These were fortunately without acute vascular pathology or other emergent findings. Patient reevaluated and updated on the results of her workup. She was eager to be discharged home. Strict return precautions were provided. Patient was discharged stable condition.   Lab Results  Component Value Date   NA 142 12/12/2022   K 3.7 12/12/2022   CO2 29 12/12/2022   GLUCOSE 105 (H) 12/12/2022   BUN 12 12/12/2022   CREATININE 0.52 12/12/2022   CALCIUM 9.5 12/12/2022   GFR 88.05 12/01/2022   GFRNONAA >60 12/12/2022   Lab Results  Component Value Date   ALT 21 12/12/2022   AST 15 12/12/2022   ALKPHOS 70 12/12/2022   BILITOT 0.9 12/12/2022   Lab Results  Component Value Date   DDIMER 0.88 (H) 12/12/2022   Lab Results  Component Value Date   LIPASE 37 12/12/2022   Troponin 2 Lab Results  Component Value Date   WBC 2.5 (L) 12/12/2022   HGB 12.6 12/12/2022   HCT 38.2 12/12/2022   MCV 93.4 12/12/2022   PLT 234 12/12/2022   Was given ketorolac 15 mg  Also lidocaine patch   Neck/head CTA MPRESSION: 1. No evidence of dissection. 2. No emergent large vessel occlusion. 3. Moderate right M1 MCA stenosis.  IMPRESSION: 1. No acute  vascular or nonvascular findings within the chest, abdomen or pelvis. Specifically, no CTA evidence of acute aortic dissection. 2. Severe burden of sigmoid diverticulosis. Additional incidental, chronic and senescent findings as above.  Neck is a lot better now  Thinks in retrospect she overdid the PT exercise for her left hand   Lidocaine patch helped  Better now   This was not similar to her prior neck pain     No abdominal symptoms    Patient Active Problem List   Diagnosis Date Noted   Elevated lipase 12/01/2022   Microscopic hematuria 09/29/2022   Prediabetes 09/21/2022   Wrist fracture, left 09/21/2022   Elevated serum GGT level 09/21/2022   Abnormal urinalysis 09/21/2022   Fall 09/05/2022   Hematuria 09/05/2022   Left wrist pain 08/03/2022   Scalp pain 12/02/2020   Neck pain 11/20/2020   Insomnia 11/05/2020   Right knee pain 11/22/2019   Elevated TSH 12/14/2015   Vitamin D deficiency 12/06/2015   GERD (gastroesophageal reflux disease) 06/09/2014   Encounter for routine gynecological examination 03/03/2014   Estrogen deficiency 03/03/2014   Heartburn 03/01/2013   Encounter for gynecological examination 06/22/2010   Special screening for malignant neoplasms, colon 06/22/2010   Glaucoma suspect 06/22/2010   Routine general medical examination at a health  care facility 06/03/2010   Sleep apnea    Osteoporosis 05/20/2009   Sleep apnea 10/17/2008   COUGH VARIANT ASTHMA 07/30/2008   Leukocytopenia 01/29/2008   Controlled type 2 diabetes mellitus without complication, without long-term current use of insulin (HCC) 11/27/2007   Hyperlipidemia associated with type 2 diabetes mellitus (HCC) 03/21/2007   Essential hypertension 03/21/2007   Sinusitis, chronic 03/21/2007   Allergic rhinitis 03/21/2007   Diverticulosis of colon 03/21/2007   Past Medical History:  Diagnosis Date   Allergic rhinitis    Arthritis    hip   Borderline high cholesterol    Cataract     Cough variant asthma    Diverticulosis of colon    GERD (gastroesophageal reflux disease)    HTN (hypertension)    Hyperglycemia    borderline DM   OP (osteoporosis)    Seasonal allergies    Sleep apnea    no cpap   Past Surgical History:  Procedure Laterality Date   COLONOSCOPY     COLPOSCOPY     ESOPHAGOGASTRODUODENOSCOPY  10/01   Negative   ROTATOR CUFF REPAIR     right   TUBAL LIGATION     Social History   Tobacco Use   Smoking status: Never   Smokeless tobacco: Never   Tobacco comments:    Remote 2nd hand exposure  Vaping Use   Vaping status: Never Used  Substance Use Topics   Alcohol use: No    Alcohol/week: 0.0 standard drinks of alcohol   Drug use: No   Family History  Problem Relation Age of Onset   Hypertension Father    Diabetes Father    Diabetes Brother    Diabetes Brother    Cancer Brother        prostate   Cancer Brother        prostate   Cancer Brother        prostate   Cancer Brother        prostate   Cancer Brother        prostate   Cancer Brother        prostate   Breast cancer Maternal Aunt    Colon cancer Neg Hx    Rectal cancer Neg Hx    Stomach cancer Neg Hx    Esophageal cancer Neg Hx    Allergies  Allergen Reactions   Augmentin [Amoxicillin-Pot Clavulanate]     GI upset    Sulfonamide Derivatives Rash   Current Outpatient Medications on File Prior to Visit  Medication Sig Dispense Refill   albuterol (PROVENTIL HFA) 108 (90 Base) MCG/ACT inhaler INHALE 2 PUFFS EVERY 4 HOURS AS NEEDED 18 g 3   alendronate (FOSAMAX) 70 MG tablet TAKE 1 TABLET EVERY 7 DAYS WITH A FULL GLASS OF WATER ON AN EMPTY STOMACH 12 tablet 3   amLODipine (NORVASC) 5 MG tablet TAKE 1 TABLET DAILY 90 tablet 1   Calcium Carb-Cholecalciferol 600-800 MG-UNIT TABS Take by mouth.     fluticasone (FLONASE) 50 MCG/ACT nasal spray Place 1 spray into both nostrils daily. 48 g 0   fluticasone (FLOVENT HFA) 44 MCG/ACT inhaler USE 2 INHALATIONS TWICE A DAY (RINSE  MOUTH AFTER USE) 31.8 g 3   glucose blood (FREESTYLE LITE) test strip To check glucose once daily (dx. R73.03) 100 each 1   lidocaine (LIDODERM) 5 % Place 1 patch onto the skin every 12 (twelve) hours. Remove & Discard patch within 12 hours or as directed by MD 10 patch 0  MAXITROL 3.5-10000-0.1 OINT Apply to eye.     metFORMIN (GLUCOPHAGE-XR) 500 MG 24 hr tablet TAKE 1 TABLET DAILY WITH BREAKFAST 90 tablet 1   montelukast (SINGULAIR) 10 MG tablet TAKE 1 TABLET AT BEDTIME 90 tablet 1   olmesartan-hydrochlorothiazide (BENICAR HCT) 40-12.5 MG tablet TAKE 1 TABLET DAILY 90 tablet 1   Omega-3 Fatty Acids (FISH OIL PO) Take 1 capsule by mouth daily.     ROCKLATAN 0.02-0.005 % SOLN      rosuvastatin (CRESTOR) 5 MG tablet Take one pill by mouth twice weekly 24 tablet 2   No current facility-administered medications on file prior to visit.    Review of Systems  Constitutional:  Negative for activity change, appetite change, fatigue, fever and unexpected weight change.  HENT:  Negative for congestion, ear pain, rhinorrhea, sinus pressure and sore throat.   Eyes:  Negative for pain, redness and visual disturbance.  Respiratory:  Negative for cough, shortness of breath and wheezing.   Cardiovascular:  Negative for chest pain and palpitations.  Gastrointestinal:  Negative for abdominal pain, blood in stool, constipation and diarrhea.  Endocrine: Negative for polydipsia and polyuria.  Genitourinary:  Negative for dysuria, frequency and urgency.  Musculoskeletal:  Negative for arthralgias, back pain, myalgias, neck pain and neck stiffness.       Hand pain is mproving   Skin:  Negative for pallor and rash.  Allergic/Immunologic: Negative for environmental allergies.  Neurological:  Negative for dizziness, syncope and headaches.  Hematological:  Negative for adenopathy. Does not bruise/bleed easily.  Psychiatric/Behavioral:  Negative for decreased concentration and dysphoric mood. The patient is not  nervous/anxious.        Objective:   Physical Exam Constitutional:      General: She is not in acute distress.    Appearance: Normal appearance. She is well-developed and normal weight. She is not ill-appearing or diaphoretic.  HENT:     Head: Normocephalic and atraumatic.     Mouth/Throat:     Mouth: Mucous membranes are moist.  Eyes:     General: No scleral icterus.    Conjunctiva/sclera: Conjunctivae normal.     Pupils: Pupils are equal, round, and reactive to light.  Neck:     Thyroid: No thyromegaly.     Vascular: No carotid bruit or JVD.     Comments: Normal rom  No tenderness No crepitus  Cardiovascular:     Rate and Rhythm: Normal rate and regular rhythm.     Heart sounds: Normal heart sounds.     No gallop.  Pulmonary:     Effort: Pulmonary effort is normal. No respiratory distress.     Breath sounds: Normal breath sounds. No stridor. No wheezing, rhonchi or rales.  Chest:     Chest wall: No tenderness.  Abdominal:     General: There is no distension or abdominal bruit.     Palpations: Abdomen is soft.  Musculoskeletal:     Cervical back: Normal range of motion and neck supple. No rigidity or tenderness.     Right lower leg: No edema.     Left lower leg: No edema.  Lymphadenopathy:     Cervical: No cervical adenopathy.  Skin:    General: Skin is warm and dry.     Coloration: Skin is not jaundiced or pale.     Findings: No bruising, erythema or rash.  Neurological:     Mental Status: She is alert.     Cranial Nerves: No cranial nerve deficit.  Coordination: Coordination normal.     Deep Tendon Reflexes: Reflexes are normal and symmetric. Reflexes normal.  Psychiatric:        Mood and Affect: Mood normal.           Assessment & Plan:   Problem List Items Addressed This Visit       Musculoskeletal and Integument   Wrist fracture, left    Improving now with PT        Other   Elevated lipase    This is normal now  Lab Results  Component  Value Date   LIPASE 37 12/12/2022   Pt plans to d/w GI and see if she may not need mrcp  No abd symptoms       Neck pain - Primary    Seen in ER on 10/28 for neck pain that radiated to chest  Reviewed hospital records, lab results and studies in detail   Reassuring work up including imaging of neck/body and vascular assessment   Suspect this was msk in origin  Improved with ketorolac and lidocaine patch  May have injured doing PT of hand   Reassuring exam today  Instructed to call if symptoms return

## 2022-12-20 NOTE — Assessment & Plan Note (Signed)
Improving now with PT

## 2022-12-20 NOTE — Assessment & Plan Note (Signed)
This is normal now  Lab Results  Component Value Date   LIPASE 37 12/12/2022   Pt plans to d/w GI and see if she may not need mrcp  No abd symptoms

## 2022-12-20 NOTE — Patient Instructions (Addendum)
Since lipase is down to normal-I think it would be ok to hold off on the MRI  Check in with GI about that     I'm glad you are doing better   If neck pain returns let me know   Take care of yourself

## 2022-12-21 DIAGNOSIS — R3121 Asymptomatic microscopic hematuria: Secondary | ICD-10-CM | POA: Diagnosis not present

## 2022-12-22 DIAGNOSIS — M6281 Muscle weakness (generalized): Secondary | ICD-10-CM | POA: Diagnosis not present

## 2022-12-22 DIAGNOSIS — M799 Soft tissue disorder, unspecified: Secondary | ICD-10-CM | POA: Diagnosis not present

## 2022-12-22 DIAGNOSIS — M25532 Pain in left wrist: Secondary | ICD-10-CM | POA: Diagnosis not present

## 2022-12-22 DIAGNOSIS — M25632 Stiffness of left wrist, not elsewhere classified: Secondary | ICD-10-CM | POA: Diagnosis not present

## 2022-12-27 NOTE — Telephone Encounter (Signed)
Inbound call from patient, following up on message below. Her MRI is scheduled for tomorrow.

## 2022-12-27 NOTE — Telephone Encounter (Signed)
Patient is advised she may cancel MRI at this time. Patient verbalizes understanding. MRI has been cancelled.

## 2022-12-28 ENCOUNTER — Other Ambulatory Visit: Payer: Medicare PPO

## 2023-01-03 DIAGNOSIS — M25632 Stiffness of left wrist, not elsewhere classified: Secondary | ICD-10-CM | POA: Diagnosis not present

## 2023-01-03 DIAGNOSIS — M6281 Muscle weakness (generalized): Secondary | ICD-10-CM | POA: Diagnosis not present

## 2023-01-03 DIAGNOSIS — M799 Soft tissue disorder, unspecified: Secondary | ICD-10-CM | POA: Diagnosis not present

## 2023-01-03 DIAGNOSIS — M25532 Pain in left wrist: Secondary | ICD-10-CM | POA: Diagnosis not present

## 2023-01-05 DIAGNOSIS — M799 Soft tissue disorder, unspecified: Secondary | ICD-10-CM | POA: Diagnosis not present

## 2023-01-05 DIAGNOSIS — M6281 Muscle weakness (generalized): Secondary | ICD-10-CM | POA: Diagnosis not present

## 2023-01-05 DIAGNOSIS — M25632 Stiffness of left wrist, not elsewhere classified: Secondary | ICD-10-CM | POA: Diagnosis not present

## 2023-01-05 DIAGNOSIS — M25532 Pain in left wrist: Secondary | ICD-10-CM | POA: Diagnosis not present

## 2023-01-10 DIAGNOSIS — M6281 Muscle weakness (generalized): Secondary | ICD-10-CM | POA: Diagnosis not present

## 2023-01-10 DIAGNOSIS — M25632 Stiffness of left wrist, not elsewhere classified: Secondary | ICD-10-CM | POA: Diagnosis not present

## 2023-01-10 DIAGNOSIS — M25532 Pain in left wrist: Secondary | ICD-10-CM | POA: Diagnosis not present

## 2023-01-10 DIAGNOSIS — M799 Soft tissue disorder, unspecified: Secondary | ICD-10-CM | POA: Diagnosis not present

## 2023-01-13 DIAGNOSIS — M799 Soft tissue disorder, unspecified: Secondary | ICD-10-CM | POA: Diagnosis not present

## 2023-01-13 DIAGNOSIS — M6281 Muscle weakness (generalized): Secondary | ICD-10-CM | POA: Diagnosis not present

## 2023-01-13 DIAGNOSIS — M25532 Pain in left wrist: Secondary | ICD-10-CM | POA: Diagnosis not present

## 2023-01-13 DIAGNOSIS — M25632 Stiffness of left wrist, not elsewhere classified: Secondary | ICD-10-CM | POA: Diagnosis not present

## 2023-01-16 DIAGNOSIS — M25632 Stiffness of left wrist, not elsewhere classified: Secondary | ICD-10-CM | POA: Diagnosis not present

## 2023-01-16 DIAGNOSIS — M799 Soft tissue disorder, unspecified: Secondary | ICD-10-CM | POA: Diagnosis not present

## 2023-01-16 DIAGNOSIS — M25532 Pain in left wrist: Secondary | ICD-10-CM | POA: Diagnosis not present

## 2023-01-16 DIAGNOSIS — M6281 Muscle weakness (generalized): Secondary | ICD-10-CM | POA: Diagnosis not present

## 2023-01-18 DIAGNOSIS — M6281 Muscle weakness (generalized): Secondary | ICD-10-CM | POA: Diagnosis not present

## 2023-01-18 DIAGNOSIS — M25532 Pain in left wrist: Secondary | ICD-10-CM | POA: Diagnosis not present

## 2023-01-18 DIAGNOSIS — M799 Soft tissue disorder, unspecified: Secondary | ICD-10-CM | POA: Diagnosis not present

## 2023-01-18 DIAGNOSIS — M25632 Stiffness of left wrist, not elsewhere classified: Secondary | ICD-10-CM | POA: Diagnosis not present

## 2023-01-26 DIAGNOSIS — H04123 Dry eye syndrome of bilateral lacrimal glands: Secondary | ICD-10-CM | POA: Diagnosis not present

## 2023-02-16 ENCOUNTER — Other Ambulatory Visit: Payer: Self-pay | Admitting: Family Medicine

## 2023-02-16 DIAGNOSIS — H401131 Primary open-angle glaucoma, bilateral, mild stage: Secondary | ICD-10-CM | POA: Diagnosis not present

## 2023-02-16 DIAGNOSIS — H04123 Dry eye syndrome of bilateral lacrimal glands: Secondary | ICD-10-CM | POA: Diagnosis not present

## 2023-02-20 ENCOUNTER — Other Ambulatory Visit: Payer: Self-pay | Admitting: Family Medicine

## 2023-02-27 DIAGNOSIS — H401131 Primary open-angle glaucoma, bilateral, mild stage: Secondary | ICD-10-CM | POA: Diagnosis not present

## 2023-03-20 ENCOUNTER — Ambulatory Visit (INDEPENDENT_AMBULATORY_CARE_PROVIDER_SITE_OTHER): Payer: Medicare PPO | Admitting: Family Medicine

## 2023-03-20 ENCOUNTER — Encounter: Payer: Self-pay | Admitting: Family Medicine

## 2023-03-20 VITALS — BP 122/74 | HR 95 | Temp 98.2°F | Ht 62.0 in | Wt 128.0 lb

## 2023-03-20 DIAGNOSIS — R040 Epistaxis: Secondary | ICD-10-CM | POA: Diagnosis not present

## 2023-03-20 DIAGNOSIS — J45991 Cough variant asthma: Secondary | ICD-10-CM

## 2023-03-20 DIAGNOSIS — J301 Allergic rhinitis due to pollen: Secondary | ICD-10-CM | POA: Diagnosis not present

## 2023-03-20 MED ORDER — FLUTICASONE PROPIONATE 50 MCG/ACT NA SUSP: 1.0000 | Freq: Every day | NASAL | 1 refills | Status: DC

## 2023-03-20 NOTE — Assessment & Plan Note (Addendum)
A little blood in nasal mucous   (not much congestion and no other symptoms)   No brisk bleeding  Pt uses flonase on and off / has allergic rhinitis  Raw area on septum in left nostril on exam  Otherwise reassuring   Discussed steroid ns - possible keeping area from healing Instructed to  Hold flonase (can re start in spring if no bleeding)/ sent to pharm Use steam for congestion  Try small dab of aquaphor in nostril at bedtime to help heal area Let us know if not improving or if worse

## 2023-03-20 NOTE — Progress Notes (Signed)
Subjective:    Patient ID: Jasmine Rollins, female    DOB: Aug 31, 1950, 73 y.o.   MRN: [id number]  HPI  Wt Readings from Last 3 Encounters:  03/20/23 128 lb (58.1 kg)  12/20/22 127 lb 2 oz (57.7 kg)  12/12/22 120 lb (54.4 kg)   23.41 kg/m  Vitals:   03/20/23 1155  BP: 122/74  Pulse: 95  Temp: 98.2 F (36.8 C)  SpO2: 95%    Pt presents with chronic congestion  Some blood tinged mucous   Has history of allergic rhinitis and chronic sinusitis in past   When she blows it -sees blood  Sure dryness is part of it  Only on the left  Sees some blood in mucous (no brisk nose bleeds)  Usually clear - occational brown tinge in am    No ST or fever or ear pain or sinus pain   House is dry    Flonase - uses it on /off   Sometimes she uses sinus saline lavage  Using a vapor/steam machine a bit       Patient Active Problem List   Diagnosis Date Noted   Epistaxis 03/20/2023   Elevated lipase 12/01/2022   Microscopic hematuria 09/29/2022   Prediabetes 09/21/2022   Wrist fracture, left 09/21/2022   Elevated serum GGT level 09/21/2022   Abnormal urinalysis 09/21/2022   Fall 09/05/2022   Hematuria 09/05/2022   Left wrist pain 08/03/2022   Scalp pain 12/02/2020   Neck pain 11/20/2020   Insomnia 11/05/2020   Right knee pain 11/22/2019   Elevated TSH 12/14/2015   Vitamin D deficiency 12/06/2015   GERD (gastroesophageal reflux disease) 06/09/2014   Encounter for routine gynecological examination 03/03/2014   Estrogen deficiency 03/03/2014   Heartburn 03/01/2013   Encounter for gynecological examination 06/22/2010   Special screening for malignant neoplasms, colon 06/22/2010   Glaucoma suspect 06/22/2010   Routine general medical examination at a health care facility 06/03/2010   Sleep apnea    Osteoporosis 05/20/2009   Sleep apnea 10/17/2008   Cough variant asthma 07/30/2008   Leukocytopenia 01/29/2008   Controlled type 2 diabetes mellitus without complication,  without long-term current use of insulin (HCC) 11/27/2007   Hyperlipidemia associated with type 2 diabetes mellitus (HCC) 03/21/2007   Essential hypertension 03/21/2007   Sinusitis, chronic 03/21/2007   Allergic rhinitis 03/21/2007   Diverticulosis of colon 03/21/2007   Past Medical History:  Diagnosis Date   Allergic rhinitis    Arthritis    hip   Borderline high cholesterol    Cataract    Cough variant asthma    Diverticulosis of colon    GERD (gastroesophageal reflux disease)    HTN (hypertension)    Hyperglycemia    borderline DM   OP (osteoporosis)    Seasonal allergies    Sleep apnea    no cpap   Past Surgical History:  Procedure Laterality Date   COLONOSCOPY     COLPOSCOPY     ESOPHAGOGASTRODUODENOSCOPY  10/01   Negative   ROTATOR CUFF REPAIR     right   TUBAL LIGATION     Social History   Tobacco Use   Smoking status: Never   Smokeless tobacco: Never   Tobacco comments:    Remote 2nd hand exposure  Vaping Use   Vaping status: Never Used  Substance Use Topics   Alcohol use: No    Alcohol/week: 0.0 standard drinks of alcohol   Drug use: No   Family History  Problem  Relation Age of Onset   Hypertension Father    Diabetes Father    Diabetes Brother    Diabetes Brother    Cancer Brother        prostate   Cancer Brother        prostate   Cancer Brother        prostate   Cancer Brother        prostate   Cancer Brother        prostate   Cancer Brother        prostate   Breast cancer Maternal Aunt    Colon cancer Neg Hx    Rectal cancer Neg Hx    Stomach cancer Neg Hx    Esophageal cancer Neg Hx    Allergies  Allergen Reactions   Augmentin [Amoxicillin-Pot Clavulanate]     GI upset    Sulfonamide Derivatives Rash   Current Outpatient Medications on File Prior to Visit  Medication Sig Dispense Refill   albuterol (PROVENTIL HFA) 108 (90 Base) MCG/ACT inhaler INHALE 2 PUFFS EVERY 4 HOURS AS NEEDED 18 g 3   alendronate (FOSAMAX) 70 MG  tablet TAKE 1 TABLET EVERY 7 DAYS WITH A FULL GLASS OF WATER ON AN EMPTY STOMACH 12 tablet 3   amLODipine (NORVASC) 5 MG tablet TAKE 1 TABLET DAILY 90 tablet 1   Calcium Carb-Cholecalciferol 600-800 MG-UNIT TABS Take by mouth.     fluticasone (FLOVENT HFA) 44 MCG/ACT inhaler USE 2 INHALATIONS TWICE A DAY (RINSE MOUTH AFTER USE) 31.8 g 3   glucose blood (FREESTYLE LITE) test strip To check glucose once daily (dx. R73.03) 100 each 1   lidocaine (LIDODERM) 5 % Place 1 patch onto the skin every 12 (twelve) hours. Remove & Discard patch within 12 hours or as directed by MD 10 patch 0   MAXITROL 3.5-10000-0.1 OINT Apply to eye.     metFORMIN (GLUCOPHAGE-XR) 500 MG 24 hr tablet TAKE 1 TABLET DAILY WITH BREAKFAST 90 tablet 1   montelukast (SINGULAIR) 10 MG tablet TAKE 1 TABLET AT BEDTIME 90 tablet 1   olmesartan-hydrochlorothiazide (BENICAR HCT) 40-12.5 MG tablet TAKE 1 TABLET DAILY 90 tablet 3   Omega-3 Fatty Acids (FISH OIL PO) Take 1 capsule by mouth daily.     ROCKLATAN 0.02-0.005 % SOLN      rosuvastatin (CRESTOR) 5 MG tablet TAKE 1 TABLET TWICE A WEEK 24 tablet 0   No current facility-administered medications on file prior to visit.    Review of Systems  Constitutional:  Negative for activity change, appetite change, fatigue, fever and unexpected weight change.  HENT:  Positive for congestion and nosebleeds. Negative for ear pain, postnasal drip, rhinorrhea, sinus pressure and sore throat.        Occational congestion  Not today  Worse in pollen Erasmo Leventhal  Eyes:  Negative for pain, redness and visual disturbance.  Respiratory:  Negative for cough, shortness of breath and wheezing.   Cardiovascular:  Negative for chest pain and palpitations.  Gastrointestinal:  Negative for abdominal pain, blood in stool, constipation and diarrhea.  Endocrine: Negative for polydipsia and polyuria.  Genitourinary:  Negative for dysuria, frequency and urgency.  Musculoskeletal:  Negative for arthralgias, back  pain and myalgias.  Skin:  Negative for pallor and rash.  Allergic/Immunologic: Negative for environmental allergies.  Neurological:  Negative for dizziness, syncope and headaches.  Hematological:  Negative for adenopathy. Does not bruise/bleed easily.  Psychiatric/Behavioral:  Negative for decreased concentration and dysphoric mood. The patient is not nervous/anxious.  Objective:   Physical Exam Constitutional:      General: She is not in acute distress.    Appearance: Normal appearance. She is normal weight. She is not ill-appearing or diaphoretic.  HENT:     Head: Normocephalic and atraumatic.     Right Ear: Tympanic membrane and ear canal normal.     Left Ear: Tympanic membrane and ear canal normal.     Nose: No congestion.     Comments: Left nare is  generally injected with raw looiking spot on septum that is not actively bleeding Both nares are boggy but not congested   No sinus tenderness    Mouth/Throat:     Mouth: Mucous membranes are moist.     Pharynx: No posterior oropharyngeal erythema.  Eyes:     General: No scleral icterus.       Right eye: No discharge.        Left eye: No discharge.     Conjunctiva/sclera: Conjunctivae normal.     Pupils: Pupils are equal, round, and reactive to light.  Cardiovascular:     Rate and Rhythm: Regular rhythm. Tachycardia present.     Heart sounds: Normal heart sounds.  Pulmonary:     Effort: Pulmonary effort is normal. No respiratory distress.     Breath sounds: Normal breath sounds. No wheezing or rales.  Musculoskeletal:     Cervical back: Neck supple.  Lymphadenopathy:     Cervical: No cervical adenopathy.  Skin:    Coloration: Skin is not pale.     Findings: No erythema, lesion or rash.  Neurological:     Mental Status: She is alert.     Cranial Nerves: No cranial nerve deficit.  Psychiatric:        Mood and Affect: Mood normal.           Assessment & Plan:   Problem List Items Addressed This Visit        Respiratory   Cough variant asthma   Pt is candidate for rsv vaccine  Instructed to check in with pharmacy and see if it is covered       Allergic rhinitis   Seasonal mostly  Encouraged to hold flonase right now due to raw area in left nostril and some blood   Try and avoid allergens  Can start flonase back in spring if needed and no more bleeding  Continue singulair if helpful fo rthis and asthma         Other   Epistaxis - Primary   A little blood in nasal mucous   (not much congestion and no other symptoms)   No brisk bleeding  Pt uses flonase on and off / has allergic rhinitis  Raw area on septum in left nostril on exam  Otherwise reassuring   Discussed steroid ns - possible keeping area from healing Instructed to  Hold flonase (can re start in spring if no bleeding)/ sent to pharm Use steam for congestion  Try small dab of aquaphor in nostril at bedtime to help heal area Let us know if not improving or if worse

## 2023-03-20 NOTE — Patient Instructions (Addendum)
You have a raw looking spot in your left nostril-no doubt from dry air  Hold the flonase for now (you can resume in allergy season if nose is not bleeding)   Use a tiny dab of aquaphor over the counter in the nostril at bedtime  Don't blow nose very hard  Be gentle to your nose   Continue the steam/ vapor  Don't use the vics   Update if not starting to improve in a week or if worsening   If your nasal congestion worsens let me know      If you want RSV vaccine -ask at your pharmacy  I think it a good idea

## 2023-03-20 NOTE — Assessment & Plan Note (Signed)
Pt is candidate for rsv vaccine  Instructed to check in with pharmacy and see if it is covered

## 2023-03-20 NOTE — Assessment & Plan Note (Signed)
Seasonal mostly  Encouraged to hold flonase right now due to raw area in left nostril and some blood   Try and avoid allergens  Can start flonase back in spring if needed and no more bleeding  Continue singulair if helpful fo rthis and asthma

## 2023-03-31 DIAGNOSIS — H401131 Primary open-angle glaucoma, bilateral, mild stage: Secondary | ICD-10-CM | POA: Diagnosis not present

## 2023-04-07 ENCOUNTER — Other Ambulatory Visit: Payer: Self-pay | Admitting: Family Medicine

## 2023-04-19 DIAGNOSIS — R3121 Asymptomatic microscopic hematuria: Secondary | ICD-10-CM | POA: Diagnosis not present

## 2023-04-19 DIAGNOSIS — N281 Cyst of kidney, acquired: Secondary | ICD-10-CM | POA: Diagnosis not present

## 2023-04-25 DIAGNOSIS — H401131 Primary open-angle glaucoma, bilateral, mild stage: Secondary | ICD-10-CM | POA: Diagnosis not present

## 2023-05-01 ENCOUNTER — Other Ambulatory Visit: Payer: Self-pay | Admitting: Family Medicine

## 2023-05-01 DIAGNOSIS — Z1231 Encounter for screening mammogram for malignant neoplasm of breast: Secondary | ICD-10-CM

## 2023-05-29 ENCOUNTER — Other Ambulatory Visit: Payer: Self-pay | Admitting: Family Medicine

## 2023-05-29 MED ORDER — OLMESARTAN MEDOXOMIL-HCTZ 40-12.5 MG PO TABS: 1.0000 | ORAL_TABLET | Freq: Every day | ORAL | 0 refills | Status: DC

## 2023-05-29 NOTE — Telephone Encounter (Signed)
 Copied from CRM [id number]. Topic: Clinical - Medication Refill >> May 29, 2023 10:54 AM Georgeann Kindred wrote: Most Recent Primary Care Visit:  Provider: Deri Fleet A  Department: [facility]  Visit Type: ACUTE  Date: 03/20/2023  Would like an emergency refill as she is out and her medication delivery is delayed  Medication: olmesartan-hydrochlorothiazide (BENICAR HCT) 40-12.5 MG tablet  Has the patient contacted their pharmacy? Yes (Agent: If no, request that the patient contact the pharmacy for the refill. If patient does not wish to contact the pharmacy document the reason why and proceed with request.) (Agent: If yes, when and what did the pharmacy advise?) Suggested to call provider and get an emergency refill until her medication is delivered  Is this the correct pharmacy for this prescription? Yes If no, delete pharmacy and type the correct one.  This is the patient's preferred pharmacy:  [facility] [phone] [facility],  - [address] [place] [address] Phone: [phone] Fax: [phone]   Has the prescription been filled recently? No  Is the patient out of the medication? Yes  Has the patient been seen for an appointment in the last year OR does the patient have an upcoming appointment? Yes  Can we respond through MyChart? Yes  Agent: Please be advised that Rx refills may take up to 3 business days. We ask that you follow-up with your pharmacy.

## 2023-06-01 ENCOUNTER — Encounter: Payer: Self-pay | Admitting: Family Medicine

## 2023-06-05 ENCOUNTER — Telehealth: Payer: Self-pay | Admitting: *Deleted

## 2023-06-05 ENCOUNTER — Ambulatory Visit
Admission: RE | Admit: 2023-06-05 | Discharge: 2023-06-05 | Disposition: A | Discharge status: Home / Self Care | Source: Ambulatory Visit | Attending: Family Medicine | Admitting: Family Medicine

## 2023-06-05 DIAGNOSIS — Z1231 Encounter for screening mammogram for malignant neoplasm of breast: Secondary | ICD-10-CM | POA: Diagnosis not present

## 2023-06-05 NOTE — Telephone Encounter (Signed)
 Please schedule fasting labs prior to appt., provider will place orders closer to appt date

## 2023-06-05 NOTE — Telephone Encounter (Signed)
 Copied from CRM [id number]. Topic: Clinical - Request for Lab/Test Order >> Jun 05, 2023  2:54 PM Martinique E wrote: Reason for CRM: Patient questioning if she will need a lab work order put in before her physical on June 20th. Patient would like labs a week before that appointment.

## 2023-06-05 NOTE — Telephone Encounter (Signed)
 LVM for patient to c/b and schedule.

## 2023-06-06 NOTE — Telephone Encounter (Signed)
 Patient scheduled.

## 2023-06-07 DIAGNOSIS — H401131 Primary open-angle glaucoma, bilateral, mild stage: Secondary | ICD-10-CM | POA: Diagnosis not present

## 2023-06-08 ENCOUNTER — Encounter: Payer: Self-pay | Admitting: Family Medicine

## 2023-07-24 ENCOUNTER — Other Ambulatory Visit: Payer: Self-pay | Admitting: Family Medicine

## 2023-07-24 NOTE — Telephone Encounter (Signed)
 1st filled was 10/2018 so will refill once and it will be right at 5 yrs

## 2023-07-30 ENCOUNTER — Telehealth: Payer: Self-pay | Admitting: Family Medicine

## 2023-07-30 DIAGNOSIS — E1169 Type 2 diabetes mellitus with other specified complication: Secondary | ICD-10-CM

## 2023-07-30 DIAGNOSIS — E119 Type 2 diabetes mellitus without complications: Secondary | ICD-10-CM

## 2023-07-30 DIAGNOSIS — M810 Age-related osteoporosis without current pathological fracture: Secondary | ICD-10-CM

## 2023-07-30 DIAGNOSIS — I10 Essential (primary) hypertension: Secondary | ICD-10-CM

## 2023-07-30 DIAGNOSIS — E559 Vitamin D deficiency, unspecified: Secondary | ICD-10-CM

## 2023-07-30 NOTE — Telephone Encounter (Signed)
-----   Message from Gerry Krone sent at 07/24/2023  9:11 AM EDT ----- Regarding: Lab orders for Tue, 6.17.25 Patient is scheduled for CPX labs, please order future labs, Thanks , Anselmo Kings

## 2023-08-01 ENCOUNTER — Other Ambulatory Visit (INDEPENDENT_AMBULATORY_CARE_PROVIDER_SITE_OTHER)

## 2023-08-01 ENCOUNTER — Ambulatory Visit: Payer: Self-pay | Admitting: Family Medicine

## 2023-08-01 DIAGNOSIS — E559 Vitamin D deficiency, unspecified: Secondary | ICD-10-CM | POA: Diagnosis not present

## 2023-08-01 DIAGNOSIS — I10 Essential (primary) hypertension: Secondary | ICD-10-CM | POA: Diagnosis not present

## 2023-08-01 DIAGNOSIS — E785 Hyperlipidemia, unspecified: Secondary | ICD-10-CM

## 2023-08-01 DIAGNOSIS — E119 Type 2 diabetes mellitus without complications: Secondary | ICD-10-CM

## 2023-08-01 DIAGNOSIS — E1169 Type 2 diabetes mellitus with other specified complication: Secondary | ICD-10-CM | POA: Diagnosis not present

## 2023-08-01 LAB — COMPREHENSIVE METABOLIC PANEL WITH GFR
ALT: 15 U/L (ref 0–35)
AST: 16 U/L (ref 0–37)
Albumin: 4.5 g/dL (ref 3.5–5.2)
Alkaline Phosphatase: 84 U/L (ref 39–117)
BUN: 10 mg/dL (ref 6–23)
CO2: 31 meq/L (ref 19–32)
Calcium: 9.9 mg/dL (ref 8.4–10.5)
Chloride: 104 meq/L (ref 96–112)
Creatinine, Ser: 0.7 mg/dL (ref 0.40–1.20)
GFR: 85.44 mL/min (ref >60.00)
Glucose, Bld: 108 mg/dL — ABNORMAL HIGH (ref 70–99)
Potassium: 3.8 meq/L (ref 3.5–5.1)
Sodium: 141 meq/L (ref 135–145)
Total Bilirubin: 0.8 mg/dL (ref 0.2–1.2)
Total Protein: 7.5 g/dL (ref 6.0–8.3)

## 2023-08-01 LAB — MICROALBUMIN / CREATININE URINE RATIO
Creatinine,U: 355.5 mg/dL
Microalb Creat Ratio: 9.6 mg/g (ref 0.0–30.0)
Microalb, Ur: 3.4 mg/dL — ABNORMAL HIGH (ref 0.0–1.9)

## 2023-08-01 LAB — TSH: TSH: 2.88 u[IU]/mL (ref 0.35–5.50)

## 2023-08-01 LAB — CBC WITH DIFFERENTIAL/PLATELET
Basophils Absolute: 0 10*3/uL (ref 0.0–0.1)
Basophils Relative: 0.7 % (ref 0.0–3.0)
Eosinophils Absolute: 0.2 10*3/uL (ref 0.0–0.7)
Eosinophils Relative: 5.1 % — ABNORMAL HIGH (ref 0.0–5.0)
HCT: 37.6 % (ref 36.0–46.0)
Hemoglobin: 12.6 g/dL (ref 12.0–15.0)
Lymphocytes Relative: 49.9 % — ABNORMAL HIGH (ref 12.0–46.0)
Lymphs Abs: 1.6 10*3/uL (ref 0.7–4.0)
MCHC: 33.4 g/dL (ref 30.0–36.0)
MCV: 90.9 fl (ref 78.0–100.0)
Monocytes Absolute: 0.2 10*3/uL (ref 0.1–1.0)
Monocytes Relative: 7.7 % (ref 3.0–12.0)
Neutro Abs: 1.2 10*3/uL — ABNORMAL LOW (ref 1.4–7.7)
Neutrophils Relative %: 36.6 % — ABNORMAL LOW (ref 43.0–77.0)
Platelets: 284 10*3/uL (ref 150.0–400.0)
RBC: 4.13 Mil/uL (ref 3.87–5.11)
RDW: 13.2 % (ref 11.5–15.5)
WBC: 3.2 10*3/uL — ABNORMAL LOW (ref 4.0–10.5)

## 2023-08-01 LAB — LIPID PANEL
Cholesterol: 157 mg/dL (ref 0–200)
HDL: 77.5 mg/dL (ref >39.00)
LDL Cholesterol: 68 mg/dL (ref 0–99)
NonHDL: 79.77
Total CHOL/HDL Ratio: 2
Triglycerides: 60 mg/dL (ref 0.0–149.0)
VLDL: 12 mg/dL (ref 0.0–40.0)

## 2023-08-01 LAB — HEMOGLOBIN A1C: Hgb A1c MFr Bld: 6.4 % (ref 4.6–6.5)

## 2023-08-01 LAB — VITAMIN D 25 HYDROXY (VIT D DEFICIENCY, FRACTURES): VITD: 54.48 ng/mL (ref 30.00–100.00)

## 2023-08-03 ENCOUNTER — Ambulatory Visit (INDEPENDENT_AMBULATORY_CARE_PROVIDER_SITE_OTHER): Payer: TRICARE For Life (TFL)

## 2023-08-03 VITALS — Ht 62.0 in | Wt 122.0 lb

## 2023-08-03 DIAGNOSIS — Z0000 Encounter for general adult medical examination without abnormal findings: Secondary | ICD-10-CM

## 2023-08-03 NOTE — Progress Notes (Addendum)
 Subjective:   Jasmine Rollins is a 73 y.o. who presents for a Medicare Wellness preventive visit.  As a reminder, Annual Wellness Visits don't include a physical exam, and some assessments may be limited, especially if this visit is performed virtually. We may recommend an in-person follow-up visit with your provider if needed.  Visit Complete: Virtual I connected with  Jasmine Rollins on 08/03/23 by a audio enabled telemedicine application and verified that I am speaking with the correct person using two identifiers.  Patient Location: Home  Provider Location: Office/Clinic  I discussed the limitations of evaluation and management by telemedicine. The patient expressed understanding and agreed to proceed.  Vital Signs: Because this visit was a virtual/telehealth visit, some criteria may be missing or patient reported. Any vitals not documented were not able to be obtained and vitals that have been documented are patient reported.  VideoDeclined- This patient declined Librarian, academic. Therefore the visit was completed with audio only.  Persons Participating in Visit: Patient.  AWV Questionnaire: No: Patient Medicare AWV questionnaire was not completed prior to this visit.  Cardiac Risk Factors include: advanced age (>2men, >5 women);dyslipidemia;diabetes mellitus;hypertension     Objective:    Today's Vitals   08/03/23 0933 08/03/23 0935  Weight: 122 lb (55.3 kg)   Height: 5' 2 (1.575 m)   PainSc:  2    Body mass index is 22.31 kg/m.     08/03/2023    9:56 AM 12/12/2022   10:28 AM 09/02/2022   10:00 AM 08/02/2022   10:20 AM 07/23/2022    8:28 AM 07/28/2021   11:01 AM 12/30/2020   11:25 AM  Advanced Directives  Does Patient Have a Medical Advance Directive? No No No Yes No No No  Type of Theme park manager;Living will     Copy of Healthcare Power of Attorney in Chart?    No - copy requested     Would patient  like information on creating a medical advance directive?     No - Patient declined No - Patient declined Yes (MAU/Ambulatory/Procedural Areas - Information given)    Current Medications (verified) Outpatient Encounter Medications as of 08/03/2023  Medication Sig   albuterol  (PROVENTIL  HFA) 108 (90 Base) MCG/ACT inhaler INHALE 2 PUFFS EVERY 4 HOURS AS NEEDED   alendronate  (FOSAMAX ) 70 MG tablet TAKE 1 TABLET EVERY 7 DAYS WITH A FULL GLASS OF WATER ON AN EMPTY STOMACH   amLODipine  (NORVASC ) 5 MG tablet TAKE 1 TABLET DAILY   Calcium  Carb-Cholecalciferol 600-800 MG-UNIT TABS Take by mouth.   dorzolamide (TRUSOPT) 2 % ophthalmic solution SMARTSIG:In Eye(s)   fluticasone  (FLONASE ) 50 MCG/ACT nasal spray Place 1 spray into both nostrils daily. In allergy season   fluticasone  (FLOVENT  HFA) 44 MCG/ACT inhaler USE 2 INHALATIONS TWICE A DAY (RINSE MOUTH AFTER USE)   glucose blood (FREESTYLE LITE) test strip To check glucose once daily (dx. R73.03)   latanoprost (XALATAN) 0.005 % ophthalmic solution SMARTSIG:In Eye(s)   metFORMIN  (GLUCOPHAGE -XR) 500 MG 24 hr tablet TAKE 1 TABLET DAILY WITH BREAKFAST   olmesartan -hydrochlorothiazide (BENICAR  HCT) 40-12.5 MG tablet Take 1 tablet by mouth daily.   Omega-3 Fatty Acids (FISH OIL PO) Take 1 capsule by mouth daily.   rosuvastatin  (CRESTOR ) 5 MG tablet TAKE 1 TABLET TWICE A WEEK   lidocaine  (LIDODERM ) 5 % Place 1 patch onto the skin every 12 (twelve) hours. Remove & Discard patch within 12 hours or as directed by  MD (Patient not taking: Reported on 08/03/2023)   MAXITROL 3.5-10000-0.1 OINT Apply to eye. (Patient not taking: Reported on 08/03/2023)   montelukast  (SINGULAIR ) 10 MG tablet TAKE 1 TABLET AT BEDTIME (Patient not taking: Reported on 08/03/2023)   ROCKLATAN 0.02-0.005 % SOLN  (Patient not taking: Reported on 08/03/2023)   No facility-administered encounter medications on file as of 08/03/2023.    Allergies (verified) Augmentin  [amoxicillin -pot  clavulanate] and Sulfonamide derivatives   History: Past Medical History:  Diagnosis Date   Allergic rhinitis    Arthritis    hip   Borderline high cholesterol    Cataract    Cough variant asthma    Diverticulosis of colon    GERD (gastroesophageal reflux disease)    HTN (hypertension)    Hyperglycemia    borderline DM   OP (osteoporosis)    Seasonal allergies    Sleep apnea    no cpap   Past Surgical History:  Procedure Laterality Date   COLONOSCOPY     COLPOSCOPY     ESOPHAGOGASTRODUODENOSCOPY  10/01   Negative   ROTATOR CUFF REPAIR     right   TUBAL LIGATION     Family History  Problem Relation Age of Onset   Hypertension Father    Diabetes Father    Diabetes Brother    Diabetes Brother    Cancer Brother        prostate   Cancer Brother        prostate   Cancer Brother        prostate   Cancer Brother        prostate   Cancer Brother        prostate   Cancer Brother        prostate   Breast cancer Maternal Aunt    Colon cancer Neg Hx    Rectal cancer Neg Hx    Stomach cancer Neg Hx    Esophageal cancer Neg Hx    Social History   Socioeconomic History   Marital status: Married    Spouse name: Not on file   Number of children: 1   Years of education: Not on file   Highest education level: Not on file  Occupational History   Occupation: Customer Service  Tobacco Use   Smoking status: Never   Smokeless tobacco: Never   Tobacco comments:    Remote 2nd hand exposure  Vaping Use   Vaping status: Never Used  Substance and Sexual Activity   Alcohol use: No    Alcohol/week: 0.0 standard drinks of alcohol   Drug use: No   Sexual activity: Yes  Other Topics Concern   Not on file  Social History Narrative   Not on file   Social Drivers of Health   Financial Resource Strain: Low Risk  (08/03/2023)   Overall Financial Resource Strain (CARDIA)    Difficulty of Paying Living Expenses: Not hard at all  Food Insecurity: No Food Insecurity  (08/03/2023)   Hunger Vital Sign    Worried About Running Out of Food in the Last Year: Never true    Ran Out of Food in the Last Year: Never true  Transportation Needs: No Transportation Needs (08/03/2023)   PRAPARE - Administrator, Civil Service (Medical): No    Lack of Transportation (Non-Medical): No  Physical Activity: Insufficiently Active (08/03/2023)   Exercise Vital Sign    Days of Exercise per Week: 3 days    Minutes of Exercise per Session:  30 min  Stress: No Stress Concern Present (08/03/2023)   [employer] of Occupational Health - Occupational Stress Questionnaire    Feeling of Stress: Not at all  Social Connections: Socially Integrated (08/03/2023)   Social Connection and Isolation Panel    Frequency of Communication with Friends and Family: More than three times a week    Frequency of Social Gatherings with Friends and Family: More than three times a week    Attends Religious Services: More than 4 times per year    Active Member of [employer] or Organizations: Yes    Attends Engineer, structural: More than 4 times per year    Marital Status: Married    Tobacco Counseling Counseling given: Not Answered Tobacco comments: Remote 2nd hand exposure    Clinical Intake:  Pre-visit preparation completed: Yes  Pain : 0-10 Pain Score: 2  Pain Type: Chronic pain Pain Location: Neck Pain Orientation: Posterior Pain Descriptors / Indicators: Aching, Sore Pain Onset: More than a month ago Pain Frequency: Intermittent Pain Relieving Factors: biofreze, aspercreme, Advil if worsens,exercises  Pain Relieving Factors: biofreze, aspercreme, Advil if worsens,exercises  BMI - recorded: 22.41 Nutritional Status: BMI of 19-24  Normal Nutritional Risks: None Diabetes: Yes CBG done?: Yes (BS 99 this am at home) Did pt. bring in CBG monitor from home?: No  Lab Results  Component Value Date   HGBA1C 6.4 08/01/2023   HGBA1C 6.4 03/09/2022   HGBA1C 6.6 (H)  12/02/2021     How often do you need to have someone help you when you read instructions, pamphlets, or other written materials from your doctor or pharmacy?: 1 - Never  Interpreter Needed?: No  Comments: lives with husband Information entered by :: B.Zaylynn Rickett,LPN   Activities of Daily Living     08/03/2023    9:57 AM  In your present state of health, do you have any difficulty performing the following activities:  Hearing? 0  Vision? 0  Difficulty concentrating or making decisions? 0  Walking or climbing stairs? 0  Dressing or bathing? 0  Doing errands, shopping? 0  Preparing Food and eating ? N  Using the Toilet? N  In the past six months, have you accidently leaked urine? N  Do you have problems with loss of bowel control? N  Managing your Medications? N  Managing your Finances? N  Housekeeping or managing your Housekeeping? N    Patient Care Team: [place], Manley Seeds, MD as PCP - Lestine Rathke, MD as Consulting Physician (Ophthalmology) [facility] Ast, [facility] (Inactive) as Pharmacist (Pharmacist)  I have updated your Care Teams any recent Medical Services you may have received from other providers in the past year.     Assessment:   This is a routine wellness examination for Jasmine Rollins.  Hearing/Vision screen Hearing Screening - Comments:: Pt says her hearing is good Vision Screening - Comments:: Pt says her vision is good Dr Angelina Kempf   Goals Addressed             This Visit's Progress    DIET - EAT MORE FRUITS AND VEGETABLES   On track    08/03/23     Patient Stated   On track    08/03/23- I will continue to take medications as prescribed.      Patient Stated   On track    08/03/23-Be more healthy       Depression Screen     08/03/2023    9:51 AM 12/01/2022  9:59 AM 09/21/2022   11:56 AM 09/05/2022    3:06 PM 08/02/2022   10:20 AM 03/11/2022    9:28 AM 09/08/2021    8:34 AM  PHQ 2/9 Scores  PHQ - 2 Score 0 0 0 0 0 0 0  PHQ- 9 Score  0 0 0  2  0    Fall Risk     08/03/2023    9:42 AM 12/01/2022    9:59 AM 09/21/2022   11:56 AM 09/05/2022    3:06 PM 08/02/2022   10:22 AM  Fall Risk   Falls in the past year? 1 1 1 1  0  Number falls in past yr: 0 0 0 0 0  Injury with Fall? 1 1 1 1  0  Risk for fall due to : No Fall Risks History of fall(s) History of fall(s) History of fall(s) No Fall Risks  Follow up Education provided;Falls prevention discussed Falls evaluation completed Falls evaluation completed Falls evaluation completed Falls prevention discussed;Falls evaluation completed    MEDICARE RISK AT HOME:  Medicare Risk at Home Any stairs in or around the home?: Yes If so, are there any without handrails?: Yes Home free of loose throw rugs in walkways, pet beds, electrical cords, etc?: Yes Adequate lighting in your home to reduce risk of falls?: Yes Life alert?: Yes (watch) Use of a cane, walker or w/c?: No Grab bars in the bathroom?: Yes Shower chair or bench in shower?: Yes Elevated toilet seat or a handicapped toilet?: Yes  TIMED UP AND GO:  Was the test performed?  No  Cognitive Function: 6CIT completed    07/27/2020    1:22 PM 11/06/2018   10:02 AM 10/25/2017    8:04 AM 12/09/2015    8:55 AM  MMSE - Mini Mental State Exam  Orientation to time 5 5 5 5    Orientation to Place 5 5 5 5    Registration 3 3 3 3    Attention/ Calculation 5 5 0 0   Recall 3 3 3 3    Language- name 2 objects   0 0   Language- repeat 1 1 1 1   Language- follow 3 step command   3 3   Language- read & follow direction   0 0   Write a sentence   0 0   Copy design   0 0   Total score   20 20      Data saved with a previous flowsheet row definition        08/03/2023    9:59 AM 08/02/2022   10:23 AM 07/28/2021   11:04 AM  6CIT Screen  What Year? 0 points 0 points 0 points  What month? 0 points 0 points 0 points  What time? 0 points 0 points 0 points  Count back from 20 0 points 0 points 0 points  Months in reverse 0 points 0 points 0  points  Repeat phrase 0 points 0 points 0 points  Total Score 0 points 0 points 0 points    Immunizations Immunization History  Administered Date(s) Administered   Fluad Quad(high Dose 65+) 12/10/2019, 12/09/2021   Influenza Whole 11/12/2008, 11/05/2009   Influenza, High Dose Seasonal PF 11/13/2014, 11/03/2017, 10/16/2018, 12/08/2022   Influenza,inj,Quad PF,6+ Mos 11/03/2016   Influenza-Unspecified 11/28/2012, 11/15/2013, 11/03/2016, 11/26/2020   PFIZER(Purple Top)SARS-COV-2 Vaccination 03/27/2019, 07/08/2019, 01/30/2020   Pfizer Covid-19 Vaccine Bivalent Booster 93yrs & up 05/25/2021   Pfizer(Comirnaty)Fall Seasonal Vaccine 12 years and older 11/04/2022   Pneumococcal Conjugate-13 12/09/2015  Pneumococcal Polysaccharide-23 03/19/2009, 01/25/2017   Td 05/15/2001   Tdap 06/22/2010   Zoster Recombinant(Shingrix) 09/15/2019, 11/14/2019   Zoster, Live 08/05/2010    Screening Tests Health Maintenance  Topic Date Due   FOOT EXAM  12/10/2022   OPHTHALMOLOGY EXAM  04/25/2023   DTaP/Tdap/Td (3 - Td or Tdap) 12/01/2023 (Originally 06/21/2020)   COVID-19 Vaccine (6 - 2024-25 season) 12/16/2023 (Originally 05/04/2023)   INFLUENZA VACCINE  09/15/2023   HEMOGLOBIN A1C  01/31/2024   Diabetic kidney evaluation - eGFR measurement  07/31/2024   Diabetic kidney evaluation - Urine ACR  07/31/2024   Medicare Annual Wellness (AWV)  08/02/2024   MAMMOGRAM  06/04/2025   Pneumococcal Vaccine: 50+ Years  Completed   DEXA SCAN  Completed   Hepatitis C Screening  Completed   Zoster Vaccines- Shingrix  Completed   HPV VACCINES  Aged Out   Meningococcal B Vaccine  Aged Out   Colonoscopy  Discontinued    Health Maintenance  Health Maintenance Due  Topic Date Due   FOOT EXAM  12/10/2022   OPHTHALMOLOGY EXAM  04/25/2023   Health Maintenance Items Addressed: Foot Exam TBD in office next week   Additional Screening:  Vision Screening: Recommended annual ophthalmology exams for early detection  of glaucoma and other disorders of the eye. Would you like a referral to an eye doctor? No    Dental Screening: Recommended annual dental exams for proper oral hygiene  Community Resource Referral / Chronic Care Management: CRR required this visit?  No   CCM required this visit?  No   Plan:    I have personally reviewed and noted the following in the patient's chart:   Medical and social history Use of alcohol, tobacco or illicit drugs  Current medications and supplements including opioid prescriptions. Patient is not currently taking opioid prescriptions. Functional ability and status Nutritional status Physical activity Advanced directives List of other physicians Hospitalizations, surgeries, and ER visits in previous 12 months Vitals Screenings to include cognitive, depression, and falls Referrals and appointments  In addition, I have reviewed and discussed with patient certain preventive protocols, quality metrics, and best practice recommendations. A written personalized care plan for preventive services as well as general preventive health recommendations were provided to patient.   Nerissa Bannister, LPN   1/61/0960   After Visit Summary: (MyChart) Due to this being a telephonic visit, the after visit summary with patients personalized plan was offered to patient via MyChart   Notes: Nothing significant to report at this time.

## 2023-08-03 NOTE — Patient Instructions (Signed)
 Ms. Packard , Thank you for taking time out of your busy schedule to complete your Annual Wellness Visit with me. I enjoyed our conversation and look forward to speaking with you again next year. I, as well as your care team,  appreciate your ongoing commitment to your health goals. Please review the following plan we discussed and let me know if I can assist you in the future. Your Game plan/ To Do List     Follow up Visits: Next Medicare AWV with our clinical staff: 08/05/24@ 9:30am televisit   Have you seen your provider in the last 6 months (3 months if uncontrolled diabetes)? Yes Next Office Visit with your provider: 08/04/23  Clinician Recommendations:  Aim for 30 minutes of exercise or brisk walking, 6-8 glasses of water, and 5 servings of fruits and vegetables each day.       This is a list of the screening recommended for you and due dates:  Health Maintenance  Topic Date Due   Complete foot exam   12/10/2022   Eye exam for diabetics  04/25/2023   DTaP/Tdap/Td vaccine (3 - Td or Tdap) 12/01/2023*   COVID-19 Vaccine (6 - 2024-25 season) 12/16/2023*   Flu Shot  09/15/2023   Hemoglobin A1C  01/31/2024   Yearly kidney function blood test for diabetes  07/31/2024   Yearly kidney health urinalysis for diabetes  07/31/2024   Medicare Annual Wellness Visit  08/02/2024   Mammogram  06/04/2025   Pneumococcal Vaccine for age over 59  Completed   DEXA scan (bone density measurement)  Completed   Hepatitis C Screening  Completed   Zoster (Shingles) Vaccine  Completed   HPV Vaccine  Aged Out   Meningitis B Vaccine  Aged Out   Colon Cancer Screening  Discontinued  *Topic was postponed. The date shown is not the original due date.    Advanced directives: (Declined) Advance directive discussed with you today. Even though you declined this today, please call our office should you change your mind, and we can give you the proper paperwork for you to fill out. Advance Care Planning is important  because it:  [x]  Makes sure you receive the medical care that is consistent with your values, goals, and preferences  [x]  It provides guidance to your family and loved ones and reduces their decisional burden about whether or not they are making the right decisions based on your wishes.  Follow the link provided in your after visit summary or read over the paperwork we have mailed to you to help you started getting your Advance Directives in place. If you need assistance in completing these, please reach out to us  so that we can help you!

## 2023-08-04 ENCOUNTER — Ambulatory Visit: Admitting: Family Medicine

## 2023-08-04 ENCOUNTER — Encounter: Payer: Self-pay | Admitting: Family Medicine

## 2023-08-04 VITALS — BP 124/86 | HR 82 | Temp 98.3°F | Ht 62.0 in | Wt 121.4 lb

## 2023-08-04 DIAGNOSIS — D72818 Other decreased white blood cell count: Secondary | ICD-10-CM

## 2023-08-04 DIAGNOSIS — E119 Type 2 diabetes mellitus without complications: Secondary | ICD-10-CM

## 2023-08-04 DIAGNOSIS — I10 Essential (primary) hypertension: Secondary | ICD-10-CM

## 2023-08-04 DIAGNOSIS — Z7984 Long term (current) use of oral hypoglycemic drugs: Secondary | ICD-10-CM | POA: Diagnosis not present

## 2023-08-04 DIAGNOSIS — E785 Hyperlipidemia, unspecified: Secondary | ICD-10-CM | POA: Diagnosis not present

## 2023-08-04 DIAGNOSIS — E1169 Type 2 diabetes mellitus with other specified complication: Secondary | ICD-10-CM

## 2023-08-04 DIAGNOSIS — M810 Age-related osteoporosis without current pathological fracture: Secondary | ICD-10-CM | POA: Diagnosis not present

## 2023-08-04 DIAGNOSIS — Z0000 Encounter for general adult medical examination without abnormal findings: Secondary | ICD-10-CM

## 2023-08-04 DIAGNOSIS — E559 Vitamin D deficiency, unspecified: Secondary | ICD-10-CM | POA: Diagnosis not present

## 2023-08-04 MED ORDER — ROSUVASTATIN CALCIUM 5 MG PO TABS: ORAL_TABLET | ORAL | 3 refills | Status: AC

## 2023-08-04 NOTE — Assessment & Plan Note (Signed)
 Reviewed health habits including diet and exercise and skin cancer prevention Reviewed appropriate screening tests for age  Also reviewed health mt list, fam hx and immunization status , as well as social and family history   See HPI Labs reviewed and ordered Health Maintenance  Topic Date Due   Complete foot exam   12/10/2022   DTaP/Tdap/Td vaccine (3 - Td or Tdap) 12/01/2023*   COVID-19 Vaccine (6 - 2024-25 season) 12/16/2023*   Flu Shot  09/15/2023   Hemoglobin A1C  01/31/2024   Eye exam for diabetics  06/06/2024   Yearly kidney function blood test for diabetes  07/31/2024   Yearly kidney health urinalysis for diabetes  07/31/2024   Medicare Annual Wellness Visit  08/02/2024   Mammogram  06/04/2025   Pneumococcal Vaccine for age over 59  Completed   DEXA scan (bone density measurement)  Completed   Hepatitis C Screening  Completed   Zoster (Shingles) Vaccine  Completed   HPV Vaccine  Aged Out   Meningitis B Vaccine  Aged Out   Colon Cancer Screening  Discontinued  *Topic was postponed. The date shown is not the original due date.    Td due-pt will get at pharmacy  Dexa due in November Discussed fall prevention, supplements and exercise for bone density  PHQ 0

## 2023-08-04 NOTE — Assessment & Plan Note (Signed)
 Disc goals for lipids and reasons to control them Rev last labs with pt Rev low sat fat diet in detail Continues rosuvastatin  5 mg twice weekly  LDLof 68 Good diet

## 2023-08-04 NOTE — Assessment & Plan Note (Signed)
 Wbc is 3.2 Close to baseline No clinical changes

## 2023-08-04 NOTE — Patient Instructions (Addendum)
 Get back into the exercise habit  Add some strength training to your routine, this is important for bone and brain health and can reduce your risk of falls and help your body use insulin properly and regulate weight  Light weights, exercise bands , and internet videos are a good way to start  Yoga (chair or regular), machines , floor exercises or a gym with machines are also good options   Get your tetanus shot at the pharmacy   Bone density test is due in November /December  Call us  a month prior to put the order in   Labs look stable   Keep watching added sugar in your diet   Follow up in 6 months   It is time to stop the alendronate  in September for a break  (drug holiday)

## 2023-08-04 NOTE — Progress Notes (Signed)
 Subjective:    Patient ID: Jasmine Rollins, female    DOB: 03-May-1950, 73 y.o.   MRN: [id number]  HPI  Here for health maintenance exam and to review chronic medical problems   Wt Readings from Last 3 Encounters:  08/04/23 121 lb 6 oz (55.1 kg)  08/03/23 122 lb (55.3 kg)  03/20/23 128 lb (58.1 kg)   22.20 kg/m  Vitals:   08/04/23 0820  BP: 124/86  Pulse: 82  Temp: 98.3 F (36.8 C)  SpO2: 100%    Immunization History  Administered Date(s) Administered   Fluad Quad(high Dose 65+) 12/10/2019, 12/09/2021   Influenza Whole 11/12/2008, 11/05/2009   Influenza, High Dose Seasonal PF 11/13/2014, 11/03/2017, 10/16/2018, 12/08/2022   Influenza,inj,Quad PF,6+ Mos 11/03/2016   Influenza-Unspecified 11/28/2012, 11/15/2013, 11/03/2016, 11/26/2020   PFIZER(Purple Top)SARS-COV-2 Vaccination 03/27/2019, 07/08/2019, 01/30/2020   Pfizer Covid-19 Vaccine Bivalent Booster 22yrs & up 05/25/2021   Pfizer(Comirnaty)Fall Seasonal Vaccine 12 years and older 11/04/2022   Pneumococcal Conjugate-13 12/09/2015   Pneumococcal Polysaccharide-23 03/19/2009, 01/25/2017   Td 05/15/2001   Tdap 06/22/2010   Zoster Recombinant(Shingrix) 09/15/2019, 11/14/2019   Zoster, Live 08/05/2010    Health Maintenance Due  Topic Date Due   FOOT EXAM  12/10/2022    Feels really good    Due for Td   Mammogram 05/2023 Self breast exam-no lumps   Gyn health-no problems    Colon cancer screening  colonoscopy 11/2022   Bone health  Dexa  12/2021  osteoporosis  Alendronate   since 10/2018  Falls- none  Fractures- none  Supplements - D  Last vitamin D  Lab Results  Component Value Date   VD25OH 54.48 08/01/2023    Exercise  Has some weights - has not been using them  Fell off the wagon for exercise - with busy schedule      Mood    08/03/2023    9:51 AM 12/01/2022    9:59 AM 09/21/2022   11:56 AM 09/05/2022    3:06 PM 08/02/2022   10:20 AM  Depression screen PHQ 2/9  Decreased Interest 0 0 0 0  0  Down, Depressed, Hopeless 0 0 0 0 0  PHQ - 2 Score 0 0 0 0 0  Altered sleeping  0 0 0   Tired, decreased energy  0 0 0   Change in appetite  0 0 0   Feeling bad or failure about yourself   0 0 0   Trouble concentrating  0 0 0   Moving slowly or fidgety/restless  0 0 0   Suicidal thoughts  0 0 0   PHQ-9 Score  0 0 0   Difficult doing work/chores  Not difficult at all Not difficult at all Not difficult at all     HTN bp is stable today  No cp or palpitations or headaches or edema  No side effects to medicines  BP Readings from Last 3 Encounters:  08/04/23 124/86  03/20/23 122/74  12/20/22 122/74     Lab Results  Component Value Date   NA 141 08/01/2023   K 3.8 08/01/2023   CO2 31 08/01/2023   GLUCOSE 108 (H) 08/01/2023   BUN 10 08/01/2023   CREATININE 0.70 08/01/2023   CALCIUM  9.9 08/01/2023   GFR 85.44 08/01/2023   GFRNONAA >60 12/12/2022   Amlodipine  5 mg daily  Benicar  hct 40-12.5 mg daily   Diabetes Home sugar results  DM diet  Exercise  Symptoms Lab Results  Component Value Date   HGBA1C  6.4 08/01/2023   HGBA1C 6.4 03/09/2022   HGBA1C 6.6 (H) 12/02/2021   Lab Results  Component Value Date   MICROALBUR 3.4 (H) 08/01/2023   MICROALBUR 1.6 03/09/2022   Metformin  xr 500 mg daily   No problems with medications  Renal protection arb Last eye exam utd   Has really cut down on her sugar    Hyperlipidemia Lab Results  Component Value Date   CHOL 157 08/01/2023   CHOL 152 04/19/2022   CHOL 224 (H) 03/09/2022   Lab Results  Component Value Date   HDL 77.50 08/01/2023   HDL 74.80 04/19/2022   HDL 81.30 03/09/2022   Lab Results  Component Value Date   LDLCALC 68 08/01/2023   LDLCALC 64 04/19/2022   LDLCALC 127 (H) 03/09/2022   Lab Results  Component Value Date   TRIG 60.0 08/01/2023   TRIG 63.0 04/19/2022   TRIG 78.0 03/09/2022   Lab Results  Component Value Date   CHOLHDL 2 08/01/2023   CHOLHDL 2 04/19/2022   CHOLHDL 3 03/09/2022    Lab Results  Component Value Date   LDLDIRECT 106.4 02/26/2013   LDLDIRECT 104.2 09/27/2011   LDLDIRECT 86.7 11/27/2007   Rosuvastatin  5 mg twice weekly -most tolerated LDL at goal    Lab Results  Component Value Date   ALT 15 08/01/2023   AST 16 08/01/2023   ALKPHOS 84 08/01/2023   BILITOT 0.8 08/01/2023     Lab Results  Component Value Date   TSH 2.88 08/01/2023   Lab Results  Component Value Date   WBC 3.2 (L) 08/01/2023   HGB 12.6 08/01/2023   HCT 37.6 08/01/2023   MCV 90.9 08/01/2023   PLT 284.0 08/01/2023     Patient Active Problem List   Diagnosis Date Noted   Elevated lipase 12/01/2022   Microscopic hematuria 09/29/2022   History of wrist fracture 09/21/2022   Elevated serum GGT level 09/21/2022   Hematuria 09/05/2022   Neck pain 11/20/2020   Insomnia 11/05/2020   Vitamin D  deficiency 12/06/2015   GERD (gastroesophageal reflux disease) 06/09/2014   Encounter for routine gynecological examination 03/03/2014   Encounter for gynecological examination 06/22/2010   Glaucoma suspect 06/22/2010   Routine general medical examination at a health care facility 06/03/2010   Sleep apnea    Osteoporosis 05/20/2009   Sleep apnea 10/17/2008   Cough variant asthma 07/30/2008   Leukocytopenia 01/29/2008   Controlled type 2 diabetes mellitus without complication, without long-term current use of insulin (HCC) 11/27/2007   Hyperlipidemia associated with type 2 diabetes mellitus (HCC) 03/21/2007   Essential hypertension 03/21/2007   Sinusitis, chronic 03/21/2007   Allergic rhinitis 03/21/2007   Diverticulosis of colon 03/21/2007   Past Medical History:  Diagnosis Date   Allergic rhinitis    Arthritis    hip   Borderline high cholesterol    Cataract    Cough variant asthma    Diverticulosis of colon    GERD (gastroesophageal reflux disease)    HTN (hypertension)    Hyperglycemia    borderline DM   OP (osteoporosis)    Seasonal allergies    Sleep apnea     no cpap   Past Surgical History:  Procedure Laterality Date   COLONOSCOPY     COLPOSCOPY     ESOPHAGOGASTRODUODENOSCOPY  10/01   Negative   ROTATOR CUFF REPAIR     right   TUBAL LIGATION     Social History   Tobacco Use   Smoking status:  Never   Smokeless tobacco: Never   Tobacco comments:    Remote 2nd hand exposure  Vaping Use   Vaping status: Never Used  Substance Use Topics   Alcohol use: No    Alcohol/week: 0.0 standard drinks of alcohol   Drug use: No   Family History  Problem Relation Age of Onset   Hypertension Father    Diabetes Father    Diabetes Brother    Diabetes Brother    Cancer Brother        prostate   Cancer Brother        prostate   Cancer Brother        prostate   Cancer Brother        prostate   Cancer Brother        prostate   Cancer Brother        prostate   Breast cancer Maternal Aunt    Colon cancer Neg Hx    Rectal cancer Neg Hx    Stomach cancer Neg Hx    Esophageal cancer Neg Hx    Allergies  Allergen Reactions   Augmentin  [Amoxicillin -Pot Clavulanate]     GI upset    Sulfonamide Derivatives Rash   Current Outpatient Medications on File Prior to Visit  Medication Sig Dispense Refill   albuterol  (PROVENTIL  HFA) 108 (90 Base) MCG/ACT inhaler INHALE 2 PUFFS EVERY 4 HOURS AS NEEDED 18 g 3   alendronate  (FOSAMAX ) 70 MG tablet TAKE 1 TABLET EVERY 7 DAYS WITH A FULL GLASS OF WATER ON AN EMPTY STOMACH 12 tablet 0   amLODipine  (NORVASC ) 5 MG tablet TAKE 1 TABLET DAILY 90 tablet 3   Calcium  Carb-Cholecalciferol 600-800 MG-UNIT TABS Take by mouth.     dorzolamide (TRUSOPT) 2 % ophthalmic solution SMARTSIG:In Eye(s)     fluticasone  (FLONASE ) 50 MCG/ACT nasal spray Place 1 spray into both nostrils daily. In allergy season 48 g 1   fluticasone  (FLOVENT  HFA) 44 MCG/ACT inhaler USE 2 INHALATIONS TWICE A DAY (RINSE MOUTH AFTER USE) 31.8 g 3   glucose blood (FREESTYLE LITE) test strip To check glucose once daily (dx. R73.03) 100 each 1    lidocaine  (LIDODERM ) 5 % Place 1 patch onto the skin every 12 (twelve) hours. Remove & Discard patch within 12 hours or as directed by MD 10 patch 0   metFORMIN  (GLUCOPHAGE -XR) 500 MG 24 hr tablet TAKE 1 TABLET DAILY WITH BREAKFAST 90 tablet 3   montelukast  (SINGULAIR ) 10 MG tablet TAKE 1 TABLET AT BEDTIME 90 tablet 1   olmesartan -hydrochlorothiazide (BENICAR  HCT) 40-12.5 MG tablet Take 1 tablet by mouth daily. 30 tablet 0   Omega-3 Fatty Acids (FISH OIL PO) Take 1 capsule by mouth daily.     No current facility-administered medications on file prior to visit.    Review of Systems  Constitutional:  Negative for activity change, appetite change, fatigue, fever and unexpected weight change.  HENT:  Negative for congestion, ear pain, rhinorrhea, sinus pressure and sore throat.   Eyes:  Negative for pain, redness and visual disturbance.  Respiratory:  Negative for cough, shortness of breath and wheezing.   Cardiovascular:  Negative for chest pain and palpitations.  Gastrointestinal:  Negative for abdominal pain, blood in stool, constipation and diarrhea.  Endocrine: Negative for polydipsia and polyuria.  Genitourinary:  Negative for dysuria, frequency and urgency.  Musculoskeletal:  Negative for arthralgias, back pain and myalgias.  Skin:  Negative for pallor and rash.  Allergic/Immunologic: Negative for environmental allergies.  Neurological:  Negative for dizziness, syncope and headaches.  Hematological:  Negative for adenopathy. Does not bruise/bleed easily.  Psychiatric/Behavioral:  Negative for decreased concentration and dysphoric mood. The patient is nervous/anxious.        Objective:   Physical Exam Constitutional:      General: She is not in acute distress.    Appearance: Normal appearance. She is well-developed and normal weight. She is not ill-appearing or diaphoretic.  HENT:     Head: Normocephalic and atraumatic.     Right Ear: Tympanic membrane, ear canal and external ear  normal.     Left Ear: Tympanic membrane, ear canal and external ear normal.     Nose: Nose normal. No congestion.     Mouth/Throat:     Mouth: Mucous membranes are moist.     Pharynx: Oropharynx is clear. No posterior oropharyngeal erythema.   Eyes:     General: No scleral icterus.    Extraocular Movements: Extraocular movements intact.     Conjunctiva/sclera: Conjunctivae normal.     Pupils: Pupils are equal, round, and reactive to light.   Neck:     Thyroid : No thyromegaly.     Vascular: No carotid bruit or JVD.   Cardiovascular:     Rate and Rhythm: Normal rate and regular rhythm.     Pulses: Normal pulses.     Heart sounds: Normal heart sounds.     No gallop.  Pulmonary:     Effort: Pulmonary effort is normal. No respiratory distress.     Breath sounds: Normal breath sounds. No wheezing.     Comments: Good air exch Chest:     Chest wall: No tenderness.  Abdominal:     General: Bowel sounds are normal. There is no distension or abdominal bruit.     Palpations: Abdomen is soft. There is no mass.     Tenderness: There is no abdominal tenderness.     Hernia: No hernia is present.  Genitourinary:    Comments: Breast exam: No mass, nodules, thickening, tenderness, bulging, retraction, inflamation, nipple discharge or skin changes noted.  No axillary or clavicular LA.      Musculoskeletal:        General: No tenderness. Normal range of motion.     Cervical back: Normal range of motion and neck supple. No rigidity. No muscular tenderness.     Right lower leg: No edema.     Left lower leg: No edema.     Comments: No kyphosis   Lymphadenopathy:     Cervical: No cervical adenopathy.   Skin:    General: Skin is warm and dry.     Coloration: Skin is not pale.     Findings: No erythema or rash.   Neurological:     Mental Status: She is alert. Mental status is at baseline.     Cranial Nerves: No cranial nerve deficit.     Motor: No abnormal muscle tone.     Coordination:  Coordination normal.     Gait: Gait normal.     Deep Tendon Reflexes: Reflexes are normal and symmetric. Reflexes normal.   Psychiatric:        Mood and Affect: Mood normal.        Cognition and Memory: Cognition and memory normal.           Assessment & Plan:   Problem List Items Addressed This Visit       Cardiovascular and Mediastinum   Essential hypertension   bp in fair control  at this time  BP Readings from Last 1 Encounters:  08/04/23 124/86   No changes needed Most recent labs reviewed  Disc lifstyle change with low sodium diet and exercise  Plan to continue Amlodipine  5 mg daily  benicar  hct 40-12.5 mg daily         Relevant Medications   rosuvastatin  (CRESTOR ) 5 MG tablet     Endocrine   Hyperlipidemia associated with type 2 diabetes mellitus (HCC)   Disc goals for lipids and reasons to control them Rev last labs with pt Rev low sat fat diet in detail Continues rosuvastatin  5 mg twice weekly  LDLof 68 Good diet       Relevant Medications   rosuvastatin  (CRESTOR ) 5 MG tablet   Controlled type 2 diabetes mellitus without complication, without long-term current use of insulin (HCC)   Lab Results  Component Value Date   HGBA1C 6.4 08/01/2023   HGBA1C 6.4 03/09/2022   HGBA1C 6.6 (H) 12/02/2021   Metformin  xr 500 mg daily  Well controlled  Microalb utd On arb and statin  Eye exam utd Normal foot exam  Follow up 6 mo  Doing well with low glycemic eating       Relevant Medications   rosuvastatin  (CRESTOR ) 5 MG tablet     Musculoskeletal and Integument   Osteoporosis   No falls or fracture   (remote history of wrist fracture)  Will finish 5 y course of alendronate  in sept  Discussed fall prevention, supplements and exercise for bone density   Dexa due in nov Pt will call for order closer to that time         Other   Vitamin D  deficiency   Therapeutic D level at 54.48   Vitamin D  level is therapeutic with current  supplementation Disc importance of this to bone and overall health       Routine general medical examination at a health care facility - Primary   Reviewed health habits including diet and exercise and skin cancer prevention Reviewed appropriate screening tests for age  Also reviewed health mt list, fam hx and immunization status , as well as social and family history   See HPI Labs reviewed and ordered Health Maintenance  Topic Date Due   Complete foot exam   12/10/2022   DTaP/Tdap/Td vaccine (3 - Td or Tdap) 12/01/2023*   COVID-19 Vaccine (6 - 2024-25 season) 12/16/2023*   Flu Shot  09/15/2023   Hemoglobin A1C  01/31/2024   Eye exam for diabetics  06/06/2024   Yearly kidney function blood test for diabetes  07/31/2024   Yearly kidney health urinalysis for diabetes  07/31/2024   Medicare Annual Wellness Visit  08/02/2024   Mammogram  06/04/2025   Pneumococcal Vaccine for age over 69  Completed   DEXA scan (bone density measurement)  Completed   Hepatitis C Screening  Completed   Zoster (Shingles) Vaccine  Completed   HPV Vaccine  Aged Out   Meningitis B Vaccine  Aged Out   Colon Cancer Screening  Discontinued  *Topic was postponed. The date shown is not the original due date.    Td due-pt will get at pharmacy  Dexa due in November Discussed fall prevention, supplements and exercise for bone density  PHQ 0      Leukocytopenia   Wbc is 3.2 Close to baseline No clinical changes

## 2023-08-04 NOTE — Assessment & Plan Note (Signed)
 Therapeutic D level at 54.48   Vitamin D  level is therapeutic with current supplementation Disc importance of this to bone and overall health

## 2023-08-04 NOTE — Assessment & Plan Note (Signed)
 Lab Results  Component Value Date   HGBA1C 6.4 08/01/2023   HGBA1C 6.4 03/09/2022   HGBA1C 6.6 (H) 12/02/2021   Metformin  xr 500 mg daily  Well controlled  Microalb utd On arb and statin  Eye exam utd Normal foot exam  Follow up 6 mo  Doing well with low glycemic eating

## 2023-08-04 NOTE — Assessment & Plan Note (Signed)
 bp in fair control at this time  BP Readings from Last 1 Encounters:  08/04/23 124/86   No changes needed Most recent labs reviewed  Disc lifstyle change with low sodium diet and exercise  Plan to continue Amlodipine  5 mg daily  benicar  hct 40-12.5 mg daily

## 2023-08-04 NOTE — Assessment & Plan Note (Addendum)
 No falls or fracture   (remote history of wrist fracture)  Will finish 5 y course of alendronate  in sept  Discussed fall prevention, supplements and exercise for bone density   Dexa due in nov Pt will call for order closer to that time

## 2023-10-12 ENCOUNTER — Encounter: Payer: Self-pay | Admitting: Family Medicine

## 2023-10-12 MED ORDER — FLUTICASONE PROPIONATE HFA 44 MCG/ACT IN AERO: INHALATION_SPRAY | RESPIRATORY_TRACT | 1 refills | Status: AC

## 2023-10-12 MED ORDER — FLUTICASONE PROPIONATE 50 MCG/ACT NA SUSP: 1.0000 | Freq: Every day | NASAL | 1 refills | Status: AC

## 2023-10-16 ENCOUNTER — Other Ambulatory Visit: Payer: Self-pay | Admitting: Family Medicine

## 2023-10-18 NOTE — Telephone Encounter (Signed)
 1st refilled on 11/09/2018, ? If needs refill or is done with 5 yr course, please advise  CPE was on 08/04/23

## 2023-10-18 NOTE — Telephone Encounter (Signed)
 You are done with your 5 year alendronate  course (if I am wrong let me know)  We can take some time off from it

## 2023-10-19 NOTE — Telephone Encounter (Signed)
 Pt advised an med declined and removed from med list

## 2023-11-08 DIAGNOSIS — H401131 Primary open-angle glaucoma, bilateral, mild stage: Secondary | ICD-10-CM | POA: Diagnosis not present

## 2023-11-08 DIAGNOSIS — H25812 Combined forms of age-related cataract, left eye: Secondary | ICD-10-CM | POA: Diagnosis not present

## 2023-11-08 DIAGNOSIS — H524 Presbyopia: Secondary | ICD-10-CM | POA: Diagnosis not present

## 2023-11-08 DIAGNOSIS — H04123 Dry eye syndrome of bilateral lacrimal glands: Secondary | ICD-10-CM | POA: Diagnosis not present

## 2023-11-08 DIAGNOSIS — H40032 Anatomical narrow angle, left eye: Secondary | ICD-10-CM | POA: Diagnosis not present

## 2023-12-22 DIAGNOSIS — E1122 Type 2 diabetes mellitus with diabetic chronic kidney disease: Secondary | ICD-10-CM | POA: Diagnosis not present

## 2023-12-22 DIAGNOSIS — N182 Chronic kidney disease, stage 2 (mild): Secondary | ICD-10-CM | POA: Diagnosis not present

## 2023-12-22 DIAGNOSIS — I129 Hypertensive chronic kidney disease with stage 1 through stage 4 chronic kidney disease, or unspecified chronic kidney disease: Secondary | ICD-10-CM | POA: Diagnosis not present

## 2023-12-22 DIAGNOSIS — E1136 Type 2 diabetes mellitus with diabetic cataract: Secondary | ICD-10-CM | POA: Diagnosis not present

## 2023-12-22 DIAGNOSIS — Z79899 Other long term (current) drug therapy: Secondary | ICD-10-CM | POA: Diagnosis not present

## 2023-12-22 DIAGNOSIS — M810 Age-related osteoporosis without current pathological fracture: Secondary | ICD-10-CM | POA: Diagnosis not present

## 2023-12-22 DIAGNOSIS — H409 Unspecified glaucoma: Secondary | ICD-10-CM | POA: Diagnosis not present

## 2023-12-22 DIAGNOSIS — E785 Hyperlipidemia, unspecified: Secondary | ICD-10-CM | POA: Diagnosis not present

## 2023-12-22 DIAGNOSIS — J45909 Unspecified asthma, uncomplicated: Secondary | ICD-10-CM | POA: Diagnosis not present

## 2024-01-29 ENCOUNTER — Ambulatory Visit: Payer: Self-pay

## 2024-01-29 NOTE — Telephone Encounter (Signed)
 Next Appt With Family Medicine Dessie Balls, MD) 01/30/2024 at 12:30 PM

## 2024-01-29 NOTE — Telephone Encounter (Signed)
 Will see her tomorrow as planned Agree with ER/UC precautions

## 2024-01-29 NOTE — Telephone Encounter (Signed)
 FYI Only or Action Required?: FYI only for provider: appointment scheduled on tomorrow.  Patient was last seen in primary care on 08/04/2023 by Randeen Laine LABOR, MD.  Called Nurse Triage reporting Finger Injury.  Symptoms began several months ago.  Interventions attempted: Rest, hydration, or home remedies.  Symptoms are: gradually worsening.  Triage Disposition: See Physician Within 24 Hours  Patient/caregiver understands and will follow disposition?: Yes, will follow disposition  Copied from CRM #[id number]. Topic: Clinical - Red Word Triage >> Jan 29, 2024  7:58 AM Harlene ORN wrote: Red Word that prompted transfer to Nurse Triage:  swollen middle finger; hard to bend and painful Reason for Disposition  Finger joint can't be opened (straightened) or closed (bent) completely  (Note: Injured person should be able to do this without assistance.)  Answer Assessment - Initial Assessment Questions 1. MECHANISM: How did the injury happen?      Denies injury 2. ONSET: When did the injury happen? (e.g., minutes, hours ago)      2-3 months 3. LOCATION: What part of the finger is injured? Is the nail damaged?      Middle finger, middle joint in finger 4. APPEARANCE of the INJURY: What does the injury look like?      Denies redness, states hard to bend, states it is darker than other fingers 5. SEVERITY: Can you use the hand normally?  Can you bend your fingers into a ball and then fully open them?     Cannot bend normally 7. PAIN: Is there pain? If Yes, ask: How bad is the pain?  (Scale 0-10; or none, mild, moderate, severe)     Very painful  Hx of arthritis. Denies fever.  Protocols used: Finger Injury-A-AH

## 2024-01-30 ENCOUNTER — Ambulatory Visit (INDEPENDENT_AMBULATORY_CARE_PROVIDER_SITE_OTHER)
Admission: RE | Admit: 2024-01-30 | Discharge: 2024-01-30 | Disposition: A | Source: Ambulatory Visit | Attending: Family Medicine | Admitting: Family Medicine

## 2024-01-30 ENCOUNTER — Ambulatory Visit: Admitting: Family Medicine

## 2024-01-30 ENCOUNTER — Ambulatory Visit: Payer: Self-pay | Admitting: Family Medicine

## 2024-01-30 ENCOUNTER — Encounter: Payer: Self-pay | Admitting: Family Medicine

## 2024-01-30 VITALS — BP 135/78 | HR 77 | Temp 98.6°F | Ht 62.0 in | Wt 126.1 lb

## 2024-01-30 DIAGNOSIS — I10 Essential (primary) hypertension: Secondary | ICD-10-CM | POA: Diagnosis not present

## 2024-01-30 DIAGNOSIS — Z7984 Long term (current) use of oral hypoglycemic drugs: Secondary | ICD-10-CM

## 2024-01-30 DIAGNOSIS — E119 Type 2 diabetes mellitus without complications: Secondary | ICD-10-CM

## 2024-01-30 DIAGNOSIS — M79644 Pain in right finger(s): Secondary | ICD-10-CM | POA: Insufficient documentation

## 2024-01-30 DIAGNOSIS — M810 Age-related osteoporosis without current pathological fracture: Secondary | ICD-10-CM

## 2024-01-30 DIAGNOSIS — M2540 Effusion, unspecified joint: Secondary | ICD-10-CM

## 2024-01-30 LAB — POCT GLYCOSYLATED HEMOGLOBIN (HGB A1C): Hemoglobin A1C: 5.9 % — AB (ref 4.0–5.6)

## 2024-01-30 MED ORDER — PREDNISONE 10 MG PO TABS: ORAL_TABLET | ORAL | 0 refills | Status: AC

## 2024-01-30 NOTE — Assessment & Plan Note (Signed)
 bp in fair control at this time  BP Readings from Last 1 Encounters:  01/30/24 135/78  Blood pressure runs lower at home  No changes needed Most recent labs reviewed  Disc lifstyle change with low sodium diet and exercise  Plan to continue Amlodipine  5 mg daily  benicar  hct 40-12.5 mg daily

## 2024-01-30 NOTE — Progress Notes (Signed)
 Subjective:    Patient ID: Jasmine Rollins, female    DOB: March 04, 1950, 73 y.o.   MRN: [id number]  HPI  Wt Readings from Last 3 Encounters:  01/30/24 126 lb 2 oz (57.2 kg)  08/04/23 121 lb 6 oz (55.1 kg)  08/03/23 122 lb (55.3 kg)   23.07 kg/m  Vitals:   01/30/24 1218 01/30/24 1234  BP: (!) 148/92 135/78  Pulse: 77   Temp: 98.6 F (37 C)   SpO2: 99%     Pt presents for c/o  Finger problem- (middle finger) Per triage note- difficult to bend/stiff , with pain  Osteoporosis  DM2   History of  Osteoporosis OA   Right middle finger  Few months  Middle joint  Does not get stuck No trauma  Some hyperpigmentation around joint-no redness  No warmth that she noticed   Most painful to flex it   Over the counter Tylenol  (arthritis tylenol )  Topical lidocaine  -helps a bit more  Has not used voltaren  gel  Has not tried heat or ice  Has not tried hot water   Does not wear a ring on that finger   Not extremely tender to the touch  Hurts to hit it against something   Sister has RA    DM2 Lab Results  Component Value Date   HGBA1C 5.9 (A) 01/30/2024   HGBA1C 6.4 08/01/2023   HGBA1C 6.4 03/09/2022   Improved more today  Lab Results  Component Value Date   MICROALBUR 3.4 (H) 08/01/2023    Metformin  xr 500 mg daily  Glucose is usually in 90s Nothing above 150   bp is stable today  No cp or palpitations or headaches or edema  No side effects to medicines  BP Readings from Last 3 Encounters:  01/30/24 135/78  08/04/23 124/86  03/20/23 122/74     Lab Results  Component Value Date   NA 141 08/01/2023   K 3.8 08/01/2023   CO2 31 08/01/2023   GLUCOSE 108 (H) 08/01/2023   BUN 10 08/01/2023   CREATININE 0.70 08/01/2023   CALCIUM  9.9 08/01/2023   GFR 85.44 08/01/2023   GFRNONAA >60 12/12/2022   Blood pressure at home is always good    Lab Results  Component Value Date   NA 141 08/01/2023   K 3.8 08/01/2023   CO2 31 08/01/2023   GLUCOSE 108  (H) 08/01/2023   BUN 10 08/01/2023   CREATININE 0.70 08/01/2023   CALCIUM  9.9 08/01/2023   GFR 85.44 08/01/2023   GFRNONAA >60 12/12/2022   Imaging  DG Hand Complete Right Result Date: 01/30/2024 EXAM: 3 OR MORE VIEW(S) XRAY OF THE RIGHT HAND 01/30/2024 01:00:25 PM COMPARISON: None available. CLINICAL HISTORY: right handed patient with new swelling and pain of 3rd finger PIP joint suspect athritis FINDINGS: BONES AND JOINTS: Mild diffuse bone demineralization. Degenerative changes throughout the interphalangeal joints, first and second metacarpophalangeal joints, and intercarpal joints. First carpometacarpal joint. No erosive changes are demonstrated. No acute fracture or dislocation. No malalignment. SOFT TISSUES: Soft tissue swelling about the proximal interphalangeal joint of the third finger. No radiopaque foreign bodies or soft tissue gas. IMPRESSION: 1. Soft tissue swelling about the proximal interphalangeal joint of the third finger, without erosive changes or acute fracture. 2. Degenerative changes throughout the interphalangeal joints, first and second metacarpophalangeal joints, intercarpal joints, and first carpometacarpal joint. Electronically signed by: Elsie Gravely MD 01/30/2024 01:20 PM EST RP Workstation: [id number]      Patient Active Problem List  Diagnosis Date Noted   Finger pain, right 01/30/2024   Elevated lipase 12/01/2022   Microscopic hematuria 09/29/2022   History of wrist fracture 09/21/2022   Elevated serum GGT level 09/21/2022   Hematuria 09/05/2022   Neck pain 11/20/2020   Insomnia 11/05/2020   Vitamin D  deficiency 12/06/2015   GERD (gastroesophageal reflux disease) 06/09/2014   Encounter for routine gynecological examination 03/03/2014   Encounter for gynecological examination 06/22/2010   Glaucoma suspect 06/22/2010   Routine general medical examination at a health care facility 06/03/2010   Sleep apnea    Osteoporosis 05/20/2009   Sleep apnea  10/17/2008   Cough variant asthma 07/30/2008   Leukocytopenia 01/29/2008   Controlled type 2 diabetes mellitus without complication, without long-term current use of insulin (HCC) 11/27/2007   Hyperlipidemia associated with type 2 diabetes mellitus (HCC) 03/21/2007   Essential hypertension 03/21/2007   Sinusitis, chronic 03/21/2007   Allergic rhinitis 03/21/2007   Diverticulosis of colon 03/21/2007   Past Medical History:  Diagnosis Date   Allergic rhinitis    Arthritis    hip   Borderline high cholesterol    Cataract    Cough variant asthma    Diverticulosis of colon    GERD (gastroesophageal reflux disease)    HTN (hypertension)    Hyperglycemia    borderline DM   OP (osteoporosis)    Seasonal allergies    Sleep apnea    no cpap   Past Surgical History:  Procedure Laterality Date   COLONOSCOPY     COLPOSCOPY     ESOPHAGOGASTRODUODENOSCOPY  10/01   Negative   ROTATOR CUFF REPAIR     right   TUBAL LIGATION     Social History[1] Family History  Problem Relation Age of Onset   Hypertension Father    Diabetes Father    Diabetes Brother    Diabetes Brother    Cancer Brother        prostate   Cancer Brother        prostate   Cancer Brother        prostate   Cancer Brother        prostate   Cancer Brother        prostate   Cancer Brother        prostate   Breast cancer Maternal Aunt    Colon cancer Neg Hx    Rectal cancer Neg Hx    Stomach cancer Neg Hx    Esophageal cancer Neg Hx    Allergies[2] Medications Ordered Prior to Encounter[3]  Review of Systems  Constitutional:  Negative for activity change, appetite change, fatigue, fever and unexpected weight change.  HENT:  Negative for congestion, ear pain, rhinorrhea, sinus pressure and sore throat.   Eyes:  Negative for pain, redness and visual disturbance.  Respiratory:  Negative for cough, shortness of breath and wheezing.   Cardiovascular:  Negative for chest pain and palpitations.   Gastrointestinal:  Negative for abdominal pain, blood in stool, constipation and diarrhea.  Endocrine: Negative for polydipsia and polyuria.  Genitourinary:  Negative for dysuria, frequency and urgency.  Musculoskeletal:  Positive for arthralgias. Negative for back pain and myalgias.  Skin:  Negative for pallor and rash.  Allergic/Immunologic: Negative for environmental allergies.  Neurological:  Negative for dizziness, syncope and headaches.  Hematological:  Negative for adenopathy. Does not bruise/bleed easily.  Psychiatric/Behavioral:  Negative for decreased concentration and dysphoric mood. The patient is not nervous/anxious.        Objective:  Physical Exam Constitutional:      General: She is not in acute distress.    Appearance: Normal appearance. She is well-developed and normal weight. She is not ill-appearing or diaphoretic.  HENT:     Head: Normocephalic and atraumatic.  Eyes:     Conjunctiva/sclera: Conjunctivae normal.     Pupils: Pupils are equal, round, and reactive to light.  Neck:     Thyroid : No thyromegaly.     Vascular: No carotid bruit or JVD.  Cardiovascular:     Rate and Rhythm: Normal rate and regular rhythm.     Heart sounds: Normal heart sounds.     No gallop.  Pulmonary:     Effort: Pulmonary effort is normal. No respiratory distress.     Breath sounds: Normal breath sounds. No stridor. No wheezing or rales.  Abdominal:     General: There is no distension or abdominal bruit.     Palpations: Abdomen is soft.  Musculoskeletal:     Cervical back: Normal range of motion and neck supple.     Right lower leg: No edema.     Left lower leg: No edema.     Comments: Right 3rd finger  PIP joint is enlarged/swollen  Skin slightly hyperpigmented (no erythema or warmth)  No triggering  Little to no tenderness Pain restricts full flexion   No other joint changes   Lymphadenopathy:     Cervical: No cervical adenopathy.  Skin:    General: Skin is warm  and dry.     Coloration: Skin is not pale.     Findings: No rash.  Neurological:     Mental Status: She is alert.     Coordination: Coordination normal.     Deep Tendon Reflexes: Reflexes are normal and symmetric. Reflexes normal.  Psychiatric:        Mood and Affect: Mood normal.           Assessment & Plan:   Problem List Items Addressed This Visit       Cardiovascular and Mediastinum   Essential hypertension   bp in fair control at this time  BP Readings from Last 1 Encounters:  01/30/24 135/78  Blood pressure runs lower at home  No changes needed Most recent labs reviewed  Disc lifstyle change with low sodium diet and exercise  Plan to continue Amlodipine  5 mg daily  benicar  hct 40-12.5 mg daily           Endocrine   Controlled type 2 diabetes mellitus without complication, without long-term current use of insulin (HCC)   Lab Results  Component Value Date   HGBA1C 5.9 (A) 01/30/2024   HGBA1C 6.4 08/01/2023   HGBA1C 6.4 03/09/2022  Well controlled  Metformin  xr 500 mg daily  Well controlled  Microalb utd On arb and statin  Eye exam utd Normal foot exam  Follow up 6 mo  Doing well with low glycemic eating       Relevant Orders   POCT glycosylated hemoglobin (Hb A1C) (Completed)     Musculoskeletal and Integument   Osteoporosis   Due for dexa [facility] no longer does them Referral sent to [facility] Pt will call to schedule      Relevant Orders   DG Bone Density     Other   Finger pain, right - Primary   Right middle PIP joint  Swollen , stiff , painful and hard to fully flex No erythema or warmth  Does have family history of RA  Xray ordered - arthritis noted/no fractures or erosive change Taking tylenol  prn  Encouraged to try diclofenac  gel over the counter  Heat/ice (whichever helps more )  Will try prednisone  30 mg taper (side effects discussed) and update  Consider ortho/hand referral if needed         Relevant Orders   DG  Hand Complete Right (Completed)      [1]  Social History Tobacco Use   Smoking status: Never   Smokeless tobacco: Never   Tobacco comments:    Remote 2nd hand exposure  Vaping Use   Vaping status: Never Used  Substance Use Topics   Alcohol use: No    Alcohol/week: 0.0 standard drinks of alcohol   Drug use: No  [2]  Allergies Allergen Reactions   Augmentin  [Amoxicillin -Pot Clavulanate]     GI upset    Sulfonamide Derivatives Rash  [3]  Current Outpatient Medications on File Prior to Visit  Medication Sig Dispense Refill   albuterol  (PROVENTIL  HFA) 108 (90 Base) MCG/ACT inhaler INHALE 2 PUFFS EVERY 4 HOURS AS NEEDED 18 g 3   amLODipine  (NORVASC ) 5 MG tablet TAKE 1 TABLET DAILY 90 tablet 3   Calcium  Carb-Cholecalciferol 600-800 MG-UNIT TABS Take by mouth.     dorzolamide (TRUSOPT) 2 % ophthalmic solution SMARTSIG:In Eye(s)     fluticasone  (FLONASE ) 50 MCG/ACT nasal spray Place 1 spray into both nostrils daily. In allergy season 48 g 1   fluticasone  (FLOVENT  HFA) 44 MCG/ACT inhaler USE 2 INHALATIONS TWICE A DAY (RINSE MOUTH AFTER USE) 31.8 g 1   glucose blood (FREESTYLE LITE) test strip To check glucose once daily (dx. R73.03) 100 each 1   metFORMIN  (GLUCOPHAGE -XR) 500 MG 24 hr tablet TAKE 1 TABLET DAILY WITH BREAKFAST 90 tablet 3   montelukast  (SINGULAIR ) 10 MG tablet TAKE 1 TABLET AT BEDTIME 90 tablet 1   olmesartan -hydrochlorothiazide (BENICAR  HCT) 40-12.5 MG tablet Take 1 tablet by mouth daily. 30 tablet 0   Omega-3 Fatty Acids (FISH OIL PO) Take 1 capsule by mouth daily.     rosuvastatin  (CRESTOR ) 5 MG tablet TAKE 1 TABLET TWICE A WEEK 24 tablet 3   No current facility-administered medications on file prior to visit.

## 2024-01-30 NOTE — Assessment & Plan Note (Signed)
 Due for dexa [facility] no longer does them Referral sent to [facility] Pt will call to schedule

## 2024-01-30 NOTE — Patient Instructions (Addendum)
 Try heat and ice at different times on your finger to see if either helps  Voltaren  gel over the counter up to four times daily may help also   Xray now  We will reach out with result and plan   Avoid activities that hurt the finger   Take care of yourself   A1c today to check on diabetes is doing    You have an order for:  []   3D Mammogram  [x]   Bone Density     Please call for appointment:   []   [facility]  [address] [place] [address]  [phone]  []   [facility]   [address]  [place], [place] [address]  [phone]  []   [facility]      [address] [place], [place]        [phone]         []   [facility]  [address] [place], [place]  [phone]  [x]  [facility]   [address]   [place], [place] [address]  [phone]  []  [facility]  [address] [place], [place] [address] [phone]    Make sure to wear two piece clothing  No lotions powders or deodorants the day of the appointment Make sure to bring picture ID and insurance card.  Bring list of medications you are currently taking including any supplements.   Schedule your screening mammogram through MyChart!   Select [place] imaging sites can now be scheduled through MyChart.  Log into your MyChart account.  Go to Visit (or Appointments if  on mobile App) --> Schedule an  Appointment  Under Select a Reason for Visit choose the Mammogram  Screening option.  Complete the pre-visit questions  and select the time and place that  best fits your schedule

## 2024-01-30 NOTE — Assessment & Plan Note (Addendum)
 Right middle PIP joint  Swollen , stiff , painful and hard to fully flex No erythema or warmth  Does have family history of RA   Xray ordered - arthritis noted/no fractures or erosive change Taking tylenol  prn  Encouraged to try diclofenac  gel over the counter  Heat/ice (whichever helps more )  Will try prednisone  30 mg taper (side effects discussed) and update  Consider ortho/hand referral if needed

## 2024-01-30 NOTE — Assessment & Plan Note (Signed)
 Lab Results  Component Value Date   HGBA1C 5.9 (A) 01/30/2024   HGBA1C 6.4 08/01/2023   HGBA1C 6.4 03/09/2022  Well controlled  Metformin  xr 500 mg daily  Well controlled  Microalb utd On arb and statin  Eye exam utd Normal foot exam  Follow up 6 mo  Doing well with low glycemic eating

## 2024-02-05 ENCOUNTER — Telehealth: Payer: Self-pay | Admitting: *Deleted

## 2024-02-05 MED ORDER — AMLODIPINE BESYLATE 5 MG PO TABS: 5.0000 mg | ORAL_TABLET | Freq: Every day | ORAL | 0 refills | Status: AC

## 2024-02-05 NOTE — Telephone Encounter (Signed)
 Copied from CRM #[id number]. Topic: Clinical - Prescription Issue >> Feb 05, 2024  3:26 PM Robinson H wrote: Reason for CRM: Patient states she needs a 5 day supply of the amLODipine  (NORVASC ) 5 MG tablet called in to the [facility] on file if possible, states she has an order coming from [facility] but mail delivery service so she won't have them for about a week and is currently out of medication  Alaynah [phone]

## 2024-02-12 ENCOUNTER — Ambulatory Visit (INDEPENDENT_AMBULATORY_CARE_PROVIDER_SITE_OTHER)
Admission: RE | Admit: 2024-02-12 | Discharge: 2024-02-12 | Disposition: A | Discharge status: Home / Self Care | Source: Ambulatory Visit | Attending: Family Medicine | Admitting: Family Medicine

## 2024-02-12 DIAGNOSIS — M810 Age-related osteoporosis without current pathological fracture: Secondary | ICD-10-CM

## 2024-02-13 DIAGNOSIS — M2540 Effusion, unspecified joint: Secondary | ICD-10-CM | POA: Insufficient documentation

## 2024-02-13 NOTE — Telephone Encounter (Signed)
 I want to get some labs   Call and make an non fasting lab appointment when you can   Let's make a plan when we get those back   Thanks for letting me know

## 2024-02-14 ENCOUNTER — Other Ambulatory Visit (INDEPENDENT_AMBULATORY_CARE_PROVIDER_SITE_OTHER)

## 2024-02-14 DIAGNOSIS — M2540 Effusion, unspecified joint: Secondary | ICD-10-CM | POA: Diagnosis not present

## 2024-02-14 DIAGNOSIS — M79644 Pain in right finger(s): Secondary | ICD-10-CM | POA: Diagnosis not present

## 2024-02-14 LAB — CBC WITH DIFFERENTIAL/PLATELET
Basophils Absolute: 0 K/uL (ref 0.0–0.1)
Basophils Relative: 0.6 % (ref 0.0–3.0)
Eosinophils Absolute: 0.1 K/uL (ref 0.0–0.7)
Eosinophils Relative: 1.9 % (ref 0.0–5.0)
HCT: 37.5 % (ref 36.0–46.0)
Hemoglobin: 12.4 g/dL (ref 12.0–15.0)
Lymphocytes Relative: 29.1 % (ref 12.0–46.0)
Lymphs Abs: 1.2 K/uL (ref 0.7–4.0)
MCHC: 33.1 g/dL (ref 30.0–36.0)
MCV: 93 fl (ref 78.0–100.0)
Monocytes Absolute: 0.3 K/uL (ref 0.1–1.0)
Monocytes Relative: 7.6 % (ref 3.0–12.0)
Neutro Abs: 2.6 K/uL (ref 1.4–7.7)
Neutrophils Relative %: 60.8 % (ref 43.0–77.0)
Platelets: 296 K/uL (ref 150.0–400.0)
RBC: 4.03 Mil/uL (ref 3.87–5.11)
RDW: 13.2 % (ref 11.5–15.5)
WBC: 4.3 K/uL (ref 4.0–10.5)

## 2024-02-14 LAB — COMPREHENSIVE METABOLIC PANEL WITH GFR
ALT: 34 U/L (ref 3–35)
AST: 26 U/L (ref 5–37)
Albumin: 4.3 g/dL (ref 3.5–5.2)
Alkaline Phosphatase: 91 U/L (ref 39–117)
BUN: 12 mg/dL (ref 6–23)
CO2: 32 meq/L (ref 19–32)
Calcium: 9.7 mg/dL (ref 8.4–10.5)
Chloride: 102 meq/L (ref 96–112)
Creatinine, Ser: 0.68 mg/dL (ref 0.40–1.20)
GFR: 85.72 mL/min
Glucose, Bld: 173 mg/dL — ABNORMAL HIGH (ref 70–99)
Potassium: 3.9 meq/L (ref 3.5–5.1)
Sodium: 140 meq/L (ref 135–145)
Total Bilirubin: 0.6 mg/dL (ref 0.2–1.2)
Total Protein: 7.8 g/dL (ref 6.0–8.3)

## 2024-02-14 LAB — SEDIMENTATION RATE: Sed Rate: 40 mm/h — ABNORMAL HIGH (ref 0–30)

## 2024-02-16 LAB — ANA: Anti Nuclear Antibody (ANA): NEGATIVE

## 2024-02-16 LAB — RHEUMATOID FACTOR: Rheumatoid fact SerPl-aCnc: <10 [IU]/mL

## 2024-02-18 ENCOUNTER — Ambulatory Visit: Payer: Self-pay | Admitting: Family Medicine

## 2024-02-18 DIAGNOSIS — M79644 Pain in right finger(s): Secondary | ICD-10-CM

## 2024-03-05 ENCOUNTER — Other Ambulatory Visit: Payer: Self-pay | Admitting: Family Medicine

## 2024-08-06 ENCOUNTER — Ambulatory Visit
